# Patient Record
Sex: Male | Born: 1950
Health system: Southern US, Community
[De-identification: ages and names within clinical notes are randomized; demographics above are authoritative.]

## PROBLEM LIST (undated history)

## (undated) DIAGNOSIS — F1010 Alcohol abuse, uncomplicated: Secondary | ICD-10-CM

## (undated) DIAGNOSIS — S92302A Fracture of unspecified metatarsal bone(s), left foot, initial encounter for closed fracture: Secondary | ICD-10-CM

## (undated) DIAGNOSIS — I509 Heart failure, unspecified: Secondary | ICD-10-CM

## (undated) DIAGNOSIS — K219 Gastro-esophageal reflux disease without esophagitis: Secondary | ICD-10-CM

## (undated) DIAGNOSIS — L509 Urticaria, unspecified: Secondary | ICD-10-CM

## (undated) DIAGNOSIS — E662 Morbid (severe) obesity with alveolar hypoventilation: Secondary | ICD-10-CM

## (undated) DIAGNOSIS — K429 Umbilical hernia without obstruction or gangrene: Secondary | ICD-10-CM

## (undated) DIAGNOSIS — E538 Deficiency of other specified B group vitamins: Secondary | ICD-10-CM

## (undated) DIAGNOSIS — I4891 Unspecified atrial fibrillation: Secondary | ICD-10-CM

## (undated) DIAGNOSIS — I2781 Cor pulmonale (chronic): Secondary | ICD-10-CM

## (undated) DIAGNOSIS — I10 Essential (primary) hypertension: Secondary | ICD-10-CM

## (undated) DIAGNOSIS — J9611 Chronic respiratory failure with hypoxia: Secondary | ICD-10-CM

## (undated) DIAGNOSIS — M1990 Unspecified osteoarthritis, unspecified site: Secondary | ICD-10-CM

## (undated) DIAGNOSIS — S92402A Displaced unspecified fracture of left great toe, initial encounter for closed fracture: Secondary | ICD-10-CM

## (undated) HISTORY — PX: KNEE SURGERY: SHX244

## (undated) HISTORY — PX: CYSTOSCOPY: SUR368

## (undated) HISTORY — PX: CHOLECYSTECTOMY: SHX55

---

## 2001-05-24 ENCOUNTER — Emergency Department (HOSPITAL_COMMUNITY): Admission: EM | Admit: 2001-05-24 | Discharge: 2001-05-24 | Payer: Self-pay | Admitting: *Deleted

## 2001-12-16 ENCOUNTER — Observation Stay (HOSPITAL_COMMUNITY): Admission: RE | Admit: 2001-12-16 | Discharge: 2001-12-17 | Payer: Self-pay | Admitting: General Surgery

## 2003-11-29 ENCOUNTER — Ambulatory Visit (HOSPITAL_COMMUNITY): Admission: RE | Admit: 2003-11-29 | Discharge: 2003-11-29 | Payer: Self-pay | Admitting: Orthopedic Surgery

## 2007-11-03 ENCOUNTER — Ambulatory Visit: Payer: Self-pay | Admitting: Internal Medicine

## 2007-11-11 ENCOUNTER — Encounter: Payer: Self-pay | Admitting: Internal Medicine

## 2007-11-11 ENCOUNTER — Ambulatory Visit: Payer: Self-pay | Admitting: Internal Medicine

## 2007-11-11 ENCOUNTER — Ambulatory Visit (HOSPITAL_COMMUNITY): Admission: RE | Admit: 2007-11-11 | Discharge: 2007-11-11 | Payer: Self-pay | Admitting: Internal Medicine

## 2010-08-14 NOTE — Op Note (Signed)
NAME:  Frederick Brown, Frederick Brown               ACCOUNT NO.:  [id number]   MEDICAL RECORD NO.:  [id number]          PATIENT TYPE:  AMB   LOCATION:  DAY                           FACILITY:  [facility]   PHYSICIAN:  R. Roetta Sessions, M.D. DATE OF BIRTH:  1950/12/30   DATE OF PROCEDURE:  DATE OF DISCHARGE:                               OPERATIVE REPORT   INDICATIONS FOR PROCEDURE:  A 60 year old morbidly obese gentleman with  1-week history of increased belching, longstanding gastroesophageal  reflux disease symptoms taking omeprazole 20 mg orally daily, change in  bowel habits with some difficulty evacuating with some blood per rectum.  EGD and colonoscopy now being done.  This approach has been discussed  with the patient at length.  Risks, benefits, and alternatives have been  discussed.  Questions answered.  All parties agreeable.   PROCEDURE NOTE:  O2 saturation, blood pressure, pulse, and respirations  were monitored throughout the entire procedure.   CONSCIOUS SEDATION:  Versed 7 mg IV and Demerol 150 mg IV in divided  doses.   INSTRUMENT:  Pentax video chip system.   FINDINGS:  EGD examination tubular esophagus revealed distal esophageal  erosions in the setting of short segment of salmon-colored epithelium  coming up, fluid like, from the EG junction as much as 3 cm up.  Please  see photos.  There was no ring stricture or evidence of neoplasia.  Biopsies of the distal esophagus and the area of the salmon-colored  epithelium were taken for histologic study.  EG junction easily  traversed.  Stomach:  Gastric cavity was emptied, insufflated well with  air.  Thorough examination of the gastric mucosa including retroflexion  at the proximal stomach and esophagogastric junction demonstrated small  hiatal hernia and some milling polypoid antral folds of doubtful  clinical significance.  There was no ulcer or infiltrating process.  Pylorus patent and easily traversed.  Examination of the bulb and  second  portion revealed no abnormalities.   THERAPEUTIC/DIAGNOSTIC MANEUVERS PERFORMED:  As stated above, biopsies  of the salmon-colored epithelium were taken for histologic study.  The  patient was prepared for colonoscopy.  Digital rectal exam revealed no  abnormalities.  Endoscopic findings:  The prep was adequate.  Colon:  Colonic mucosa was surveyed from the rectosigmoid junction through the  left transverse, right colon, appendiceal orifice, ileocecal valve, and  cecum.  These structures were well seen and photographed for the record.  From this level, the scope was slowly and cautiously withdrawn.  All  previously mentioned mucosal surfaces were again seen.  In the base of  the cecum, there was 5-mm polyp which was cold snared, recovered through  the scope.  At the splenic flexure, there was a second similar-size  polyp which was also cold snared.  At 40 cm or mid sigmoid, there was a  ulcerated oozing 1.5 cm pedunculated polyp on a long stalk.  Please see  photos.  This was engaged with a snare and removed cleanly with 1 pass  of hot snare cautery.  This was recovered with a rotten head.  Scope was  reintroduced in  the rectum, advanced up to the polypectomy site, which  again looked really good.  From this level, scope was slowly withdrawn.  No other colonic mucosal site abnormalities were observed.  The scope  was pulled down into the rectum where thorough examination of rectal  mucosa including retroflexion and anteversion demonstrated no  abnormalities.  The patient tolerated the procedure well and was  reactive to endoscopy.   IMPRESSION:  1. EGD.  Salmon-colored epithelium, distal esophagus with leading edge      erosions consistent with erosive reflux esophagitis, probable short      segment Barrett's, status post biopsy.  2. Hiatal hernia.  Polypoid antral folds, doubtful clinical      significance, otherwise normal gastric mucosa.  Normal D1 and D2.  3. Colonoscopy  findings.  Normal rectum, multiple colonic polyps with      the largest being 40 cm (sigmoid) likely cause of the patient's      recent bleeding status post multiple snare polypectomies.   RECOMMENDATIONS:  1. Followup on path.  2. Stop omeprazole, begin Kapidex 60 mg orally daily.  The patient was      given by my office for some free samples.  3. Begin daily Metamucil or Citrucel fiber supplement.  4. Further recommendations to follow.      Jonathon Bellows, M.D.  Electronically Signed     RMR/MEDQ  D:  11/11/2007  T:  11/12/2007  Job:  [id number]   cc:   [name], M.D.  Fax: [phone]

## 2010-08-14 NOTE — H&P (Signed)
NAME:  Frederick Brown, Frederick Brown               ACCOUNT NO.:  [id number]   MEDICAL RECORD NO.:  [id number]          PATIENT TYPE:  AMB   LOCATION:  DAY                           FACILITY:  [facility]   PHYSICIAN:  Frederick Brown, M.D. DATE OF BIRTH:  [birth year]   DATE OF ADMISSION:  DATE OF DISCHARGE:  LH                              HISTORY & PHYSICAL   REFERRING PHYSICIAN:  [name], MD   REASON FOR CONSULTATION:  Reflux, belching, constipation, and  hematochezia.   HISTORY OF PRESENT ILLNESS:  [name] is a pleasant 60-year-  old morbidly obese Caucasian male sent over courtesy by Dr. Mila Brown to  evaluate a 47-month history of increased belching and sensation he is not  evacuating.  He states he has 1-2 bowel movements daily and starts to  have a good bowel movement and it just stops, he has trouble getting the  rest of the day and occasionally sees some blood per rectum and toilet  water with wiping.  He has not had any frank abdominal pain.  He has  reflux symptoms all the time, but they are all fairly well controlled on  omeprazole, which he requires daily.  He has had no symptoms for several  years.  No odynophagia, no dysphagia, early satiety, nausea, or  vomiting.  He has not had any melena.  He also takes Voltaren on a  regular basis for arthritis.  He states he feels more bloated when he  lies on the right side, better when he lays on the left sided at night.  He has never had either his upper or lower GI tract evaluated  previously.   He has a family history of GI neoplasia.   PAST MEDICAL HISTORY:  Significant for:  1. Gastroesophageal reflux disease.  2. Morbid obesity.  3. Osteoarthritis.   PAST SURGERIES:  1. Right knee surgery.  2. Cholecystectomy.   CURRENT MEDICATIONS:  1. Voltaren b.i.d.  2. Omeprazole 20 mg daily.  3. Tylenol Arthritis p.Frederickn.   ALLERGIES:  No known drug allergies.   FAMILY HISTORY:  Mother was succumbed to Frederick Brown spotted  fever at  age 26.  Father is alive in 75 and doing well.  No history of chronic GI  or liver illness.   SOCIAL HISTORY:  The patient is divorced.  He is unemployed at this  present time but has been an Insurance account manager.  No tobacco.  He  does drink beer or vodka 2-3 drinks daily.  No illicit drugs.   REVIEW OF SYSTEMS:  No chest pain and dyspnea on exertion.  No fever,  chills, or night sweats.   PHYSICAL EXAMINATION:  GENERAL:  Morbidly obese 60 year old gentleman.  VITAL SIGNS:  He weighs 475 per his report and was too large to weigh to  be weighed on our scales, height 5 feet 10, temperature 98.8, BP 122/82,  and pulse 96.  SKIN:  Warm and dry.  There is no jaundice.  No continuous stigmata of  chronic liver disease.  HEENT EXAM:  No scleral icterus.  Conjunctivae pink.  Oral cavity no  lesions.  CHEST:  Lungs are clear to auscultation.  CARDIAC:  Regular rate and rhythm without murmur, gallop, or rub.  ABDOMEN:  Nondistended.  Positive bowel sounds, soft.  No obvious masses  or organomegaly.  The abdomen is nontender to palpation.  EXTREMITIES:  He has trace bilateral lower extremity edema.  RECTAL EXAM:  Deferred without colonoscopy.   IMPRESSION:  [name] is a pleasant morbidly obese 60-year-  old gentleman with 67-month history of increased belching and background  setting of long-standing gastroesophageal reflux disease responsive to  once daily omeprazole 20 mg as well as he now has a change in bowel  habits characterized by difficulty evacuating and intermittent blood per  rectum.  I am striking by the fact, he has not had either his lower or  upper GI tract evaluated previously.  I told [name] symptoms are  somewhat nonspecific but no doubt he needs to go ahead and have his  colon checked right off to bed and in the same time, I offered him a  diagnostic EGD.  He is at increased risk for peptic ulcer disease with  ongoing therapy with Voltaren.   He needs to be screened for complicated  gastroesophageal reflux disease as well to this and we offered Mr.  Frederick Brown both an EGD and a colonoscopy in the hospital in the very near  future.  Risks, benefits, alternatives, and limitations have been  reviewed, questions answered.  He is agreeable.  We will set him up as  soon as it can be arranged.  Further recommendations to follow.   I would thank Dr.  for allowing me to see this nice gentleman in  consultations.      Frederick Brown, M.D.  Electronically Signed     RMR/MEDQ  D:  11/03/2007  T:  11/04/2007  Job:  [id number]

## 2010-08-17 NOTE — Op Note (Signed)
NAME:  Frederick Brown, Frederick Brown                         ACCOUNT NO.:  [id number]   MEDICAL RECORD NO.:  [id number]                   PATIENT TYPE:  OIB   LOCATION:  NA                                   FACILITY:  [facility]   PHYSICIAN:  Rodney A. Chaney Malling, M.D.           DATE OF BIRTH:  1951/02/05   DATE OF PROCEDURE:  11/29/2003  DATE OF DISCHARGE:                                 OPERATIVE REPORT   PREOPERATIVE DIAGNOSIS:  Osteoarthritis, right knee with degenerative tears,  medial and lateral meniscus, right knee and tear of anterior cruciate.   POSTOPERATIVE DIAGNOSIS:  Degenerative tears of posterior horn medial and  lateral meniscus; ruptured anterior cruciate ligament, right knee.  Significant tricompartment osteoarthritis, right knee.   OPERATION PERFORMED:  Arthroscopy; debridement medial and lateral meniscus,  right knee.   SURGEON:  Lenard Galloway. Chaney Malling, M.D.   ANESTHESIA:  General.   PATHOLOGY:  With the arthroscope in the knee, a very careful examination of  the knee was undertaken.  The patellofemoral joint was very difficult to  evaluate and the patient was morbidly obese, difficult to move the scope  around. However, good observation of medial and lateral compartments was  seen.  There was complete rupture of the ACL.  In the medial compartment,  there was fraying of the posterior third of the medial meniscus and there  was an area of total loss of articular cartilage over the posterior half of  the medial tibial plateau.  There is fraying and tearing of the posterior  horn as noted above.  On the opposite side of the contact area of the femur,  there was total loss of all articular cartilage with bone-on-bone  articulation.  In the lateral compartment there was fraying and tearing of  posterior horn of lateral meniscus and degenerative changes about the  femoral condyle and tibial plateau.   DESCRIPTION OF PROCEDURE:  The patient was placed on the operating table in  the  supine position.  The right leg was placed in a leg holder.  Entire  right lower extremity prepped with DuraPrep and draped out in the usual  manner.  A two portal technique was used.  The arthroscope was introduced  through the anteromedial and anterolateral portals.  Attention was first  turned to the intercondylar notch area.  There was complete disruption of  the ACL and this was debrided with the intra-articular shaver.  In the  medial compartment there was some fraying and tearing of the posterior horn  of the medial meniscus and through both portals, a series of baskets was  inserted and a torn portion of the meniscus was debrided.  This was followed  up with the intra-articular shaver and this was debrided.  In a similar  manner, attention was then turned to the lateral compartment.  There was  fraying and tearing of the posterior horn of the lateral meniscus.  This was  also  debrided with a series of baskets and this was followed with the intra-  articular shaver.  There was also marked fraying and tearing of articular  cartilage of the weightbearing area of the lateral femoral condyle and  lateral tibial plateau and this was also debrided.  A great deal of time was  spent looking through the knee.  Technically, this procedure went very well.   ADDENDUM:  Preoperatively, the patient understood he had severe  osteoarthritis of the knee and degenerative torn meniscus.  It was explained  that arthroscopic treatment of the osteoarthritis is not possible, but  hopefully debridement of the torn meniscus would give him some partial pain  relief.  The patient understands and accepted and wished to proceed with  surgery.  Questions answered and encouraged.   FOLLOW UP:  1.  Discharged home.  2.  Percocet for pain.  3.  Return to my office on Wednesday.  4.  I will call the patient tonight.  was prepped with DuraPrep and draped out in the usual manner.  A large bulky compressive  dressing was applied.  The patient was then  transferred to the recovery room in excellent condition.  Technically this  procedure went extremely well.   FOLLOW UP:  1.  See me in the office on .  2.  Vicodin for pain.                                               Rodney A. Chaney Malling, M.D.    RAM/MEDQ  D:  11/29/2003  T:  11/30/2003  Job:  [id number]

## 2010-08-17 NOTE — Op Note (Signed)
NAME:  Frederick Brown, Frederick Brown                         ACCOUNT NO.:  [id number]   MEDICAL RECORD NO.:  [id number]                   PATIENT TYPE:  AMB   LOCATION:  DAY                                  FACILITY:  [facility]   PHYSICIAN:  Dalia Heading, M.D.               DATE OF BIRTH:  02-Jan-1951   DATE OF PROCEDURE:  12/16/2001  DATE OF DISCHARGE:                                 OPERATIVE REPORT   PREOPERATIVE DIAGNOSES:  1. Cholecystitis.  2. Cholelithiasis.   POSTOPERATIVE DIAGNOSES:  1. Cholecystitis.  2. Cholelithiasis.   PROCEDURE:  Laparoscopic cholecystectomy.   SURGEON:  Dalia Heading, M.D.   ANESTHESIA:  General endotracheal.   INDICATIONS FOR PROCEDURE:  The patient is a 61 year old morbidly obese  white male who presents with cholecystitis secondary to cholelithiasis.  The  risks and benefits of the procedure including bleeding, infection,  cardiopulmonary difficulties, hepatobiliary injury, and the possibility of  an open procedure were fully explained to the patient, who gave informed  consent.   PROCEDURE:  The patient was placed in the supine position.  After induction  of general endotracheal anesthesia, the abdomen was prepped and draped using  the usual sterile technique with Betadine.   A supraumbilical incision was made down to the fascia.  A Veress needle was  introduced into the abdominal cavity and confirmation of placement was done  using the saline drop test.  The abdomen was then insufflated to 15 mmHg of  pressure.  An 11-mm trocar was introduced into the abdominal cavity under  direct visualization without difficulty.  The patient was placed in reverse  Trendelenburg position and an additional 1-mm trocar was placed in the  epigastric region and 5-mm trocars were placed in the right upper quadrant  and right flank regions.  The liver was inspected and noted to be within  normal limits.  The gallbladder was retracted superiorly and laterally.  The  dissection was begun around the infundibulum of the gallbladder.  The cystic  duct was first identified.  Its junction to the infundibulum fully  identified.  An Endo GIA was used to come across the cystic duct.  The  cystic artery was likewise ligated and divided.  The gallbladder was freed  away from the gallbladder fossa using Bovie electrocautery.  The gallbladder  was delivered through the epigastric trocar site using an EndoCatch bag.  The gallbladder fossa was inspected and no abnormal bleeding or bile leakage  was noted.  Several gallstones were spilled and cannot be retrieved.  Surgicel was placed in the gallbladder fossa.  The subhepatic space as well  as the right hepatic gutter irrigated with normal saline.  All fluid and air  were then evacuated from the abdominal cavity prior to removal of the  trocars.   All wounds were irrigated with normal saline.  All wounds were injected with  plain 5% Sensorcaine.  The supraumbilical  fascia was reapproximated using an  0 Vicryl interrupted suture.  All skin incisions were closed using staples.  Betadine ointment and dry sterile dressings were applied.   All tape and needle counts were correct at the end of the procedure.  The  patient was extubated in the operating room and went back to recovery room  awake and in stable condition.  Complications none. Specimen gallbladder  with stones.  Estimated blood loss minimal.                                                Dalia Heading, M.D.    MAJ/MEDQ  D:  12/16/2001  T:  12/16/2001  Job:  [id number]   cc:   [name], M.D.  [address]  [place]  [place] [address]  Fax: [phone]

## 2010-08-17 NOTE — H&P (Signed)
   NAME:  Frederick Brown, Frederick NO.:  Brown   MEDICAL RECORD NO.:  [id number]                  PATIENT TYPE:   LOCATION:                                       FACILITY:   PHYSICIAN:  Dalia Heading, M.D.               DATE OF BIRTH:  1950-06-08   DATE OF ADMISSION:  12/16/2001  DATE OF DISCHARGE:                                HISTORY & PHYSICAL   CHIEF COMPLAINT:  Cholecystitis secondary to cholelithiasis.   HISTORY OF PRESENT ILLNESS:  The patient is a 60 year old white male who is  referred for evaluation and treatment of cholecystitis secondary to  cholelithiasis.  He has been having indigestion and substernal chest pain.  He was admitted to [facility] last week for further workup.  Cardiac  workup was negative.  His liver enzyme tests were noted to be normal.  Ultrasound of the gallbladder was positive for cholelithiasis.  Hepatobiliary scan revealed no gallbladder filling.  As the patient was  asymptomatic, supposedly, he was discharged home.  No fever, chills, or  jaundice have been noted.  He is still having mild indigestion and reflux.   PAST MEDICAL HISTORY:  1. Morbid obesity.   PAST SURGICAL HISTORY:  Unremarkable.   CURRENT MEDICATIONS:  Voltaren and Protonix.   ALLERGIES:  No known drug allergies   REVIEW OF SYSTEMS:  The patient denies any cardiopulmonary difficulties or  sleep apnea.  He denies any bleeding disorders.   PHYSICAL EXAMINATION:  GENERAL:  Morbidly obese, 450-pound, white male in no  acute distress.  VITAL SIGNS:  Afebrile, and vital signs are stable.  HEENT:  No scleral icterus.  LUNGS:  Clear to auscultation with equal breath sounds bilaterally.  HEART:  Regular rate and rhythm.  Without S3, S4, or murmurs.  ABDOMEN:  Soft and nondistended.  Slightly tender in the right upper  quadrant to palpation.  No hepatosplenomegaly, masses, or hernias are  identified.   IMPRESSION:  1. Cholecystitis.  2.  Cholelithiasis.  3. Morbid obesity.    PLAN:  The patient is scheduled for laparoscopic cholecystectomy on  December 16, 2001.  The risks and benefits of the procedure including  bleeding, infection, hepatobiliary injury, poor wound healing,  cardiopulmonary difficulties, and the possibility of an open procedure were  fully explained to the patient, who gave informed consent.                                                  Dalia Heading, M.D.    MAJ/MEDQ  D:  12/10/2001  T:  12/11/2001  Job:  [id number]   cc:   Frederick Brown, M.D.  [address]  [place]  [place] [address]  Fax: [phone]

## 2011-05-03 DIAGNOSIS — I2781 Cor pulmonale (chronic): Secondary | ICD-10-CM

## 2011-05-03 DIAGNOSIS — E662 Morbid (severe) obesity with alveolar hypoventilation: Secondary | ICD-10-CM

## 2011-05-03 HISTORY — DX: Morbid (severe) obesity with alveolar hypoventilation: E66.2

## 2011-05-03 HISTORY — DX: Cor pulmonale (chronic): I27.81

## 2011-05-21 ENCOUNTER — Encounter (HOSPITAL_COMMUNITY): Payer: Self-pay | Admitting: *Deleted

## 2011-05-21 ENCOUNTER — Other Ambulatory Visit: Payer: Self-pay

## 2011-05-21 ENCOUNTER — Emergency Department (HOSPITAL_COMMUNITY): Payer: Self-pay

## 2011-05-21 ENCOUNTER — Inpatient Hospital Stay (HOSPITAL_COMMUNITY)
Admission: EM | Admit: 2011-05-21 | Discharge: 2011-05-31 | DRG: 292 | Disposition: A | Discharge status: Skilled Nursing Facility | Payer: Self-pay | Attending: Internal Medicine | Admitting: Internal Medicine

## 2011-05-21 DIAGNOSIS — K219 Gastro-esophageal reflux disease without esophagitis: Secondary | ICD-10-CM | POA: Diagnosis present

## 2011-05-21 DIAGNOSIS — I10 Essential (primary) hypertension: Secondary | ICD-10-CM | POA: Diagnosis present

## 2011-05-21 DIAGNOSIS — M109 Gout, unspecified: Secondary | ICD-10-CM | POA: Diagnosis present

## 2011-05-21 DIAGNOSIS — L500 Allergic urticaria: Secondary | ICD-10-CM | POA: Diagnosis not present

## 2011-05-21 DIAGNOSIS — I4891 Unspecified atrial fibrillation: Secondary | ICD-10-CM | POA: Diagnosis present

## 2011-05-21 DIAGNOSIS — B369 Superficial mycosis, unspecified: Secondary | ICD-10-CM | POA: Diagnosis present

## 2011-05-21 DIAGNOSIS — X58XXXA Exposure to other specified factors, initial encounter: Secondary | ICD-10-CM | POA: Diagnosis present

## 2011-05-21 DIAGNOSIS — J9610 Chronic respiratory failure, unspecified whether with hypoxia or hypercapnia: Secondary | ICD-10-CM | POA: Diagnosis present

## 2011-05-21 DIAGNOSIS — I5031 Acute diastolic (congestive) heart failure: Principal | ICD-10-CM | POA: Diagnosis present

## 2011-05-21 DIAGNOSIS — E871 Hypo-osmolality and hyponatremia: Secondary | ICD-10-CM | POA: Diagnosis present

## 2011-05-21 DIAGNOSIS — B372 Candidiasis of skin and nail: Secondary | ICD-10-CM | POA: Diagnosis present

## 2011-05-21 DIAGNOSIS — E662 Morbid (severe) obesity with alveolar hypoventilation: Secondary | ICD-10-CM | POA: Diagnosis present

## 2011-05-21 DIAGNOSIS — J9611 Chronic respiratory failure with hypoxia: Secondary | ICD-10-CM | POA: Diagnosis present

## 2011-05-21 DIAGNOSIS — M1990 Unspecified osteoarthritis, unspecified site: Secondary | ICD-10-CM | POA: Diagnosis present

## 2011-05-21 DIAGNOSIS — N390 Urinary tract infection, site not specified: Secondary | ICD-10-CM | POA: Diagnosis not present

## 2011-05-21 DIAGNOSIS — E538 Deficiency of other specified B group vitamins: Secondary | ICD-10-CM | POA: Diagnosis present

## 2011-05-21 DIAGNOSIS — I2781 Cor pulmonale (chronic): Secondary | ICD-10-CM | POA: Diagnosis present

## 2011-05-21 DIAGNOSIS — L509 Urticaria, unspecified: Secondary | ICD-10-CM | POA: Diagnosis not present

## 2011-05-21 DIAGNOSIS — T368X5A Adverse effect of other systemic antibiotics, initial encounter: Secondary | ICD-10-CM | POA: Diagnosis not present

## 2011-05-21 DIAGNOSIS — M79609 Pain in unspecified limb: Secondary | ICD-10-CM | POA: Diagnosis present

## 2011-05-21 DIAGNOSIS — Z79899 Other long term (current) drug therapy: Secondary | ICD-10-CM

## 2011-05-21 DIAGNOSIS — E669 Obesity, unspecified: Secondary | ICD-10-CM

## 2011-05-21 DIAGNOSIS — S92302A Fracture of unspecified metatarsal bone(s), left foot, initial encounter for closed fracture: Secondary | ICD-10-CM | POA: Diagnosis present

## 2011-05-21 DIAGNOSIS — S92402A Displaced unspecified fracture of left great toe, initial encounter for closed fracture: Secondary | ICD-10-CM | POA: Diagnosis present

## 2011-05-21 DIAGNOSIS — F1010 Alcohol abuse, uncomplicated: Secondary | ICD-10-CM | POA: Diagnosis present

## 2011-05-21 DIAGNOSIS — S92919A Unspecified fracture of unspecified toe(s), initial encounter for closed fracture: Secondary | ICD-10-CM | POA: Diagnosis present

## 2011-05-21 DIAGNOSIS — E559 Vitamin D deficiency, unspecified: Secondary | ICD-10-CM | POA: Diagnosis present

## 2011-05-21 DIAGNOSIS — R5381 Other malaise: Secondary | ICD-10-CM | POA: Diagnosis present

## 2011-05-21 DIAGNOSIS — R601 Generalized edema: Secondary | ICD-10-CM

## 2011-05-21 DIAGNOSIS — E873 Alkalosis: Secondary | ICD-10-CM | POA: Diagnosis not present

## 2011-05-21 DIAGNOSIS — G8929 Other chronic pain: Secondary | ICD-10-CM | POA: Diagnosis present

## 2011-05-21 DIAGNOSIS — S92309A Fracture of unspecified metatarsal bone(s), unspecified foot, initial encounter for closed fracture: Secondary | ICD-10-CM | POA: Diagnosis present

## 2011-05-21 DIAGNOSIS — J9801 Acute bronchospasm: Secondary | ICD-10-CM | POA: Diagnosis present

## 2011-05-21 DIAGNOSIS — D7589 Other specified diseases of blood and blood-forming organs: Secondary | ICD-10-CM | POA: Diagnosis present

## 2011-05-21 DIAGNOSIS — E870 Hyperosmolality and hypernatremia: Secondary | ICD-10-CM | POA: Diagnosis present

## 2011-05-21 DIAGNOSIS — I509 Heart failure, unspecified: Secondary | ICD-10-CM | POA: Diagnosis present

## 2011-05-21 DIAGNOSIS — Z6841 Body Mass Index (BMI) 40.0 and over, adult: Secondary | ICD-10-CM

## 2011-05-21 DIAGNOSIS — M549 Dorsalgia, unspecified: Secondary | ICD-10-CM | POA: Diagnosis present

## 2011-05-21 HISTORY — DX: Chronic respiratory failure with hypoxia: J96.11

## 2011-05-21 HISTORY — DX: Urticaria, unspecified: L50.9

## 2011-05-21 HISTORY — DX: Deficiency of other specified B group vitamins: E53.8

## 2011-05-21 HISTORY — DX: Displaced unspecified fracture of left great toe, initial encounter for closed fracture: S92.402A

## 2011-05-21 HISTORY — DX: Fracture of unspecified metatarsal bone(s), left foot, initial encounter for closed fracture: S92.302A

## 2011-05-21 HISTORY — DX: Morbid (severe) obesity with alveolar hypoventilation: E66.2

## 2011-05-21 HISTORY — DX: Morbid (severe) obesity due to excess calories: E66.01

## 2011-05-21 HISTORY — DX: Alcohol abuse, uncomplicated: F10.10

## 2011-05-21 HISTORY — DX: Unspecified osteoarthritis, unspecified site: M19.90

## 2011-05-21 HISTORY — DX: Essential (primary) hypertension: I10

## 2011-05-21 HISTORY — DX: Umbilical hernia without obstruction or gangrene: K42.9

## 2011-05-21 HISTORY — DX: Gastro-esophageal reflux disease without esophagitis: K21.9

## 2011-05-21 HISTORY — DX: Cor pulmonale (chronic): I27.81

## 2011-05-21 LAB — BASIC METABOLIC PANEL
Chloride: 107 mEq/L (ref 96–112)
Creatinine, Ser: 0.94 mg/dL (ref 0.50–1.35)
GFR calc Af Amer: >90 mL/min (ref >90)
GFR calc non Af Amer: 90 mL/min — ABNORMAL LOW (ref >90)
Potassium: 4.1 mEq/L (ref 3.5–5.1)

## 2011-05-21 LAB — PRO B NATRIURETIC PEPTIDE: Pro B Natriuretic peptide (BNP): 1490 pg/mL — ABNORMAL HIGH (ref 0–125)

## 2011-05-21 LAB — DIFFERENTIAL
Basophils Absolute: 0 10*3/uL (ref 0.0–0.1)
Basophils Relative: 1 % (ref 0–1)
Eosinophils Absolute: 0.1 10*3/uL (ref 0.0–0.7)
Neutro Abs: 4.1 10*3/uL (ref 1.7–7.7)
Neutrophils Relative %: 74 % (ref 43–77)

## 2011-05-21 LAB — CARDIAC PANEL(CRET KIN+CKTOT+MB+TROPI): Total CK: 112 U/L (ref 7–232)

## 2011-05-21 LAB — GLUCOSE, CAPILLARY: Glucose-Capillary: 94 mg/dL (ref 70–99)

## 2011-05-21 LAB — PROTIME-INR
INR: 0.99 (ref 0.00–1.49)
Prothrombin Time: 13.3 seconds (ref 11.6–15.2)

## 2011-05-21 LAB — CBC
MCHC: 31.2 g/dL (ref 30.0–36.0)
RDW: 16.4 % — ABNORMAL HIGH (ref 11.5–15.5)

## 2011-05-21 LAB — APTT: aPTT: 30 seconds (ref 24–37)

## 2011-05-21 MED ORDER — OXYCODONE HCL 5 MG PO TABS
5.0000 mg | ORAL_TABLET | ORAL | Status: DC | PRN
  Administered 2011-05-21 – 2011-05-23 (×3): 5 mg via ORAL
  Filled 2011-05-21 (×3): qty 1

## 2011-05-21 MED ORDER — FUROSEMIDE 40 MG PO TABS
40.0000 mg | ORAL_TABLET | Freq: Once | ORAL | Status: AC
  Administered 2011-05-21: 40 mg via ORAL
  Filled 2011-05-21: qty 1

## 2011-05-21 MED ORDER — VITAMIN B-1 100 MG PO TABS
100.0000 mg | ORAL_TABLET | Freq: Every day | ORAL | Status: DC
  Administered 2011-05-21 – 2011-05-31 (×11): 100 mg via ORAL
  Filled 2011-05-21 (×11): qty 1

## 2011-05-21 MED ORDER — ASPIRIN EC 81 MG PO TBEC
81.0000 mg | DELAYED_RELEASE_TABLET | Freq: Every day | ORAL | Status: DC
  Administered 2011-05-21 – 2011-05-31 (×11): 81 mg via ORAL
  Filled 2011-05-21 (×11): qty 1

## 2011-05-21 MED ORDER — NYSTATIN 100000 UNIT/GM EX POWD
Freq: Two times a day (BID) | CUTANEOUS | Status: DC
  Administered 2011-05-21 – 2011-05-31 (×19): via TOPICAL
  Filled 2011-05-21: qty 15

## 2011-05-21 MED ORDER — SODIUM CHLORIDE 0.9 % IJ SOLN
3.0000 mL | INTRAMUSCULAR | Status: DC | PRN
  Administered 2011-05-24 (×3): 3 mL via INTRAVENOUS
  Filled 2011-05-21 (×2): qty 3

## 2011-05-21 MED ORDER — PANTOPRAZOLE SODIUM 40 MG PO TBEC
40.0000 mg | DELAYED_RELEASE_TABLET | Freq: Every day | ORAL | Status: DC
  Administered 2011-05-21 – 2011-05-31 (×11): 40 mg via ORAL
  Filled 2011-05-21 (×12): qty 1

## 2011-05-21 MED ORDER — ENOXAPARIN SODIUM 40 MG/0.4ML ~~LOC~~ SOLN
40.0000 mg | SUBCUTANEOUS | Status: DC
  Administered 2011-05-21: 40 mg via SUBCUTANEOUS
  Filled 2011-05-21: qty 0.4

## 2011-05-21 MED ORDER — DILTIAZEM HCL 30 MG PO TABS
30.0000 mg | ORAL_TABLET | Freq: Four times a day (QID) | ORAL | Status: DC
  Administered 2011-05-21 – 2011-05-22 (×6): 30 mg via ORAL
  Filled 2011-05-21 (×6): qty 1

## 2011-05-21 MED ORDER — SODIUM CHLORIDE 0.9 % IV SOLN
250.0000 mL | INTRAVENOUS | Status: DC | PRN
  Administered 2011-05-30: 250 mL via INTRAVENOUS

## 2011-05-21 MED ORDER — ACETAMINOPHEN 325 MG PO TABS: 650.0000 mg | ORAL_TABLET | Freq: Four times a day (QID) | ORAL | Status: DC | PRN

## 2011-05-21 MED ORDER — LEVALBUTEROL HCL 1.25 MG/0.5ML IN NEBU
1.2500 mg | INHALATION_SOLUTION | Freq: Four times a day (QID) | RESPIRATORY_TRACT | Status: DC
  Administered 2011-05-21 – 2011-05-25 (×14): 1.25 mg via RESPIRATORY_TRACT
  Filled 2011-05-21 (×14): qty 0.5

## 2011-05-21 MED ORDER — LORAZEPAM 2 MG/ML IJ SOLN
0.5000 mg | INTRAMUSCULAR | Status: DC | PRN
  Administered 2011-05-22 – 2011-05-24 (×2): 0.5 mg via INTRAVENOUS
  Filled 2011-05-21 (×2): qty 1

## 2011-05-21 MED ORDER — NYSTATIN 100000 UNIT/GM EX POWD
CUTANEOUS | Status: AC
  Filled 2011-05-21: qty 15

## 2011-05-21 MED ORDER — ADULT MULTIVITAMIN W/MINERALS CH
1.0000 | ORAL_TABLET | Freq: Every day | ORAL | Status: DC
  Administered 2011-05-21: 1 via ORAL
  Administered 2011-05-22: 11:00:00 via ORAL
  Administered 2011-05-23 – 2011-05-26 (×4): 1 via ORAL
  Administered 2011-05-27: 18:00:00 via ORAL
  Administered 2011-05-28: 1 via ORAL
  Administered 2011-05-28: 11:00:00 via ORAL
  Administered 2011-05-29 – 2011-05-31 (×3): 1 via ORAL
  Filled 2011-05-21 (×12): qty 1

## 2011-05-21 MED ORDER — FUROSEMIDE 10 MG/ML IJ SOLN
40.0000 mg | Freq: Two times a day (BID) | INTRAMUSCULAR | Status: DC
  Administered 2011-05-21 – 2011-05-25 (×7): 40 mg via INTRAVENOUS
  Filled 2011-05-21 (×8): qty 4

## 2011-05-21 MED ORDER — GUAIFENESIN-DM 100-10 MG/5ML PO SYRP
5.0000 mL | ORAL_SOLUTION | ORAL | Status: DC | PRN
  Administered 2011-05-26 (×2): 5 mL via ORAL
  Filled 2011-05-21 (×2): qty 5

## 2011-05-21 MED ORDER — MORPHINE SULFATE 2 MG/ML IJ SOLN
2.0000 mg | INTRAMUSCULAR | Status: DC | PRN
  Filled 2011-05-21: qty 1

## 2011-05-21 MED ORDER — SODIUM CHLORIDE 0.9 % IJ SOLN
3.0000 mL | Freq: Two times a day (BID) | INTRAMUSCULAR | Status: DC
  Administered 2011-05-22 – 2011-05-31 (×17): 3 mL via INTRAVENOUS
  Filled 2011-05-21 (×20): qty 3

## 2011-05-21 MED ORDER — ACETAMINOPHEN 650 MG RE SUPP: 650.0000 mg | Freq: Four times a day (QID) | RECTAL | Status: DC | PRN

## 2011-05-21 NOTE — ED Notes (Signed)
Attempted to call report on pt, staff will return call

## 2011-05-21 NOTE — ED Notes (Signed)
Pt states SOB and swelling to lower extremity has gradually gotten worse over past 3 weeks.

## 2011-05-21 NOTE — ED Provider Notes (Signed)
History     CSN: [id number]  Arrival date & time 05/21/11  1203   First MD Initiated Contact with Patient 05/21/11 1339      Chief Complaint  Patient presents with  . Shortness of Breath    (Consider location/radiation/quality/duration/timing/severity/associated sxs/prior treatment) Patient is a 61 y.o. male presenting with shortness of breath. The history is provided by the patient (the patient complains of some shortness of breath with swelling in his abdomen and legs for a  couple weeks now). No language interpreter was used.  Shortness of Breath  The current episode started more than 2 weeks ago. The problem occurs continuously. The problem has been unchanged. The problem is moderate. The symptoms are relieved by nothing. The symptoms are aggravated by nothing. Associated symptoms include shortness of breath. Pertinent negatives include no chest pain, no cough and no wheezing. He has had no prior steroid use. His past medical history does not include bronchiolitis. He has been behaving normally. Urine output has been normal.    Past Medical History  Diagnosis Date  . Hypertension     Past Surgical History  Procedure Date  . Cholecystectomy   . Knee surgery     No family history on file.  History  Substance Use Topics  . Smoking status: Never Smoker   . Smokeless tobacco: Not on file  . Alcohol Use: Yes     2-3 liquor drinks daily      Review of Systems  Constitutional: Negative for fatigue.  HENT: Negative for congestion, sinus pressure and ear discharge.   Eyes: Negative for discharge.  Respiratory: Positive for shortness of breath. Negative for cough and wheezing.   Cardiovascular: Negative for chest pain.  Gastrointestinal: Negative for abdominal pain and diarrhea.  Genitourinary: Negative for frequency and hematuria.  Musculoskeletal: Negative for back pain.       Swelling in legs  Skin: Negative for rash.  Neurological: Negative for seizures and  headaches.  Hematological: Negative.   Psychiatric/Behavioral: Negative for hallucinations.    Allergies  Review of patient's allergies indicates no known allergies.  Home Medications   Current Outpatient Rx  Name Route Sig Dispense Refill  . NAPROXEN SODIUM 220 MG PO TABS Oral Take 220 mg by mouth every 8 (eight) hours as needed. For pain    . OMEPRAZOLE 20 MG PO CPDR Oral Take 20 mg by mouth daily.      BP 116/75  Pulse 93  Temp 99.1 F (37.3 C)  Resp 24  Ht 5\' 10"  (1.778 m)  Wt 591 lb (268.076 kg)  BMI 84.80 kg/m2  SpO2 94%  Physical Exam  Constitutional: He is oriented to person, place, and time. He appears well-developed.  HENT:  Head: Normocephalic and atraumatic.  Eyes: Conjunctivae and EOM are normal. No scleral icterus.  Neck: Neck supple. No thyromegaly present.  Cardiovascular: Normal rate and regular rhythm.  Exam reveals no gallop and no friction rub.   No murmur heard. Pulmonary/Chest: No stridor. He has no wheezes. He has no rales. He exhibits no tenderness.  Abdominal: He exhibits no distension. There is no tenderness. There is no rebound.       Fluid in abdomen.    Musculoskeletal: Normal range of motion. He exhibits edema.       Swelling in both legs.   Lymphadenopathy:    He has no cervical adenopathy.  Neurological: He is oriented to person, place, and time. Coordination normal.  Skin: No rash noted. No erythema.  Psychiatric:  He has a normal mood and affect. His behavior is normal.    ED Course  Procedures (including critical care time)  Labs Reviewed  CBC - Abnormal; Notable for the following:    MCV 107.7 (*)    RDW 16.4 (*)    All other components within normal limits  BASIC METABOLIC PANEL - Abnormal; Notable for the following:    Sodium 146 (*)    Glucose, Bld 109 (*)    GFR calc non Af Amer 90 (*)    All other components within normal limits  PRO B NATRIURETIC PEPTIDE - Abnormal; Notable for the following:    Pro B Natriuretic  peptide (BNP) 1490.0 (*)    All other components within normal limits  DIFFERENTIAL  GLUCOSE, CAPILLARY  URINALYSIS, ROUTINE W REFLEX MICROSCOPIC  URINE CULTURE   Dg Chest Portable 1 View  05/21/2011  *RADIOLOGY REPORT*  Clinical Data: Shortness of breath  PORTABLE CHEST - 1 VIEW  Comparison: None.  Findings: There is cardiomegaly present with probable pulmonary vascular congestion.  No focal infiltrate is seen and no definite effusion is noted.  No bony abnormality is seen.  IMPRESSION: Cardiomegaly and probable moderate pulmonary vascular congestion. Recommend two-view chest x-ray.  Original Report Authenticated By: Juline Patch, M.D.     1. Anasarca   2. Obesity     Date: 05/21/2011  [id number]  Rhythm: atrial fibrillation  QRS Axis: normal  Intervals: normal  ST/T Wave abnormalities: nonspecific ST changes  Conduction Disutrbances:none  Narrative Interpretation:   Old EKG Reviewed: changes noted     MDM          Benny Lennert, MD 05/21/11 1521

## 2011-05-21 NOTE — Discharge Summary (Signed)
Frederick Brown MRN: [id number] DOB/AGE: 1950-11-24 61 y.o. Primary Care Physician:MCGOUGH,WILLIAM M, MD, MD Admit date: 05/21/2011 Chief Complaint: Shortness of breath HPI: The patient is a 61 year old man with a past medical history significant for morbid obesity, hypertension, degenerative joint disease, and GERD, who presents to the emergency department today with a chief complaint of shortness of breath. His shortness of breath started a little over a month ago. It has been progressive. He had been having shortness of breath with activity, however, now he is having shortness of breath at rest as well. He is unable to lay flat in bed. He tries to sleep with several pillows or lay on his side for comfort and ease of breathing. Lately, this has not helped as he continues to be short of breath. Approximately 2 weeks ago, he had what he says was probably bronchitis with a cough, chest congestion, and wheezing. He still has some residual wheezing. He has also noticed more swelling in his abdomen and early satiety. He has had some swelling in his legs as well. He denies chest pain. He denies pleurisy. He denies associated fever or chills. He denies nausea and vomiting. He denies diarrhea. His urine output has decreased over the past few days. He is also had some urinary incontinence. He acknowledges drinking 5-6 alcoholic beverages daily, but over the past 5 days, he denies alcohol use. He denies any residual tremor or withdrawal symptoms.  In the emergency department, he is noted to be afebrile and hemodynamically stable. His blood pressure is ranging from 116/75-165/101. He is oxygenating 92% on nasal cannula oxygen. His lab data are significant for a serum sodium of 146, pro BNP of 1490, and MCV of 107.7. His EKG reveals atrial fibrillation with a heart rate of 97 beats per minute and low voltage QRS. His chest x-ray reveals cardiomegaly and moderate pulmonary vascular congestion. He is being admitted for  further evaluation and management.  Past Medical History  Diagnosis Date  . Hypertension   . Obesity, morbid (more than 100 lbs over ideal weight or BMI > 40)   . Degenerative joint disease   . Alcohol abuse   . Umbilical hernia   . Gastroesophageal reflux disease     Past Surgical History  Procedure Date  . Cholecystectomy   . Knee surgery     Prior to Admission medications   Medication Sig Start Date End Date Taking? Authorizing Provider  naproxen sodium (ANAPROX) 220 MG tablet Take 220 mg by mouth every 8 (eight) hours as needed. For pain   Yes Historical Provider, MD  omeprazole (PRILOSEC) 20 MG capsule Take 20 mg by mouth daily.   Yes Historical Provider, MD    Allergies: No Known Allergies  Family history: His father is 60 years of age and has a history of coronary artery disease, status post CABG. His mother died of complications of [place] spotted fever at 60 years of age.   Social History: He is divorced. He lives alone in [place]. His niece Raynelle Fanning is a Psychologist, sport and exercise at [employer]. She reports that his home/living environment and hygiene are terrible. Up until a few weeks ago, he was ambulating with a walker. Now he is too short of breath to ambulate. He has been unable to drive as well over the past few weeks. He denies illicit drug use and tobacco use. However, he acknowledges drinking 3-416 ounce alcoholic beverages with either vodka or bourbon daily. He denies drinking any alcohol over the past  5 days.       ROS: Positive for chronic pain in his back, chronic pain in his legs, increasing satiety, increasing body swelling, increase pant size by 2 sizes over the past year, wheezing, and difficulty ambulating because of shortness of breath. He denies tremor or withdrawal symptoms. Otherwise review of systems is negative.   PHYSICAL EXAM: Blood pressure 134/87, pulse 105, temperature 98.4 F (36.9 C), temperature source Oral, resp. rate 20, height 5\' 10"   (1.778 m), weight 265.762 kg (585 lb 14.4 oz), SpO2 92.00%.  General: Pleasant but severely morbidly obese Caucasian man lying in bed in no acute distress. HEENT: Head is normocephalic, nontraumatic. Pupils are equal, round, and reactive to light. Extraocular movements are intact. Conjunctivae are clear. Sclerae are white. Oropharynx reveals mildly dry mucous membranes. No posterior exudates or erythema. Dentition is fairly good. Tympanic membranes not examined. Neck: Obese, no adenopathy, no thyromegaly. Lungs: Bilateral fine wheezes in the upper lobes with decreased breath sounds elsewhere. Mildly cachectic when speaking. Heart: Distant irregular irregular. Abdomen: Morbidly obese, positive bowel sounds, question fluid wave, mild erythema and induration of the morbidly large pannus. Umbilical hernia noted with no appreciable tenderness. Reducible. Mild epigastric tenderness. GU: Foley catheter inserted with light yellow urine draining. Sacrum: Difficult to examine but grossly, no severe ulcers noted. There is mild erythema around his buttocks and perineum. Extremities: 1+ nonpitting edema of the lower extremities bilaterally. Pedal pulses barely palpable. Neurologic: He is alert and oriented x3. Cranial nerves II through XII are intact. No signs of tremulousness.   Basic Metabolic Panel:  Basename 05/21/11 1242  NA 146*  K 4.1  CL 107  CO2 32  GLUCOSE 109*  BUN 13  CREATININE 0.94  CALCIUM 10.3  MG --  PHOS --   Liver Function Tests: No results found for this basename: AST:2,ALT:2,ALKPHOS:2,BILITOT:2,PROT:2,ALBUMIN:2 in the last 72 hours No results found for this basename: LIPASE:2,AMYLASE:2 in the last 72 hours No results found for this basename: AMMONIA:2 in the last 72 hours CBC:  Basename 05/21/11 1242  WBC 5.6  NEUTROABS 4.1  HGB 14.8  HCT 47.4  MCV 107.7*  PLT 179   Cardiac Enzymes: No results found for this basename: CKTOTAL:3,CKMB:3,CKMBINDEX:3,TROPONINI:3 in the  last 72 hours BNP:  Basename 05/21/11 1242  PROBNP 1490.0*   D-Dimer: No results found for this basename: DDIMER:2 in the last 72 hours CBG:  Basename 05/21/11 1217  GLUCAP 94   Hemoglobin A1C: No results found for this basename: HGBA1C in the last 72 hours Fasting Lipid Panel: No results found for this basename: CHOL,HDL,LDLCALC,TRIG,CHOLHDL,LDLDIRECT in the last 72 hours Thyroid Function Tests: No results found for this basename: TSH,T4TOTAL,FREET4,T3FREE,THYROIDAB in the last 72 hours Anemia Panel: No results found for this basename: VITAMINB12,FOLATE,FERRITIN,TIBC,IRON,RETICCTPCT in the last 72 hours Coagulation: No results found for this basename: LABPROT:2,INR:2 in the last 72 hours Urine Drug Screen: Drugs of Abuse  No results found for this basename: labopia, cocainscrnur, labbenz, amphetmu, thcu, labbarb    Alcohol Level: No results found for this basename: ETH:2 in the last 72 hours Urinalysis: No results found for this basename: COLORURINE:2,APPERANCEUR:2,LABSPEC:2,PHURINE:2,GLUCOSEU:2,HGBUR:2,BILIRUBINUR:2,KETONESUR:2,PROTEINUR:2,UROBILINOGEN:2,NITRITE:2,LEUKOCYTESUR:2 in the last 72 hours Misc. Labs:   EKG: atrial fibrillation with a heart rate of 97 beats per minute and low voltage QRS.   No results found for this or any previous visit (from the past 240 hour(s)).   Results for orders placed during the hospital encounter of 05/21/11 (from the past 48 hour(s))  GLUCOSE, CAPILLARY     Status: Normal  Collection Time   05/21/11 12:17 PM      Component Value Range Comment   Glucose-Capillary 94  70 - 99 (mg/dL)   CBC     Status: Abnormal   Collection Time   05/21/11 12:42 PM      Component Value Range Comment   WBC 5.6  4.0 - 10.5 (K/uL)    RBC 4.40  4.22 - 5.81 (MIL/uL)    Hemoglobin 14.8  13.0 - 17.0 (g/dL)    HCT 16.1  09.6 - 04.5 (%)    MCV 107.7 (*) 78.0 - 100.0 (fL)    MCH 33.6  26.0 - 34.0 (pg)    MCHC 31.2  30.0 - 36.0 (g/dL)    RDW 40.9 (*)  81.1 - 15.5 (%)    Platelets 179  150 - 400 (K/uL)   DIFFERENTIAL     Status: Normal   Collection Time   05/21/11 12:42 PM      Component Value Range Comment   Neutrophils Relative 74  43 - 77 (%)    Neutro Abs 4.1  1.7 - 7.7 (K/uL)    Lymphocytes Relative 16  12 - 46 (%)    Lymphs Abs 0.9  0.7 - 4.0 (K/uL)    Monocytes Relative 9  3 - 12 (%)    Monocytes Absolute 0.5  0.1 - 1.0 (K/uL)    Eosinophils Relative 2  0 - 5 (%)    Eosinophils Absolute 0.1  0.0 - 0.7 (K/uL)    Basophils Relative 1  0 - 1 (%)    Basophils Absolute 0.0  0.0 - 0.1 (K/uL)   BASIC METABOLIC PANEL     Status: Abnormal   Collection Time   05/21/11 12:42 PM      Component Value Range Comment   Sodium 146 (*) 135 - 145 (mEq/L)    Potassium 4.1  3.5 - 5.1 (mEq/L)    Chloride 107  96 - 112 (mEq/L)    CO2 32  19 - 32 (mEq/L)    Glucose, Bld 109 (*) 70 - 99 (mg/dL)    BUN 13  6 - 23 (mg/dL)    Creatinine, Ser 9.14  0.50 - 1.35 (mg/dL)    Calcium 78.2  8.4 - 10.5 (mg/dL)    GFR calc non Af Amer 90 (*) >90 (mL/min)    GFR calc Af Amer >90  >90 (mL/min)   PRO B NATRIURETIC PEPTIDE     Status: Abnormal   Collection Time   05/21/11 12:42 PM      Component Value Range Comment   Pro B Natriuretic peptide (BNP) 1490.0 (*) 0 - 125 (pg/mL)     Dg Chest Portable 1 View  05/21/2011  *RADIOLOGY REPORT*  Clinical Data: Shortness of breath  PORTABLE CHEST - 1 VIEW  Comparison: None.  Findings: There is cardiomegaly present with probable pulmonary vascular congestion.  No focal infiltrate is seen and no definite effusion is noted.  No bony abnormality is seen.  IMPRESSION: Cardiomegaly and probable moderate pulmonary vascular congestion. Recommend two-view chest x-ray.  Original Report Authenticated By: Juline Patch, M.D.    Impression:  Active Problems:  Congestive heart failure  Bronchospasm  Atrial fibrillation  And  Superficial fungal infection of skin  Hypernatremia  Macrocytosis  Alcohol abuse  1.  Dyspnea/shortness of breath. This is presumed to be secondary to congestive heart failure either systolic, diastolic, or both. He has no prior history of congestive heart failure or coronary artery disease, but  he does have a family history of coronary artery disease. His symptomatology may also be secondary to residual bronchitis.  Newly diagnosed atrial fibrillation. His heart rate is below 100 now but it was ranging in the 100s in the emergency department.  Alcohol abuse. The patient drinks on a daily basis, however, he reports that he has not drunk any alcohol in 5 days. There appears to be no signs or symptoms consistent with  alcohol withdrawal syndrome.  Macrocytosis. This is likely secondary to alcohol abuse. Folate deficiency and vitamin B 12 deficiency will be ruled out.  Hypernatremia. Possible dehydration in the setting of whole body edema.  Perineal/buttock superficial fungal infection.  Super morbid obesity. His weight is approximately 600 pounds.      Plan:  1. The patient was given 40 mg of Lasix IV in the emergency department. We'll continue 40 mg IV every 12 hours. Strict ins and outs and daily weights.  2. Will start aspirin therapy and prophylactic Lovenox. Will start Cardizem 30 mg 4 times a day.  3. We'll consult cardiology in the morning for assistance with congestive heart failure management and atrial fibrillation management.  4. Will start vitamin therapy with thiamine and multivitamin with iron. We'll also order when necessary Ativan. Will hold off on the official CIWA protocol. Social work consult for alcohol abuse.   5.Nutrition consult for morbid obesity.  6. Will start nystatin powder twice a day to the perineum and buttocks.  7. Xopenex for bronchospasms.  8. For further evaluation, we'll order PT, PTT, cardiac enzymes, TSH, free T4, hemoglobin A1c, and 2-D echocardiogram.       Danyah Guastella 05/21/2011, 5:30 PM

## 2011-05-21 NOTE — ED Notes (Signed)
Pt has been resting in bed, assisted with movement in bed,   md has been in exam room and plan of care discussed.  Awaiting admitting md.  Family is also at bedside.

## 2011-05-21 NOTE — ED Notes (Signed)
Attempted to call report for 2nd time,

## 2011-05-21 NOTE — ED Notes (Signed)
Pt arrived by ems from home.  Stated he has been having some bil leg swelling for several days.  Cough at times, sob at times.  Family reports that pt is not able to take care of self very well, drinking etoh daily.  They are worried for his safety, pt doesn't follow up with md as needed. Pt is nearly 600 pounds.  Uses walker to ambulate

## 2011-05-22 ENCOUNTER — Encounter (HOSPITAL_COMMUNITY): Payer: Self-pay | Admitting: Adult Health

## 2011-05-22 DIAGNOSIS — I509 Heart failure, unspecified: Secondary | ICD-10-CM

## 2011-05-22 DIAGNOSIS — I4891 Unspecified atrial fibrillation: Secondary | ICD-10-CM

## 2011-05-22 DIAGNOSIS — F1010 Alcohol abuse, uncomplicated: Secondary | ICD-10-CM

## 2011-05-22 DIAGNOSIS — E538 Deficiency of other specified B group vitamins: Secondary | ICD-10-CM

## 2011-05-22 DIAGNOSIS — I369 Nonrheumatic tricuspid valve disorder, unspecified: Secondary | ICD-10-CM

## 2011-05-22 HISTORY — DX: Deficiency of other specified B group vitamins: E53.8

## 2011-05-22 LAB — CBC
MCH: 33.6 pg (ref 26.0–34.0)
MCHC: 30.9 g/dL (ref 30.0–36.0)
MCV: 108.5 fL — ABNORMAL HIGH (ref 78.0–100.0)
Platelets: 191 10*3/uL (ref 150–400)
RBC: 4.35 MIL/uL (ref 4.22–5.81)

## 2011-05-22 LAB — COMPREHENSIVE METABOLIC PANEL
ALT: 18 U/L (ref 0–53)
AST: 31 U/L (ref 0–37)
Albumin: 3.3 g/dL — ABNORMAL LOW (ref 3.5–5.2)
CO2: 33 mEq/L — ABNORMAL HIGH (ref 19–32)
Chloride: 105 mEq/L (ref 96–112)
Creatinine, Ser: 0.96 mg/dL (ref 0.50–1.35)
GFR calc non Af Amer: 89 mL/min — ABNORMAL LOW (ref >90)
Potassium: 4 mEq/L (ref 3.5–5.1)
Sodium: 146 mEq/L — ABNORMAL HIGH (ref 135–145)
Total Bilirubin: 1.1 mg/dL (ref 0.3–1.2)

## 2011-05-22 LAB — TSH: TSH: 1.3 u[IU]/mL (ref 0.350–4.500)

## 2011-05-22 LAB — CARDIAC PANEL(CRET KIN+CKTOT+MB+TROPI)
CK, MB: 1.9 ng/mL (ref 0.3–4.0)
Relative Index: 2.1 (ref 0.0–2.5)
Relative Index: INVALID (ref 0.0–2.5)
Troponin I: <0.3 ng/mL (ref <0.30)
Troponin I: <0.3 ng/mL (ref <0.30)

## 2011-05-22 LAB — HEMOGLOBIN A1C
Hgb A1c MFr Bld: 5.6 % (ref <5.7)
Mean Plasma Glucose: 114 mg/dL (ref <117)

## 2011-05-22 LAB — T4, FREE: Free T4: 1.18 ng/dL (ref 0.80–1.80)

## 2011-05-22 MED ORDER — SODIUM CHLORIDE 0.9 % IJ SOLN
INTRAMUSCULAR | Status: AC
  Filled 2011-05-22: qty 3

## 2011-05-22 MED ORDER — LISINOPRIL 5 MG PO TABS
5.0000 mg | ORAL_TABLET | Freq: Every day | ORAL | Status: DC
  Administered 2011-05-22 – 2011-05-31 (×10): 5 mg via ORAL
  Filled 2011-05-22 (×10): qty 1

## 2011-05-22 MED ORDER — ENOXAPARIN SODIUM 60 MG/0.6ML ~~LOC~~ SOLN
130.0000 mg | SUBCUTANEOUS | Status: DC
  Administered 2011-05-22 – 2011-05-30 (×9): 130 mg via SUBCUTANEOUS
  Filled 2011-05-22: qty 0.3
  Filled 2011-05-22: qty 0.6
  Filled 2011-05-22: qty 0.4
  Filled 2011-05-22: qty 1.5
  Filled 2011-05-22: qty 0.3
  Filled 2011-05-22: qty 0.6
  Filled 2011-05-22: qty 0.4
  Filled 2011-05-22 (×4): qty 0.3

## 2011-05-22 MED ORDER — CYANOCOBALAMIN 1000 MCG/ML IJ SOLN
1000.0000 ug | Freq: Every day | INTRAMUSCULAR | Status: AC
  Administered 2011-05-22 – 2011-05-26 (×5): 1000 ug via INTRAMUSCULAR
  Filled 2011-05-22 (×5): qty 1

## 2011-05-22 NOTE — Progress Notes (Signed)
*  PRELIMINARY RESULTS* Echocardiogram 2D Echocardiogram has been performed.  Conrad Jud 05/22/2011, 11:59 AM

## 2011-05-22 NOTE — Progress Notes (Signed)
Subjective: He didn't get much sleep last night, however, he believes that he is feeling better. He has less abdominal fullness and less early satiety. He denies any tremulousness or nervousness. He denies chest pain. He is still a little short of breath at rest.  Objective: Vital signs in last 24 hours: Filed Vitals:   05/21/11 2127 05/22/11 0122 05/22/11 0643 05/22/11 0855  BP: 124/70  153/90   Pulse: 99  106   Temp: 99.2 F (37.3 C)  98.1 F (36.7 C)   TempSrc: Oral  Oral   Resp: 20  21   Height:      Weight:   260.682 kg (574 lb 11.2 oz)   SpO2: 98% 92% 92% 92%    Intake/Output Summary (Last 24 hours) at 05/22/11 0956 Last data filed at 05/22/11 0924  Gross per 24 hour  Intake   1180 ml  Output   6375 ml  Net  -5195 ml    Weight change:   Physical exam: Lungs: A few upper airway expiratory wheezes. Heart: Distant irregular irregular Abdomen: Morbidly obese, positive bowel sounds, nontender. Question fluid wave. Extremities: 1+ bilateral lower extremity  edema. Psychological/neurological. The patient is alert and oriented x3. Cranial nerves II through XII are intact. His speech is clear. He is not tremulous.  Lab Results: Basic Metabolic Panel:  Basename 05/22/11 0552 05/21/11 1242  NA 146* 146*  K 4.0 4.1  CL 105 107  CO2 33* 32  GLUCOSE 113* 109*  BUN 11 13  CREATININE 0.96 0.94  CALCIUM 10.3 10.3  MG -- --  PHOS -- --   Liver Function Tests:  Basename 05/22/11 0552  AST 31  ALT 18  ALKPHOS 123*  BILITOT 1.1  PROT 6.5  ALBUMIN 3.3*   No results found for this basename: LIPASE:2,AMYLASE:2 in the last 72 hours No results found for this basename: AMMONIA:2 in the last 72 hours CBC:  Basename 05/22/11 0552 05/21/11 1242  WBC 6.6 5.6  NEUTROABS -- 4.1  HGB 14.6 14.8  HCT 47.2 47.4  MCV 108.5* 107.7*  PLT 191 179   Cardiac Enzymes:  Basename 05/22/11 0915 05/22/11 0118 05/21/11 1849  CKTOTAL 78 100 112  CKMB 1.9 2.1 2.5  CKMBINDEX -- -- --    TROPONINI <0.30 <0.30 <0.30   BNP:  Basename 05/21/11 1242  PROBNP 1490.0*   D-Dimer: No results found for this basename: DDIMER:2 in the last 72 hours CBG:  Basename 05/21/11 1217  GLUCAP 94   Hemoglobin A1C: No results found for this basename: HGBA1C in the last 72 hours Fasting Lipid Panel: No results found for this basename: CHOL,HDL,LDLCALC,TRIG,CHOLHDL,LDLDIRECT in the last 72 hours Thyroid Function Tests:  Basename 05/21/11 1242  TSH 1.300  T4TOTAL --  FREET4 1.18  T3FREE --  THYROIDAB --   Anemia Panel:  Basename 05/21/11 1242  VITAMINB12 161*  FOLATE 6.5  FERRITIN --  TIBC --  IRON --  RETICCTPCT --   Coagulation:  Basename 05/21/11 1242  LABPROT 13.3  INR 0.99   Urine Drug Screen: Drugs of Abuse  No results found for this basename: labopia,  cocainscrnur,  labbenz,  amphetmu,  thcu,  labbarb    Alcohol Level: No results found for this basename: ETH:2 in the last 72 hours Urinalysis: No results found for this basename: COLORURINE:2,APPERANCEUR:2,LABSPEC:2,PHURINE:2,GLUCOSEU:2,HGBUR:2,BILIRUBINUR:2,KETONESUR:2,PROTEINUR:2,UROBILINOGEN:2,NITRITE:2,LEUKOCYTESUR:2 in the last 72 hours Misc. Labs:   Micro: No results found for this or any previous visit (from the past 240 hour(s)).  Studies/Results: Dg Chest Portable 1 View  05/21/2011  *RADIOLOGY REPORT*  Clinical Data: Shortness of breath  PORTABLE CHEST - 1 VIEW  Comparison: None.  Findings: There is cardiomegaly present with probable pulmonary vascular congestion.  No focal infiltrate is seen and no definite effusion is noted.  No bony abnormality is seen.  IMPRESSION: Cardiomegaly and probable moderate pulmonary vascular congestion. Recommend two-view chest x-ray.  Original Report Authenticated By: Juline Patch, M.D.    Medications: I have reviewed the patient's current medications.  Assessment: Active Problems:  Congestive heart failure  Bronchospasm  Atrial fibrillation  Superficial  fungal infection of skin  Hypernatremia  Macrocytosis  Alcohol abuse  Vitamin B12 deficiency   1. Acute congestive heart failure. 2-D echocardiogram has been ordered to determine if this is left heart or right heart or combined heart failure. He is diuresing well on Lasix. His cardiac enzymes are negative. His thyroid function is within normal limits. Cardiology has seen the patient. Their assessment and recommendations are noted and appreciated. Lisinopril was added for better blood pressure control.  Newly diagnosed atrial fibrillation. Oral Cardizem and aspirin were started. Starting Coumadin hasn't been confirmed yet per cardiology. Certainly, in this patient, anticoagulation would be problematic. We'll await the results of the 2-D echocardiogram. Next  Hypertension. He is being treated with Cardizem and lisinopril.  Hypernatremia. This will be monitored for intervention. For now, it is only mild and we will continue current therapy.  Bronchospasms. His bronchospasms are mostly upper airway. He is on Xopenex for treatment.  Macrocytosis. Likely secondary to alcohol abuse and newly diagnosed vitamin B 12 deficiency.  Alcohol abuse. There are no signs of alcohol withdrawal syndrome. He is on vitamin therapy and when necessary Ativan. The social worker has been consulted for assisting the patient with sobriety.  Morbid obesity. Registered dietitian has been consulted for recommendations.  Plan: 1. Continue current management. 2. We'll start vitamin B12 supplementation. 3. Await the results of the 2-D echocardiogram.    LOS: 1 day   Frederick Brown 05/22/2011, 9:56 AM

## 2011-05-22 NOTE — Consult Note (Signed)
CARDIOLOGY CONSULT NOTE  Patient ID: Frederick Brown MRN: [id number] DOB/AGE: 05/14/1950 61 y.o.  Admit date: 05/21/2011 Referring Physician: PTH Primary Frederick Hero, MD, MD Reason for Consultation: CHF, New Onset Atrial fib. Active Problems:  Congestive heart failure  Bronchospasm  Atrial fibrillation  And  Superficial fungal infection of skin  Hypernatremia  Macrocytosis  Alcohol abuse  HPI: [name] is a 61 year old severely morbidly obese male patient who was admitted with shortness of breath, fluid retention and edema. He has no prior cardiac history nor has he been seen by a cardiologist in the past. However, he states approximately 10-12 years ago. He was having chest discomfort was hospitalized in [place], where he had a workup for cardiac and was found to have no issues. He has not been followed by cardiologist since that admission. He has a history of hypertension and GERD. He has been morbidly obese for the last 10 years since retiring and is always "been a big man". He admits to medical noncompliance. He has a history of hypertension and has not been taking his metoprolol over the last 2 months secondary to lack of insurance and disability.  On day of admission. The patient was complaining of shortness of breath, swelling in his abdomen, legs, and PND. While in the emergency room the patient had a chest x-ray, which revealed cardiomegaly and probable moderate pulmonary vascular congestion. He was also found to be in atrial fibrillation. He states that he has never been told he had an irregular heart rhythm before or that he had atrial fibrillation. Secondary to wheezing, BB was not restarted, but is now on cardizem. BNP on admission was 1490. He has been placed on IV Lasix 40 mg every 12 hours with excellent diuresis. He has lost from a 591 pounds to 574 pounds. Echocardiogram has been ordered, but not completed at this time.  He states he is feeling better. Has  not had any chest discomfort. He does suffer from GERD symptoms and has been seen by GI in the past. He has a history of ETOH daily. Alcohol use, drinking 3 drinks of whiskey a day, with a mixer, and has been doing so for several years.  Review of systems complete and found to be negative unless listed above   Past Medical History  Diagnosis Date  . Hypertension   . Obesity, morbid (more than 100 lbs over ideal weight or BMI > 40)   . Degenerative joint disease   . Alcohol abuse   . Umbilical hernia   . Gastroesophageal reflux disease     Family History  Problem Relation Age of Onset  . Heart attack Father   . Heart failure Father   . Stroke Father   . Hypertension Father     History   Social History  . Marital Status: Divorced    Spouse Name: N/A    Number of Children: N/A  . Years of Education: N/A   Occupational History  . Not on file.   Social History Main Topics  . Smoking status: Never Smoker   . Smokeless tobacco: Not on file  . Alcohol Use: Yes     2-3 liquor drinks daily  . Drug Use: No  . Sexually Active: Not Currently   Other Topics Concern  . Not on file   Social History Narrative  . No narrative on file    Past Surgical History  Procedure Date  . Cholecystectomy   . Knee surgery  Prescriptions prior to admission  Medication Sig Dispense Refill  . naproxen sodium (ANAPROX) 220 MG tablet Take 220 mg by mouth every 8 (eight) hours as needed. For pain      . omeprazole (PRILOSEC) 20 MG capsule Take 20 mg by mouth daily.        Physical Exam: Blood pressure 153/90, pulse 106, temperature 98.1 F (36.7 C), temperature source Oral, resp. rate 21, height 5\' 10"  (1.778 m), weight 574 lb 11.2 oz (260.682 kg), SpO2 92.00%.   General: Well developed, well nourished,morbidly obese, in no acute distress Head: Eyes PERRLA, No xanthomas.   Normal cephalic and atramatic  Lungs: Inspiratory and expiratory wheezes. Heart: HRIR, without MRG.  Pulses are 2+  & equal.    Neck obese unable to see JVD.Marland Kitchen No JVD.  No abdominal bruits. No femoral bruits. Abdomen: Bowel sounds are positive, abdomen soft, pendulous, and non-tender without masses or    Hernia's noted. Msk:  Back normal, Normal strength and tone for age. Extremities: No clubbing, cyanosis 2+ [pitting  edema.  DP +1 Neuro: Alert and oriented X 3. Psych:  Good affect, responds appropriately   Labs:   Lab Results  Component Value Date   WBC 6.6 05/22/2011   HGB 14.6 05/22/2011   HCT 47.2 05/22/2011   MCV 108.5* 05/22/2011   PLT 191 05/22/2011     Lab 05/22/11 0552  NA 146*  K 4.0  CL 105  CO2 33*  BUN 11  CREATININE 0.96  CALCIUM 10.3  PROT 6.5  BILITOT 1.1  ALKPHOS 123*  ALT 18  AST 31  GLUCOSE 113*   Lab Results  Component Value Date   CKTOTAL 100 05/22/2011   CKMB 2.1 05/22/2011   TROPONINI <0.30 05/22/2011        Radiology: Dg Chest Portable 1 View  05/21/2011  *RADIOLOGY REPORT*  Clinical Data: Shortness of breath  PORTABLE CHEST - 1 VIEW  Comparison: None.  Findings: There is cardiomegaly present with probable pulmonary vascular congestion.  No focal infiltrate is seen and no definite effusion is noted.  No bony abnormality is seen.  IMPRESSION: Cardiomegaly and probable moderate pulmonary vascular congestion. Recommend two-view chest x-ray.  Original Report Authenticated By: Frederick Brown, M.D.   ZOX:WRUEAV fib with T-wave flattening antero/lateral leads.  ASSESSMENT AND PLAN:   1. New onset atrial fibrillation: Currently on Cardizem 30 mg 4 times a day with good heart rate control along with anticoagulation of lobular weight heparin 40 mg q. 24 hours. CHADscore at this time is 2 for CHF and hypertension. With sedentary lifestyle. He is a high likelihood of having embolus. Would recommend starting Coumadin if he is able to afford it and take it regularly. It may be difficult to have INR checks unless he has home health services to do this for him. We will discuss  further with Dr. Diona Brown.  2. CHF: Uncertain if diastolic or systolic at this time. Echocardiogram is pending. Likely diastolic with history of hypertension. However, alcohol abuse. May also contribute to alcoholic cardiomyopathy. Atrial fibrillation, also contributed. Uncertain what his dry weight is, but he has been diuresing very well on IV Lasix as started by Dr. Sherrie Brown. Creatinine is 0.96 this a.m. Continue IV diuresis for at least 48 hours. We will add ACE inhibitor for cardiorenal protection.  3. Morbid obesity: Obviously contributing to overall health status. Consideration for an outpatient discussion with primary Brown for gastric bypass surgery once overall status has been determined from a cardiac standpoint.  He denies any snoring or sleep apnea. Would consider sleep study to evaluate further to assist with breathing status pulmonary pressures and hypertensive control.   Signed: Bettey Mare. Lyman Bishop NP Frederick Brown 05/22/2011, 8:45 AM Co-Sign MD   Attending note:  Patient seen and examined. Records reviewed as well as database as recorded by Frederick Brown. [name] is a morbidly obese (75 pounds) 61 year old male with history of hypertension, noncompliance with medications, regular alcohol use, degenerative joint disease, and GERD. He presents reporting a 2 month history of shortness of breath and lower extremity edema over baseline following an episode of "bronchitis" during which time he was coughing. His cough has resolved, although he has had decrease in his overall functional status, seems to be quite limited in terms of chronic knee and hip pain. He denies any recent chest pain, and has had no palpitations or syncope.  Initial evaluation has documented atrial fibrillation, duration uncertain, with mildly increased ventricular rate. Pro BNP was increased at 1490 and chest x-ray reports cardiomegaly with possible pulmonary vascular congestion but no frank edema or infiltrate.  Cardiac markers argue against an acute coronary syndrome, and TSH is normal at 1.3.  He has been managed with aspirin, Cardizem, Lovenox, lisinopril, and intravenous Lasix with good diuresis at this point. On examination today he is not short of breath at rest, denies any chest pain or palpitations. Heart rate is in the 100-110 range in atrial fibrillation by telemetry. Blood pressure 153/90. Neck shows increased girth, difficult to assess JVP. Lungs exhibit decreased breath sounds throughout with slight end expiratory wheeze in the upper airway. Cardiac exam reveals distant irregular heart sounds, indistinct PMI. Extremities show 3+ edema, relatively soft.  Clinical question posed to [place] is whether patient presents with congestive heart failure as etiology of his shortness of breath. This is certainly a possibility, either systolic or diastolic. He also has atrial fibrillation of uncertain duration which could be related. Regular alcohol use would place him at risk for cardiomyopathy. Whether he had some type of viral syndrome a few months ago with his cough is also to be considered. His weight does present a significant challenge and limitation to objective cardiac testing. He is scheduled for an echocardiogram today which will hopefully provide further evaluation of his LV function and help to guide medical therapy. For now would continue intravenous diuresis. If we are able to better assess LV function, this might guide whether calcium channel blocker therapy is continued, or whether we ultimately consider a beta blocker. ACE inhibitor should be continued for now as well. The question of anticoagulation is certainly reasonable in light of his thromboembolic risk, however it would be important for him to establish a more regular followup pattern prior to considering this. Sleep apnea is certainly also to be considered.  Frederick Brown, M.D., F.A.C.C.

## 2011-05-22 NOTE — Plan of Care (Signed)
Problem: Phase I Progression Outcomes Goal: Voiding-avoid urinary catheter unless indicated Outcome: Not Met (add Reason) Measuring strict I & o

## 2011-05-22 NOTE — Plan of Care (Signed)
Problem: Food- and Nutrition-Related Knowledge Deficit (NB-1.1) Goal: Nutrition education Formal process to instruct or train a patient/client in a skill or to impart knowledge to help patients/clients voluntarily manage or modify food choices and eating behavior to maintain or improve health.  Outcome: Completed/Met Date Met:  05/22/11 Reviewed chart. Patient is a 61 y.o. Male admitted due to shortness of breath. Has past medical history of hypertension, GERD, ETOH abuse, and degenerative joint disease. BMI is 82.46, classifying him as Class III Obesity (Morbid obesity). Labs show elevated sodium levels. Used the Nutrition Care Manual "General Healthful Nutrition" handout to review healthy eating basics. Briefly discussed incorporating more fruits and vegetables, increasing fiber intake, and using healthier fat alternatives to help with blood pressure/weight management. Patient did not appear interested in learning about healthy eating, but was accepting of handout and verbalized understanding of its concepts. Patient had no additional questions regarding diet. Overall compliance upon discharge is unsure at this time as patient had minimal interest in education.

## 2011-05-23 DIAGNOSIS — I5031 Acute diastolic (congestive) heart failure: Principal | ICD-10-CM | POA: Diagnosis present

## 2011-05-23 DIAGNOSIS — M109 Gout, unspecified: Secondary | ICD-10-CM | POA: Diagnosis present

## 2011-05-23 LAB — URIC ACID: Uric Acid, Serum: 9.5 mg/dL — ABNORMAL HIGH (ref 4.0–7.8)

## 2011-05-23 MED ORDER — INDOMETHACIN 50 MG RE SUPP: 50.0000 mg | Freq: Two times a day (BID) | RECTAL | Status: DC

## 2011-05-23 MED ORDER — MORPHINE SULFATE 4 MG/ML IJ SOLN: 4.0000 mg | INTRAMUSCULAR | Status: DC | PRN

## 2011-05-23 MED ORDER — DILTIAZEM HCL 60 MG PO TABS
60.0000 mg | ORAL_TABLET | Freq: Three times a day (TID) | ORAL | Status: DC
  Administered 2011-05-23 – 2011-05-24 (×3): 60 mg via ORAL
  Filled 2011-05-23 (×3): qty 1

## 2011-05-23 MED ORDER — COLCHICINE 0.6 MG PO TABS
0.6000 mg | ORAL_TABLET | ORAL | Status: AC
  Administered 2011-05-23 (×2): 0.6 mg via ORAL
  Filled 2011-05-23 (×2): qty 1

## 2011-05-23 MED ORDER — OXYCODONE HCL 5 MG PO TABS
10.0000 mg | ORAL_TABLET | ORAL | Status: DC | PRN
  Administered 2011-05-24 – 2011-05-26 (×3): 10 mg via ORAL
  Administered 2011-05-27: 5 mg via ORAL
  Administered 2011-05-27 – 2011-05-30 (×5): 10 mg via ORAL
  Filled 2011-05-23: qty 1
  Filled 2011-05-23 (×9): qty 2

## 2011-05-23 MED ORDER — INDOMETHACIN 25 MG PO CAPS
50.0000 mg | ORAL_CAPSULE | Freq: Two times a day (BID) | ORAL | Status: DC
  Administered 2011-05-23 – 2011-05-25 (×4): 50 mg via ORAL
  Filled 2011-05-23 (×4): qty 2

## 2011-05-23 NOTE — Consult Note (Signed)
WOC consult Note Reason for Consult: Bedside nurse reports obese patient has intertrigo to pannus skin folds.  Moist, red raw skin denuded with patchy areas of partial thickness skin loss and strong odor. Dressing procedure/placement/frequency: Interdry fabric to absorb moisture and wick drainage away from skin.  This should remain in place for 5 days and provides antimicrobial benefits. Notified store room of need for supplies. Fabric should be discontinued after 2/26.  Will not plan to follow further unless re-consulted.  [address] RN, MSN, [employer]  [phone]

## 2011-05-23 NOTE — Progress Notes (Addendum)
SUBJECTIVE: Complaints of right foot pain. "I have gout."  Active Problems:  Congestive heart failure  Bronchospasm  Atrial fibrillation  Superficial fungal infection of skin  Hypernatremia  Macrocytosis  Alcohol abuse  Vitamin B12 deficiency   LABS: Basic Metabolic Panel:  Basename 05/22/11 0552 05/21/11 1242  NA 146* 146*  K 4.0 4.1  CL 105 107  CO2 33* 32  GLUCOSE 113* 109*  BUN 11 13  CREATININE 0.96 0.94  CALCIUM 10.3 10.3  MG -- --  PHOS -- --   Liver Function Tests:  Basename 05/22/11 0552  AST 31  ALT 18  ALKPHOS 123*  BILITOT 1.1  PROT 6.5  ALBUMIN 3.3*   No results found for this basename: LIPASE:2,AMYLASE:2 in the last 72 hours CBC:  Basename 05/22/11 0552 05/21/11 1242  WBC 6.6 5.6  NEUTROABS -- 4.1  HGB 14.6 14.8  HCT 47.2 47.4  MCV 108.5* 107.7*  PLT 191 179   Cardiac Enzymes:  Basename 05/22/11 0915 05/22/11 0118 05/21/11 1849  CKTOTAL 78 100 112  CKMB 1.9 2.1 2.5  CKMBINDEX -- -- --  TROPONINI <0.30 <0.30 <0.30   Hemoglobin A1C:  Basename 05/21/11 1849  HGBA1C 5.6   Thyroid Function Tests:  Basename 05/21/11 1242  TSH 1.300  T4TOTAL --  T3FREE --  THYROIDAB --    RADIOLOGY: Dg Chest Portable 1 View  05/21/2011  *RADIOLOGY REPORT*  Clinical Data: Shortness of breath  PORTABLE CHEST - 1 VIEW  Comparison: None.  Findings: There is cardiomegaly present with probable pulmonary vascular congestion.  No focal infiltrate is seen and no definite effusion is noted.  No bony abnormality is seen.  IMPRESSION: Cardiomegaly and probable moderate pulmonary vascular congestion. Recommend two-view chest x-ray.  Original Report Authenticated By: Juline Patch, M.D.   ECHO 05/23/2011 Left ventricle: The cavity size was at the upper limits of normal. Wall thickness was increased in a pattern of mild LVH. Systolic function was normal. The estimated ejection fraction was in the range of 55% to 60%. The study is not technically sufficient to  allow evaluation of LV diastolic function. - Mitral valve: Calcified annulus. - Left atrium: The atrium was moderately dilated. - Right ventricle: The cavity size was moderately dilated. Systolic function was mildly reduced. - Right atrium: The atrium was moderately dilated. - Tricuspid valve: Mild regurgitation. - Pulmonary arteries: PA peak pressure: 38mm Hg (S). - Pericardium, extracardiac: A prominent pericardial fat pad was present.  PHYSICAL EXAM BP 131/73  Pulse 98  Temp(Src) 98.1 F (36.7 C) (Oral)  Resp 20  Ht 5\' 10"  (1.778 m)  Wt 577 lb 2.6 oz (261.8 kg)  BMI 82.81 kg/m2  SpO2 92% WT Loss 14 lbs since admission.  General: Well developed, well nourished, in no acute distress Head: Eyes PERRLA, No xanthomas.   Normal cephalic and atramatic  Lungs: Clear to auscultation in the upper lobes, diminished in the bases. Heart: HRIR S1 S2, No MRG .  Pulses are 2+ & equal.            No carotid bruit. No JVD.  No abdominal bruits. No femoral bruits. Abdomen: Bowel sounds are positive, abdomen soft and non-tender without masses or                  Hernia's noted. Msk:  Back normal, normal gait. Normal strength and tone for age. Extremities: No clubbing, cyanosis 1+ pitting edema.  DP +1. Left great toe Mildly erythematous. Neuro: Alert and oriented X 3.  Psych:  Good affect, responds appropriately  TELEMETRY: Reviewed telemetry pt in Atrial Fib 100-110 bpm  ASSESSMENT AND PLAN:  1. Diastolic CHF: Echocardiogram completed yesterday reveals normal LV function but unable to assess diastolic function in the setting of atrial fibrillation. Is most likely however diastolic dysfunction, with continued fluid retention in the setting elevated PA pressures.. Need to continue IV diuretics at least every 24 hours. Weight is down 14 pounds since admission. Continue ACE inhibitor. Creatinine remained stable. He will need to find a primary care physician and see him regularly.  2. Atrial   Fibrillation: Heart rate is not completely controlled running in the high 90s to low 100s. He is current kinase and 30 mg Q6 hours. Blood pressure is labile between 117-154 systolic. Will increase Cardizem dose for better heart rate control. Decision needs to be made concerning anticoagulation. CHADS score is 2. He is a candidate for this but uncertain about compliance issues.   3. Morbid obesity: , He most likely has sleep apnea and will need to have a sleep study for confirmation. Weight loss is critical. Referall to bariatric center is recommended.  4. Left great toe pain:  Uric acid level will be ordered.Marland Kitchen He may need to be placed on colchicine. Bettey Mare. Lyman Bishop NP [facility] 05/23/2011, 9:20 AM   Attending note:  Patient seen and examined. He is lying supine in bed, breathing comfortably. No chest pain or palpitations. He is complaining of left great toe pain consistent with gout flare.  Echocardiogram was reviewed yesterday showing LVEF of 55-60%, also moderate RV dilatation with mild dysfunction, PASP 38 mmHg. No formal assessment of diastolic function, however suspect some element of diastolic dysfunction in light of atrial fibrillation and hypertension.  Continues with good diuresis, 2400 out yesterday. Present medications include aspirin, Cardizem increased to 60 mg by mouth 3 times a day, Lasix 40 mg IV every 12 hours, lisinopril 5 mg daily. Still working on more optimal heart rate control of atrial fibrillation. Blood pressure is reasonable.  Today we discussed the results of his testing, implications and treatment options for atrial fibrillation, also likelihood that he has sleep apnea which may be contributing to a component of cor pulmonale and his peripheral edema. Clearly weight loss would be beneficial. He will also need to establish more regular followup with his primary care physician Dr. Renard Matter. The issue of Coumadin was also discussed. He was somewhat hesitant to  consider this, and frankly I would not start anticoagulants until he establishes more regular followup.  Jonelle Sidle, M.D., F.A.C.C.

## 2011-05-23 NOTE — Progress Notes (Signed)
Chart reviewed.  Subjective: C/o gout pain in left leg. Breathing ok.  Objective: Vital signs in last 24 hours: Filed Vitals:   05/22/11 2105 05/23/11 0545 05/23/11 0730 05/23/11 1409  BP: 117/70 131/73    Pulse: 103 98    Temp: 99.4 F (37.4 C) 98.1 F (36.7 C)    TempSrc: Oral Oral    Resp: 20 20    Height:      Weight:  262.5 kg (578 lb 11.3 oz) 261.8 kg (577 lb 2.6 oz)   SpO2: 94% 92%  94%   Weight change: -5.576 kg (-12 lb 4.7 oz)  Intake/Output Summary (Last 24 hours) at 05/23/11 1644 Last data filed at 05/23/11 1500  Gross per 24 hour  Intake    243 ml  Output   4425 ml  Net  -4182 ml   Physical Exam:  Gen: talkative Lungs CTA CV RRR Abd obese, peau d'orange Ext: Left metatarsophalangeal joint erythematous, tender, warm. Tenderness about the entire foot as well. 2+ pitting edema bilaterally in the legs  Lab Results: Basic Metabolic Panel:  Lab 05/22/11 4401 05/21/11 1242  NA 146* 146*  K 4.0 4.1  CL 105 107  CO2 33* 32  GLUCOSE 113* 109*  BUN 11 13  CREATININE 0.96 0.94  CALCIUM 10.3 10.3  MG -- --  PHOS -- --   Liver Function Tests:  Lab 05/22/11 0552  AST 31  ALT 18  ALKPHOS 123*  BILITOT 1.1  PROT 6.5  ALBUMIN 3.3*   No results found for this basename: LIPASE:2,AMYLASE:2 in the last 168 hours No results found for this basename: AMMONIA:2 in the last 168 hours CBC:  Lab 05/22/11 0552 05/21/11 1242  WBC 6.6 5.6  NEUTROABS -- 4.1  HGB 14.6 14.8  HCT 47.2 47.4  MCV 108.5* 107.7*  PLT 191 179   Cardiac Enzymes:  Lab 05/22/11 0915 05/22/11 0118 05/21/11 1849  CKTOTAL 78 100 112  CKMB 1.9 2.1 2.5  CKMBINDEX -- -- --  TROPONINI <0.30 <0.30 <0.30   BNP:  Lab 05/21/11 1242  PROBNP 1490.0*   D-Dimer: No results found for this basename: DDIMER:2 in the last 168 hours CBG:  Lab 05/21/11 1217  GLUCAP 94   Hemoglobin A1C:  Lab 05/21/11 1849  HGBA1C 5.6   Fasting Lipid Panel: No results found for this basename:  CHOL,HDL,LDLCALC,TRIG,CHOLHDL,LDLDIRECT in the last 027 hours Thyroid Function Tests:  Lab 05/21/11 1242  TSH 1.300  T4TOTAL --  FREET4 1.18  T3FREE --  THYROIDAB --   Coagulation:  Lab 05/21/11 1242  LABPROT 13.3  INR 0.99   Anemia Panel:  Lab 05/21/11 1242  VITAMINB12 161*  FOLATE 6.5  FERRITIN --  TIBC --  IRON --  RETICCTPCT --   Urine Drug Screen: Drugs of Abuse  No results found for this basename: labopia, cocainscrnur, labbenz, amphetmu, thcu, labbarb    Alcohol Level: No results found for this basename: ETH:2 in the last 168 hours Urinalysis: No results found for this basename: COLORURINE:2,APPERANCEUR:2,LABSPEC:2,PHURINE:2,GLUCOSEU:2,HGBUR:2,BILIRUBINUR:2,KETONESUR:2,PROTEINUR:2,UROBILINOGEN:2,NITRITE:2,LEUKOCYTESUR:2 in the last 168 hours  Scheduled Meds:   . aspirin EC  81 mg Oral Daily  . colchicine  0.6 mg Oral Q2H  . cyanocobalamin  1,000 mcg Intramuscular Daily  . diltiazem  60 mg Oral Q8H  . enoxaparin  130 mg Subcutaneous Q24H  . furosemide  40 mg Intravenous Q12H  . indomethacin  50 mg Rectal BID  . levalbuterol  1.25 mg Nebulization Q6H  . lisinopril  5 mg Oral Daily  .  mulitivitamin with minerals  1 tablet Oral Daily  . nystatin   Topical BID  . pantoprazole  40 mg Oral Q1200  . sodium chloride  3 mL Intravenous Q12H  . thiamine  100 mg Oral Daily  . DISCONTD: diltiazem  30 mg Oral QID   Continuous Infusions:  PRN Meds:.sodium chloride, acetaminophen, acetaminophen, guaiFENesin-dextromethorphan, LORazepam, morphine, oxyCODONE, sodium chloride, DISCONTD: morphine, DISCONTD: oxyCODONE Assessment/Plan: Principal Problem:  *Acute diastolic heart failure Active Problems:  Acute gout  Atrial fibrillation  Bronchospasm  Superficial fungal infection of skin  Hypernatremia  Macrocytosis  Alcohol abuse  Vitamin B12 deficiency  Will start colchicine, indomethacin. Increased pain medication. Continue diuresis. Cyanocobalamin injections. No  evidence of alcohol withdrawal.   LOS: 2 days   Juletta Berhe L 05/23/2011, 4:44 PM

## 2011-05-23 NOTE — Progress Notes (Signed)
CARE MANAGEMENT NOTE 05/23/2011  Patient:  Frederick Brown, Frederick Brown   Account Number:  [id number]  Date Initiated:  05/23/2011  Documentation initiated by:  Rosemary Holms  Subjective/Objective Assessment:   Pt admitted with SOB. PTA lived at home alone.     Action/Plan:   CM to follow.   Anticipated DC Date:  05/27/2011   Anticipated DC Plan:  HOME/SELF CARE      DC Planning Services  CM consult      Choice offered to / List presented to:             Status of service:  In process, will continue to follow Medicare Important Message given?   (If response is "NO", the following Medicare IM given date fields will be blank) Date Medicare IM given:   Date Additional Medicare IM given:    Discharge Disposition:    Per UR Regulation:    Comments:  05/23/11 1200 Li Fragoso Leanord Hawking RN BSN CM

## 2011-05-24 ENCOUNTER — Inpatient Hospital Stay (HOSPITAL_COMMUNITY): Payer: Self-pay

## 2011-05-24 DIAGNOSIS — I279 Pulmonary heart disease, unspecified: Secondary | ICD-10-CM

## 2011-05-24 DIAGNOSIS — I2781 Cor pulmonale (chronic): Secondary | ICD-10-CM | POA: Diagnosis present

## 2011-05-24 DIAGNOSIS — M109 Gout, unspecified: Secondary | ICD-10-CM

## 2011-05-24 LAB — BASIC METABOLIC PANEL
CO2: 36 mEq/L — ABNORMAL HIGH (ref 19–32)
Calcium: 10.3 mg/dL (ref 8.4–10.5)
Chloride: 101 mEq/L (ref 96–112)
Glucose, Bld: 119 mg/dL — ABNORMAL HIGH (ref 70–99)
Potassium: 4.3 mEq/L (ref 3.5–5.1)
Sodium: 143 mEq/L (ref 135–145)

## 2011-05-24 MED ORDER — PREDNISONE 20 MG PO TABS
60.0000 mg | ORAL_TABLET | Freq: Every day | ORAL | Status: DC
  Administered 2011-05-24 – 2011-05-25 (×2): 60 mg via ORAL
  Filled 2011-05-24 (×2): qty 3

## 2011-05-24 MED ORDER — COLCHICINE 0.6 MG PO TABS
0.6000 mg | ORAL_TABLET | ORAL | Status: AC
  Administered 2011-05-24 (×2): 0.6 mg via ORAL
  Filled 2011-05-24 (×2): qty 1

## 2011-05-24 MED ORDER — DILTIAZEM HCL 60 MG PO TABS
60.0000 mg | ORAL_TABLET | Freq: Four times a day (QID) | ORAL | Status: DC
  Administered 2011-05-24 – 2011-05-27 (×12): 60 mg via ORAL
  Filled 2011-05-24: qty 2
  Filled 2011-05-24 (×4): qty 1
  Filled 2011-05-24: qty 2
  Filled 2011-05-24 (×4): qty 1

## 2011-05-24 NOTE — Progress Notes (Signed)
Slept well most all night. Remains A Fib on monitor with controlled rate in the 90's. Uneventful shift. No changes from PM assessment.

## 2011-05-24 NOTE — Progress Notes (Addendum)
SUBJECTIVE:Compliants of leg pain and swelling.  Principal Problem:  *Acute diastolic heart failure Active Problems:  Bronchospasm  Atrial fibrillation  Superficial fungal infection of skin  Hypernatremia  Macrocytosis  Alcohol abuse  Vitamin B12 deficiency  Acute gout   LABS: Basic Metabolic Panel:  Basename 05/24/11 0528 05/22/11 0552  NA 143 146*  K 4.3 4.0  CL 101 105  CO2 36* 33*  GLUCOSE 119* 113*  BUN 14 11  CREATININE 0.98 0.96  CALCIUM 10.3 10.3  MG -- --  PHOS -- --   Liver Function Tests:  [facility] ([facility] 05/22/11 0552  AST 31  ALT 18  ALKPHOS 123*  BILITOT 1.1  PROT 6.5  ALBUMIN 3.3*   No results found for this basename: LIPASE:2,AMYLASE:2 in the last 72 hours CBC:  Basename 05/22/11 0552 05/21/11 1242  WBC 6.6 5.6  NEUTROABS -- 4.1  HGB 14.6 14.8  HCT 47.2 47.4  MCV 108.5* 107.7*  PLT 191 179   Cardiac Enzymes:  Basename 05/22/11 0915 05/22/11 0118 05/21/11 1849  CKTOTAL 78 100 112  CKMB 1.9 2.1 2.5  CKMBINDEX -- -- --  TROPONINI <0.30 <0.30 <0.30   Hemoglobin A1C:  Basename 05/21/11 1849  HGBA1C 5.6   Fasting Lipid Panel: No results found for this basename: CHOL,HDL,LDLCALC,TRIG,CHOLHDL,LDLDIRECT in the last 72 hours Thyroid Function Tests:  Basename 05/21/11 1242  TSH 1.300  T4TOTAL --  T3FREE --  THYROIDAB --    RADIOLOGY:  PHYSICAL EXAM BP 123/72  Pulse 97  Temp(Src) 98.4 F (36.9 C) (Oral)  Resp 20  Ht 5\' 10"  (1.778 m)  Wt 569 lb 14.2 oz (258.5 kg)  BMI 81.77 kg/m2  SpO2 94%WT loss: 22 lbs. General: Well developed, well nourished, in no acute distress. Morbidly obese. Head: Eyes PERRLA, No xanthomas.   Normal cephalic and atramatic  Lungs: Inspiratory wheezes with shallow respirations Heart: HRIR S1 S2, No MRG .  Pulses are 2+ & equal. No carotid bruit. No JVD.  No abdominal bruits. No femoral bruits. Abdomen: Bowel sounds are positive, abdomen soft and non-tender without masses or  Hernia's noted. Msk:  Back  normal,Normal strength and tone for age. Extremities: No clubbing, cyanosis. 2+ bilateral edema.  DP +1 Neuro: Alert and oriented X 3. Psych:  Good affect, responds appropriately  TELEMETRY: Reviewed telemetry pt in Atrial fib with rates 80's-90's.  ASSESSMENT AND PLAN: 1. Diastolic heart failure: Patient continues to diurese very well and has lost approximately 22 pounds since admission. He continues edematous and could probably lose another 20 pounds of fluid prior to discharge. He is become complacent about his health care and is not willing to take more as possibility for it. I am concerned about him going home without some sort of assistance to maintain optimal medical regimen. At this, time. We will continue IV diuresis. Creatinine was stable. He complains of increased lower extremity edema. Some pain in the foot and legs. Will do Doppler ultrasounds of the lower extremity to rule out DVT. He is on DVT prophylaxis dose of enoxaparin at this time. May need to consider increasing, dose. We will await results of ultrasound..  2. Atrial fibrillation: Heart rate in the 90s. per telemetry. He is on diltiazem 60 mg every 8 hours and eventually change to long-acting. Can consider increasing the dose for better heart rate control. This is a resting heart rate. We will increase Cardizem to 60 mg 4 times a day and monitor heart rate and blood pressure results. Blood pressure is well-controlled and  tolerating this dose. Continuing on ACE inhibitors well. No plans to place him on Coumadin secondary to medical noncompliance and concerns for followup.  Discussion with patient concerning becoming more involved in his health care and taking more responsibility for his weight and following up with physicians. He appears complacent and apathetic about doing so.  Bettey Mare. Lyman Bishop NP [facility] 05/24/2011, 8:32 AM   Attending note:  Reviewed. No major change in clinical status, although heart rate  is better controlled following increase dose in Cardizem. Renal function is also stable with continued intravenous diuresis. He is also being treated for a gout flare.  Breathing comfortably at rest, most recent vital signs with blood pressure 123/72, heart rate in the 90s in atrial fibrillation by telemetry. Still with peripheral edema.  Current medical regimen includes aspirin, diltiazem 60 mg every 6 hours, Lovenox, Lasix 40 mg IV every 12 hours, lisinopril 5 mg daily.  Lower extremity venous Dopplers have been ordered to exclude thrombosis.  General management plan would include a switch to Cardizem CD, likely 240 mg daily if he tolerates the current short acting formulation, eventually conversion to oral Lasix as diuresis slows or creatinine increases, continuing aspirin for now as well as ACE inhibitor. He will need to establish regular followup with Dr. Regino Schultze as an outpatient for further medication adjustments, and discussion about Coumadin and compliance. An outpatient sleep study would also be recommended. Weight management has also been discussed.  Jonelle Sidle, M.D., F.A.C.C.

## 2011-05-24 NOTE — Progress Notes (Signed)
Discussed with Dr. Diona Browner.  Subjective: Gout pain in left foot slightly improved, but now, right foot and leg hurt and are much more swollen.  Objective: Vital signs in last 24 hours: Filed Vitals:   05/23/11 2148 05/24/11 0600 05/24/11 1353 05/24/11 1419  BP: 129/73 123/72  132/74  Pulse: 99 97  90  Temp: 98.6 F (37 C) 98.4 F (36.9 C)  98.5 F (36.9 C)  TempSrc: Oral Oral  Oral  Resp: 20 20  20   Height:      Weight:  258.5 kg (569 lb 14.2 oz)    SpO2: 92% 94% 94% 93%   Weight change: -0.7 kg (-1 lb 8.7 oz)  Intake/Output Summary (Last 24 hours) at 05/24/11 1510 Last data filed at 05/24/11 1200  Gross per 24 hour  Intake    884 ml  Output   2350 ml  Net  -1466 ml   Physical Exam:  Gen: Reading the newspaper Lungs CTA CV RRR Abd obese, peau d'orange slightly less Ext: Left metatarsophalangeal joint erythematous, tender, warm. Tenderness about the entire foot as well. 2+ pitting edema bilaterally in the legs. Right foot now much more swollen.  Lab Results: Basic Metabolic Panel:  Lab 05/24/11 1610 05/22/11 0552  NA 143 146*  K 4.3 4.0  CL 101 105  CO2 36* 33*  GLUCOSE 119* 113*  BUN 14 11  CREATININE 0.98 0.96  CALCIUM 10.3 10.3  MG -- --  PHOS -- --   Liver Function Tests:  Lab 05/22/11 0552  AST 31  ALT 18  ALKPHOS 123*  BILITOT 1.1  PROT 6.5  ALBUMIN 3.3*   No results found for this basename: LIPASE:2,AMYLASE:2 in the last 168 hours No results found for this basename: AMMONIA:2 in the last 168 hours CBC:  Lab 05/22/11 0552 05/21/11 1242  WBC 6.6 5.6  NEUTROABS -- 4.1  HGB 14.6 14.8  HCT 47.2 47.4  MCV 108.5* 107.7*  PLT 191 179   Cardiac Enzymes:  Lab 05/22/11 0915 05/22/11 0118 05/21/11 1849  CKTOTAL 78 100 112  CKMB 1.9 2.1 2.5  CKMBINDEX -- -- --  TROPONINI <0.30 <0.30 <0.30   BNP:  Lab 05/21/11 1242  PROBNP 1490.0*   D-Dimer: No results found for this basename: DDIMER:2 in the last 168 hours CBG:  Lab 05/21/11 1217    GLUCAP 94   Hemoglobin A1C:  Lab 05/21/11 1849  HGBA1C 5.6   Fasting Lipid Panel: No results found for this basename: CHOL,HDL,LDLCALC,TRIG,CHOLHDL,LDLDIRECT in the last 960 hours Thyroid Function Tests:  Lab 05/21/11 1242  TSH 1.300  T4TOTAL --  FREET4 1.18  T3FREE --  THYROIDAB --   Coagulation:  Lab 05/21/11 1242  LABPROT 13.3  INR 0.99   Anemia Panel:  Lab 05/21/11 1242  VITAMINB12 161*  FOLATE 6.5  FERRITIN --  TIBC --  IRON --  RETICCTPCT --   Urine Drug Screen: Drugs of Abuse  No results found for this basename: labopia,  cocainscrnur,  labbenz,  amphetmu,  thcu,  labbarb    Alcohol Level: No results found for this basename: ETH:2 in the last 168 hours Urinalysis: No results found for this basename: COLORURINE:2,APPERANCEUR:2,LABSPEC:2,PHURINE:2,GLUCOSEU:2,HGBUR:2,BILIRUBINUR:2,KETONESUR:2,PROTEINUR:2,UROBILINOGEN:2,NITRITE:2,LEUKOCYTESUR:2 in the last 168 hours  Scheduled Meds:    . aspirin EC  81 mg Oral Daily  . colchicine  0.6 mg Oral Q2H  . colchicine  0.6 mg Oral Q2H  . cyanocobalamin  1,000 mcg Intramuscular Daily  . diltiazem  60 mg Oral Q6H  . enoxaparin  130  mg Subcutaneous Q24H  . furosemide  40 mg Intravenous Q12H  . indomethacin  50 mg Oral BID WC  . levalbuterol  1.25 mg Nebulization Q6H  . lisinopril  5 mg Oral Daily  . mulitivitamin with minerals  1 tablet Oral Daily  . nystatin   Topical BID  . pantoprazole  40 mg Oral Q1200  . predniSONE  60 mg Oral Q breakfast  . sodium chloride  3 mL Intravenous Q12H  . thiamine  100 mg Oral Daily  . DISCONTD: diltiazem  60 mg Oral Q8H  . DISCONTD: indomethacin  50 mg Rectal BID   Continuous Infusions:  PRN Meds:.sodium chloride, acetaminophen, acetaminophen, guaiFENesin-dextromethorphan, LORazepam, morphine, oxyCODONE, sodium chloride, DISCONTD: morphine, DISCONTD: oxyCODONE Assessment/Plan: Principal Problem:  *Acute diastolic heart failure Active Problems:  Acute gout  Atrial  fibrillation  Bronchospasm  Superficial fungal infection of skin  Hypernatremia  Macrocytosis  Alcohol abuse  Vitamin B12 deficiency  Cor pulmonale, chronic  Agree with Dopplers. Add prednisone. Given the other couple of doses of colchicine. Continue diuresis.   LOS: 3 days   Nelwyn Hebdon L 05/24/2011, 3:10 PM

## 2011-05-25 MED ORDER — LEVALBUTEROL HCL 0.63 MG/3ML IN NEBU
1.2500 mg | INHALATION_SOLUTION | Freq: Four times a day (QID) | RESPIRATORY_TRACT | Status: DC | PRN
Start: 2011-05-25 — End: 2011-05-31

## 2011-05-25 MED ORDER — PREDNISONE 20 MG PO TABS
40.0000 mg | ORAL_TABLET | Freq: Every day | ORAL | Status: DC
  Administered 2011-05-26 – 2011-05-27 (×2): 40 mg via ORAL
  Filled 2011-05-25 (×2): qty 2

## 2011-05-25 MED ORDER — COLCHICINE 0.6 MG PO TABS
0.6000 mg | ORAL_TABLET | Freq: Every day | ORAL | Status: DC
  Administered 2011-05-25 – 2011-05-27 (×3): 0.6 mg via ORAL
  Filled 2011-05-25 (×3): qty 1

## 2011-05-25 MED ORDER — LEVALBUTEROL HCL 1.25 MG/0.5ML IN NEBU
1.2500 mg | INHALATION_SOLUTION | Freq: Two times a day (BID) | RESPIRATORY_TRACT | Status: DC
  Administered 2011-05-26 – 2011-05-28 (×6): 1.25 mg via RESPIRATORY_TRACT
  Filled 2011-05-25 (×9): qty 0.5

## 2011-05-25 MED ORDER — SODIUM CHLORIDE 0.9 % IJ SOLN
INTRAMUSCULAR | Status: AC
  Administered 2011-05-25: 10 mL
  Filled 2011-05-25: qty 3

## 2011-05-25 MED ORDER — FUROSEMIDE 40 MG PO TABS
40.0000 mg | ORAL_TABLET | Freq: Two times a day (BID) | ORAL | Status: DC
  Administered 2011-05-25 – 2011-05-28 (×7): 40 mg via ORAL
  Filled 2011-05-25: qty 1
  Filled 2011-05-25: qty 2
  Filled 2011-05-25 (×5): qty 1

## 2011-05-25 NOTE — Progress Notes (Signed)
Subjective: Gout pain starting to improve. Has not yet been out of bed.  Objective: Vital signs in last 24 hours: Filed Vitals:   05/24/11 2126 05/24/11 2300 05/25/11 0427 05/25/11 0436  BP: 126/63   117/72  Pulse: 94 82  82  Temp: 98.3 F (36.8 C)   97.5 F (36.4 C)  TempSrc: Oral   Oral  Resp: 20   20  Height:      Weight:   264.3 kg (582 lb 10.8 oz)   SpO2: 93%   93%   Weight change: 2.5 kg (5 lb 8.2 oz)  Intake/Output Summary (Last 24 hours) at 05/25/11 1154 Last data filed at 05/25/11 0529  Gross per 24 hour  Intake    966 ml  Output   1950 ml  Net   -984 ml   Physical Exam:  Gen: Talkative Lungs CTA CV RRR Abd swelling much improved. Ext: Still with pretibial pitting edema, but pedal edema and tenderness improving.  Lab Results: Basic Metabolic Panel:  Lab 05/24/11 1191 05/22/11 0552  NA 143 146*  K 4.3 4.0  CL 101 105  CO2 36* 33*  GLUCOSE 119* 113*  BUN 14 11  CREATININE 0.98 0.96  CALCIUM 10.3 10.3  MG -- --  PHOS -- --   Liver Function Tests:  Lab 05/22/11 0552  AST 31  ALT 18  ALKPHOS 123*  BILITOT 1.1  PROT 6.5  ALBUMIN 3.3*   No results found for this basename: LIPASE:2,AMYLASE:2 in the last 168 hours No results found for this basename: AMMONIA:2 in the last 168 hours CBC:  Lab 05/22/11 0552 05/21/11 1242  WBC 6.6 5.6  NEUTROABS -- 4.1  HGB 14.6 14.8  HCT 47.2 47.4  MCV 108.5* 107.7*  PLT 191 179   Cardiac Enzymes:  Lab 05/22/11 0915 05/22/11 0118 05/21/11 1849  CKTOTAL 78 100 112  CKMB 1.9 2.1 2.5  CKMBINDEX -- -- --  TROPONINI <0.30 <0.30 <0.30   BNP:  Lab 05/21/11 1242  PROBNP 1490.0*   D-Dimer: No results found for this basename: DDIMER:2 in the last 168 hours CBG:  Lab 05/21/11 1217  GLUCAP 94   Hemoglobin A1C:  Lab 05/21/11 1849  HGBA1C 5.6   Fasting Lipid Panel: No results found for this basename: CHOL,HDL,LDLCALC,TRIG,CHOLHDL,LDLDIRECT in the last 478 hours Thyroid Function Tests:  Lab  05/21/11 1242  TSH 1.300  T4TOTAL --  FREET4 1.18  T3FREE --  THYROIDAB --   Coagulation:  Lab 05/21/11 1242  LABPROT 13.3  INR 0.99   Anemia Panel:  Lab 05/21/11 1242  VITAMINB12 161*  FOLATE 6.5  FERRITIN --  TIBC --  IRON --  RETICCTPCT --   Urine Drug Screen: Drugs of Abuse  No results found for this basename: labopia,  cocainscrnur,  labbenz,  amphetmu,  thcu,  labbarb    Alcohol Level: No results found for this basename: ETH:2 in the last 168 hours Urinalysis: No results found for this basename: COLORURINE:2,APPERANCEUR:2,LABSPEC:2,PHURINE:2,GLUCOSEU:2,HGBUR:2,BILIRUBINUR:2,KETONESUR:2,PROTEINUR:2,UROBILINOGEN:2,NITRITE:2,LEUKOCYTESUR:2 in the last 168 hours  Scheduled Meds:    . aspirin EC  81 mg Oral Daily  . colchicine  0.6 mg Oral Q2H  . colchicine  0.6 mg Oral Daily  . cyanocobalamin  1,000 mcg Intramuscular Daily  . diltiazem  60 mg Oral Q6H  . enoxaparin  130 mg Subcutaneous Q24H  . furosemide  40 mg Oral BID  . levalbuterol  1.25 mg Nebulization Q6H  . lisinopril  5 mg Oral Daily  . mulitivitamin with minerals  1 tablet Oral Daily  . nystatin   Topical BID  . pantoprazole  40 mg Oral Q1200  . predniSONE  40 mg Oral Q breakfast  . sodium chloride  3 mL Intravenous Q12H  . sodium chloride      . thiamine  100 mg Oral Daily  . DISCONTD: furosemide  40 mg Intravenous Q12H  . DISCONTD: indomethacin  50 mg Oral BID WC  . DISCONTD: predniSONE  60 mg Oral Q breakfast   Continuous Infusions:  PRN Meds:.sodium chloride, acetaminophen, guaiFENesin-dextromethorphan, morphine, oxyCODONE, sodium chloride, DISCONTD: acetaminophen, DISCONTD: LORazepam Assessment/Plan: Principal Problem:  *Acute diastolic heart failure Active Problems:  Acute gout  Atrial fibrillation  Bronchospasm  Superficial fungal infection of skin  Hypernatremia  Macrocytosis  Alcohol abuse  Vitamin B12 deficiency  Cor pulmonale, chronic  No DVTs. Out of bed. Stop indomethacin.  Taper steroids. Continue daily colchicine. Check labs tomorrow. Change Lasix to by mouth. Home when able to ambulate.   LOS: 4 days   Frederick Brown 05/25/2011, 11:54 AM

## 2011-05-25 NOTE — Progress Notes (Signed)
Spoke with pt re: role of CSW/dcp.  Pt lives at home alone and is usually independent with ADLs.  Pt's biggest concern is that he has not been OOB in hospital and concerned that this may have a negitive effect on his mobility when he leaves.  CSW sopke with RN re: getting pt OOB.  CSW did not address pt's ? ETOH abuse as he had company in the room. Per pt, his brother lives close to him and can assist him as needed.  Weekend CSW to f/u with this 2/24.

## 2011-05-26 LAB — BASIC METABOLIC PANEL
BUN: 29 mg/dL — ABNORMAL HIGH (ref 6–23)
CO2: 37 mEq/L — ABNORMAL HIGH (ref 19–32)
Chloride: 100 mEq/L (ref 96–112)
Glucose, Bld: 105 mg/dL — ABNORMAL HIGH (ref 70–99)
Potassium: 4.2 mEq/L (ref 3.5–5.1)
Sodium: 142 mEq/L (ref 135–145)

## 2011-05-26 MED ORDER — SODIUM CHLORIDE 0.9 % IJ SOLN
INTRAMUSCULAR | Status: AC
  Filled 2011-05-26: qty 3

## 2011-05-26 MED ORDER — BIOTENE DRY MOUTH MT LIQD
15.0000 mL | Freq: Two times a day (BID) | OROMUCOSAL | Status: DC
  Administered 2011-05-26 – 2011-05-31 (×10): 15 mL via OROMUCOSAL

## 2011-05-26 NOTE — Progress Notes (Signed)
Subjective: Foot pain better but still swollen. Requiring a lot of assistance when out of bed. Worried about going home alone.  Objective: Vital signs in last 24 hours: Filed Vitals:   05/25/11 2318 05/26/11 0421 05/26/11 0546 05/26/11 0713  BP:   109/71   Pulse: 86  94   Temp:   97.5 F (36.4 C)   TempSrc:   Oral   Resp:   20   Height:      Weight:  257.7 kg (568 lb 2 oz)    SpO2:   97% 92%   Weight change: -6.6 kg (-14 lb 8.8 oz)  Intake/Output Summary (Last 24 hours) at 05/26/11 1102 Last data filed at 05/26/11 0421  Gross per 24 hour  Intake    783 ml  Output   1300 ml  Net   -517 ml   Physical Exam:  Gen: Talkative Lungs CTA CV RRR Abd swelling much improved. Ext: Still with pretibial pitting edema, but pedal edema and tenderness improving.  Lab Results: Basic Metabolic Panel:  Lab 05/26/11 1610 05/24/11 0528  NA 142 143  K 4.2 4.3  CL 100 101  CO2 37* 36*  GLUCOSE 105* 119*  BUN 29* 14  CREATININE 0.91 0.98  CALCIUM 10.6* 10.3  MG -- --  PHOS -- --   Liver Function Tests:  Lab 05/22/11 0552  AST 31  ALT 18  ALKPHOS 123*  BILITOT 1.1  PROT 6.5  ALBUMIN 3.3*   No results found for this basename: LIPASE:2,AMYLASE:2 in the last 168 hours No results found for this basename: AMMONIA:2 in the last 168 hours CBC:  Lab 05/22/11 0552 05/21/11 1242  WBC 6.6 5.6  NEUTROABS -- 4.1  HGB 14.6 14.8  HCT 47.2 47.4  MCV 108.5* 107.7*  PLT 191 179   Cardiac Enzymes:  Lab 05/22/11 0915 05/22/11 0118 05/21/11 1849  CKTOTAL 78 100 112  CKMB 1.9 2.1 2.5  CKMBINDEX -- -- --  TROPONINI <0.30 <0.30 <0.30   BNP:  Lab 05/21/11 1242  PROBNP 1490.0*   D-Dimer: No results found for this basename: DDIMER:2 in the last 168 hours CBG:  Lab 05/21/11 1217  GLUCAP 94   Hemoglobin A1C:  Lab 05/21/11 1849  HGBA1C 5.6   Fasting Lipid Panel: No results found for this basename: CHOL,HDL,LDLCALC,TRIG,CHOLHDL,LDLDIRECT in the last 960 hours Thyroid  Function Tests:  Lab 05/21/11 1242  TSH 1.300  T4TOTAL --  FREET4 1.18  T3FREE --  THYROIDAB --   Coagulation:  Lab 05/21/11 1242  LABPROT 13.3  INR 0.99   Anemia Panel:  Lab 05/21/11 1242  VITAMINB12 161*  FOLATE 6.5  FERRITIN --  TIBC --  IRON --  RETICCTPCT --   Urine Drug Screen: Drugs of Abuse  No results found for this basename: labopia,  cocainscrnur,  labbenz,  amphetmu,  thcu,  labbarb    Alcohol Level: No results found for this basename: ETH:2 in the last 168 hours Urinalysis: No results found for this basename: COLORURINE:2,APPERANCEUR:2,LABSPEC:2,PHURINE:2,GLUCOSEU:2,HGBUR:2,BILIRUBINUR:2,KETONESUR:2,PROTEINUR:2,UROBILINOGEN:2,NITRITE:2,LEUKOCYTESUR:2 in the last 168 hours  Scheduled Meds:    . antiseptic oral rinse  15 mL Mouth Rinse BID  . aspirin EC  81 mg Oral Daily  . colchicine  0.6 mg Oral Daily  . cyanocobalamin  1,000 mcg Intramuscular Daily  . diltiazem  60 mg Oral Q6H  . enoxaparin  130 mg Subcutaneous Q24H  . furosemide  40 mg Oral BID  . levalbuterol  1.25 mg Nebulization BID  . lisinopril  5 mg  Oral Daily  . mulitivitamin with minerals  1 tablet Oral Daily  . nystatin   Topical BID  . pantoprazole  40 mg Oral Q1200  . predniSONE  40 mg Oral Q breakfast  . sodium chloride  3 mL Intravenous Q12H  . thiamine  100 mg Oral Daily  . DISCONTD: furosemide  40 mg Intravenous Q12H  . DISCONTD: indomethacin  50 mg Oral BID WC  . DISCONTD: levalbuterol  1.25 mg Nebulization Q6H  . DISCONTD: predniSONE  60 mg Oral Q breakfast   Continuous Infusions:  PRN Meds:.sodium chloride, acetaminophen, guaiFENesin-dextromethorphan, levalbuterol, morphine, oxyCODONE, sodium chloride, DISCONTD: acetaminophen, DISCONTD: LORazepam Assessment/Plan: Principal Problem:  *Acute diastolic heart failure Active Problems:  Acute gout  Atrial fibrillation  Bronchospasm  Superficial fungal infection of skin  Hypernatremia  Macrocytosis  Alcohol abuse  Vitamin  B12 deficiency  Cor pulmonale, chronic  PT evaluation and probable discharge tomorrow.   LOS: 5 days   Kade Rickels L 05/26/2011, 11:02 AM

## 2011-05-26 NOTE — Progress Notes (Signed)
CSW met with pt to offer support and ETOH abuse resources.  Pt denied any issues associated with ETOH abuse.  Pt reported daily drinks in the afternoon.  Pt stated he had abused ETOH in the past, but has not had any substance abuse issues in 5 years.  CSW reviewed available resources in the area with pt.  While pt did not feel he needed any substance abuse resources, he accepted information from CSW.  No other CSW needs id at this time.  Please contact weekday CSW with any other needs.  Tamala Julian, [place] [phone] weekend coverage

## 2011-05-27 ENCOUNTER — Inpatient Hospital Stay (HOSPITAL_COMMUNITY): Payer: Self-pay

## 2011-05-27 DIAGNOSIS — I5031 Acute diastolic (congestive) heart failure: Principal | ICD-10-CM

## 2011-05-27 MED ORDER — PREDNISONE 20 MG PO TABS
40.0000 mg | ORAL_TABLET | Freq: Two times a day (BID) | ORAL | Status: DC
  Administered 2011-05-27 – 2011-05-28 (×3): 40 mg via ORAL
  Filled 2011-05-27 (×3): qty 2

## 2011-05-27 MED ORDER — INDOMETHACIN 25 MG PO CAPS
50.0000 mg | ORAL_CAPSULE | Freq: Two times a day (BID) | ORAL | Status: AC
  Administered 2011-05-27 – 2011-05-28 (×3): 50 mg via ORAL
  Filled 2011-05-27 (×3): qty 2

## 2011-05-27 MED ORDER — DILTIAZEM HCL ER COATED BEADS 240 MG PO CP24
240.0000 mg | ORAL_CAPSULE | Freq: Every day | ORAL | Status: DC
  Administered 2011-05-27 – 2011-05-31 (×5): 240 mg via ORAL
  Filled 2011-05-27 (×5): qty 1

## 2011-05-27 MED ORDER — COLCHICINE 0.6 MG PO TABS
0.6000 mg | ORAL_TABLET | Freq: Two times a day (BID) | ORAL | Status: DC
  Administered 2011-05-27 – 2011-05-28 (×2): 0.6 mg via ORAL
  Filled 2011-05-27 (×2): qty 1

## 2011-05-27 MED ORDER — INDOMETHACIN 50 MG RE SUPP: 50.0000 mg | Freq: Two times a day (BID) | RECTAL | Status: DC

## 2011-05-27 NOTE — Progress Notes (Signed)
CARE MANAGEMENT NOTE 05/27/2011  Patient:  UNNAMED, ZEIEN   Account Number:  [id number]  Date Initiated:  05/23/2011  Documentation initiated by:  Rosemary Holms  Subjective/Objective Assessment:   Pt admitted with SOB. PTA lived at home alone.     Action/Plan:   Pt unable to stand on his own due to pain in left foot and weakness. CM advised pt that he was found ineligble for Medicaid therefor not a candidate for placement. CM told pt that when he is found Medically stable and DC'd he will need to   Anticipated DC Date:  05/27/2011   Anticipated DC Plan:  HOME/SELF CARE  In-house referral  Financial Counselor      DC Planning Services  CM consult      Choice offered to / List presented to:             Status of service:  In process, will continue to follow Medicare Important Message given?   (If response is "NO", the following Medicare IM given date fields will be blank) Date Medicare IM given:   Date Additional Medicare IM given:    Discharge Disposition:    Per UR Regulation:    Comments:  05/27/11/1130 cont from above: make arrangement to return Home.Rosemary Holms RN BSN CM  05/23/11 1200 Srikar Chiang Leanord Hawking RN BSN CM

## 2011-05-27 NOTE — Progress Notes (Addendum)
SUBJECTIVE:C/O gout pain in right foot. Cannot bear wt. Wants steroid injection. Breathing better.  Principal Problem:  *Acute diastolic heart failure Active Problems:  Bronchospasm  Atrial fibrillation  Superficial fungal infection of skin  Hypernatremia  Macrocytosis  Alcohol abuse  Vitamin B12 deficiency  Acute gout  Cor pulmonale, chronic   LABS: Basic Metabolic Panel:  Basename 05/26/11 0600  NA 142  K 4.2  CL 100  CO2 37*  GLUCOSE 105*  BUN 29*  CREATININE 0.91  CALCIUM 10.6*  MG --  PHOS --    RADIOLOGY: CHEST X-RAY 05/21/2011 IMPRESSION: Cardiomegaly and probable moderate pulmonary vascular congestion.  ECHO: 05/22/2011 Left ventricle: The cavity size was at the upper limits of normal. Wall thickness was increased in a pattern of mild LVH. Systolic function was normal. The estimated ejection fraction was in the range of 55% to 60%. The study is not technically sufficient to allow evaluation of LV diastolic function. - Mitral valve: Calcified annulus. - Left atrium: The atrium was moderately dilated. - Right ventricle: The cavity size was moderately dilated. Systolic function was mildly reduced. - Right atrium: The atrium was moderately dilated. - Tricuspid valve: Mild regurgitation. - Pulmonary arteries: PA peak pressure: 38mm Hg (S). - Pericardium, extracardiac: A prominent pericardial fat pad was present.  PHYSICAL EXAM BP 103/64  Pulse 94  Temp(Src) 97.5 F (36.4 C) (Oral)  Resp 22  Ht 5\' 10"  (1.778 m)  Wt 563 lb 11.5 oz (255.7 kg)  BMI 80.88 kg/m2  SpO2 93%WT LOSS: 29 lbs.  General: Well developed, well nourished, in no acute distress Head: Eyes PERRLA, No xanthomas.   Normal cephalic and atramatic  Lungs: Mild inspiratory and expiratory wheeze; resonant to percussion. Heart: HRRR S1 S2, distant No MRG .  Pulses are 2+ & equal.            No carotid bruit. No JVD.  No abdominal bruits. No femoral bruits. Abdomen: Bowel sounds are  positive, abdomen soft and non-tender without masses or                  Hernia's noted. Msk:  Back normal, normal gait. Normal strength and tone for age. Extremities: No clubbing, cyanosis, 1+ pretibial, 2+ in the feet edema.  DP +1 Neuro: Alert and oriented X 3. Psych:  Good affect, responds appropriately  TELEMETRY: Reviewed telemetry pt in: Atrial fib. HR 90's. ASSESSMENT AND PLAN: 1. Acute on Chronic Diastolic CHF: Diuresis of approximately 29 pounds since admission with substantial improvement in dyspnea.  Obesity is an additional factor in his impaired breathing status and ambulation. He still has mild lower extremity edema, most in his feet. I believe this to be more lymphedema vs. fluid retention. Currently on oral Lasix 40 mg twice a day. Creatinine stable at 0.91.   2. Atrial fib: Rate has been in the 90s. He on Cardizem 60 mg 4 times a day. Will change to long-acting Cardizem at 240 mg daily. Concerned about compliance issues and has not been recommended to be placed on Coumadin at this time as he may not follow up and take his medications as directed. He has failed to appear for appts for several years. If he proves reliable, we will consider full anticoagulation.  3. Right foot pain. He is being treated for gout with steroids, but has been unable to stand without extreme pain.  X-ray pending.  Bettey Mare. Lyman Bishop NP [facility] 05/27/2011, 10:28 AM   Cardiology Attending Patient interviewed and  examined. Discussed with Joni Reining, NP.  Above note annotated and modified based upon my findings.  Pt is improved after substantial diuresis.  Renal function remains normal.   Minimal diuresis today, but negative >2 L yesterday.  Appears to need additional fluid removal-will recheck BNP level and CXR.  Since he is requiring continuing hospitalization, a sleep study could conveniently be performed.  Will check ABG and nocturnal oximetry.  [place] Bing, MD 05/27/2011, 8:09  PM

## 2011-05-27 NOTE — Progress Notes (Signed)
Discussed with physical therapy. Patient refused to bear weight on his left foot because of pain. Apparently, made no attempt whatsoever according to physical therapy.  Subjective: No shortness of breath. No pain in the right leg.  Objective: Vital signs in last 24 hours: Filed Vitals:   05/26/11 2309 05/27/11 0550 05/27/11 0718 05/27/11 1300  BP: 133/63 103/64  115/73  Pulse: 89 94  86  Temp: 98.6 F (37 C) 97.5 F (36.4 C)  98.6 F (37 C)  TempSrc: Oral Oral  Oral  Resp: 24 22  24   Height:      Weight:  255.7 kg (563 lb 11.5 oz)    SpO2: 94% 90% 93% 86%   Weight change: -2 kg (-4 lb 6.6 oz)  Intake/Output Summary (Last 24 hours) at 05/27/11 1522 Last data filed at 05/27/11 1200  Gross per 24 hour  Intake    543 ml  Output   2975 ml  Net  -2432 ml   Physical Exam:  Gen: Talkative. Somewhat tearful. Lungs CTA CV RRR Abd swelling much improved. Ext: Still with pretibial pitting edema, but pedal edema and tenderness improving. Right foot completely nontender. Left foot nontender except near the fifth metatarsal phalangeal joint.  Lab Results: Basic Metabolic Panel:  Lab 05/26/11 4540 05/24/11 0528  NA 142 143  K 4.2 4.3  CL 100 101  CO2 37* 36*  GLUCOSE 105* 119*  BUN 29* 14  CREATININE 0.91 0.98  CALCIUM 10.6* 10.3  MG -- --  PHOS -- --   Liver Function Tests:  Lab 05/22/11 0552  AST 31  ALT 18  ALKPHOS 123*  BILITOT 1.1  PROT 6.5  ALBUMIN 3.3*   No results found for this basename: LIPASE:2,AMYLASE:2 in the last 168 hours No results found for this basename: AMMONIA:2 in the last 168 hours CBC:  Lab 05/22/11 0552 05/21/11 1242  WBC 6.6 5.6  NEUTROABS -- 4.1  HGB 14.6 14.8  HCT 47.2 47.4  MCV 108.5* 107.7*  PLT 191 179   Cardiac Enzymes:  Lab 05/22/11 0915 05/22/11 0118 05/21/11 1849  CKTOTAL 78 100 112  CKMB 1.9 2.1 2.5  CKMBINDEX -- -- --  TROPONINI <0.30 <0.30 <0.30   BNP:  Lab 05/21/11 1242  PROBNP 1490.0*   D-Dimer: No results  found for this basename: DDIMER:2 in the last 168 hours CBG:  Lab 05/21/11 1217  GLUCAP 94   Hemoglobin A1C:  Lab 05/21/11 1849  HGBA1C 5.6   Fasting Lipid Panel: No results found for this basename: CHOL,HDL,LDLCALC,TRIG,CHOLHDL,LDLDIRECT in the last 981 hours Thyroid Function Tests:  Lab 05/21/11 1242  TSH 1.300  T4TOTAL --  FREET4 1.18  T3FREE --  THYROIDAB --   Coagulation:  Lab 05/21/11 1242  LABPROT 13.3  INR 0.99   Anemia Panel:  Lab 05/21/11 1242  VITAMINB12 161*  FOLATE 6.5  FERRITIN --  TIBC --  IRON --  RETICCTPCT --   Urine Drug Screen: Drugs of Abuse  No results found for this basename: labopia,  cocainscrnur,  labbenz,  amphetmu,  thcu,  labbarb    Alcohol Level: No results found for this basename: ETH:2 in the last 168 hours Urinalysis: No results found for this basename: COLORURINE:2,APPERANCEUR:2,LABSPEC:2,PHURINE:2,GLUCOSEU:2,HGBUR:2,BILIRUBINUR:2,KETONESUR:2,PROTEINUR:2,UROBILINOGEN:2,NITRITE:2,LEUKOCYTESUR:2 in the last 168 hours  Scheduled Meds:    . antiseptic oral rinse  15 mL Mouth Rinse BID  . aspirin EC  81 mg Oral Daily  . colchicine  0.6 mg Oral BID  . cyanocobalamin  1,000 mcg Intramuscular Daily  .  diltiazem  240 mg Oral Daily  . enoxaparin  130 mg Subcutaneous Q24H  . furosemide  40 mg Oral BID  . indomethacin  50 mg Oral BID WC  . levalbuterol  1.25 mg Nebulization BID  . lisinopril  5 mg Oral Daily  . mulitivitamin with minerals  1 tablet Oral Daily  . nystatin   Topical BID  . pantoprazole  40 mg Oral Q1200  . predniSONE  40 mg Oral BID WC  . sodium chloride  3 mL Intravenous Q12H  . sodium chloride      . thiamine  100 mg Oral Daily  . DISCONTD: colchicine  0.6 mg Oral Daily  . DISCONTD: diltiazem  60 mg Oral Q6H  . DISCONTD: indomethacin  50 mg Rectal BID  . DISCONTD: predniSONE  40 mg Oral Q breakfast   Continuous Infusions:  PRN Meds:.sodium chloride, acetaminophen, guaiFENesin-dextromethorphan,  levalbuterol, morphine, oxyCODONE, sodium chloride Assessment/Plan: Principal Problem:  *Acute diastolic heart failure Active Problems:  Acute gout  Atrial fibrillation  Bronchospasm  Superficial fungal infection of skin  Hypernatremia  Macrocytosis  Alcohol abuse  Vitamin B12 deficiency  Cor pulmonale, chronic  Gout appears much improved on physical examination. However I will get an x-ray of the left foot to rule out occult fracture. Increase colchicine to twice a day. Increase prednisone to twice a day. Give a few more doses of indomethacin. Patient requires maximal encouragement to get out of bed. Discontinue Foley catheter. Home when able to ambulate a bit better. Patient is self-pay and has no coverage for skilled nursing facility. Congestive heart failure is improved. He has lost 25 pounds since admission. He is 13 L out greater than in. He is approaching maximal hospital benefit. I discussed with the patient for about 30 minutes how he has in fact improved and that he needs to at this point start mobilizing.   LOS: 6 days   Saloma Cadena L 05/27/2011, 3:22 PM

## 2011-05-27 NOTE — Evaluation (Signed)
Physical Therapy Evaluation Patient Details Name: Frederick Brown MRN: [id number] DOB: Jan 05, 1951 Today's Date: 05/27/2011  Problem List:  Patient Active Problem List  Diagnoses  . Bronchospasm  . Atrial fibrillation  . And  . Superficial fungal infection of skin  . Hypernatremia  . Macrocytosis  . Alcohol abuse  . Vitamin B12 deficiency  . Acute diastolic heart failure  . Acute gout  . Cor pulmonale, chronic    Past Medical History:  Past Medical History  Diagnosis Date  . Hypertension   . Obesity, morbid (more than 100 lbs over ideal weight or BMI > 40)   . Degenerative joint disease   . Alcohol abuse   . Umbilical hernia   . Gastroesophageal reflux disease   . Vitamin B12 deficiency 05/22/2011   Past Surgical History:  Past Surgical History  Procedure Date  . Cholecystectomy   . Knee surgery     PT Assessment/Plan/Recommendation PT Assessment Clinical Impression Statement: pt is morbidly obese and is unable to place any weight on L foot due pain on dorsal-lateral aspect...there is mild edema and erythema of the foot but no heat could be felt and he has no pain at rest...he lives alone and states that he has been struggling to  care for himself for the past 3 months (due to generalized weakness)...he states that he has some wounds on posterior thighs caused by poor hygiene and has difficulty getting in and out of bed...in my opinion, his weakness is severe enough to make it questionable as to how he was able to carry his body weight at all  .Marland KitchenMarland KitchenI would highly recommend  a bariatric SNF for this pt at d/c...unless his foot pain resolves, I don't see any way in which he could care for himself at home...will follow daily to try to progress to gait     PT Recommendation/Assessment: Patient will need skilled PT in the acute care venue PT Problem List: Decreased strength;Decreased activity tolerance;Decreased mobility;Obesity;Pain Barriers to Discharge: Decreased caregiver  support PT Therapy Diagnosis : Difficulty walking;Generalized weakness;Acute pain PT Plan PT Frequency: Min 3X/week PT Treatment/Interventions: Gait training;Functional mobility training;Therapeutic activities;Therapeutic exercise;Patient/family education PT Recommendation Follow Up Recommendations: Skilled nursing facility Equipment Recommended: Defer to next venue PT Goals  Acute Rehab PT Goals PT Goal Formulation: With patient Time For Goal Achievement: 7 days Pt will go Supine/Side to Sit: with min assist PT Goal: Supine/Side to Sit - Progress: Goal set today Pt will go Sit to Supine/Side: Independently PT Goal: Sit to Supine/Side - Progress: Goal set today Pt will go Sit to Stand: with modified independence;with upper extremity assist PT Goal: Sit to Stand - Progress: Goal set today Pt will go Stand to Sit: with modified independence;with upper extremity assist PT Goal: Stand to Sit - Progress: Goal set today Pt will Ambulate: 1 - 15 feet;with modified independence;with rolling walker PT Goal: Ambulate - Progress: Goal set today  PT Evaluation Precautions/Restrictions  Precautions Precautions: Fall Required Braces or Orthoses: No Restrictions Weight Bearing Restrictions: No Prior Functioning  Home Living Lives With: Alone Type of Home: House Home Layout: One level Home Access: Stairs to enter Entrance Stairs-Rails: None Entrance Stairs-Number of Steps: 1 Home Adaptive Equipment: Environmental consultant - four wheeled;Walker - rolling;Wheelchair - manual Prior Function Level of Independence: Independent with basic ADLs;Independent with homemaking with ambulation;Independent with gait;Independent with transfers;Requires assistive device for independence Driving: No Vocation: On disability Cognition Cognition Arousal/Alertness: Awake/alert Overall Cognitive Status: Appears within functional limits for tasks assessed Orientation Level: Oriented  X4 Sensation/Coordination Sensation Light Touch: Appears Intact Stereognosis: Not tested Hot/Cold: Not tested Proprioception: Appears Intact Coordination Gross Motor Movements are Fluid and Coordinated: Yes Extremity Assessment RUE Assessment RUE Assessment: Exceptions to [facility] RUE Strength RUE Overall Strength Comments: 3+/5 LUE Assessment LUE Assessment: Exceptions to [facility] LUE Strength LUE Overall Strength Comments: 3+/5 RLE Assessment RLE Assessment: Exceptions to [facility] RLE Strength RLE Overall Strength Comments: 3-/5 LLE Assessment LLE Assessment: Exceptions to [facility] LLE Strength LLE Overall Strength Comments: 3-/5 Mobility (including Balance) Bed Mobility Bed Mobility: Yes Rolling Right: 6: Modified independent (Device/Increase time) Rolling Left: 6: Modified independent (Device/Increase time) Left Sidelying to Sit: 3: Mod assist Transfers Transfers: No (unable to put any wieight on L foot) Ambulation/Gait Ambulation/Gait: No Stairs: No Wheelchair Mobility Wheelchair Mobility: No  Posture/Postural Control Posture/Postural Control: No significant limitations Balance Balance Assessed: No Exercise    End of Session PT - End of Session Activity Tolerance: Patient limited by pain Patient left: in bed;with call bell in reach;with family/visitor present Nurse Communication: Mobility status for transfers;Mobility status for ambulation General Behavior During Session: [facility] for tasks performed Cognition: [facility] for tasks performed  Konrad Penta 05/27/2011, 11:07 AM

## 2011-05-28 ENCOUNTER — Inpatient Hospital Stay (HOSPITAL_COMMUNITY): Payer: Self-pay

## 2011-05-28 ENCOUNTER — Encounter (HOSPITAL_COMMUNITY): Payer: Self-pay | Admitting: Internal Medicine

## 2011-05-28 DIAGNOSIS — S92402A Displaced unspecified fracture of left great toe, initial encounter for closed fracture: Secondary | ICD-10-CM | POA: Diagnosis present

## 2011-05-28 DIAGNOSIS — R609 Edema, unspecified: Secondary | ICD-10-CM

## 2011-05-28 DIAGNOSIS — S92302A Fracture of unspecified metatarsal bone(s), left foot, initial encounter for closed fracture: Secondary | ICD-10-CM

## 2011-05-28 DIAGNOSIS — J9611 Chronic respiratory failure with hypoxia: Secondary | ICD-10-CM

## 2011-05-28 HISTORY — DX: Chronic respiratory failure with hypoxia: J96.11

## 2011-05-28 HISTORY — DX: Displaced unspecified fracture of left great toe, initial encounter for closed fracture: S92.402A

## 2011-05-28 HISTORY — DX: Fracture of unspecified metatarsal bone(s), left foot, initial encounter for closed fracture: S92.302A

## 2011-05-28 LAB — BLOOD GAS, ARTERIAL
Acid-Base Excess: 10.8 mmol/L — ABNORMAL HIGH (ref 0.0–2.0)
Bicarbonate: 36 meq/L — ABNORMAL HIGH (ref 20.0–24.0)
O2 Saturation: 87.9 %
Patient temperature: 37
TCO2: 31.7 mmol/L (ref 0–100)
pCO2 arterial: 60.1 mmHg (ref 35.0–45.0)
pH, Arterial: 7.395 (ref 7.350–7.450)
pO2, Arterial: 53.7 mmHg — ABNORMAL LOW (ref 80.0–100.0)

## 2011-05-28 LAB — CREATININE, SERUM
GFR calc Af Amer: >90 mL/min (ref >90)
GFR calc non Af Amer: >90 mL/min (ref >90)

## 2011-05-28 LAB — PRO B NATRIURETIC PEPTIDE: Pro B Natriuretic peptide (BNP): 1510 pg/mL — ABNORMAL HIGH (ref 0–125)

## 2011-05-28 MED ORDER — METOPROLOL TARTRATE 25 MG PO TABS
12.5000 mg | ORAL_TABLET | Freq: Two times a day (BID) | ORAL | Status: DC
  Administered 2011-05-28 – 2011-05-31 (×7): 12.5 mg via ORAL
  Filled 2011-05-28 (×7): qty 1

## 2011-05-28 MED ORDER — FLUCONAZOLE 100 MG PO TABS
150.0000 mg | ORAL_TABLET | Freq: Every day | ORAL | Status: AC
  Administered 2011-05-28 – 2011-05-29 (×2): 150 mg via ORAL
  Filled 2011-05-28 (×2): qty 2

## 2011-05-28 MED ORDER — METOLAZONE 5 MG PO TABS
5.0000 mg | ORAL_TABLET | Freq: Once | ORAL | Status: AC
  Administered 2011-05-28: 5 mg via ORAL
  Filled 2011-05-28: qty 1

## 2011-05-28 MED ORDER — FUROSEMIDE 80 MG PO TABS
80.0000 mg | ORAL_TABLET | Freq: Two times a day (BID) | ORAL | Status: DC
  Administered 2011-05-29: 80 mg via ORAL
  Filled 2011-05-28: qty 1

## 2011-05-28 NOTE — Progress Notes (Signed)
Subjective: He continues to have pain in his left foot particularly on the plantar surface and great toe. No complaints of worsening shortness of breath or chest congestion.  Objective: Vital signs in last 24 hours: Filed Vitals:   05/28/11 0419 05/28/11 0653 05/28/11 0700 05/28/11 0842  BP:  125/76    Pulse:  102  100  Temp:  97.8 F (36.6 C)    TempSrc:  Oral    Resp:  18  20  Height:      Weight:      SpO2: 94% 92% 95% 93%    Intake/Output Summary (Last 24 hours) at 05/28/11 1328 Last data filed at 05/28/11 0800  Gross per 24 hour  Intake    483 ml  Output   1150 ml  Net   -667 ml    Weight change:   Physical exam: General: Morbidly obese man sitting up on the side of the bed. No acute distress. Lungs: Globally decreased breath sounds. Heart: Distant S1, S2, with borderline tachycardia. Abdomen: Morbidly obese, positive bowel sounds, nontender. Extremities: 1+ bilateral pedal edema. Left foot tenderness over the second and third metatarsal area. Tenderness upon palpation of the left great toe. Patient is able to flex and extend foot without discomfort. Neurologic: He is alert and oriented x2.  Lab Results: Basic Metabolic Panel:  Basename 05/28/11 0617 05/26/11 0600  NA -- 142  K -- 4.2  CL -- 100  CO2 -- 37*  GLUCOSE -- 105*  BUN -- 29*  CREATININE 0.77 0.91  CALCIUM -- 10.6*  MG -- --  PHOS -- --   Liver Function Tests: No results found for this basename: AST:2,ALT:2,ALKPHOS:2,BILITOT:2,PROT:2,ALBUMIN:2 in the last 72 hours No results found for this basename: LIPASE:2,AMYLASE:2 in the last 72 hours No results found for this basename: AMMONIA:2 in the last 72 hours CBC: No results found for this basename: WBC:2,NEUTROABS:2,HGB:2,HCT:2,MCV:2,PLT:2 in the last 72 hours Cardiac Enzymes: No results found for this basename: CKTOTAL:3,CKMB:3,CKMBINDEX:3,TROPONINI:3 in the last 72 hours BNP:  Basename 05/28/11 0619  PROBNP 1510.0*   D-Dimer: No results  found for this basename: DDIMER:2 in the last 72 hours CBG: No results found for this basename: GLUCAP:6 in the last 72 hours Hemoglobin A1C: No results found for this basename: HGBA1C in the last 72 hours Fasting Lipid Panel: No results found for this basename: CHOL,HDL,LDLCALC,TRIG,CHOLHDL,LDLDIRECT in the last 72 hours Thyroid Function Tests: No results found for this basename: TSH,T4TOTAL,FREET4,T3FREE,THYROIDAB in the last 72 hours Anemia Panel: No results found for this basename: VITAMINB12,FOLATE,FERRITIN,TIBC,IRON,RETICCTPCT in the last 72 hours Coagulation: No results found for this basename: LABPROT:2,INR:2 in the last 72 hours Urine Drug Screen: Drugs of Abuse  No results found for this basename: labopia, cocainscrnur, labbenz, amphetmu, thcu, labbarb    Alcohol Level: No results found for this basename: ETH:2 in the last 72 hours Urinalysis: No results found for this basename: COLORURINE:2,APPERANCEUR:2,LABSPEC:2,PHURINE:2,GLUCOSEU:2,HGBUR:2,BILIRUBINUR:2,KETONESUR:2,PROTEINUR:2,UROBILINOGEN:2,NITRITE:2,LEUKOCYTESUR:2 in the last 72 hours Misc. Labs:  Micro: No results found for this or any previous visit (from the past 240 hour(s)).  Studies/Results: Dg Foot Complete Left  05/27/2011  *RADIOLOGY REPORT*  Clinical Data: Foot pain and swelling for 3 days.  No known injury. Limited lower extremity range of motion.  Obesity.  LEFT FOOT - COMPLETE 3+ VIEW  Comparison: None.  Findings: There is sclerosis and irregularity of the second metatarsal head.  Findings are consistent with a subacute fracture in this region.  There is a comminuted acute fracture at the base of the great toe, proximal phalanx.  Large plantar calcaneal spur is identified.  IMPRESSION:  1.  Subacute fracture of the second metatarsal head. 2.  Acute, comminuted fracture of the base of the proximal phalanx of the great toe.  Original Report Authenticated By: Patterson Hammersmith, M.D.    Medications: I have  reviewed the patient's current medications.  Assessment: Principal Problem:  *Acute diastolic heart failure Active Problems:  Bronchospasm  Atrial fibrillation  Superficial fungal infection of skin  Hypernatremia  Macrocytosis  Alcohol abuse  Vitamin B12 deficiency  Cor pulmonale, chronic  Fracture of metatarsal of left foot, closed  Fracture of great toe, left, closed  Chronic respiratory failure with hypoxia  The patient is noted to have left great toe fracture and second metatarsal head fracture which is likely where his pain is coming from. Doubt gout. His weight and the fracture are contributing to difficult physical therapy and ambulation.  He continues to diuresed on Lasix. His heart rate is a little above 100 beats per minute on Cardizem. He is not a Coumadin candidate. Cardiology is following the patient and making changes in his medication regimen.  His ABG on room air is noted for pH of 7.39, PCO2 of 60, and PO2 of 54. This is indicative of chronic hypoxic and hypercapnic respiratory failure. It is in part secondary to obesity hypoventilation syndrome. He is noted to have chronic cor pulmonale. He is certainly eligible for home oxygen.  Disposition may be an issue. The plan is to try to get him home with assistance as needed.   Plan:  1. Will order home health oxygen. 2. Consult orthopedic M.D. on call. 3. Further recommendations per cardiology.   LOS: 7 days   Frederick Brown 05/28/2011, 1:28 PM

## 2011-05-28 NOTE — Plan of Care (Signed)
Problem: Phase II Progression Outcomes Goal: Case manager referral Outcome: Not Progressing According to report, pt is refusing social work consult.

## 2011-05-28 NOTE — Progress Notes (Addendum)
SUBJECTIVE:Complaints of not sleeping well last night. Continued left great toe pain.  Principal Problem:  *Acute diastolic heart failure Active Problems:  Bronchospasm  Atrial fibrillation  Superficial fungal infection of skin  Hypernatremia  Macrocytosis  Alcohol abuse  Vitamin B12 deficiency  Acute gout  Cor pulmonale, chronic   LABS: Basic Metabolic Panel:  Basename 05/28/11 0617 05/26/11 0600  NA -- 142  K -- 4.2  CL -- 100  CO2 -- 37*  GLUCOSE -- 105*  BUN -- 29*  CREATININE 0.77 0.91  CALCIUM -- 10.6*  MG -- --  PHOS -- --    RADIOLOGY: Dg Foot Complete Left  05/27/2011  *RADIOLOGY REPORT*  Clinical Data: Foot pain and swelling for 3 days.  No known injury. Limited lower extremity range of motion.  Obesity.  LEFT FOOT - COMPLETE 3+ VIEW  Comparison: None.  Findings: There is sclerosis and irregularity of the second metatarsal head.  Findings are consistent with a subacute fracture in this region.  There is a comminuted acute fracture at the base of the great toe, proximal phalanx.  Large plantar calcaneal spur is identified.  IMPRESSION:  1.  Subacute fracture of the second metatarsal head. 2.  Acute, comminuted fracture of the base of the proximal phalanx of the great toe.  Original Report Authenticated By: Patterson Hammersmith, M.D.     PHYSICAL EXAM BP 125/76  Pulse 100  Temp(Src) 97.8 F (36.6 C) (Oral)  Resp 20  Ht 5\' 10"  (1.778 m)  Wt 563 lb 11.5 oz (255.7 kg)  BMI 80.88 kg/m2  SpO2 93% General: Well developed, well nourished, in no acute distress.Significant body oder noted, Head: Eyes PERRLA, No xanthomas.   Normal cephalic and atramatic  Lungs: Clear bilaterally to auscultation and percussion. Diminished in the bases. Heart: HRIR S1 S2, No MRG .  Pulses are 2+ & equal.            No carotid bruit. No JVD.  No abdominal bruits. No femoral bruits. Abdomen: Bowel sounds are positive, abdomen soft and non-tender without masses or    Hernia's noted. Msk:  Back normal,generalized deconditioning.Left great toe painful with mild swelling. Extremities: No clubbing, cyanosis or edema.  DP +1 Neuro: Alert and oriented X 3. Psych:  Good affect, responds appropriately  TELEMETRY: Reviewed telemetry pt in: Atrial fib rates in the low 100 to 90's.  CXR:  Cardiomegaly; decreased pulmonary edema  BNP:  1510, unchanged  ASSESSMENT AND PLAN:  1. Diastolic CHF: Diuresis has slowed down with by mouth Lasix, however, he continues to have substantial lower extremity edema and wheezes. We will give 1 dose of metolazone 5 mg today only. Low-salt diet. education. We will have followup. She met in the a.m. to evaluate kidney function.  2. Atrial fibrillation. Heart rate is labile in the low 100s to 80s. He is currently on Cardizem 240 mg by mouth daily. BP is stable in the 120s to 1:15 systolic. He may need addition of rate reducing medications, low-dose metoprolol. This will assist in better diuresis and breathing status with more controlled heart rate. Will evaluate for wheezing in the setting. He is not a Coumadin candidate.  3. Left great toe fracture: Noted on x-ray yesterday secondary to continued left foot pain. This has been discussed with Dr. Sherrie Mustache, who plans an orthopedic consultation.  4. Rule out UTI. Patient has significant body odor and urine odor. The catheter has just been discontinued. We will send for UA and for evaluation of  this. He will be given antibacterial bath.  Bettey Mare. Lyman Bishop NP [facility] 05/28/2011, 9:41 AM   Cardiology Attending Patient interviewed and examined. Discussed with Joni Reining, NP.  Above note annotated and modified based upon my findings.  Chest x-ray shows improved pulmonary edema, but BNP level is unchanged. The BNP elevation may partially reflect patient's pulmonary problems and cor pulmonale with pickwickian syndrome.  I&O +400 cc over the past 2 days.   No weight today.   Will increase furosemide dose.  Electrolytes and renal function are normal-continue to monitor.  [place] Bing, MD 05/28/2011, 6:47 PM

## 2011-05-28 NOTE — Progress Notes (Signed)
Physical Therapy Treatment Patient Details Name: Frederick Brown MRN: [id number] DOB: 1950-08-13 Today's Date: 05/28/2011  PT Assessment/Plan  PT - Assessment/Plan Comments on Treatment Session: pt more cooperative today, although it took several visits to his room before he felt ready to work with me...he had no major c/o pain in LLE until he had walked about 4' with walker, upon which he got into chair...mobility is going to be extremely limited due to obesity and toe fx...he will definately need HHPT if he goes home at d/c PT Plan: Frequency remains appropriate;Discharge plan needs to be updated Follow Up Recommendations: Home health PT;Skilled nursing facility Equipment Recommended: Defer to next venue PT Goals  Acute Rehab PT Goals PT Goal: Supine/Side to Sit - Progress: Progressing toward goal PT Goal: Sit to Stand - Progress: Met PT Goal: Stand to Sit - Progress: Met PT Goal: Ambulate - Progress: Progressing toward goal  PT Treatment Precautions/Restrictions  Precautions Precautions: Fall Required Braces or Orthoses: No Restrictions Weight Bearing Restrictions: No Mobility (including Balance) Bed Mobility Rolling Right: 6: Modified independent (Device/Increase time) Rolling Left: 6: Modified independent (Device/Increase time) Left Sidelying to Sit: 3: Mod assist Left Sidelying to Sit Details (indicate cue type and reason): able to move LEs over EOB, but unable to roll trunk far enough to the L as pannus gets in his way...he sleeps in a regular bed at home,, and will abnticipate that HHPT can work with him on bed transfers using his own equipment...he may ultimatly need a hospital bed Transfers Transfers: Yes Sit to Stand: 6: Modified independent (Device/Increase time) (roks his trunk back and forth several times to get to stance) Stand to Sit: 6: Modified independent (Device/Increase time) Ambulation/Gait Ambulation/Gait: Yes Ambulation/Gait Assistance: 5:  Supervision Ambulation/Gait Assistance Details (indicate cue type and reason): pt able to ambulate with bariatric walker very tentatively on LLE lost balance slightly to the L and needed to be stabilized...endurance is very poor Ambulation Distance (Feet): 6 Feet Assistive device: Rolling walker Gait Pattern: Decreased stance time - left;Shuffle Stairs: No Wheelchair Mobility Wheelchair Mobility: No    Exercise  General Exercises - Lower Extremity Ankle Circles/Pumps: AROM;Strengthening;Both;10 reps;Supine Quad Sets: AROM;Both;10 reps;Supine Gluteal Sets: AROM;Both;10 reps;Supine Short Arc Quad: Strengthening;Right;10 reps;Supine (unable to position LLE correctly to do ex) Heel Slides: AROM;Strengthening;Right;10 reps;Supine Hip ABduction/ADduction: AROM;Strengthening;Right;10 reps;Supine Straight Leg Raises: AROM;Right;5 reps;Supine End of Session PT - End of Session Equipment Utilized During Treatment: Gait belt Activity Tolerance: Patient tolerated treatment well Patient left: in chair;with call bell in reach Nurse Communication: Mobility status for transfers;Mobility status for ambulation General Behavior During Session: [facility] for tasks performed  Konrad Penta 05/28/2011, 3:19 PM

## 2011-05-28 NOTE — Progress Notes (Addendum)
0030-Pt placed on continuous pulse ox monitor per order. Pt reading in 70's when initially placed on monitor, likely machine warming up. 2L of O2 brought pt up only into the 80's. 3-3.5L of O2 placed on pt and he gets up to 94%initially and then drops again periodically.  Notified RT to be advised if pt may need face mask since he seems to be a mouth breather during the night. RT said to keep watching and notify again if O2 continued to drop, and to elevate HOB because pt seemed to obstruct airway due to his size when lying flat. Pts head raised and O2 continued at 3.5L. O2 up to 95%, dropped down to 90% at times. Nursing will monitor. Deberah Castle Warner  O2 reached up to 99% during the night while on 3.5 liters, pulse oximetry alarmed at times for O2 of 91% or less, I only observed 89% one time during the night. This got better throughout the night. At 0540, RT consulted, decided to wean to 2L and monitor. ABG to be drawn this am, RT will remove O2 in time to get accurate RA reading. Sheryn Bison

## 2011-05-29 LAB — URINE MICROSCOPIC-ADD ON

## 2011-05-29 LAB — BASIC METABOLIC PANEL
BUN: 27 mg/dL — ABNORMAL HIGH (ref 6–23)
CO2: 38 mEq/L — ABNORMAL HIGH (ref 19–32)
Calcium: 10.7 mg/dL — ABNORMAL HIGH (ref 8.4–10.5)
Chloride: 96 mEq/L (ref 96–112)
Creatinine, Ser: 0.74 mg/dL (ref 0.50–1.35)
Glucose, Bld: 117 mg/dL — ABNORMAL HIGH (ref 70–99)

## 2011-05-29 LAB — URINALYSIS, ROUTINE W REFLEX MICROSCOPIC
Bilirubin Urine: NEGATIVE
Glucose, UA: NEGATIVE mg/dL
Ketones, ur: NEGATIVE mg/dL
Protein, ur: NEGATIVE mg/dL
pH: 6.5 (ref 5.0–8.0)

## 2011-05-29 MED ORDER — FUROSEMIDE 80 MG PO TABS
120.0000 mg | ORAL_TABLET | Freq: Two times a day (BID) | ORAL | Status: DC
  Administered 2011-05-29 – 2011-05-30 (×2): 120 mg via ORAL
  Filled 2011-05-29 (×2): qty 1

## 2011-05-29 NOTE — Progress Notes (Signed)
PATIENT REFUSED HIS TREATMENT HE STATES THEY ARE NOT DOING ANY GOOD

## 2011-05-29 NOTE — Progress Notes (Signed)
Brief Nutrition Note   Reason: patient with length of stay/ Follow up  Provided diet education to patient on 05/22/2011. Patient verbalized understanding. Reviewed patient's chart. Patient eating well, PO documented 100%. Patient on Cardiac Diet.  No nutrition concerns at this time.  RD to monitor for nutrition needs.  Iven Finn, MS, RD, LDN Pager [phone]

## 2011-05-29 NOTE — Progress Notes (Signed)
Subjective:  Breathing easier. No other cardiac complaints  Objective:  Vital Signs in the last 24 hours: Temp:  [97.1 F (36.2 C)-98.4 F (36.9 C)] 97.9 F (36.6 C) (02/27 0551) Pulse Rate:  [83-100] 83  (02/27 0551) Resp:  [20] 20  (02/27 0551) BP: (111-129)/(68-77) 129/77 mmHg (02/27 0551) SpO2:  [93 %-96 %] 96 % (02/27 0551) Weight:  [554 lb 7.3 oz (251.5 kg)] 554 lb 7.3 oz (251.5 kg) (02/27 0551)  Intake/Output from previous day: 02/26 0701 - 02/27 0700 In: 720 [P.O.:720] Out: -   Physical Exam: NECK: Without JVD, HJR, or bruit LUNGS:Decreased breath sounds but difficult to hear because of obesity Clear anterior, posterior, lateral HEART: Regular rate and rhythm, no murmur, gallop, rub, bruit, thrill, or heave EXTREMITIES: +1-2 pitting edema of the lower extremities  Lab Results: No results found for this basename: WBC:2,HGB:2,PLT:2 in the last 72 hours  Basename 05/29/11 0651 05/28/11 0617  NA 138 --  K 4.2 --  CL 96 --  CO2 38* --  GLUCOSE 117* --  BUN 27* --  CREATININE 0.74 0.77   Assessment/Plan:  Diastolic heart failure: I&O's are +700 cc despite increase in p.o. Lasix 80 mg p.o. B.i.d.Has lost 37 pounds since admission. Patient still has pitting edema. We will increase his Lasix.  Fractured great toe: hard sole shoe 3 weeks  Morbid obesity: major problem  LOS: 8 days   Jacolyn Reedy 05/29/2011, 8:13 AM    Cardiology Attending Patient interviewed and examined. Discussed with Jacolyn Reedy, PA-C.  Above note annotated and modified based upon my findings.  Marcell continues to do well with an additional 4 kg weight loss and 2 L diuresis. He was not really given an adequate trial on a dose of 80 mg of furosemide twice a day, but appears to be doing well on 120 mg, which will be continued for now. Although he will benefit from additional diuresis, his exact dry weight is unknown, and additional adjustment of medications can be managed without continued  hospitalization.  Aubrey Bing, MD 05/29/2011, 9:22 PM

## 2011-05-29 NOTE — Progress Notes (Signed)
Physical Therapy Treatment Patient Details Name: Frederick Brown MRN: [id number] DOB: [birth year] Today's Date: 05/29/2011 Time in: 15:30 Time out:16:10 Charges: there act 25', gait 10   PT Assessment/Plan  PT - Assessment/Plan Comments on Treatment Session: Pt. compliant with mobility/gait today.  Pt. able to transfer self to standing from supine with increased time using body momentum to achieve.  Pt. amb. 10 feet to recliner using bariatric walker with supervision.  Pt. concerned with hygiene assistance when discharged (i.e.using restroom)  Explained he would need a bariatric BSC/assistance. PT Plan: Other (comment) (Continue per PT POC.)  PT Treatment Precautions/Restrictions  Precautions Precautions: Fall Required Braces or Orthoses: No Restrictions Weight Bearing Restrictions: No Mobility (including Balance) Bed Mobility Left Sidelying to Sit: 3: Mod assist Left Sidelying to Sit Details (indicate cue type and reason): Pt. transferred supine to sit using HOB elevation to achieve; could not convince pt. to try with bed flat (as at home). Transfers Sit to Stand: 6: Modified independent (Device/Increase time) (Rocks trunk back and forth to build momentum to stand) Stand to Sit: 6: Modified independent (Device/Increase time) (VC's for hand placement/safety) Ambulation/Gait Ambulation/Gait: Yes Ambulation/Gait Assistance: 5: Supervision Ambulation/Gait Assistance Details (indicate cue type and reason): Pt. ambulated X 10 feet to recliner using bariatric RW; no LOB but very small, labored steps taken. Ambulation Distance (Feet): 10 Feet Gait Pattern: Decreased step length - left;Decreased step length - right;Decreased hip/knee flexion - right;Decreased hip/knee flexion - left    End of Session PT - End of Session Activity Tolerance: Patient limited by fatigue;Patient tolerated treatment well;Treatment limited secondary to medical complications (Comment) (obesity) Patient left: in  chair;with call bell in reach General Behavior During Session: [facility] for tasks performed Cognition: [facility] for tasks performed  Anothony Bursch B. Bascom Levels, PTA 05/29/2011, 4:36 PM

## 2011-05-29 NOTE — Consult Note (Signed)
Reason for Consult: Left great toe, left second toe. Referring Physician: Dr. Hope Budds is an 61 y.o. male.  HPI: -year-old male with multiple medical problems presents with subacute fractures of the left great toe and left second toe. He indicates he was originally treated for gout but subsequent x-rays showed a fracture. He is having difficulty walking with pain with weightbearing. The pain is nonradiating primarily the pain is over the great toe and second digit however he is also having lateral foot pain greater than the pain over the fractures.  Past Medical History  Diagnosis Date  . Hypertension   . Obesity, morbid (more than 100 lbs over ideal weight or BMI > 40)   . Degenerative joint disease   . Alcohol abuse   . Umbilical hernia   . Gastroesophageal reflux disease   . Vitamin B12 deficiency 05/22/2011  . Fracture of metatarsal of left foot, closed 05/28/2011  . Fracture of great toe, left, closed 05/28/2011  . Chronic respiratory failure with hypoxia 05/28/2011    Past Surgical History  Procedure Date  . Cholecystectomy   . Knee surgery     Family History  Problem Relation Age of Onset  . Heart attack Father   . Heart failure Father   . Stroke Father   . Hypertension Father     Social History:  reports that he has never smoked. He does not have any smokeless tobacco history on file. He reports that he drinks alcohol. He reports that he does not use illicit drugs.  Allergies: No Known Allergies  Medications: I have reviewed the patient's current medications.  Results for orders placed during the hospital encounter of 05/21/11 (from the past 48 hour(s))  CREATININE, SERUM     Status: Normal   Collection Time   05/28/11  6:17 AM      Component Value Range Comment   Creatinine, Ser 0.77  0.50 - 1.35 (mg/dL)    GFR calc non Af Amer >90  >90 (mL/min)    GFR calc Af Amer >90  >90 (mL/min)   PRO B NATRIURETIC PEPTIDE     Status: Abnormal   Collection Time     05/28/11  6:19 AM      Component Value Range Comment   Pro B Natriuretic peptide (BNP) 1510.0 (*) 0 - 125 (pg/mL)   BLOOD GAS, ARTERIAL     Status: Abnormal   Collection Time   05/28/11  7:28 AM      Component Value Range Comment   O2 Content ROOM AIR      pH, Arterial 7.395  7.350 - 7.450     pCO2 arterial 60.1 (*) 35.0 - 45.0 (mmHg)    pO2, Arterial 53.7 (*) 80.0 - 100.0 (mmHg)    Bicarbonate 36.0 (*) 20.0 - 24.0 (mEq/L)    TCO2 31.7  0 - 100 (mmol/L)    Acid-Base Excess 10.8 (*) 0.0 - 2.0 (mmol/L)    O2 Saturation 87.9      Patient temperature 37.0      Collection site RIGHT RADIAL      Drawn by COLLECTED BY RT      Sample type ARTERIAL      Allens test (pass/fail) PASS  PASS      Dg Chest Port 1 View  05/28/2011  *RADIOLOGY REPORT*  Clinical Data: Congestive heart failure.  PORTABLE CHEST - 1 VIEW  Comparison: Portable chest 05/21/2011.  Findings: Again seen is cardiomegaly.  Pulmonary vascular congestion is  present.  Edema is decreased.  No pneumothorax or effusion.  IMPRESSION: Decreased pulmonary edema.  Cardiomegaly and vascular congestion.  Original Report Authenticated By: Bernadene Bell. D'ALESSIO, M.D.   Dg Foot Complete Left  05/27/2011  *RADIOLOGY REPORT*  Clinical Data: Foot pain and swelling for 3 days.  No known injury. Limited lower extremity range of motion.  Obesity.  LEFT FOOT - COMPLETE 3+ VIEW  Comparison: None.  Findings: There is sclerosis and irregularity of the second metatarsal head.  Findings are consistent with a subacute fracture in this region.  There is a comminuted acute fracture at the base of the great toe, proximal phalanx.  Large plantar calcaneal spur is identified.  IMPRESSION:  1.  Subacute fracture of the second metatarsal head. 2.  Acute, comminuted fracture of the base of the proximal phalanx of the great toe.  Original Report Authenticated By: Patterson Hammersmith, M.D.    ROS No findings on review of systems. See dictated H/P   Blood pressure  129/77, pulse 83, temperature 97.9 F (36.6 C), temperature source Oral, resp. rate 20, height 5\' 10"  (1.778 m), weight 251.5 kg (554 lb 7.3 oz), SpO2 96.00%. Physical Exam  Constitutional: He is oriented to person, place, and time. No distress.       Morbidly obese  Musculoskeletal: Normal range of motion.       Feet:       Upper extremity exam  Inspection and palpation revealed no abnormalities in the upper extremities.  Range of motion is full without contracture.  Motor exam is normal with grade 5 strength.  The joints are fully reduced without subluxation.  There is no atrophy or tremor and muscle tone is normal.  All joints are stable.    RIGHT Lower extremity exam  Inspection and palpation revealed no tenderness or abnormality in alignment in the lower extremities. Range of motion is full.  Strength is grade 5.  AND all joints are stable.  Neurological: He is alert and oriented to person, place, and time.  Skin: Skin is warm and dry.  Psychiatric: He has a normal mood and affect. His behavior is normal. Judgment and thought content normal.    Assessment/Plan: The radiographs show a marginal fracture of the base of the proximal phalanx of the great toe and a second metatarsal head fracture which appeared to be subacute.  Recommend hard sole shoe for 3 weeks. Weight bear as tolerated.  No follow up needed   Fuller Canada 05/29/2011, 6:38 AM

## 2011-05-29 NOTE — Progress Notes (Signed)
Subjective: His only complaint this morning is that of urinary incontinence with the increase in Lasix dosing. No other new complaints.  Objective: Vital signs in last 24 hours: Filed Vitals:   05/28/11 1400 05/28/11 2012 05/28/11 2106 05/29/11 0551  BP: 124/72  111/68 129/77  Pulse: 98  92 83  Temp: 97.1 F (36.2 C)  98.4 F (36.9 C) 97.9 F (36.6 C)  TempSrc: Oral  Oral Oral  Resp: 20  20 20   Height:      Weight:    251.5 kg (554 lb 7.3 oz)  SpO2: 94% 93% 93% 96%    Intake/Output Summary (Last 24 hours) at 05/29/11 1140 Last data filed at 05/29/11 0830  Gross per 24 hour  Intake    720 ml  Output      0 ml  Net    720 ml    Weight change:   Physical exam: General: No acute distress. Lungs: Globally decreased breath sounds. Heart: Distant irregular irregular. Abdomen: Morbidly obese, positive bowel sounds, nontender. Extremities: 1+ bilateral pedal edema. Left foot tenderness over the second and third metatarsal area. Tenderness upon palpation of the left great toe. Patient is able to flex and extend foot without discomfort. Neurologic: He is alert and oriented x2.  Lab Results: Basic Metabolic Panel:  Basename 05/29/11 0651 05/28/11 0617  NA 138 --  K 4.2 --  CL 96 --  CO2 38* --  GLUCOSE 117* --  BUN 27* --  CREATININE 0.74 0.77  CALCIUM 10.7* --  MG -- --  PHOS -- --   Liver Function Tests: No results found for this basename: AST:2,ALT:2,ALKPHOS:2,BILITOT:2,PROT:2,ALBUMIN:2 in the last 72 hours No results found for this basename: LIPASE:2,AMYLASE:2 in the last 72 hours No results found for this basename: AMMONIA:2 in the last 72 hours CBC: No results found for this basename: WBC:2,NEUTROABS:2,HGB:2,HCT:2,MCV:2,PLT:2 in the last 72 hours Cardiac Enzymes: No results found for this basename: CKTOTAL:3,CKMB:3,CKMBINDEX:3,TROPONINI:3 in the last 72 hours BNP:  Basename 05/28/11 0619  PROBNP 1510.0*   D-Dimer: No results found for this basename:  DDIMER:2 in the last 72 hours CBG: No results found for this basename: GLUCAP:6 in the last 72 hours Hemoglobin A1C: No results found for this basename: HGBA1C in the last 72 hours Fasting Lipid Panel: No results found for this basename: CHOL,HDL,LDLCALC,TRIG,CHOLHDL,LDLDIRECT in the last 72 hours Thyroid Function Tests: No results found for this basename: TSH,T4TOTAL,FREET4,T3FREE,THYROIDAB in the last 72 hours Anemia Panel: No results found for this basename: VITAMINB12,FOLATE,FERRITIN,TIBC,IRON,RETICCTPCT in the last 72 hours Coagulation: No results found for this basename: LABPROT:2,INR:2 in the last 72 hours Urine Drug Screen: Drugs of Abuse  No results found for this basename: labopia,  cocainscrnur,  labbenz,  amphetmu,  thcu,  labbarb    Alcohol Level: No results found for this basename: ETH:2 in the last 72 hours Urinalysis: No results found for this basename: COLORURINE:2,APPERANCEUR:2,LABSPEC:2,PHURINE:2,GLUCOSEU:2,HGBUR:2,BILIRUBINUR:2,KETONESUR:2,PROTEINUR:2,UROBILINOGEN:2,NITRITE:2,LEUKOCYTESUR:2 in the last 72 hours Misc. Labs:  Micro: No results found for this or any previous visit (from the past 240 hour(s)).  Studies/Results: Dg Chest Port 1 View  05/28/2011  *RADIOLOGY REPORT*  Clinical Data: Congestive heart failure.  PORTABLE CHEST - 1 VIEW  Comparison: Portable chest 05/21/2011.  Findings: Again seen is cardiomegaly.  Pulmonary vascular congestion is present.  Edema is decreased.  No pneumothorax or effusion.  IMPRESSION: Decreased pulmonary edema.  Cardiomegaly and vascular congestion.  Original Report Authenticated By: Bernadene Bell. D'ALESSIO, M.D.   Dg Foot Complete Left  05/27/2011  *RADIOLOGY REPORT*  Clinical Data:  Foot pain and swelling for 3 days.  No known injury. Limited lower extremity range of motion.  Obesity.  LEFT FOOT - COMPLETE 3+ VIEW  Comparison: None.  Findings: There is sclerosis and irregularity of the second metatarsal head.  Findings are  consistent with a subacute fracture in this region.  There is a comminuted acute fracture at the base of the great toe, proximal phalanx.  Large plantar calcaneal spur is identified.  IMPRESSION:  1.  Subacute fracture of the second metatarsal head. 2.  Acute, comminuted fracture of the base of the proximal phalanx of the great toe.  Original Report Authenticated By: Patterson Hammersmith, M.D.    Medications: I have reviewed the patient's current medications.  Assessment: Principal Problem:  *Acute diastolic heart failure Active Problems:  Bronchospasm  Atrial fibrillation  Superficial fungal infection of skin  Hypernatremia  Macrocytosis  Alcohol abuse  Vitamin B12 deficiency  Cor pulmonale, chronic  Fracture of metatarsal of left foot, closed  Fracture of great toe, left, closed  Chronic respiratory failure with hypoxia  Hypercalcemia  The patient is noted to have left great toe fracture and second metatarsal head fracture which is likely where his pain is coming from. Doubt gout. His weight and the fractures are contributing to difficult physical therapy and ambulation. Dr. Romeo Apple evaluated the patient and recommended a hard sole shoe for 3 weeks and weight-bearing as tolerated. The physical therapist also evaluated the patient and recommended home health physical therapy which will be ordered.  He continues to diurese on Lasix, although he is incontinent. Cardiology increased the dosing of furosemide. His heart rate is has improved. He is not a Coumadin candidate.  His ABG on room air was noted for pH of 7.39, PCO2 of 60, and PO2 of 54. This is indicative of chronic hypoxic and hypercapnic respiratory failure. It is in part secondary to obesity hypoventilation syndrome/ chronic cor pulmonale/pickwickian syndrome.Marland Kitchen He is  eligible for home oxygen, but will recheck an ABG tomorrow before planned discharge.  The patient's serum calcium is slightly elevated. It may secondary to mild  intravascular volume depletion in the setting of peripheral body edema. It will be monitored. It is not in the toxic range.  Disposition is an issue. The patient probably needs skilled nursing facility placement for rehabilitation, however, he has no insurance and there is no obvious payer source other than self pain. The plan is to try to get him home with assistance as needed. Discharge probably tomorrow.   Plan:  1. Hard shoe for his foot fracture has been ordered. 2. We'll check another ABG tomorrow on room air to see if he qualifies for home oxygen. 3. We'll ask the nursing staff to try a condom catheter.   LOS: 8 days   Ladasha Schnackenberg 05/29/2011, 11:40 AM

## 2011-05-29 NOTE — Progress Notes (Signed)
UR Chart Review Completed  

## 2011-05-30 DIAGNOSIS — N390 Urinary tract infection, site not specified: Secondary | ICD-10-CM | POA: Diagnosis not present

## 2011-05-30 LAB — BASIC METABOLIC PANEL
BUN: 28 mg/dL — ABNORMAL HIGH (ref 6–23)
CO2: 41 mEq/L (ref 19–32)
Calcium: 10.6 mg/dL — ABNORMAL HIGH (ref 8.4–10.5)
Creatinine, Ser: 0.82 mg/dL (ref 0.50–1.35)
GFR calc non Af Amer: >90 mL/min (ref >90)
Glucose, Bld: 85 mg/dL (ref 70–99)
Sodium: 141 mEq/L (ref 135–145)

## 2011-05-30 LAB — BLOOD GAS, ARTERIAL
FIO2: 21 %
O2 Saturation: 85.7 %
pH, Arterial: 7.457 — ABNORMAL HIGH (ref 7.350–7.450)
pO2, Arterial: 47.5 mmHg — ABNORMAL LOW (ref 80.0–100.0)

## 2011-05-30 MED ORDER — SODIUM CHLORIDE 0.9 % IJ SOLN
INTRAMUSCULAR | Status: AC
  Administered 2011-05-30: 10 mL
  Filled 2011-05-30: qty 3

## 2011-05-30 MED ORDER — CIPROFLOXACIN IN D5W 400 MG/200ML IV SOLN
400.0000 mg | Freq: Two times a day (BID) | INTRAVENOUS | Status: DC
  Administered 2011-05-30 (×2): 400 mg via INTRAVENOUS
  Filled 2011-05-30 (×3): qty 200

## 2011-05-30 MED ORDER — LORAZEPAM 0.5 MG PO TABS: 0.5000 mg | ORAL_TABLET | Freq: Two times a day (BID) | ORAL | Status: DC

## 2011-05-30 MED ORDER — FUROSEMIDE 80 MG PO TABS
80.0000 mg | ORAL_TABLET | Freq: Two times a day (BID) | ORAL | Status: DC
  Administered 2011-05-30 – 2011-05-31 (×2): 80 mg via ORAL
  Filled 2011-05-30 (×3): qty 1

## 2011-05-30 NOTE — Progress Notes (Signed)
SNF bed search begun- he is stating he will pay out of pocket for SNF stay (approx 2 weeks). FL2 faxed out and will advise as offfers rec'd. Reece Levy, MSW, Theresia Majors [phone]

## 2011-05-30 NOTE — Progress Notes (Signed)
Physical Therapy Treatment Patient Details Name: Frederick Brown MRN: [id number] DOB: Nov 26, 1950 Today's Date: 05/30/2011  TIME: 4098-1191/47 mins TE-73mins TA  PT Assessment/Plan  PT - Assessment/Plan Comments on Treatment Session: Pt motivated to work with therapy today. Pt able to sit EOB with slight assistance to trunk with HOB elevated 20 degrees. Pt only able to amb 3' due to bilateral foot pain and wanted to sit. Once EOB pt needed to rest before standing PT Goals  Acute Rehab PT Goals PT Goal: Supine/Side to Sit - Progress: Met PT Goal: Sit to Stand - Progress: Met PT Goal: Stand to Sit - Progress: Met PT Goal: Ambulate - Progress: Met  PT Treatment Precautions/Restrictions  Precautions Precautions: Fall Required Braces or Orthoses: No Restrictions Weight Bearing Restrictions: No Mobility (including Balance) Bed Mobility Rolling Left: 6: Modified independent (Device/Increase time) Left Sidelying to Sit: 4: Min assist Left Sidelying to Sit Details (indicate cue type and reason): assistance with trunk: HOB elevated 20 degrees Transfers Transfers: Yes Sit to Stand: 6: Modified independent (Device/Increase time) Stand to Sit: 6: Modified independent (Device/Increase time) Ambulation/Gait Ambulation/Gait: Yes Ambulation/Gait Assistance: 5: Supervision Ambulation Distance (Feet): 3 Feet (both feet in too much pain to go any further) Assistive device:  (bariatric RW) Stairs: No Wheelchair Mobility Wheelchair Mobility: No    Exercise  General Exercises - Upper Extremity Shoulder Flexion: Both;10 reps Shoulder Horizontal ADduction: Both;10 reps General Exercises - Lower Extremity Ankle Circles/Pumps: Both;15 reps Quad Sets: Both;15 reps Gluteal Sets: Both (15 reps) Heel Slides: Both;10 reps (resisted) Hip ABduction/ADduction: Both;10 reps (resisted) End of Session PT - End of Session Equipment Utilized During Treatment: Gait belt Activity Tolerance: Patient  tolerated treatment well;Patient limited by pain (could only amb 3' due to bilateral foot pain) Nurse Communication: Mobility status for transfers General Behavior During Session: [facility] for tasks performed Cognition: [facility] for tasks performed  Samel Bruna ATKINSO 05/30/2011, 12:24 PM

## 2011-05-30 NOTE — Progress Notes (Addendum)
Subjective: Patient had asked for the Foley catheter to be reinserted overnight because of urinary incontinence. He has no other new complaints, other than concern about going home: "In the shape I'm in". It knowledge is that he is breathing much better than he had been before.  Objective: Vital signs in last 24 hours: Filed Vitals:   05/29/11 0551 05/29/11 2255 05/30/11 0511 05/30/11 1027  BP: 129/77 114/65 105/66 109/69  Pulse: 83 90 89   Temp: 97.9 F (36.6 C) 97.9 F (36.6 C) 97.3 F (36.3 C)   TempSrc: Oral Oral Oral   Resp: 20 19 19    Height:      Weight: 251.5 kg (554 lb 7.3 oz)  239 kg (526 lb 14.4 oz)   SpO2: 96% 93% 92%     Intake/Output Summary (Last 24 hours) at 05/30/11 1352 Last data filed at 05/30/11 0511  Gross per 24 hour  Intake    240 ml  Output   6200 ml  Net  -5960 ml    Weight change: -12.5 kg (-27 lb 8.9 oz)  Physical exam: General: No acute distress. Lungs: Globally decreased breath sounds, but clear in the upper lobes. Heart: Distant irregular irregular. Abdomen: Morbidly obese, positive bowel sounds, nontender. Extremities: 1+ bilateral pedal edema. Left foot tenderness over the second and third metatarsal area. Tenderness upon palpation of the left great toe. Patient is able to flex and extend foot without discomfort. Neurologic: He is alert and oriented x2.  Lab Results: Basic Metabolic Panel:  Basename 05/30/11 0637 05/29/11 0651  NA 141 138  K 4.1 4.2  CL 93* 96  CO2 41* 38*  GLUCOSE 85 117*  BUN 28* 27*  CREATININE 0.82 0.74  CALCIUM 10.6* 10.7*  MG -- --  PHOS -- --   Liver Function Tests: No results found for this basename: AST:2,ALT:2,ALKPHOS:2,BILITOT:2,PROT:2,ALBUMIN:2 in the last 72 hours No results found for this basename: LIPASE:2,AMYLASE:2 in the last 72 hours No results found for this basename: AMMONIA:2 in the last 72 hours CBC: No results found for this basename: WBC:2,NEUTROABS:2,HGB:2,HCT:2,MCV:2,PLT:2 in the last  72 hours Cardiac Enzymes: No results found for this basename: CKTOTAL:3,CKMB:3,CKMBINDEX:3,TROPONINI:3 in the last 72 hours BNP:  Basename 05/28/11 0619  PROBNP 1510.0*   D-Dimer: No results found for this basename: DDIMER:2 in the last 72 hours CBG: No results found for this basename: GLUCAP:6 in the last 72 hours Hemoglobin A1C: No results found for this basename: HGBA1C in the last 72 hours Fasting Lipid Panel: No results found for this basename: CHOL,HDL,LDLCALC,TRIG,CHOLHDL,LDLDIRECT in the last 72 hours Thyroid Function Tests: No results found for this basename: TSH,T4TOTAL,FREET4,T3FREE,THYROIDAB in the last 72 hours Anemia Panel: No results found for this basename: VITAMINB12,FOLATE,FERRITIN,TIBC,IRON,RETICCTPCT in the last 72 hours Coagulation: No results found for this basename: LABPROT:2,INR:2 in the last 72 hours Urine Drug Screen: Drugs of Abuse  No results found for this basename: labopia,  cocainscrnur,  labbenz,  amphetmu,  thcu,  labbarb    Alcohol Level: No results found for this basename: ETH:2 in the last 72 hours Urinalysis:  Basename 05/29/11 1938  COLORURINE STRAW*  LABSPEC 1.010  PHURINE 6.5  GLUCOSEU NEGATIVE  HGBUR LARGE*  BILIRUBINUR NEGATIVE  KETONESUR NEGATIVE  PROTEINUR NEGATIVE  UROBILINOGEN 0.2  NITRITE NEGATIVE  LEUKOCYTESUR SMALL*   Misc. Labs:  2-D echocardiogram on 05/23/2011: - Left ventricle: The cavity size was at the upper limits of normal. Wall thickness was increased in a pattern of mild LVH. Systolic function was normal. The estimated ejection  fraction was in the range of 55% to 60%. The study is not technically sufficient to allow evaluation of LV diastolic function. - Mitral valve: Calcified annulus. - Left atrium: The atrium was moderately dilated. - Right ventricle: The cavity size was moderately dilated. Systolic function was mildly reduced. - Right atrium: The atrium was moderately dilated. - Tricuspid valve:  Mild regurgitation. - Pulmonary arteries: PA peak pressure: 38mm Hg (S). - Pericardium, extracardiac: A prominent pericardial fat pad was present. Impressions:  - Upper normal left ventricular chamber size with mild left ventricular hypertrophy, LVEF of approximately 55-60%. Could not fully assess for wall motion abnormalities based on limited acoustic windows. No formal assessment of diastolic function in light of atrial fibrillation. Moderate biatrial enlargement. There is also moderate right ventricular dilatation with mildly decreased function, mild tricuspid regurgitation with PASP 38 mm mercury. Transthoracic echocardiography. M-mode, complete 2D, spectral Doppler, and color Doppler. Height: Height: 177.8cm. Height: 70in. Weight: Weight: 260.4kg. Weight: 572.8lb. Body mass index: BMI: 82.4kg/m^2. Body surface area: BSA: 3.36m^2. Patient status: Inpatient. Location: Bedside.     Micro: No results found for this or any previous visit (from the past 240 hour(s)).  Studies/Results: No results found.  Medications: I have reviewed the patient's current medications.  Assessment: Principal Problem:  *Acute diastolic heart failure Active Problems:  Bronchospasm  Atrial fibrillation  Superficial fungal infection of skin  Hypernatremia  Macrocytosis  Alcohol abuse  Vitamin B12 deficiency  Cor pulmonale, chronic  Fracture of metatarsal of left foot, closed  Fracture of great toe, left, closed  Chronic respiratory failure with hypoxia  Hypercalcemia  UTI (lower urinary tract infection)   1. Acute congestive heart failure secondary to cor pulmonale and diastolic dysfunction. He is diuresing quite rapidly since the increase in Lasix to 120 mg twice a day. Over the past 24 hours, he has diuresed almost 6 L. His BUN and CO2 are increasing which may be indicative of contraction alkalosis/azotemia. His pH is also increasing. Symptomatically he is improved. He has lost more than  27 kg to date.   2. Chronic hypoxia/hypercapnia secondary to obesity hypoventilation syndrome. ABG today noted. He requires home oxygen. This has been ordered.  3. Newly diagnosed atrial fibrillation. His rate is controlled. He is not a Coumadin candidate. He is on aspirin therapy.  4. Mild hypercalcemia. This may be secondary to intravascular volume depletion.  5. Urinary tract infection. Cipro will be started.  6. Fracture or of left great toe and second metatarsal head fracture. A surgical/ hard shoe was ordered. Dr. Romeo Apple evaluated the patient and recommended.  7. Disposition. The patient was informed that the case management staff and the social social work staff have exhausted all of the potential options of him being placed because he is self-pay and has no insurance. The patient was informed that he would be discharged to home today with home health PT and evaluation by a registered nurse. Now, he wants to be discharged to a skilled nursing facility as a self-pay patient. He says that he will try to liquidate funds for paying for placement for at least one week.    Plan:  1. The social worker has been informed of the patient's change in decision making. 2. We'll try to pursue placement if he can afford it financially. 3. Start Cipro. 4. Will decrease the dose of Lasix to 80 mg twice a day. His electrolytes, serum calcium, and renal function will continue to be followed. 5. Prescribed outpatient oxygen.   LOS:  9 days   Tirza Senteno 05/30/2011, 1:52 PM

## 2011-05-30 NOTE — Progress Notes (Signed)
CRITICAL VALUE ALERT  Critical value received:  CO2  Date of notification:  05/30/11  Time of notification:  0730  Critical value read back:yes  Nurse who received alert:  Koleen Nimrod RN  MD notified (1st page):  Dr Sherrie Mustache  Time of first page: 0732  MD notified (2nd page):  Time of second page:  Responding MD: Dr Sherrie Mustache  Time MD responded:  [phone]

## 2011-05-31 ENCOUNTER — Encounter (HOSPITAL_COMMUNITY): Payer: Self-pay | Admitting: Internal Medicine

## 2011-05-31 DIAGNOSIS — L509 Urticaria, unspecified: Secondary | ICD-10-CM

## 2011-05-31 HISTORY — DX: Urticaria, unspecified: L50.9

## 2011-05-31 LAB — BASIC METABOLIC PANEL
BUN: 24 mg/dL — ABNORMAL HIGH (ref 6–23)
Creatinine, Ser: 0.86 mg/dL (ref 0.50–1.35)
GFR calc Af Amer: >90 mL/min (ref >90)
GFR calc non Af Amer: >90 mL/min (ref >90)
Potassium: 3.7 mEq/L (ref 3.5–5.1)

## 2011-05-31 MED ORDER — DIPHENHYDRAMINE HCL 25 MG PO CAPS: 25.0000 mg | ORAL_CAPSULE | Freq: Three times a day (TID) | ORAL | Status: DC

## 2011-05-31 MED ORDER — DEXAMETHASONE SODIUM PHOSPHATE 4 MG/ML IJ SOLN
4.0000 mg | Freq: Once | INTRAMUSCULAR | Status: AC
  Administered 2011-05-31: 4 mg via INTRAVENOUS
  Filled 2011-05-31: qty 1

## 2011-05-31 MED ORDER — FUROSEMIDE 80 MG PO TABS: 80.0000 mg | ORAL_TABLET | Freq: Two times a day (BID) | ORAL | Status: DC

## 2011-05-31 MED ORDER — METOPROLOL TARTRATE 12.5 MG HALF TABLET: 12.5000 mg | ORAL_TABLET | Freq: Two times a day (BID) | ORAL | Status: DC

## 2011-05-31 MED ORDER — OXYCODONE HCL 10 MG PO TABS: 10.0000 mg | ORAL_TABLET | ORAL | Status: AC | PRN

## 2011-05-31 MED ORDER — METHYLPREDNISOLONE SODIUM SUCC 125 MG IJ SOLR
80.0000 mg | Freq: Once | INTRAMUSCULAR | Status: AC
  Administered 2011-05-31: 80 mg via INTRAVENOUS
  Filled 2011-05-31: qty 2

## 2011-05-31 MED ORDER — ADULT MULTIVITAMIN W/MINERALS CH: 1.0000 | ORAL_TABLET | Freq: Every day | ORAL | Status: DC

## 2011-05-31 MED ORDER — DILTIAZEM HCL ER COATED BEADS 240 MG PO CP24: 240.0000 mg | ORAL_CAPSULE | Freq: Every day | ORAL | Status: DC

## 2011-05-31 MED ORDER — NYSTATIN 100000 UNIT/GM EX POWD: 100000.0000 [IU] | Freq: Two times a day (BID) | CUTANEOUS | Status: DC

## 2011-05-31 MED ORDER — CEFUROXIME AXETIL 500 MG PO TABS: 500.0000 mg | ORAL_TABLET | Freq: Two times a day (BID) | ORAL | Status: AC

## 2011-05-31 MED ORDER — LEVALBUTEROL HCL 0.63 MG/3ML IN NEBU: 1.2500 mg | INHALATION_SOLUTION | Freq: Four times a day (QID) | RESPIRATORY_TRACT | Status: DC | PRN

## 2011-05-31 MED ORDER — CYANOCOBALAMIN 1000 MCG/ML IJ SOLN: 1000.0000 ug | INTRAMUSCULAR | Status: AC

## 2011-05-31 MED ORDER — ASPIRIN 81 MG PO TBEC: 81.0000 mg | DELAYED_RELEASE_TABLET | Freq: Every day | ORAL | Status: AC

## 2011-05-31 MED ORDER — LISINOPRIL 5 MG PO TABS: 5.0000 mg | ORAL_TABLET | Freq: Every day | ORAL | Status: DC

## 2011-05-31 MED ORDER — CEFTRIAXONE SODIUM 1 G IJ SOLR
1.0000 g | INTRAMUSCULAR | Status: DC
  Filled 2011-05-31 (×4): qty 10

## 2011-05-31 MED ORDER — DIPHENHYDRAMINE HCL 25 MG PO CAPS
25.0000 mg | ORAL_CAPSULE | Freq: Three times a day (TID) | ORAL | Status: DC | PRN
  Administered 2011-05-31: 25 mg via ORAL
  Filled 2011-05-31: qty 1

## 2011-05-31 MED ORDER — PREDNISONE 10 MG PO TABS: ORAL_TABLET | ORAL | Status: DC

## 2011-05-31 NOTE — Progress Notes (Signed)
CARE MANAGEMENT NOTE 05/31/2011  Patient:  Frederick Brown, Frederick Brown   Account Number:  [id number]  Date Initiated:  05/23/2011  Documentation initiated by:  Rosemary Holms  Subjective/Objective Assessment:   Pt admitted with SOB. PTA lived at home alone.     Action/Plan:   Pt unable to stand on his own due to pain in left foot and weakness. CM advised pt that he was found ineligble for Medicaid therefor not a candidate for placement. CM told pt that when he is found Medically stable and DC'd he will need to   Anticipated DC Date:  05/31/2011   Anticipated DC Plan:  SKILLED NURSING FACILITY  In-house referral  Financial Counselor      DC Planning Services  CM consult      Choice offered to / List presented to:             Status of service:  Completed, signed off Medicare Important Message given?   (If response is "NO", the following Medicare IM given date fields will be blank) Date Medicare IM given:   Date Additional Medicare IM given:    Discharge Disposition:    Per UR Regulation:    Comments:  05/31/11 1500 Cleon Thoma RN BSN CM After much and many conversations about Home with [facility] or SNF, Pt has decided to go to [facility]. CSW to facilitate transfer.  05/27/11/1130 cont from above: make arrangement to return Home.Rosemary Holms RN BSN CM  05/23/11 1200 Danee Soller Leanord Hawking RN BSN CM

## 2011-05-31 NOTE — Progress Notes (Signed)
Patient is continuing to go back and forth about d/c disposition. I have advised  Frederick Brown that [facility] will take Frederick Brown as a private pay and have discussed this with Frederick Brown at length. He states he will decide on this disposition by 3pm today. I expressed to Frederick Brown that he is for d/c today. Reece Levy, MSW, Theresia Majors [phone]

## 2011-05-31 NOTE — Discharge Summary (Signed)
Physician Discharge Summary  Frederick Brown MRN: [id number] DOB/AGE: 1950/09/03 61 y.o.  PCP: [name], MD, MD   Admit date: 05/21/2011 Discharge date: 05/31/2011  Discharge Diagnoses:  1. Dyspnea secondary to cor pulmonale, decompensated diastolic heart failure, right heart failure. Per 2-D echocardiogram on 05/23/2011, the patient's ejection fraction was 55-60%. Peak pulmonary artery pressure was 38 mm of mercury. Right ventricular size was moderately dilated and right ventricular systolic function was mildly reduced. 2. The patient's weight was 268 kg on admission and 232 kg at the time of discharge. 3. Atrial fibrillation, newly diagnosed. The patient is not a Coumadin candidate. 4. Bronchospasms, secondary to mild acute bronchitis. 5. Hypertension. 6. Urinary tract infection. 7. Urticarial rash secondary to Cipro. 8. Fracture of the metatarsal bone of the left foot and fracture of the left great toe. Conservative treatment with hard shoes/surgical shoe and weightbearing as tolerated recommended by orthopedic surgeon Dr. Romeo Apple. 9. Transient hypernatremia. 10. Chronic hypoxia and hypercapnia, secondary to cor pulmonale and obesity hypoventilation syndrome. Now oxygen dependent on 2-1/2 L of oxygen. 11. Macrocytosis secondary to alcohol abuse and vitamin B12 deficiency. 12. Alcohol abuse, now abstinent. 13. Vitamin B 12 deficiency. Treated with 7 days of IM vitamin B 12 injections. 14. Superficial fungal rash of the massive abdominal pannus and perineum. 15. Mild metabolic contraction alkalosis. 16. Hypercalcemia secondary to volume contraction from IV Lasix/diuresis. 17. Massive morbid obesity. 18. Physical deconditioning.   Medication List  As of 05/31/2011  3:33 PM   STOP taking these medications         naproxen sodium 220 MG tablet         TAKE these medications         aspirin 81 MG EC tablet   Take 1 tablet (81 mg total) by mouth daily.     cefUROXime 500 MG tablet   Commonly known as: CEFTIN   Take 1 tablet (500 mg total) by mouth 2 (two) times daily.      cyanocobalamin 1000 MCG/ML injection   Commonly known as: (VITAMIN B-12)   Inject 1 mL (1,000 mcg total) into the muscle every 30 (thirty) days.      diltiazem 240 MG 24 hr capsule   Commonly known as: CARDIZEM CD   Take 1 capsule (240 mg total) by mouth daily.      diphenhydrAMINE 25 mg capsule   Commonly known as: BENADRYL   Take 1 capsule (25 mg total) by mouth 3 (three) times daily. FOR 3 MORE DAYS FOR RASH; THEN AS NEEDED.      furosemide 80 MG tablet   Commonly known as: LASIX   Take 1 tablet (80 mg total) by mouth 2 (two) times daily.      levalbuterol 0.63 MG/3ML nebulizer solution   Commonly known as: XOPENEX   Take 5.9524 mLs (1.25 mg total) by nebulization every 6 (six) hours as needed for wheezing or shortness of breath.      lisinopril 5 MG tablet   Commonly known as: PRINIVIL,ZESTRIL   Take 1 tablet (5 mg total) by mouth daily.      metoprolol tartrate 12.5 mg Tabs   Commonly known as: LOPRESSOR   Take 0.5 tablets (12.5 mg total) by mouth 2 (two) times daily.      mulitivitamin with minerals Tabs   Take 1 tablet by mouth daily.      nystatin 100000 UNIT/GM Powd   Apply 1 g (100,000 Units total) topically 2 (two) times daily.  omeprazole 20 MG capsule   Commonly known as: PRILOSEC   Take 20 mg by mouth daily.      Oxycodone HCl 10 MG Tabs   Take 1 tablet (10 mg total) by mouth every 4 (four) hours as needed.      predniSONE 10 MG tablet   Commonly known as: DELTASONE   Starting tomorrow, take 6 tablets daily for one day; then 5 tablets daily for one day; then 4 tablets the next day; then 3 tablets the next day; then 2 tablets the next day; then 1 tablet next day; then stop.            Discharge Condition: Improved.  Disposition: Skilled nursing facility, [place].   Consults: Nona Dell M.D., [place] Bing M.D.  and Fuller Canada M.D.   Significant Diagnostic Studies: US Venous Img Lower Bilateral  05/24/2011  *RADIOLOGY REPORT*  Clinical Data: Bilateral edema  VENOUS DUPLEX ULTRASOUND OF BILATERAL LOWER EXTREMITIES  Technique:  Gray-scale sonography with graded compression, as well as color Doppler and duplex ultrasound, were performed to evaluate the deep venous system of both lower extremities from the level of the common femoral vein through the popliteal and proximal calf veins.  Spectral Doppler was utilized to evaluate flow at rest and with distal augmentation maneuvers.  Comparison:  None.  Findings: Ultrasound examination bilateral lower extremity demonstrate the visualized deep veins to be patent with good augmentation and compressibility noted.  There is no evidence of deep vein thrombosis.  The exam is suboptimal due to the patient's large body habitus.  IMPRESSION: No evidence of deep vein thrombosis bilateral lower extremity. Suboptimal study as described above.  Original Report Authenticated By: Natasha Mead, M.D.   Dg Chest Port 1 View  05/28/2011  *RADIOLOGY REPORT*  Clinical Data: Congestive heart failure.  PORTABLE CHEST - 1 VIEW  Comparison: Portable chest 05/21/2011.  Findings: Again seen is cardiomegaly.  Pulmonary vascular congestion is present.  Edema is decreased.  No pneumothorax or effusion.  IMPRESSION: Decreased pulmonary edema.  Cardiomegaly and vascular congestion.  Original Report Authenticated By: Bernadene Bell. D'ALESSIO, M.D.   Dg Chest Portable 1 View  05/21/2011  *RADIOLOGY REPORT*  Clinical Data: Shortness of breath  PORTABLE CHEST - 1 VIEW  Comparison: None.  Findings: There is cardiomegaly present with probable pulmonary vascular congestion.  No focal infiltrate is seen and no definite effusion is noted.  No bony abnormality is seen.  IMPRESSION: Cardiomegaly and probable moderate pulmonary vascular congestion. Recommend two-view chest x-ray.  Original Report Authenticated By:  Juline Patch, M.D.   Dg Foot Complete Left  05/27/2011  *RADIOLOGY REPORT*  Clinical Data: Foot pain and swelling for 3 days.  No known injury. Limited lower extremity range of motion.  Obesity.  LEFT FOOT - COMPLETE 3+ VIEW  Comparison: None.  Findings: There is sclerosis and irregularity of the second metatarsal head.  Findings are consistent with a subacute fracture in this region.  There is a comminuted acute fracture at the base of the great toe, proximal phalanx.  Large plantar calcaneal spur is identified.  IMPRESSION:  1.  Subacute fracture of the second metatarsal head. 2.  Acute, comminuted fracture of the base of the proximal phalanx of the great toe.  Original Report Authenticated By: Patterson Hammersmith, M.D.   ECHO: Study Conclusions  - Left ventricle: The cavity size was at the upper limits of normal. Wall thickness was increased in a pattern of mild LVH. Systolic function was normal.  The estimated ejection fraction was in the range of 55% to 60%. The study is not technically sufficient to allow evaluation of LV diastolic function. - Mitral valve: Calcified annulus. - Left atrium: The atrium was moderately dilated. - Right ventricle: The cavity size was moderately dilated. Systolic function was mildly reduced. - Right atrium: The atrium was moderately dilated. - Tricuspid valve: Mild regurgitation. - Pulmonary arteries: PA peak pressure: 38mm Hg (S). - Pericardium, extracardiac: A prominent pericardial fat pad was present. Impressions:  - Upper normal left ventricular chamber size with mild left ventricular hypertrophy, LVEF of approximately 55-60%. Could not fully assess for wall motion abnormalities based on limited acoustic windows. No formal assessment of diastolic function in light of atrial fibrillation. Moderate biatrial enlargement. There is also moderate right ventricular dilatation with mildly decreased function, mild tricuspid regurgitation with PASP 38  mm mercury. Transthoracic echocardiography. M-mode, complete 2D, spectral Doppler, and color Doppler. Height: Height: 177.8cm. Height: 70in. Weight: Weight: 260.4kg. Weight: 572.8lb. Body mass index: BMI: 82.4kg/m^2. Body surface area: BSA: 3.69m^2. Patient status: Inpatient. Location: Bedside.    Microbiology: No results found for this or any previous visit (from the past 240 hour(s)).   Labs: Results for orders placed during the hospital encounter of 05/21/11 (from the past 48 hour(s))  URINALYSIS, ROUTINE W REFLEX MICROSCOPIC     Status: Abnormal   Collection Time   05/29/11  7:38 PM      Component Value Range Comment   Color, Urine STRAW (*) YELLOW     APPearance HAZY (*) CLEAR     Specific Gravity, Urine 1.010  1.005 - 1.030     pH 6.5  5.0 - 8.0     Glucose, UA NEGATIVE  NEGATIVE (mg/dL)    Hgb urine dipstick LARGE (*) NEGATIVE     Bilirubin Urine NEGATIVE  NEGATIVE     Ketones, ur NEGATIVE  NEGATIVE (mg/dL)    Protein, ur NEGATIVE  NEGATIVE (mg/dL)    Urobilinogen, UA 0.2  0.0 - 1.0 (mg/dL)    Nitrite NEGATIVE  NEGATIVE     Leukocytes, UA SMALL (*) NEGATIVE    URINE MICROSCOPIC-ADD ON     Status: Abnormal   Collection Time   05/29/11  7:38 PM      Component Value Range Comment   Squamous Epithelial / LPF RARE  RARE     WBC, UA TOO NUMEROUS TO COUNT  <3 (WBC/hpf)    RBC / HPF TOO NUMEROUS TO COUNT  <3 (RBC/hpf)    Bacteria, UA FEW (*) RARE     Casts HYALINE CASTS (*) NEGATIVE    BASIC METABOLIC PANEL     Status: Abnormal   Collection Time   05/30/11  6:37 AM      Component Value Range Comment   Sodium 141  135 - 145 (mEq/L)    Potassium 4.1  3.5 - 5.1 (mEq/L)    Chloride 93 (*) 96 - 112 (mEq/L)    CO2 41 (*) 19 - 32 (mEq/L)    Glucose, Bld 85  70 - 99 (mg/dL)    BUN 28 (*) 6 - 23 (mg/dL)    Creatinine, Ser 1.61  0.50 - 1.35 (mg/dL)    Calcium 09.6 (*) 8.4 - 10.5 (mg/dL)    GFR calc non Af Amer >90  >90 (mL/min)    GFR calc Af Amer >90  >90 (mL/min)   BLOOD  GAS, ARTERIAL     Status: Abnormal   Collection Time   05/30/11  8:30 AM      Component Value Range Comment   FIO2 21.00      pH, Arterial 7.457 (*) 7.350 - 7.450     pCO2 arterial 59.0 (*) 35.0 - 45.0 (mmHg)    pO2, Arterial 47.5 (*) 80.0 - 100.0 (mmHg)    Bicarbonate 41.1 (*) 20.0 - 24.0 (mEq/L)    TCO2 35.2  0 - 100 (mmol/L)    Acid-Base Excess 16.1 (*) 0.0 - 2.0 (mmol/L)    O2 Saturation 85.7      Patient temperature 37.0      Collection site LEFT RADIAL      Drawn by COLLECTED BY RT      Sample type ARTERIAL      Allens test (pass/fail) PASS  PASS    BASIC METABOLIC PANEL     Status: Abnormal   Collection Time   05/31/11  6:04 AM      Component Value Range Comment   Sodium 139  135 - 145 (mEq/L)    Potassium 3.7  3.5 - 5.1 (mEq/L)    Chloride 95 (*) 96 - 112 (mEq/L)    CO2 39 (*) 19 - 32 (mEq/L)    Glucose, Bld 107 (*) 70 - 99 (mg/dL)    BUN 24 (*) 6 - 23 (mg/dL)    Creatinine, Ser 2.13  0.50 - 1.35 (mg/dL)    Calcium 08.6  8.4 - 10.5 (mg/dL)    GFR calc non Af Amer >90  >90 (mL/min)    GFR calc Af Amer >90  >90 (mL/min)      HPI : The patient is a 61 year old man with a past medical history significant for morbid obesity, hypertension, degenerative joint disease, and gastroesophageal reflux disease, who presented to the emergency department on 05/21/2011 with a chief complaint of shortness of breath. In the emergency department, he was noted to be afebrile and hemodynamically stable. His blood pressure was ranging from 116/75-165/101. He was oxygenating 92% on nasal cannula oxygen. His lab data were significant for a serum sodium of 146, pro BNP of 1490, and MCV of 107.7. His EKG revealed atrial fibrillation with a heart rate of 97 beats per minute and low voltage QRS. His chest x-ray revealed cardiomegaly and moderate pulmonary vascular congestion. He was admitted for further evaluation and management.for additional details please see the dictated history and  physical.  HOSPITAL COURSE: The patient was started on treatment with IV Lasix for newly diagnosed congestive heart failure. Aspirin therapy was started with newly diagnosed atrial fibrillation. Cardizem was also started as the patient's heart rate began to increase episodically. Prophylactic Lovenox was initiated as given the patient's history of alcohol abuse and morbid obesity, it was believed that he would not be a candidate for chronic anticoagulation. Vitamin therapy and as needed Ativan were started given the history of alcohol abuse. However, the patient stated that he had not drunk any alcohol in 5 days leading up to the hospitalization. The patient was nearly 600 pounds on admission and admittedly, he was unable to take care of himself at home according to the patient himself and his family. Nystatin powder was applied twice daily to his perineum and buttocks for a superficial fungal infection of his pannus and perineum. Xopenex was given for bronchospasms consistent with mild acute bronchitis or cardiogenic asthma. For further evaluation and management, a number of studies were ordered. All of his cardiac enzymes were within normal limits and therefore he ruled out for myocardial infarction. His hemoglobin  A1c was 5.6. His TSH was within normal limits at 1.3. His 2-D echocardiogram revealed preserved LV function but with elevated pulmonary pressure and reduced right ventricular systolic pressure and a dilated right heart. All of these findings were consistent with right heart failure/cor pulmonale and possibly diastolic failure.  Cardiology was consulted. Evaluation and management recommendations were provided by both Dr. Diona Browner and Dr. Dietrich Pates. Over the course of the hospitalization, medications were adjusted to include increasing the dose of Lasix, changing Cardizem to once daily dosing, adding ACE inhibitor therapy, and adding beta blockade therapy. It was agreed that the patient was not a  Coumadin candidate.  Bilateral lower extremity venous ultrasound was ordered to rule out DVT. It was negative for DVT. The patient complained of left foot pain. Her uric acid level is ordered and it was found to be elevated at greater than 9. Therefore, he was started empirically on indomethacin and prednisone for what was believed to be a gout exacerbation. However, an x-ray of his left foot was ordered and it revealed subacute fracture of the second metatarsal head and phalanx of the great toe. Orthopedic surgeon Dr. Romeo Apple was consulted. He recommended applying a heart surgical shoe and weightbearing as tolerated. His pain was treated with as needed oxycodone.  The patient complained of pain with urination and urinary incontinence. Her urinalysis was ordered and it revealed too numerous to count WBCs, too numerous to count RBCs, and few bacteria. He was therefore started on Cipro for urinary tract infection. After the second dose of Cipro, he developed an urticarial rash on his arms. There was no airway compromise. There was no oropharyngeal edema. The rash appeared on no other skin surface. He was started on treatment with Benadryl and steroids. Antibiotic therapy was changed to Rocephin.  During the hospitalization, the patient had a few electrolyte abnormalities including metabolic alkalosis, hypernatremia, and hypercalcemia, all which were thought to be related to diuresing. His renal function however remain the same. It was suspected that he had obstructive sleep apnea and chronic obesity hypoventilation syndrome. An ABG was ordered on 2 occasions. As of yesterday, his pH was 7.45, PCO2 59, and PO2 of 47.5 on room air. The patient qualifies for home oxygen and this was ordered. He would benefit from an outpatient sleep study.   Over the course of the hospitalization, the patient diuresed briskly and effectively. He lost approximately 80 pounds of fluid weight. He remained hemodynamically stable.  There were no signs consistent with alcohol withdrawal syndrome. He was counseled on stopping drinking. The physical therapist recommended skilled nursing facility placement for further strengthening and rehabilitation. The patient did not and does not have health insurance and therefore he was informed that he would have to self-pay to be transferred. After a couple of days of going back-and-forth, the patient apparently found resources to pay for skilled nursing facility placement short-term for rehabilitation. Therefore, he is being discharged today to [facility]. He is in improved and stable condition. He will need to followup with Dr. Dietrich Pates nurse practitioner, Ms. Lawrence as scheduled.   Discharge Exam: Blood pressure 104/68, pulse 96, temperature 98.4 F (36.9 C), temperature source Oral, resp. rate 20, height 5\' 10"  (1.778 m), weight 232.2 kg (511 lb 14.5 oz), SpO2 91.00%.  HEENT: Oropharyngeal reveals no posterior air edema or edema or swelling. Lungs: Clear to auscultation bilaterally. Heart: Irregular, irregular. Abdomen: Morbidly morbidly obese abdomen with excessively large pannus, positive bowel sounds, nontender, nondistended. Extremities: 1+ bilateral lower extremity edema.  Tenderness over the left foot particularly over the second metatarsal area. Neurologic: He is alert and oriented x3.    Discharge Orders    Future Appointments: Provider: Department: Dept Phone: Center:   06/13/2011 1:00 PM Joni Reining, NP [facility] [phone] [id number]     Future Orders Please Complete By Expires   Diet - low sodium heart healthy      Increase activity slowly         Follow-up Information    Follow up with Joni Reining, NP on 06/13/2011. (1 pm.  office)    Contact information:   [address] [place] [address] [phone]          Total discharge time: 50  minutes.   Signed: Eden Toohey 05/31/2011, 3:33 PM

## 2011-05-31 NOTE — Progress Notes (Signed)
Patient developed a rash on the inner aspects of bilateral arms after receiving his second dose of Cipro.  He cannot remember if has ever taken it before.  His cheeks were slightly red, but he did not have any SOB of feelings of throat swelling.  I spoke with Dr. Venetia Constable and he was given the Benadryl and dexamethasone, which has helped his itching, but the rash is still evident.  He will be watched closely for any signs or symptoms of throat swelling or other manifestations of reaction and this will be passed on to the day nurse as well.    He is complaining of severe burning with urination and the urine is extremely malodorous.  He does have a UTI and was encouraged to increase his intake of water, although he has been insisting on Coke instead.  I did take him a cup of water with his Benadryl and encouraged him to drink it.  The patient is quite excoriated around his genital region as well as his abdominal folds.  At times, he will ask for the urinal, but a lot of the time he urinates on himself, then does not want to have the bed dry as he does not want to go through having to move, etc.  I explained to him in detail the fact that urine left on the skin will only add to the current excoriation and could lead to further breakdown.

## 2011-05-31 NOTE — Progress Notes (Signed)
Physical Therapy Treatment Patient Details Name: Frederick Brown MRN: [id number] DOB: Aug 18, 1950 Today's Date: 05/31/2011  TIME: 1313-1329/ TA  PT Assessment/Plan  PT - Assessment/Plan Comments on Treatment Session: Bed mobility was Min A today with HOB elevated 70 degrees. Pt uses momentum to sit up but needs assistance with last bit and to reach foot board for support. Pt was able to stand MI and remain standing for while nursing cleaned him. Patient's feet became painful at this point and was able to amb 3' to chair to sit. PT Goals     PT Treatment Precautions/Restrictions  Precautions Precautions: Fall Required Braces or Orthoses: No Restrictions Weight Bearing Restrictions: No Mobility (including Balance) Bed Mobility Bed Mobility: Yes Supine to Sit: 4: Min assist Supine to Sit Details (indicate cue type and reason): slight assistance needed with trunk and getting UE to rail for support Transfers Transfers: Yes Sit to Stand: 6: Modified independent (Device/Increase time) Stand to Sit: 5: Supervision Stand to Sit Details: verbal cues to back up to surface before sitting Ambulation/Gait Ambulation/Gait: Yes Ambulation/Gait Assistance: 6: Modified independent (Device/Increase time) Ambulation Distance (Feet): 3 Feet (patient had been standing while nursing [place] pt)    Exercise  Other Exercises Other Exercises: static standing with bariatric RW for  while nursing cleaned pt End of Session PT - End of Session Equipment Utilized During Treatment: Gait belt Activity Tolerance: Treatment limited secondary to medical complications (Comment) (painful feet;unable to stand or amb any longer) Patient left: in chair;with call bell in reach Nurse Communication: Mobility status for ambulation;Mobility status for transfers General Behavior During Session: [facility] for tasks performed Cognition: [facility] for tasks performed  Renesmay Nesbitt ATKINSO 05/31/2011, 2:40  PM

## 2011-05-31 NOTE — Progress Notes (Signed)
Frederick Brown developed an allergic reaction to cipro, with a generalised rash, noticed after bag hung up, but he did not develop respiratory distress. The cipro will be discontinued, and he will be placed on ceftriaxone instead for the uti. He will also receive dexamethasone and benadryl. Karon Cotterill,MD pager#[phone].

## 2011-05-31 NOTE — Progress Notes (Signed)
PIV removed without complaint, patient discharged to Skilled Nursing Facility. Report called to Haskell Riling, RN. Patient transported by EMS.

## 2011-05-31 NOTE — Progress Notes (Signed)
  Frederick Brown has accepted placement at [facility] (snf) and will transfer via ambulance. We forwarded the discharge summary and avs med list to the facility.   Delphia Grates, Marine scientist, Clinical Social Work 05/31/2011

## 2011-06-11 ENCOUNTER — Encounter: Payer: Self-pay | Admitting: Adult Health

## 2011-06-13 ENCOUNTER — Encounter: Payer: Self-pay | Admitting: Adult Health

## 2013-06-22 ENCOUNTER — Encounter (HOSPITAL_COMMUNITY): Payer: Self-pay | Admitting: Emergency Medicine

## 2013-06-22 ENCOUNTER — Emergency Department (HOSPITAL_COMMUNITY): Payer: Medicare Other

## 2013-06-22 ENCOUNTER — Inpatient Hospital Stay (HOSPITAL_COMMUNITY)
Admission: EM | Admit: 2013-06-22 | Discharge: 2013-06-28 | DRG: 683 | Disposition: A | Discharge status: Home Health | Payer: Medicare Other | Attending: Internal Medicine | Admitting: Internal Medicine

## 2013-06-22 DIAGNOSIS — J9611 Chronic respiratory failure with hypoxia: Secondary | ICD-10-CM

## 2013-06-22 DIAGNOSIS — B369 Superficial mycosis, unspecified: Secondary | ICD-10-CM

## 2013-06-22 DIAGNOSIS — E875 Hyperkalemia: Secondary | ICD-10-CM

## 2013-06-22 DIAGNOSIS — S92402A Displaced unspecified fracture of left great toe, initial encounter for closed fracture: Secondary | ICD-10-CM

## 2013-06-22 DIAGNOSIS — T3995XA Adverse effect of unspecified nonopioid analgesic, antipyretic and antirheumatic, initial encounter: Secondary | ICD-10-CM | POA: Diagnosis present

## 2013-06-22 DIAGNOSIS — I4891 Unspecified atrial fibrillation: Secondary | ICD-10-CM | POA: Diagnosis present

## 2013-06-22 DIAGNOSIS — I959 Hypotension, unspecified: Secondary | ICD-10-CM | POA: Diagnosis present

## 2013-06-22 DIAGNOSIS — I509 Heart failure, unspecified: Secondary | ICD-10-CM | POA: Diagnosis present

## 2013-06-22 DIAGNOSIS — M1990 Unspecified osteoarthritis, unspecified site: Secondary | ICD-10-CM | POA: Diagnosis present

## 2013-06-22 DIAGNOSIS — K219 Gastro-esophageal reflux disease without esophagitis: Secondary | ICD-10-CM | POA: Diagnosis present

## 2013-06-22 DIAGNOSIS — E538 Deficiency of other specified B group vitamins: Secondary | ICD-10-CM

## 2013-06-22 DIAGNOSIS — I2781 Cor pulmonale (chronic): Secondary | ICD-10-CM

## 2013-06-22 DIAGNOSIS — M109 Gout, unspecified: Secondary | ICD-10-CM

## 2013-06-22 DIAGNOSIS — N189 Chronic kidney disease, unspecified: Secondary | ICD-10-CM | POA: Diagnosis present

## 2013-06-22 DIAGNOSIS — S92302A Fracture of unspecified metatarsal bone(s), left foot, initial encounter for closed fracture: Secondary | ICD-10-CM

## 2013-06-22 DIAGNOSIS — L509 Urticaria, unspecified: Secondary | ICD-10-CM

## 2013-06-22 DIAGNOSIS — D7589 Other specified diseases of blood and blood-forming organs: Secondary | ICD-10-CM

## 2013-06-22 DIAGNOSIS — E870 Hyperosmolality and hypernatremia: Secondary | ICD-10-CM

## 2013-06-22 DIAGNOSIS — Z823 Family history of stroke: Secondary | ICD-10-CM

## 2013-06-22 DIAGNOSIS — I5032 Chronic diastolic (congestive) heart failure: Secondary | ICD-10-CM | POA: Diagnosis present

## 2013-06-22 DIAGNOSIS — I5189 Other ill-defined heart diseases: Secondary | ICD-10-CM

## 2013-06-22 DIAGNOSIS — I5031 Acute diastolic (congestive) heart failure: Secondary | ICD-10-CM

## 2013-06-22 DIAGNOSIS — F1010 Alcohol abuse, uncomplicated: Secondary | ICD-10-CM

## 2013-06-22 DIAGNOSIS — N179 Acute kidney failure, unspecified: Principal | ICD-10-CM | POA: Diagnosis present

## 2013-06-22 DIAGNOSIS — I519 Heart disease, unspecified: Secondary | ICD-10-CM

## 2013-06-22 DIAGNOSIS — Z6841 Body Mass Index (BMI) 40.0 and over, adult: Secondary | ICD-10-CM

## 2013-06-22 DIAGNOSIS — J9801 Acute bronchospasm: Secondary | ICD-10-CM

## 2013-06-22 DIAGNOSIS — N390 Urinary tract infection, site not specified: Secondary | ICD-10-CM | POA: Diagnosis present

## 2013-06-22 DIAGNOSIS — J9610 Chronic respiratory failure, unspecified whether with hypoxia or hypercapnia: Secondary | ICD-10-CM | POA: Diagnosis present

## 2013-06-22 DIAGNOSIS — T502X5A Adverse effect of carbonic-anhydrase inhibitors, benzothiadiazides and other diuretics, initial encounter: Secondary | ICD-10-CM | POA: Diagnosis present

## 2013-06-22 DIAGNOSIS — Z8249 Family history of ischemic heart disease and other diseases of the circulatory system: Secondary | ICD-10-CM

## 2013-06-22 DIAGNOSIS — I129 Hypertensive chronic kidney disease with stage 1 through stage 4 chronic kidney disease, or unspecified chronic kidney disease: Secondary | ICD-10-CM | POA: Diagnosis present

## 2013-06-22 DIAGNOSIS — E860 Dehydration: Secondary | ICD-10-CM | POA: Diagnosis present

## 2013-06-22 HISTORY — DX: Heart failure, unspecified: I50.9

## 2013-06-22 LAB — CBC WITH DIFFERENTIAL/PLATELET
Basophils Absolute: 0.1 10*3/uL (ref 0.0–0.1)
Basophils Relative: 1 % (ref 0–1)
Eosinophils Absolute: 0.1 10*3/uL (ref 0.0–0.7)
Eosinophils Relative: 1 % (ref 0–5)
HCT: 39.5 % (ref 39.0–52.0)
Hemoglobin: 12.7 g/dL — ABNORMAL LOW (ref 13.0–17.0)
Lymphocytes Relative: 17 % (ref 12–46)
Lymphs Abs: 1.3 10*3/uL (ref 0.7–4.0)
MCH: 30.5 pg (ref 26.0–34.0)
MCHC: 32.2 g/dL (ref 30.0–36.0)
MCV: 94.7 fL (ref 78.0–100.0)
Monocytes Absolute: 0.4 10*3/uL (ref 0.1–1.0)
Monocytes Relative: 6 % (ref 3–12)
Neutro Abs: 5.6 10*3/uL (ref 1.7–7.7)
Neutrophils Relative %: 75 % (ref 43–77)
Platelets: 314 10*3/uL (ref 150–400)
RBC: 4.17 MIL/uL — ABNORMAL LOW (ref 4.22–5.81)
RDW: 13.9 % (ref 11.5–15.5)
WBC: 7.5 10*3/uL (ref 4.0–10.5)

## 2013-06-22 LAB — BASIC METABOLIC PANEL
BUN: 73 mg/dL — ABNORMAL HIGH (ref 6–23)
CO2: 23 mEq/L (ref 19–32)
Calcium: 11 mg/dL — ABNORMAL HIGH (ref 8.4–10.5)
Chloride: 104 mEq/L (ref 96–112)
Creatinine, Ser: 1.96 mg/dL — ABNORMAL HIGH (ref 0.50–1.35)
GFR calc Af Amer: 41 mL/min — ABNORMAL LOW (ref >90)
GFR calc non Af Amer: 35 mL/min — ABNORMAL LOW (ref >90)
Glucose, Bld: 117 mg/dL — ABNORMAL HIGH (ref 70–99)
Potassium: 5.7 mEq/L — ABNORMAL HIGH (ref 3.7–5.3)
Sodium: 142 mEq/L (ref 137–147)

## 2013-06-22 LAB — TROPONIN I: Troponin I: <0.3 ng/mL (ref <0.30)

## 2013-06-22 LAB — MAGNESIUM: Magnesium: 2.4 mg/dL (ref 1.5–2.5)

## 2013-06-22 MED ORDER — SODIUM CHLORIDE 0.9 % IV BOLUS (SEPSIS)
1000.0000 mL | Freq: Once | INTRAVENOUS | Status: AC
  Administered 2013-06-22: 1000 mL via INTRAVENOUS

## 2013-06-22 MED ORDER — SODIUM CHLORIDE 0.9 % IV SOLN: INTRAVENOUS | Status: DC

## 2013-06-22 MED ORDER — SODIUM POLYSTYRENE SULFONATE 15 GM/60ML PO SUSP
30.0000 g | Freq: Once | ORAL | Status: AC
  Administered 2013-06-23: 30 g via ORAL
  Filled 2013-06-22: qty 120

## 2013-06-22 NOTE — ED Provider Notes (Cosign Needed)
CSN: [id number]     Arrival date & time 06/22/13  1729 History   First MD Initiated Contact with Patient 06/22/13 1824     Chief Complaint  Patient presents with  . Shortness of Breath     (Consider location/radiation/quality/duration/timing/severity/associated sxs/prior Treatment) HPI  63 y.o. male with a history of morbid obesity and HTN on Lisinopril and Lasix , questionable DHF with recent admit to [facility] from 1/22 - 2/4.  Initially presented then from plastic surgery clinic for evaluation for panniculectomy. He was noted to be hypotensive with systolic blood pressure in the 70s and was transferred to the ED. Found to have AKI and admitted. Improved to Cr of 1.28 on discharge from 3.29 on admission. Restarted on lasix, ACEI/HCTZ on discharge. Pt reports hasn't taken lasix in past 2-3 days though. Has been compliant with other meds though aside from ASA.  Chronic afib on metoprolol. Risk factors of HTN and DHF. Pt apparently declined further anticoagulation beyond aspirin. He then subsequently decided he didn't want to continue taking it after discharge though.  Went to rehab center for several weeks after DC and then back home. Home health nurse today apparently noted O2 sats in 60%s, although pt with no acute respiratory complaints.  Currently complaining of cold hands and tingling in his feet. Denies CP. No fever or chills. No n/v. Reports urine outpt has been good. No appetite for the past couple days, but denies nausea or abdominal pain.    Brother confiding to me that suspecting pt continues to drink. Has hx of etoh abuse, but pt denies that has had a drink since left hospital.    Past Medical History  Diagnosis Date  . Hypertension   . Obesity, morbid (more than 100 lbs over ideal weight or BMI > 40)   . Degenerative joint disease   . Alcohol abuse   . Umbilical hernia   . Gastroesophageal reflux disease   . Vitamin B12 deficiency 05/22/2011  . Fracture of metatarsal of  left foot, closed 05/28/2011  . Fracture of great toe, left, closed 05/28/2011  . Chronic respiratory failure with hypoxia 05/28/2011  . Urticarial rash 05/31/2011    From Cipro  . Cor pulmonale, chronic February 2013  . Obesity hypoventilation syndrome February 2013  . Pickwickian syndrome   . CHF (congestive heart failure)    Past Surgical History  Procedure Laterality Date  . Cholecystectomy    . Knee surgery     Family History  Problem Relation Age of Onset  . Heart attack Father   . Heart failure Father   . Stroke Father   . Hypertension Father    History  Substance Use Topics  . Smoking status: Never Smoker   . Smokeless tobacco: Not on file  . Alcohol Use: No     Comment: quit 2 months ago    Review of Systems  All systems reviewed and negative, other than as noted in HPI.   Allergies  Ciprofloxacin  Home Medications   Current Outpatient Rx  Name  Route  Sig  Dispense  Refill  . lisinopril-hydrochlorothiazide (PRINZIDE,ZESTORETIC) 20-12.5 MG per tablet   Oral   Take 1 tablet by mouth daily.         . metoprolol tartrate (LOPRESSOR) 25 MG tablet   Oral   Take 25 mg by mouth 2 (two) times daily.         . Multiple Vitamin (MULITIVITAMIN WITH MINERALS) TABS   Oral   Take 1  tablet by mouth daily.         . naproxen (NAPROSYN) 500 MG tablet   Oral   Take 500 mg by mouth 2 (two) times daily with a meal.         . omeprazole (PRILOSEC) 20 MG capsule   Oral   Take 20 mg by mouth daily.         Marland Kitchen oxyCODONE-acetaminophen (PERCOCET) 10-325 MG per tablet   Oral   Take 1 tablet by mouth daily as needed. For pain. Normally taken at bedtime         . simvastatin (ZOCOR) 10 MG tablet   Oral   Take 10 mg by mouth daily.          BP 106/53  Pulse 85  Temp(Src) 98.2 F (36.8 C) (Oral)  Resp 23  Ht 5\' 10"  (1.778 m)  Wt 394 lb (178.717 kg)  BMI 56.53 kg/m2  SpO2 100% Physical Exam  Nursing note and vitals reviewed. Constitutional: He  appears well-developed and well-nourished. No distress.  Sitting in chair. NAD. Morbidly obese.   HENT:  Head: Normocephalic and atraumatic.  Eyes: Conjunctivae are normal. Right eye exhibits no discharge. Left eye exhibits no discharge.  Neck: Neck supple.  Cardiovascular: Normal rate and normal heart sounds.  Exam reveals no gallop and no friction rub.   No murmur heard. irreg irreg  Pulmonary/Chest: Effort normal and breath sounds normal. No respiratory distress.  Abdominal: Soft. He exhibits no distension. There is no tenderness.  Musculoskeletal: He exhibits no edema and no tenderness.  Neurological: He is alert.  Skin: Skin is warm and dry. He is not diaphoretic.  Psychiatric: He has a normal mood and affect. His behavior is normal. Thought content normal.    ED Course  Procedures (including critical care time)  CRITICAL CARE Performed by: Raeford Razor  Total critical care time: 35 minutes  Critical care time was exclusive of separately billable procedures and treating other patients. Critical care was necessary to treat or prevent imminent or life-threatening deterioration. Critical care was time spent personally by me on the following activities: development of treatment plan with patient and/or surrogate as well as nursing, discussions with consultants, evaluation of patient's response to treatment, examination of patient, obtaining history from patient or surrogate, ordering and performing treatments and interventions, ordering and review of laboratory studies, ordering and review of radiographic studies, pulse oximetry and re-evaluation of patient's condition.  Labs Review Labs Reviewed  CBC WITH DIFFERENTIAL - Abnormal; Notable for the following:    RBC 4.17 (*)    Hemoglobin 12.7 (*)    All other components within normal limits  BASIC METABOLIC PANEL - Abnormal; Notable for the following:    Potassium 5.7 (*)    Glucose, Bld 117 (*)    BUN 73 (*)    Creatinine,  Ser 1.96 (*)    Calcium 11.0 (*)    GFR calc non Af Amer 35 (*)    GFR calc Af Amer 41 (*)    All other components within normal limits  TROPONIN I  MAGNESIUM  URINALYSIS, ROUTINE W REFLEX MICROSCOPIC   Imaging Review Dg Chest 2 View  06/22/2013   CLINICAL DATA:  Shortness of breath  EXAM: CHEST  2 VIEW  COMPARISON:  Prior radiograph from 05/28/2011  FINDINGS: Cardiomegaly is stable as compared to prior study. Mediastinal silhouette within normal limits.  The lungs are normally inflated. No airspace consolidation, pleural effusion, or pulmonary edema is identified. There is  no pneumothorax.  No acute osseous abnormality identified. Multilevel degenerative changes noted within the visualized spine.  IMPRESSION: Stable cardiomegaly. No acute cardiopulmonary abnormality identified.   Electronically Signed   By: Rise Mu M.D.   On: 06/22/2013 19:48   EKG:  Rhythm: atrial fibrillation Rate: 71 QRS: 94 ms QTc: 393 ms ST segments: NS ST changes    EKG Interpretation None      MDM   Final diagnoses:  AKI (acute kidney injury)  Severe dehydration  Hypotension  Atrial fibrillation    63 year old male with acute kidney injury. Likely at least some component of prerenal given degree of elevation of his BUN and symptoms of anorexia as well as diuretic usage. Fluid resuscitation.  He is in atrial fibrillation. He has a history of this. He is currently rate controlled on metoprolol. He is currently not anticoagulated at his own choice, but with talking to him about this, he doesn't seem to have a clear understanding of the rationale for anticoagulation.   In terms of his reported hypoxemia prior to arrival, I suspect that this was erroneous.Perhaps poor perfusion of digits with hypotension or difficulty with reading from afib.  Patient with little in terms of respiratory symptoms at this time. He has been with normal oxygen saturations emergency room on room air. CXR clear. Trop  normal.   Pt is hypotensive, but nontoxic. Suspect from volume depletion. Responding to IVF. Clinically not sepsis, cardiogenic shock, etc. Will discuss with medicine for admission.     Raeford Razor, MD 06/22/13 [phone]

## 2013-06-22 NOTE — ED Notes (Signed)
Patient states he does not want to get into our bed because "they are not a bariatric bed." States he has to lie down in bed to urinate but refuses to get into our beds. Advised AC that patient is requesting a bariatric bed.

## 2013-06-22 NOTE — ED Notes (Signed)
Sent here by home health nurse - reports O2 was 62% at home, pt reports sob with exertion.  C/o bil chronic hip pain, denies cp.

## 2013-06-22 NOTE — ED Notes (Signed)
edp in with pt 

## 2013-06-22 NOTE — ED Notes (Signed)
Patient states home health nurse sent him to ED due to low O2 sats. States O2 saturation was in the 60's at home. Patient is not on oxygen and O2 saturation is 100% on room air. Patient is alert and talking at this time. States he wants to sit in the wheelchair and does not want to get in the bed. No obvious distress noted at this time.

## 2013-06-23 DIAGNOSIS — I5189 Other ill-defined heart diseases: Secondary | ICD-10-CM | POA: Diagnosis present

## 2013-06-23 DIAGNOSIS — J9610 Chronic respiratory failure, unspecified whether with hypoxia or hypercapnia: Secondary | ICD-10-CM

## 2013-06-23 DIAGNOSIS — I279 Pulmonary heart disease, unspecified: Secondary | ICD-10-CM

## 2013-06-23 DIAGNOSIS — R0902 Hypoxemia: Secondary | ICD-10-CM

## 2013-06-23 DIAGNOSIS — E875 Hyperkalemia: Secondary | ICD-10-CM | POA: Diagnosis present

## 2013-06-23 DIAGNOSIS — M109 Gout, unspecified: Secondary | ICD-10-CM | POA: Diagnosis present

## 2013-06-23 DIAGNOSIS — E860 Dehydration: Secondary | ICD-10-CM | POA: Diagnosis present

## 2013-06-23 DIAGNOSIS — I959 Hypotension, unspecified: Secondary | ICD-10-CM | POA: Diagnosis present

## 2013-06-23 DIAGNOSIS — N390 Urinary tract infection, site not specified: Secondary | ICD-10-CM | POA: Diagnosis present

## 2013-06-23 LAB — CBC
HEMATOCRIT: 34.5 % — AB (ref 39.0–52.0)
HEMOGLOBIN: 11.2 g/dL — AB (ref 13.0–17.0)
MCH: 31.1 pg (ref 26.0–34.0)
MCHC: 32.5 g/dL (ref 30.0–36.0)
MCV: 95.8 fL (ref 78.0–100.0)
Platelets: 215 10*3/uL (ref 150–400)
RBC: 3.6 MIL/uL — ABNORMAL LOW (ref 4.22–5.81)
RDW: 14 % (ref 11.5–15.5)
WBC: 6.8 10*3/uL (ref 4.0–10.5)

## 2013-06-23 LAB — URINE MICROSCOPIC-ADD ON

## 2013-06-23 LAB — URINALYSIS, ROUTINE W REFLEX MICROSCOPIC
Bilirubin Urine: NEGATIVE
Glucose, UA: NEGATIVE mg/dL
Hgb urine dipstick: NEGATIVE
Ketones, ur: NEGATIVE mg/dL
Nitrite: POSITIVE — AB
Protein, ur: NEGATIVE mg/dL
Specific Gravity, Urine: 1.02 (ref 1.005–1.030)
Urobilinogen, UA: 0.2 mg/dL (ref 0.0–1.0)
pH: 6 (ref 5.0–8.0)

## 2013-06-23 LAB — MRSA PCR SCREENING: MRSA by PCR: NEGATIVE

## 2013-06-23 LAB — BASIC METABOLIC PANEL
BUN: 63 mg/dL — AB (ref 6–23)
CHLORIDE: 111 meq/L (ref 96–112)
CO2: 22 mEq/L (ref 19–32)
Calcium: 9.9 mg/dL (ref 8.4–10.5)
Creatinine, Ser: 1.74 mg/dL — ABNORMAL HIGH (ref 0.50–1.35)
GFR calc Af Amer: 47 mL/min — ABNORMAL LOW (ref >90)
GFR calc non Af Amer: 41 mL/min — ABNORMAL LOW (ref >90)
GLUCOSE: 90 mg/dL (ref 70–99)
Potassium: 4.7 mEq/L (ref 3.7–5.3)
Sodium: 145 mEq/L (ref 137–147)

## 2013-06-23 LAB — URIC ACID: Uric Acid, Serum: 11.3 mg/dL — ABNORMAL HIGH (ref 4.0–7.8)

## 2013-06-23 MED ORDER — ONDANSETRON HCL 4 MG PO TABS: 4.0000 mg | ORAL_TABLET | Freq: Four times a day (QID) | ORAL | Status: DC | PRN

## 2013-06-23 MED ORDER — SIMVASTATIN 10 MG PO TABS
10.0000 mg | ORAL_TABLET | Freq: Every day | ORAL | Status: DC
  Administered 2013-06-23 – 2013-06-28 (×6): 10 mg via ORAL
  Filled 2013-06-23 (×6): qty 1

## 2013-06-23 MED ORDER — DEXTROSE 5 % IV SOLN
1.0000 g | INTRAVENOUS | Status: DC
  Administered 2013-06-23 – 2013-06-26 (×4): 1 g via INTRAVENOUS
  Filled 2013-06-23 (×6): qty 10

## 2013-06-23 MED ORDER — SODIUM CHLORIDE 0.9 % IV SOLN
INTRAVENOUS | Status: DC
  Administered 2013-06-23 – 2013-06-24 (×3): via INTRAVENOUS

## 2013-06-23 MED ORDER — OXYCODONE HCL 5 MG PO TABS
5.0000 mg | ORAL_TABLET | ORAL | Status: DC | PRN
  Administered 2013-06-23 – 2013-06-28 (×14): 5 mg via ORAL
  Filled 2013-06-23 (×14): qty 1

## 2013-06-23 MED ORDER — ONDANSETRON HCL 4 MG/2ML IJ SOLN: 4.0000 mg | Freq: Four times a day (QID) | INTRAMUSCULAR | Status: DC | PRN

## 2013-06-23 MED ORDER — DEXTROSE 5 % IV SOLN
INTRAVENOUS | Status: AC
  Filled 2013-06-23: qty 10

## 2013-06-23 MED ORDER — ENOXAPARIN SODIUM 40 MG/0.4ML ~~LOC~~ SOLN
40.0000 mg | SUBCUTANEOUS | Status: DC
  Administered 2013-06-24 – 2013-06-28 (×5): 40 mg via SUBCUTANEOUS
  Filled 2013-06-23 (×5): qty 0.4

## 2013-06-23 MED ORDER — HYDROMORPHONE HCL PF 1 MG/ML IJ SOLN: 0.5000 mg | INTRAMUSCULAR | Status: DC | PRN

## 2013-06-23 MED ORDER — ACETAMINOPHEN 650 MG RE SUPP: 650.0000 mg | Freq: Four times a day (QID) | RECTAL | Status: DC | PRN

## 2013-06-23 MED ORDER — SODIUM CHLORIDE 0.9 % IJ SOLN
3.0000 mL | Freq: Two times a day (BID) | INTRAMUSCULAR | Status: DC
  Administered 2013-06-24 – 2013-06-27 (×8): 3 mL via INTRAVENOUS

## 2013-06-23 MED ORDER — SODIUM CHLORIDE 0.9 % IV SOLN: INTRAVENOUS | Status: DC

## 2013-06-23 MED ORDER — ADULT MULTIVITAMIN W/MINERALS CH
1.0000 | ORAL_TABLET | Freq: Every day | ORAL | Status: DC
  Administered 2013-06-23 – 2013-06-28 (×6): 1 via ORAL
  Filled 2013-06-23 (×6): qty 1

## 2013-06-23 MED ORDER — ACETAMINOPHEN 325 MG PO TABS: 650.0000 mg | ORAL_TABLET | Freq: Four times a day (QID) | ORAL | Status: DC | PRN

## 2013-06-23 MED ORDER — PANTOPRAZOLE SODIUM 40 MG PO TBEC
40.0000 mg | DELAYED_RELEASE_TABLET | Freq: Every day | ORAL | Status: DC
  Administered 2013-06-23 – 2013-06-28 (×6): 40 mg via ORAL
  Filled 2013-06-23 (×6): qty 1

## 2013-06-23 MED ORDER — ENOXAPARIN SODIUM 30 MG/0.3ML ~~LOC~~ SOLN
30.0000 mg | SUBCUTANEOUS | Status: DC
  Administered 2013-06-23: 30 mg via SUBCUTANEOUS
  Filled 2013-06-23: qty 0.3

## 2013-06-23 NOTE — Consult Note (Signed)
WOC wound consult note Reason for Consult: Consult requested for breakdown in pannus skin fold.  Called nurse to discuss appearance of patient's skin.  She describes large amt moisture, reddened areas of partial thickness patchy areas of breakdown; appearance and description consistent with intertrigo. Pt is described having a large pannus skin fold. Dressing procedure/placement/frequency: Interdry silver-impregnated fabric ordered for use in skin folds.  This provides antimicrobial benefits and wicks moisture away from skin.  Discussed use and ordering with bedside nurse and orders entered in the EMR.  This topical treatment should be left in place for 5 days to provide optimal treatment. Please re-consult if further assistance is needed.  Thank-you,  Cammie Mcgeeawn Mlissa Tamayo MSN, RN, CWOCN, [place], CNS [phone]

## 2013-06-23 NOTE — Progress Notes (Addendum)
[facility] PROGRESS NOTE  Frederick Brown [id number] DOB: Apr 07, 1950 DOA: 06/22/2013 PCP: Kirk RuthsMCGOUGH,WILLIAM M, MD  Assessment/Plan: 1. AKI on CKD -due to Diuretics, ACE, NSAIDs -hold above meds -hydrate gently  2. Hypotension -IVF -due to poor Po intake, diuretics -add random Cortisol -resume Lopressor as BP tolerates  3. UTI -continue Ceftriaxone -IVF, FU Urine Cx  4. Chronic diastolic CHF -compensated -diuretics on hold  5. Chronic Afib -rate controlled, continue Metoprolol, ASA  6. Gout -check uric acid, flares need Prednisone vs colchicine  -may need Uric acid   7. Pannus with skin breakdown at the inferior edges -wound consult  8. Hyperkalemia -due to AKI, NSAIDs -resolved, given kayexlate 3/24, Bmet in am  Code Status: Full Code Family Communication: none at bedside Disposition Plan: home when stable   Consultants:  Wound RN  HPI/Subjective: Feels weak, but ok otherwise  Objective: Filed Vitals:   06/23/13 1400  BP: 91/45  Pulse: 94  Temp:   Resp: 13    Intake/Output Summary (Last 24 hours) at 06/23/13 1636 Last data filed at 06/23/13 1500  Gross per 24 hour  Intake 1106.25 ml  Output   1425 ml  Net -318.75 ml   Filed Weights   06/22/13 1734 06/23/13 0129  Weight: 178.717 kg (394 lb) 178.6 kg (393 lb 11.9 oz)    Exam:   General:  AAOx3, no distress, morbidly obese  Cardiovascular: S1S2/RRR  Respiratory: CTAB  Abdomen: soft, obese, with large pannus with erythema/swelling at the bases  Musculoskeletal: no edema c/c   Data Reviewed: Basic Metabolic Panel:  Recent Labs Lab 06/22/13 1903 06/23/13 0456  NA 142 145  K 5.7* 4.7  CL 104 111  CO2 23 22  GLUCOSE 117* 90  BUN 73* 63*  CREATININE 1.96* 1.74*  CALCIUM 11.0* 9.9  MG 2.4  --    Liver Function Tests: No results found for this basename: AST, ALT, ALKPHOS, BILITOT, PROT, ALBUMIN,  in the last 168 hours No results found for this basename: LIPASE,  AMYLASE,  in the last 168 hours No results found for this basename: AMMONIA,  in the last 168 hours CBC:  Recent Labs Lab 06/22/13 1903 06/23/13 0456  WBC 7.5 6.8  NEUTROABS 5.6  --   HGB 12.7* 11.2*  HCT 39.5 34.5*  MCV 94.7 95.8  PLT 314 215   Cardiac Enzymes:  Recent Labs Lab 06/22/13 1903  TROPONINI <0.30   BNP (last 3 results) No results found for this basename: PROBNP,  in the last 8760 hours CBG: No results found for this basename: GLUCAP,  in the last 168 hours  Recent Results (from the past 240 hour(s))  MRSA PCR SCREENING     Status: None   Collection Time    06/23/13  1:00 AM      Result Value Ref Range Status   MRSA by PCR NEGATIVE  NEGATIVE Final   Comment:            The GeneXpert MRSA Assay (FDA     approved for NASAL specimens     only), is one component of a     comprehensive MRSA colonization     surveillance program. It is not     intended to diagnose MRSA     infection nor to guide or     monitor treatment for     MRSA infections.     Studies: Dg Chest 2 View  06/22/2013   CLINICAL DATA:  Shortness of breath  EXAM:  CHEST  2 VIEW  COMPARISON:  Prior radiograph from 05/28/2011  FINDINGS: Cardiomegaly is stable as compared to prior study. Mediastinal silhouette within normal limits.  The lungs are normally inflated. No airspace consolidation, pleural effusion, or pulmonary edema is identified. There is no pneumothorax.  No acute osseous abnormality identified. Multilevel degenerative changes noted within the visualized spine.  IMPRESSION: Stable cardiomegaly. No acute cardiopulmonary abnormality identified.   Electronically Signed   By: Rise Mu M.D.   On: 06/22/2013 19:48    Scheduled Meds: . cefTRIAXone (ROCEPHIN)  IV  1 g Intravenous Q24H  . [START ON 06/24/2013] enoxaparin (LOVENOX) injection  40 mg Subcutaneous Q24H  . multivitamin with minerals  1 tablet Oral Daily  . pantoprazole  40 mg Oral Daily  . simvastatin  10 mg Oral  Daily  . sodium chloride  3 mL Intravenous Q12H   Continuous Infusions: . sodium chloride 75 mL/hr at 06/23/13 1500   Antibiotics Given (last 72 hours)   Date/Time Action Medication Dose Rate   06/23/13 0519 Given   cefTRIAXone (ROCEPHIN) 1 g in dextrose 5 % 50 mL IVPB 1 g 100 mL/hr      Principal Problem:   AKI (acute kidney injury) Active Problems:   Atrial fibrillation   Hypercalcemia   Hyperkalemia   Hypotension   Dehydration   Diastolic dysfunction   Gout   Urinary tract infection, site not specified    Time spent:    [facility]  [facility] Pager [phone]. If 7PM-7AM, please contact night-coverage at [email or web address], password [facility] 06/23/2013, 4:36 PM  LOS: 1 day

## 2013-06-23 NOTE — H&P (Signed)
[facility] History and Physical  Frederick Brown ZOX:[id number] DOB: November 28, 1950 DOA: 06/22/2013  Referring physician:  EDP PCP: [name], MD  Specialists:   Chief Complaint: Decreased O2 level  HPI: Frederick Brown is a 63 y.o. male with Multiple Medical problems who was recently discharged to home after a lengthy rehab stay and had been found by the Home health Nurse to be hypoxic with report of O2 sats in the 60's though he denied having any SOB, so he was sent urgently to the ED.  In the ED, he was found to have O2 sats at 100%, however he was found on his laboratory studies to have an elevated BUN/Cr, and multiple Electrolyte abnormalities so he was referred for admission.   He denies having any nausea vomiting or diarrhea or fevers or chills or chest pain.  He does report having sharp  pain in both of his feet and pain in his right great toe.  He reports eating small  amounts of food and not drinking very much fluid.    Review of Systems:  Constitutional:  +Weight Loss( Intentional), No Weight Gain, Night Sweats, Fevers, Chills, Fatigue, or +Generalized Weakness HEENT: No Headaches, Difficulty Swallowing,Tooth/Dental Problems,Sore Throat,  No Sneezing, Rhinitis, Ear Ache, Nasal Congestion, or Post Nasal Drip,  Cardio-vascular:  No Chest pain, Orthopnea, PND, Edema in lower extremities, Anasarca, Dizziness, Palpitations  Resp: No Dyspnea, No DOE, No Productive Cough, No Non-Productive Cough, No Hemoptysis, No Change in Color of Mucus,  No Wheezing.    GI: No Heartburn, Indigestion, Abdominal Pain, Nausea, Vomiting, Diarrhea, Change in Bowel Habits,  +Loss of Appetite  GU: No Dysuria, Change in Color of Urine, No Urgency or Frequency.  No flank pain.  Musculoskeletal: No Joint Pain or Swelling.  No Decreased Range of Motion. No Back Pain.  Neurologic: No Syncope, No Seizures, Muscle Weakness, Paresthesia, Vision Disturbance or Loss, No Diplopia, No Vertigo, No Difficulty  Walking,  Skin: No Rash or Lesions. Psych: No Change in Mood or Affect. No Depression or Anxiety. No Memory loss. No Confusion or Hallucinations   Past Medical History  Diagnosis Date  . Hypertension   . Obesity, morbid (more than 100 lbs over ideal weight or BMI > 40)   . Degenerative joint disease   . Alcohol abuse   . Umbilical hernia   . Gastroesophageal reflux disease   . Vitamin B12 deficiency 05/22/2011  . Fracture of metatarsal of left foot, closed 05/28/2011  . Fracture of great toe, left, closed 05/28/2011  . Chronic respiratory failure with hypoxia 05/28/2011  . Urticarial rash 05/31/2011    From Cipro  . Cor pulmonale, chronic February 2013  . Obesity hypoventilation syndrome February 2013  . Pickwickian syndrome   . CHF (congestive heart failure)       Past Surgical History  Procedure Laterality Date  . Cholecystectomy    . Knee surgery         Prior to Admission medications   Medication Sig Start Date End Date Taking? Authorizing Provider  lisinopril-hydrochlorothiazide (PRINZIDE,ZESTORETIC) 20-12.5 MG per tablet Take 1 tablet by mouth daily.   Yes Historical Provider, MD  metoprolol tartrate (LOPRESSOR) 25 MG tablet Take 25 mg by mouth 2 (two) times daily.   Yes Historical Provider, MD  Multiple Vitamin (MULITIVITAMIN WITH MINERALS) TABS Take 1 tablet by mouth daily. 05/31/11  Yes Elliot Cousin, MD  naproxen (NAPROSYN) 500 MG tablet Take 500 mg by mouth 2 (two) times daily with a meal.  Yes Historical Provider, MD  omeprazole (PRILOSEC) 20 MG capsule Take 20 mg by mouth daily.   Yes Historical Provider, MD  oxyCODONE-acetaminophen (PERCOCET) 10-325 MG per tablet Take 1 tablet by mouth daily as needed. For pain. Normally taken at bedtime 05/05/13  Yes Historical Provider, MD  simvastatin (ZOCOR) 10 MG tablet Take 10 mg by mouth daily.   Yes Historical Provider, MD      Allergies  Allergen Reactions  . Ciprofloxacin Rash    Patient is not aware of the following  allergy     Social History:  reports that he has never smoked. He does not have any smokeless tobacco history on file. He reports that he does not drink alcohol or use illicit drugs.     Family History  Problem Relation Age of Onset  . Heart attack Father   . Heart failure Father   . Stroke Father   . Hypertension Father        Physical Exam:  GEN:  Pleasant Morbidly Obese 63 y.o. Caucasian male examined and in no acute distress; cooperative with exam Filed Vitals:   06/22/13 1734 06/22/13 2058  BP: 106/53 76/63  Pulse: 85 97  Temp: 98.2 F (36.8 C) 97.7 F (36.5 C)  TempSrc: Oral Oral  Resp: 23 20  Height: 5\' 10"  (1.778 m)   Weight: 178.717 kg (394 lb)   SpO2: 100% 90%   Blood pressure 76/63, pulse 97, temperature 97.7 F (36.5 C), temperature source Oral, resp. rate 20, height 5\' 10"  (1.778 m), weight 178.717 kg (394 lb), SpO2 90.00%. PSYCH: He is alert and oriented x4; does not appear anxious does not appear depressed; affect is normal HEENT: Normocephalic and Atraumatic, Mucous membranes pink; PERRLA; EOM intact; Fundi:  Benign;  No scleral icterus, Nares: Patent, Oropharynx: Clear, Fair Dentition, Neck:  FROM, no cervical lymphadenopathy nor thyromegaly or carotid bruit; no JVD; Breasts:: Not examined CHEST WALL: No tenderness CHEST: Normal respiration, clear to auscultation bilaterally HEART: Regular rate and rhythm; no murmurs rubs or gallops BACK: No kyphosis or scoliosis; no CVA tenderness ABDOMEN: Positive Bowel Sounds, Obese, soft non-tender; no masses, no organomegaly, +Pannus; no intertriginous candida. Rectal Exam: Not done EXTREMITIES: +Numerous Firm nodules on Digits,  No cyanosis, clubbing or edema; no ulcerations. Genitalia: not examined PULSES: 2+ and symmetric SKIN: Normal hydration no rash or ulceration CNS:  Vascular: pulses palpable throughout    Labs on Admission:  Basic Metabolic Panel:  Recent Labs Lab 06/22/13 1903  NA 142  K 5.7*   CL 104  CO2 23  GLUCOSE 117*  BUN 73*  CREATININE 1.96*  CALCIUM 11.0*  MG 2.4   Liver Function Tests: No results found for this basename: AST, ALT, ALKPHOS, BILITOT, PROT, ALBUMIN,  in the last 168 hours No results found for this basename: LIPASE, AMYLASE,  in the last 168 hours No results found for this basename: AMMONIA,  in the last 168 hours CBC:  Recent Labs Lab 06/22/13 1903  WBC 7.5  NEUTROABS 5.6  HGB 12.7*  HCT 39.5  MCV 94.7  PLT 314   Cardiac Enzymes:  Recent Labs Lab 06/22/13 1903  TROPONINI <0.30    BNP (last 3 results) No results found for this basename: PROBNP,  in the last 8760 hours CBG: No results found for this basename: GLUCAP,  in the last 168 hours  Radiological Exams on Admission: Dg Chest 2 View  06/22/2013   CLINICAL DATA:  Shortness of breath  EXAM: CHEST  2 VIEW  COMPARISON:  Prior radiograph from 05/28/2011  FINDINGS: Cardiomegaly is stable as compared to prior study. Mediastinal silhouette within normal limits.  The lungs are normally inflated. No airspace consolidation, pleural effusion, or pulmonary edema is identified. There is no pneumothorax.  No acute osseous abnormality identified. Multilevel degenerative changes noted within the visualized spine.  IMPRESSION: Stable cardiomegaly. No acute cardiopulmonary abnormality identified.   Electronically Signed   By: Rise MuBenjamin  McClintock M.D.   On: 06/22/2013 19:48      Assessment/Plan:   63 y.o. male with  AKI / ARF /Dehydration Active Problems:   Atrial fibrillation   Hypercalcemia   AKI (acute kidney injury)   Hyperkalemia   Hypotension   Dehydration   Diastolic dysfunction   Gout   UTI    1.   AKI/ARF/ Dehydration-  Due to Diuretic RX and His Ace inhibitor Rx.  Hold diuretics, and Ace inhibitor, Gentle Rehydration and monitor  BUN/Cr.     2.   Hypercalcemia- monitor calcium levels may be elevated due to Dehydration.     3.   Hypotension- also due to dehydration, improving  with IVFs.    4.  Atrial Fibrillation-   Chronic , on Metoprolol and ASA Rx.     5.   Diastolic Dysfunction-  Monitor his rehydration to avoid fluid overload.    6.   Gout - hx- Check Uric Acid level.     7.  DVT prophylaxis with Lovenox.     8.  UTI- Send Urine C+S, and place on IV Rocephin.  Adjust Pending Cx Results.      Code Status:  FULL CODE  Family Communication:    No Family present Disposition Plan:       Inpatient  Time spent:  2560 Minutes  Ron Brown,Frederick Robson C [facility] Pager [phone]  If 7PM-7AM, please contact night-coverage [email or web address] Password [email or web address] 06/23/2013, 12:08 AM

## 2013-06-24 LAB — BASIC METABOLIC PANEL
BUN: 34 mg/dL — AB (ref 6–23)
CO2: 22 mEq/L (ref 19–32)
CREATININE: 1.2 mg/dL (ref 0.50–1.35)
Calcium: 10 mg/dL (ref 8.4–10.5)
Chloride: 110 mEq/L (ref 96–112)
GFR calc Af Amer: 74 mL/min — ABNORMAL LOW (ref >90)
GFR, EST NON AFRICAN AMERICAN: 64 mL/min — AB (ref >90)
GLUCOSE: 107 mg/dL — AB (ref 70–99)
Potassium: 4.2 mEq/L (ref 3.7–5.3)
SODIUM: 143 meq/L (ref 137–147)

## 2013-06-24 LAB — CORTISOL: Cortisol, Plasma: 19.5 ug/dL

## 2013-06-24 LAB — TSH: TSH: 0.583 u[IU]/mL (ref 0.350–4.500)

## 2013-06-24 LAB — T4, FREE: FREE T4: 1.58 ng/dL (ref 0.80–1.80)

## 2013-06-24 MED ORDER — METOPROLOL TARTRATE 25 MG PO TABS
12.5000 mg | ORAL_TABLET | Freq: Two times a day (BID) | ORAL | Status: DC
  Administered 2013-06-24 – 2013-06-25 (×3): 12.5 mg via ORAL
  Filled 2013-06-24 (×3): qty 1

## 2013-06-24 MED ORDER — ALLOPURINOL 100 MG PO TABS
100.0000 mg | ORAL_TABLET | Freq: Every day | ORAL | Status: DC
  Administered 2013-06-24 – 2013-06-28 (×5): 100 mg via ORAL
  Filled 2013-06-24 (×5): qty 1

## 2013-06-24 MED ORDER — PREDNISONE 20 MG PO TABS
50.0000 mg | ORAL_TABLET | Freq: Every day | ORAL | Status: DC
  Administered 2013-06-24 – 2013-06-28 (×5): 50 mg via ORAL
  Filled 2013-06-24 (×2): qty 2
  Filled 2013-06-24 (×2): qty 1
  Filled 2013-06-24: qty 2
  Filled 2013-06-24: qty 1
  Filled 2013-06-24: qty 2
  Filled 2013-06-24: qty 1
  Filled 2013-06-24: qty 2
  Filled 2013-06-24: qty 1

## 2013-06-24 NOTE — Care Management Utilization Note (Signed)
UR completed 

## 2013-06-24 NOTE — Progress Notes (Signed)
[facility] PROGRESS NOTE  Frederick Brown ZOX:[id number] DOB: 1950-10-07 DOA: 06/22/2013 PCP: Frederick Ruths, MD  Assessment/Plan: 1. AKI on CKD -due to Diuretics, ACE, NSAIDs -hold above meds -improving -cut down IVF  2. Hypotension -not symptomatic, a little better only -due to poor Po intake, diuretics -random Cortisol normal -resume Lopressor as BP tolerates  3. UTI -continue Ceftriaxone -Urine Cx with Ecoli >100K colonies  4. Chronic diastolic CHF -compensated -diuretics on hold  5. Chronic Afib -rate controlled, resume low dose Metoprolol, ASA  6. Gout FLare in feet -uric acid 11.3, start Prednisone and Allopurinol  7. Pannus with skin breakdown at the inferior edges -wound consult appreciated  8. Hyperkalemia -due to AKI, NSAIDs -resolved, given kayexlate 3/24  9. Morbid Obesity with deconditioning -Pt/Ot -ambulate, may need Rehab  DVT proph: lovenox  Code Status: Full Code Family Communication: none at bedside Disposition Plan: home when stable   Consultants:  Wound RN  HPI/Subjective: Feels weak, but ok otherwise  Objective: Filed Vitals:   06/24/13 0800  BP: 92/56  Pulse: 90  Temp: 98 F (36.7 C)  Resp: 18    Intake/Output Summary (Last 24 hours) at 06/24/13 1445 Last data filed at 06/24/13 0700  Gross per 24 hour  Intake   1515 ml  Output   1550 ml  Net    -35 ml   Filed Weights   06/22/13 1734 06/23/13 0129 06/24/13 0500  Weight: 178.717 kg (394 lb) 178.6 kg (393 lb 11.9 oz) 180.1 kg (397 lb 0.8 oz)    Exam:   General:  AAOx3, no distress, morbidly obese  Cardiovascular: S1S2/RRR  Respiratory: CTAB  Abdomen: soft, obese, with large pannus with erythema/swelling at the bases  Musculoskeletal: no edema c/c , erythema and tenderness in L foot at MTP joint  Data Reviewed: Basic Metabolic Panel:  Recent Labs Lab 06/22/13 1903 06/23/13 0456 06/24/13 0927  NA 142 145 143  K 5.7* 4.7 4.2  CL 104 111 110   CO2 23 22 22   GLUCOSE 117* 90 107*  BUN 73* 63* 34*  CREATININE 1.96* 1.74* 1.20  CALCIUM 11.0* 9.9 10.0  MG 2.4  --   --    Liver Function Tests: No results found for this basename: AST, ALT, ALKPHOS, BILITOT, PROT, ALBUMIN,  in the last 168 hours No results found for this basename: LIPASE, AMYLASE,  in the last 168 hours No results found for this basename: AMMONIA,  in the last 168 hours CBC:  Recent Labs Lab 06/22/13 1903 06/23/13 0456  WBC 7.5 6.8  NEUTROABS 5.6  --   HGB 12.7* 11.2*  HCT 39.5 34.5*  MCV 94.7 95.8  PLT 314 215   Cardiac Enzymes:  Recent Labs Lab 06/22/13 1903  TROPONINI <0.30   BNP (last 3 results) No results found for this basename: PROBNP,  in the last 8760 hours CBG: No results found for this basename: GLUCAP,  in the last 168 hours  Recent Results (from the past 240 hour(s))  MRSA PCR SCREENING     Status: None   Collection Time    06/23/13  1:00 AM      Result Value Ref Range Status   MRSA by PCR NEGATIVE  NEGATIVE Final   Comment:            The GeneXpert MRSA Assay (FDA     approved for NASAL specimens     only), is one component of a     comprehensive MRSA colonization  surveillance program. It is not     intended to diagnose MRSA     infection nor to guide or     monitor treatment for     MRSA infections.  URINE CULTURE     Status: None   Collection Time    06/23/13 10:00 AM      Result Value Ref Range Status   Specimen Description URINE, CLEAN CATCH   Final   Special Requests NONE   Final   Culture  Setup Time     Final   Value: 06/23/2013 14:29     Performed at [facility]   Colony Count     Final   Value: >=100,000 COLONIES/ML     Performed at [facility]   Culture     Final   Value: ESCHERICHIA COLI     Performed at [facility]   Report Status PENDING   Incomplete     Studies: Dg Chest 2 View  06/22/2013   CLINICAL DATA:  Shortness of breath  EXAM: CHEST  2 VIEW  COMPARISON:   Prior radiograph from 05/28/2011  FINDINGS: Cardiomegaly is stable as compared to prior study. Mediastinal silhouette within normal limits.  The lungs are normally inflated. No airspace consolidation, pleural effusion, or pulmonary edema is identified. There is no pneumothorax.  No acute osseous abnormality identified. Multilevel degenerative changes noted within the visualized spine.  IMPRESSION: Stable cardiomegaly. No acute cardiopulmonary abnormality identified.   Electronically Signed   By: Rise MuBenjamin  McClintock M.D.   On: 06/22/2013 19:48    Scheduled Meds: . allopurinol  100 mg Oral Daily  . cefTRIAXone (ROCEPHIN)  IV  1 g Intravenous Q24H  . enoxaparin (LOVENOX) injection  40 mg Subcutaneous Q24H  . metoprolol tartrate  12.5 mg Oral BID  . multivitamin with minerals  1 tablet Oral Daily  . pantoprazole  40 mg Oral Daily  . predniSONE  50 mg Oral Q breakfast  . simvastatin  10 mg Oral Daily  . sodium chloride  3 mL Intravenous Q12H   Continuous Infusions:   Antibiotics Given (last 72 hours)   Date/Time Action Medication Dose Rate   06/23/13 0519 Given   cefTRIAXone (ROCEPHIN) 1 g in dextrose 5 % 50 mL IVPB 1 g 100 mL/hr   06/24/13 0414 Given   cefTRIAXone (ROCEPHIN) 1 g in dextrose 5 % 50 mL IVPB 1 g 100 mL/hr      Principal Problem:   AKI (acute kidney injury) Active Problems:   Atrial fibrillation   Hypercalcemia   Hyperkalemia   Hypotension   Dehydration   Diastolic dysfunction   Gout   Urinary tract infection, site not specified    Time spent: 35min    [facility] CenterJOSEPH,[name]  [facility] Pager [phone]. If 7PM-7AM, please contact night-coverage at [email or web address], password [email or web address] 06/24/2013, 2:45 PM  LOS: 2 days

## 2013-06-24 NOTE — Evaluation (Signed)
Physical Therapy Evaluation Patient Details Name: Frederick Brown Lins MRN: [id number] DOB: 10/15/1950 Today's Date: 06/24/2013   History of Present Illness  Frederick Brown Conaway is a 63 y.o. male with Multiple Medical problems who was recently discharged to home after a lengthy rehab stay and had been found by the Home health Nurse to be hypoxic with report of O2 sats in the 60's though he denied having any SOB, so he was sent urgently to the ED.  In the ED, he was found to have O2 sats at 100%, however he was found on his laboratory studies to have an elevated BUN/Cr, and multiple Electrolyte abnormalities so he was referred for admission  Clinical Impression  PT is a 63 yo male who has recently been discharged from 30 days at SNF level and is receiving HH therapy.  Pt Currently will only agree to complete bed exercises as he has a flare up of gout and has just started on his medication.  Pt feels [facility] be [facility].    [facility]ations  [facility]    Equipment Recommendations    none   Recommendations for Other Services   OT    Precautions / Restrictions Precautions Precautions: Fall Restrictions Weight Bearing Restrictions: No      Mobility  Refuses        Balance                                     Pertinent Vitals/Pain 9/10 toe due to gout    Home Living Family/patient expects to be discharged to:: Private residence Living Arrangements: Alone Available Help at Discharge: Available PRN/intermittently Type of Home: House Home Access: Stairs to enter Entrance Stairs-Rails: None Entrance Stairs-Number of Steps: 1 (small 4") Home Layout: One level Home Equipment: Walker - 4 wheels;Walker - 2 wheels;Wheelchair - manual      Prior Function Level of Independence: Independent with assistive device(s)          Hand Dominance   Dominant Hand: Right    Extremity/Trunk Assessment      Lower Extremity Assessment:  Generalized weakness         Communication   Communication: No difficulties  Cognition Arousal/Alertness: Awake/alert   Overall Cognitive Status: Within Functional Limits for tasks assessed                            Exercises General Exercises - Lower Extremity Ankle Circles/Pumps: 10 reps;Both Quad Sets: Both;10 reps Gluteal Sets: Both;10 reps Heel Slides: Both;10 reps Hip ABduction/ADduction: Both;10 reps      Assessment/Plan    PT Assessment Patient needs continued PT services  PT Diagnosis Difficulty walking;Generalized weakness;Acute pain   PT Problem List Decreased strength;Decreased activity tolerance;Decreased mobility;Pain  PT Treatment Interventions Gait training;Therapeutic exercise;Functional mobility training   PT Goals (Current goals can be found in the Care Plan section) Acute Rehab PT Goals Patient Stated Goal: home with hh PT Goal Formulation: With patient Time For Goal Achievement: 06/28/13 Potential to Achieve Goals: Good    Frequency Min 3X/week   Barriers to discharge Decreased caregiver support      End of Session   Activity Tolerance: Patient tolerated treatment well Patient left: in bed         Time: 0900-0922 PT Time Calculation (min): 22 min   Charges:  PT Evaluation $Initial PT Evaluation Tier I: 1 Procedure     PT G Codes:          RUSSELL,CINDY 07/17/13, 9:31 AM

## 2013-06-24 NOTE — Progress Notes (Signed)
Report called to M.Wright,RN. Patient transferred to 319 via bed in stable condition.

## 2013-06-25 LAB — CBC
HEMATOCRIT: 34.2 % — AB (ref 39.0–52.0)
Hemoglobin: 11.2 g/dL — ABNORMAL LOW (ref 13.0–17.0)
MCH: 30.6 pg (ref 26.0–34.0)
MCHC: 32.7 g/dL (ref 30.0–36.0)
MCV: 93.4 fL (ref 78.0–100.0)
Platelets: 210 10*3/uL (ref 150–400)
RBC: 3.66 MIL/uL — ABNORMAL LOW (ref 4.22–5.81)
RDW: 13.4 % (ref 11.5–15.5)
WBC: 6.1 10*3/uL (ref 4.0–10.5)

## 2013-06-25 LAB — URINE CULTURE

## 2013-06-25 LAB — BASIC METABOLIC PANEL
BUN: 25 mg/dL — ABNORMAL HIGH (ref 6–23)
CHLORIDE: 107 meq/L (ref 96–112)
CO2: 23 mEq/L (ref 19–32)
CREATININE: 1.1 mg/dL (ref 0.50–1.35)
Calcium: 10.1 mg/dL (ref 8.4–10.5)
GFR calc Af Amer: 82 mL/min — ABNORMAL LOW (ref >90)
GFR calc non Af Amer: 71 mL/min — ABNORMAL LOW (ref >90)
GLUCOSE: 109 mg/dL — AB (ref 70–99)
Potassium: 4.3 mEq/L (ref 3.7–5.3)
Sodium: 141 mEq/L (ref 137–147)

## 2013-06-25 MED ORDER — METOPROLOL TARTRATE 25 MG PO TABS
25.0000 mg | ORAL_TABLET | Freq: Two times a day (BID) | ORAL | Status: DC
  Administered 2013-06-25 – 2013-06-28 (×6): 25 mg via ORAL
  Filled 2013-06-25 (×6): qty 1

## 2013-06-25 MED ORDER — METOPROLOL TARTRATE 25 MG PO TABS
12.5000 mg | ORAL_TABLET | Freq: Once | ORAL | Status: AC
  Administered 2013-06-25: 12.5 mg via ORAL
  Filled 2013-06-25: qty 1

## 2013-06-25 MED ORDER — COLCHICINE 0.6 MG PO TABS
0.6000 mg | ORAL_TABLET | Freq: Two times a day (BID) | ORAL | Status: AC
  Administered 2013-06-25 (×2): 0.6 mg via ORAL
  Filled 2013-06-25 (×2): qty 1

## 2013-06-25 NOTE — Care Management Note (Addendum)
    Page 1 of 2   06/28/2013     1:20:32 PM   CARE MANAGEMENT NOTE 06/28/2013  Patient:  Frederick CanterburySTRADER,Frederick H   Account Number:  [id number]  Date Initiated:  06/25/2013  Documentation initiated by:  Anibal HendersonBOLDEN,GENEVA  Subjective/Objective Assessment:   Pt admitted with UTI and AKI, dehydration. He lives at home alone, and is able to mostly care for himself. He has been sick recentlu and spent a month in rehab, but has been home for 2 weeks. He has [facility] active in the home and would     Action/Plan:   [facility]  states he has no DME needs    Will need resumption of care order at D/C  Notified [facility] of patient's admission   Anticipated DC Date:  06/28/2013   Anticipated DC Plan:  HOME W HOME HEALTH SERVICES      DC Planning Services  CM consult      [facility]   Choice offered to / List presented to:  C-1 Patient        HH arranged  HH-1 RN  HH-10 DISEASE MANAGEMENT  HH-2 PT  HH-4 NURSE'S AIDE      HH agency  [facility]   Status of service:  Completed, signed off Medicare Important Message given?  YES (If response is "NO", the following Medicare IM given date fields will be blank) Date Medicare IM given:  06/25/2013 Date Additional Medicare IM given:  06/28/2013  Discharge Disposition:    Per UR Regulation:  Reviewed for med. necessity/level of care/duration of stay  If discussed at Long Length of Stay Meetings, dates discussed:    Comments:  06/25/13 1600 Anibal HendersonGeneva Bolden RN/CM

## 2013-06-25 NOTE — Progress Notes (Signed)
[facility] PROGRESS NOTE  TREYTON SLIMP ZOX:[id number] DOB: Feb 19, 1951 DOA: 06/22/2013 PCP: [name], MD  Assessment/Plan: 1. AKI on CKD -due to Diuretics, ACE, NSAIDs -hold above meds -improved with IVF  2. Hypotension -not symptomatic, improved -due to poor Po intake, diuretics -random Cortisol normal -increase lopressor back to home dose  3. UTI -continue Ceftriaxone, change to PO pending sensitivities -Urine Cx with Ecoli >100K colonies  4. Chronic diastolic CHF -compensated -diuretics on hold -start lasix tomorrow  5. Chronic Afib -rate controlled, resume low dose Metoprolol, ASA -not felt to be an anticoagulation candidate based on prior notes -defer to PCP  6. Gout FLare in feet -uric acid 11.3, continue Prednisone and Allopurinol -add colchicine -DC HCTZ at discharge  7. Pannus with skin breakdown at the inferior edges -wound consult appreciated  8. Hyperkalemia -due to AKI, NSAIDs -resolved, given kayexlate 3/24  9. Morbid Obesity with deconditioning -Pt/Ot -ambulate, may need Rehab vs HH PT   DVT proph: lovenox  Code Status: Full Code Family Communication: none at bedside Disposition Plan: home in next 1-2days   Consultants:  Wound RN  HPI/Subjective: Feels better, Both Toes MTP joints still painful  Objective: Filed Vitals:   06/25/13 0534  BP: 115/73  Pulse: 71  Temp: 97.6 F (36.4 C)  Resp: 18    Intake/Output Summary (Last 24 hours) at 06/25/13 0957 Last data filed at 06/25/13 0941  Gross per 24 hour  Intake    296 ml  Output   1400 ml  Net  -1104 ml   Filed Weights   06/22/13 1734 06/23/13 0129 06/24/13 0500  Weight: 178.717 kg (394 lb) 178.6 kg (393 lb 11.9 oz) 180.1 kg (397 lb 0.8 oz)    Exam:   General:  AAOx3, no distress, morbidly obese  Cardiovascular: S1S2/RRR  Respiratory: CTAB  Abdomen: soft, obese, with large pannus with erythema/swelling at the bases  Musculoskeletal: no edema c/c  , erythema and tenderness in L foot at MTP joint  Data Reviewed: Basic Metabolic Panel:  Recent Labs Lab 06/22/13 1903 06/23/13 0456 06/24/13 0927 06/25/13 0446  NA 142 145 143 141  K 5.7* 4.7 4.2 4.3  CL 104 111 110 107  CO2 23 22 22 23   GLUCOSE 117* 90 107* 109*  BUN 73* 63* 34* 25*  CREATININE 1.96* 1.74* 1.20 1.10  CALCIUM 11.0* 9.9 10.0 10.1  MG 2.4  --   --   --    Liver Function Tests: No results found for this basename: AST, ALT, ALKPHOS, BILITOT, PROT, ALBUMIN,  in the last 168 hours No results found for this basename: LIPASE, AMYLASE,  in the last 168 hours No results found for this basename: AMMONIA,  in the last 168 hours CBC:  Recent Labs Lab 06/22/13 1903 06/23/13 0456 06/25/13 0446  WBC 7.5 6.8 6.1  NEUTROABS 5.6  --   --   HGB 12.7* 11.2* 11.2*  HCT 39.5 34.5* 34.2*  MCV 94.7 95.8 93.4  PLT 314 215 210   Cardiac Enzymes:  Recent Labs Lab 06/22/13 1903  TROPONINI <0.30   BNP (last 3 results) No results found for this basename: PROBNP,  in the last 8760 hours CBG: No results found for this basename: GLUCAP,  in the last 168 hours  Recent Results (from the past 240 hour(s))  MRSA PCR SCREENING     Status: None   Collection Time    06/23/13  1:00 AM      Result Value Ref Range Status  MRSA by PCR NEGATIVE  NEGATIVE Final   Comment:            The GeneXpert MRSA Assay (FDA     approved for NASAL specimens     only), is one component of a     comprehensive MRSA colonization     surveillance program. It is not     intended to diagnose MRSA     infection nor to guide or     monitor treatment for     MRSA infections.  URINE CULTURE     Status: None   Collection Time    06/23/13 10:00 AM      Result Value Ref Range Status   Specimen Description URINE, CLEAN CATCH   Final   Special Requests NONE   Final   Culture  Setup Time     Final   Value: 06/23/2013 14:29     Performed at [facility]   Colony Count     Final   Value:  >=100,000 COLONIES/ML     Performed at [facility]   Culture     Final   Value: ESCHERICHIA COLI     Performed at [facility]   Report Status PENDING   Incomplete     Studies: No results found.  Scheduled Meds: . allopurinol  100 mg Oral Daily  . cefTRIAXone (ROCEPHIN)  IV  1 g Intravenous Q24H  . colchicine  0.6 mg Oral BID  . enoxaparin (LOVENOX) injection  40 mg Subcutaneous Q24H  . metoprolol tartrate  12.5 mg Oral BID  . multivitamin with minerals  1 tablet Oral Daily  . pantoprazole  40 mg Oral Daily  . predniSONE  50 mg Oral Q breakfast  . simvastatin  10 mg Oral Daily  . sodium chloride  3 mL Intravenous Q12H   Continuous Infusions:   Antibiotics Given (last 72 hours)   Date/Time Action Medication Dose Rate   06/23/13 0519 Given   cefTRIAXone (ROCEPHIN) 1 g in dextrose 5 % 50 mL IVPB 1 g 100 mL/hr   06/24/13 0414 Given   cefTRIAXone (ROCEPHIN) 1 g in dextrose 5 % 50 mL IVPB 1 g 100 mL/hr   06/25/13 0409 Given   cefTRIAXone (ROCEPHIN) 1 g in dextrose 5 % 50 mL IVPB 1 g 100 mL/hr      Principal Problem:   AKI (acute kidney injury) Active Problems:   Atrial fibrillation   Hypercalcemia   Hyperkalemia   Hypotension   Dehydration   Diastolic dysfunction   Gout   Urinary tract infection, site not specified    Time spent: 35min    [facility]SEPH,Jenell Dobransky  [facility] Pager [phone]. If 7PM-7AM, please contact night-coverage at [email or web address], password [email or web address] 06/25/2013, 9:57 AM  LOS: 3 days

## 2013-06-25 NOTE — Progress Notes (Signed)
06/25/13 1655 Patient expressed concerns regarding prescriptions at discharge if he is discharged on Sunday. [facility] and they close early at 11 am on Sunday. States family member could pick up his prescriptions tomorrow if possible for  MD to call them in prior to discharge. Notified Dr. Jomarie LongsJoseph. Stated will address tomorrow. Pt aware and stated okay. Earnstine RegalAshley Micharl Helmes, RN

## 2013-06-25 NOTE — Progress Notes (Signed)
Physical Therapy Treatment Patient Details Name: Frederick Brown MRN: [id number] DOB: [birth year] Today's Date: 06/25/2013    History of Present Illness      PT Comments    Pt limited by pain in Rt foot.  Pt very cooperative and eager to participate.  Bed exercises complete for LE ROM and strengthening.  Majority of exercises completed sitting on EOB.  Pt able to complete supine to sitting on EOB independently with multiple attempts, verbal cueing for handplacements and raised bed rail for assistance.  Pt refused to complete standing due to Rt foot pain increase with pressure on floor.  Pt left sitting on EOB for lunch, pt encouraged to sit up as long as possible to reduce risk of further problems (ex: pnemonia.)  Follow Up Recommendations        Equipment Recommendations       Recommendations for Other Services       Precautions / Restrictions Precautions Precautions: Fall Restrictions Weight Bearing Restrictions: No    Mobility  Bed Mobility Overal bed mobility: Independent             General bed mobility comments: Pt completed supine to sit independently with several attempts made, verbal cueing and bed rails put up for handplacement assistance.  Transfers                 General transfer comment: Increased pain with foot on floor, pt refused to stand due to increased pain     Wheelchair Mobility    Modified Rankin (Stroke Patients Only)       Balance                                    Cognition Arousal/Alertness: Awake/alert Behavior During Therapy: WFL for tasks assessed/performed Overall Cognitive Status: Within Functional Limits for tasks assessed                      Exercises General Exercises - Lower Extremity Ankle Circles/Pumps: 10 reps;Both;Supine Quad Sets: Both;10 reps;Supine Long Arc Quad: AROM;Both;10 reps;Seated Heel Slides: Both;10 reps Hip ABduction/ADduction: Both;10 reps;Seated Toe Raises:  AROM;Both;10 reps;Seated Heel Raises: AROM;Both;Seated;10 reps    General Comments        Home Living                      Prior Function            PT Goals (current goals can now be found in the care plan section) Progress towards PT goals: Progressing toward goals    Frequency       PT Plan      End of Session   Activity Tolerance: Patient limited by pain;Patient tolerated treatment well Patient left: in bed;with call bell/phone within reach     Time: 1155-1220 PT Time Calculation (min): 25 min  Charges:  $Therapeutic Exercise: 8-22 mins $Therapeutic Activity: 8-22 mins                    G Codes:      Juel BurrowCockerham, Arlette Schaad Jo 06/25/2013, 12:27 PM

## 2013-06-26 DIAGNOSIS — I5032 Chronic diastolic (congestive) heart failure: Secondary | ICD-10-CM

## 2013-06-26 DIAGNOSIS — I4891 Unspecified atrial fibrillation: Secondary | ICD-10-CM

## 2013-06-26 DIAGNOSIS — I959 Hypotension, unspecified: Secondary | ICD-10-CM

## 2013-06-26 DIAGNOSIS — N390 Urinary tract infection, site not specified: Secondary | ICD-10-CM

## 2013-06-26 LAB — BASIC METABOLIC PANEL
BUN: 25 mg/dL — AB (ref 6–23)
CALCIUM: 10.5 mg/dL (ref 8.4–10.5)
CHLORIDE: 105 meq/L (ref 96–112)
CO2: 27 meq/L (ref 19–32)
CREATININE: 1.06 mg/dL (ref 0.50–1.35)
GFR calc Af Amer: 86 mL/min — ABNORMAL LOW (ref >90)
GFR calc non Af Amer: 74 mL/min — ABNORMAL LOW (ref >90)
Glucose, Bld: 97 mg/dL (ref 70–99)
Potassium: 4 mEq/L (ref 3.7–5.3)
Sodium: 141 mEq/L (ref 137–147)

## 2013-06-26 MED ORDER — ALLOPURINOL 100 MG PO TABS: 100.0000 mg | ORAL_TABLET | Freq: Every day | ORAL | Status: DC

## 2013-06-26 MED ORDER — KETOROLAC TROMETHAMINE 15 MG/ML IJ SOLN
15.0000 mg | Freq: Four times a day (QID) | INTRAMUSCULAR | Status: AC
  Administered 2013-06-26 (×2): 15 mg via INTRAVENOUS
  Filled 2013-06-26 (×3): qty 1

## 2013-06-26 MED ORDER — PREDNISONE 20 MG PO TABS: 20.0000 mg | ORAL_TABLET | Freq: Every day | ORAL | Status: DC

## 2013-06-26 MED ORDER — FUROSEMIDE 20 MG PO TABS: 20.0000 mg | ORAL_TABLET | Freq: Every day | ORAL | Status: DC

## 2013-06-26 MED ORDER — ASPIRIN 325 MG PO TABS
325.0000 mg | ORAL_TABLET | Freq: Every day | ORAL | Status: DC
  Administered 2013-06-26 – 2013-06-28 (×3): 325 mg via ORAL
  Filled 2013-06-26 (×3): qty 1

## 2013-06-26 MED ORDER — COLCHICINE 0.6 MG PO TABS
0.6000 mg | ORAL_TABLET | Freq: Every day | ORAL | Status: AC
  Administered 2013-06-26: 0.6 mg via ORAL
  Filled 2013-06-26: qty 1

## 2013-06-26 MED ORDER — CIPROFLOXACIN HCL 250 MG PO TABS
500.0000 mg | ORAL_TABLET | Freq: Two times a day (BID) | ORAL | Status: DC
  Administered 2013-06-26 – 2013-06-28 (×5): 500 mg via ORAL
  Filled 2013-06-26 (×5): qty 2

## 2013-06-26 MED ORDER — KETOROLAC TROMETHAMINE 15 MG/ML IJ SOLN: 15.0000 mg | Freq: Four times a day (QID) | INTRAMUSCULAR | Status: DC | PRN

## 2013-06-26 NOTE — Progress Notes (Signed)
[facility] PROGRESS NOTE  Frederick Brown WUJ:[id number] DOB: Aug 21, 1950 DOA: 06/22/2013 PCP: [name], MD  Assessment/Plan: 1. AKI on CKD -due to Diuretics, ACE, NSAIDs -hold above meds -improved with IVF, stop IVF  2. Hypotension -not symptomatic, improved -due to poor Po intake, diuretics -random Cortisol normal -increased lopressor back to home dose  3. UTI -s/p 3days of Ceftriaxone, change to PO Ciproflox -Urine Cx with Ecoli >100K colonies, pansensitive  4. Chronic diastolic CHF -compensated -diuretics on hold -start lasix tomorrow  5. Chronic Afib -rate controlled, resume low dose Metoprolol, ASA -not felt to be an anticoagulation candidate based on prior notes -defer to PCP  6. Gout FLare in feet -uric acid 11.3, continue Prednisone and Allopurinol -and colchicine today, due to severe pain will give Toradol x2 doses -DC HCTZ at discharge  7. Pannus with skin breakdown at the inferior edges -wound consult appreciated  8. Hyperkalemia -due to AKI, NSAIDs -resolved, given kayexlate 3/24 -stopped ACE  9. Morbid Obesity with deconditioning -Pt/Ot -ambulate, may need Rehab vs HH PT   DVT proph: lovenox  Code Status: Full Code Family Communication: none at bedside Disposition Plan: home tomorrow if stable   Consultants:  Wound RN  HPI/Subjective: Feels better, Both Toes MTP joints still painful  Objective: Filed Vitals:   06/26/13 0547  BP: 124/75  Pulse: 74  Temp: 97.7 F (36.5 C)  Resp: 20    Intake/Output Summary (Last 24 hours) at 06/26/13 1039 Last data filed at 06/26/13 9562  Gross per 24 hour  Intake    483 ml  Output   1350 ml  Net   -867 ml   Filed Weights   06/22/13 1734 06/23/13 0129 06/24/13 0500  Weight: 178.717 kg (394 lb) 178.6 kg (393 lb 11.9 oz) 180.1 kg (397 lb 0.8 oz)    Exam:   General:  AAOx3, no distress, morbidly obese  Cardiovascular: S1S2/RRR  Respiratory: CTAB  Abdomen: soft, obese,  with large pannus with erythema/swelling at the bases  Musculoskeletal: no edema c/c , erythema and tenderness in L foot at MTP joint, only slightly better  Data Reviewed: Basic Metabolic Panel:  Recent Labs Lab 06/22/13 1903 06/23/13 0456 06/24/13 0927 06/25/13 0446 06/26/13 0616  NA 142 145 143 141 141  K 5.7* 4.7 4.2 4.3 4.0  CL 104 111 110 107 105  CO2 23 22 22 23 27   GLUCOSE 117* 90 107* 109* 97  BUN 73* 63* 34* 25* 25*  CREATININE 1.96* 1.74* 1.20 1.10 1.06  CALCIUM 11.0* 9.9 10.0 10.1 10.5  MG 2.4  --   --   --   --    Liver Function Tests: No results found for this basename: AST, ALT, ALKPHOS, BILITOT, PROT, ALBUMIN,  in the last 168 hours No results found for this basename: LIPASE, AMYLASE,  in the last 168 hours No results found for this basename: AMMONIA,  in the last 168 hours CBC:  Recent Labs Lab 06/22/13 1903 06/23/13 0456 06/25/13 0446  WBC 7.5 6.8 6.1  NEUTROABS 5.6  --   --   HGB 12.7* 11.2* 11.2*  HCT 39.5 34.5* 34.2*  MCV 94.7 95.8 93.4  PLT 314 215 210   Cardiac Enzymes:  Recent Labs Lab 06/22/13 1903  TROPONINI <0.30   BNP (last 3 results) No results found for this basename: PROBNP,  in the last 8760 hours CBG: No results found for this basename: GLUCAP,  in the last 168 hours  Recent Results (from the past  240 hour(s))  MRSA PCR SCREENING     Status: None   Collection Time    06/23/13  1:00 AM      Result Value Ref Range Status   MRSA by PCR NEGATIVE  NEGATIVE Final   Comment:            The GeneXpert MRSA Assay (FDA     approved for NASAL specimens     only), is one component of a     comprehensive MRSA colonization     surveillance program. It is not     intended to diagnose MRSA     infection nor to guide or     monitor treatment for     MRSA infections.  URINE CULTURE     Status: None   Collection Time    06/23/13 10:00 AM      Result Value Ref Range Status   Specimen Description URINE, CLEAN CATCH   Final   Special  Requests NONE   Final   Culture  Setup Time     Final   Value: 06/23/2013 14:29     Performed at [facility]   Colony Count     Final   Value: >=100,000 COLONIES/ML     Performed at [facility]   Culture     Final   Value: ESCHERICHIA COLI     Performed at [facility]   Report Status 06/25/2013 FINAL   Final   Organism ID, Bacteria ESCHERICHIA COLI   Final     Studies: No results found.  Scheduled Meds: . allopurinol  100 mg Oral Daily  . ciprofloxacin  500 mg Oral BID  . colchicine  0.6 mg Oral Daily  . enoxaparin (LOVENOX) injection  40 mg Subcutaneous Q24H  . metoprolol tartrate  25 mg Oral BID  . multivitamin with minerals  1 tablet Oral Daily  . pantoprazole  40 mg Oral Daily  . predniSONE  50 mg Oral Q breakfast  . simvastatin  10 mg Oral Daily  . sodium chloride  3 mL Intravenous Q12H   Continuous Infusions:   Antibiotics Given (last 72 hours)   Date/Time Action Medication Dose Rate   06/24/13 0414 Given   cefTRIAXone (ROCEPHIN) 1 g in dextrose 5 % 50 mL IVPB 1 g 100 mL/hr   06/25/13 0409 Given   cefTRIAXone (ROCEPHIN) 1 g in dextrose 5 % 50 mL IVPB 1 g 100 mL/hr   06/26/13 0524 Given   cefTRIAXone (ROCEPHIN) 1 g in dextrose 5 % 50 mL IVPB 1 g 100 mL/hr      Principal Problem:   AKI (acute kidney injury) Active Problems:   Atrial fibrillation   Hypercalcemia   Hyperkalemia   Hypotension   Dehydration   Diastolic dysfunction   Gout   Urinary tract infection, site not specified    Time spent: 35min    [facility]SEPH,Faye Sanfilippo  [facility] Pager [phone]. If 7PM-7AM, please contact night-coverage at [email or web address], password [email or web address] 06/26/2013, 10:39 AM  LOS: 4 days

## 2013-06-27 LAB — BASIC METABOLIC PANEL
BUN: 32 mg/dL — AB (ref 6–23)
CALCIUM: 10.2 mg/dL (ref 8.4–10.5)
CO2: 24 mEq/L (ref 19–32)
Chloride: 104 mEq/L (ref 96–112)
Creatinine, Ser: 1.19 mg/dL (ref 0.50–1.35)
GFR calc non Af Amer: 64 mL/min — ABNORMAL LOW (ref >90)
GFR, EST AFRICAN AMERICAN: 74 mL/min — AB (ref >90)
Glucose, Bld: 89 mg/dL (ref 70–99)
Potassium: 4.4 mEq/L (ref 3.7–5.3)
Sodium: 140 mEq/L (ref 137–147)

## 2013-06-27 MED ORDER — FUROSEMIDE 20 MG PO TABS
20.0000 mg | ORAL_TABLET | Freq: Every day | ORAL | Status: DC
  Administered 2013-06-27 – 2013-06-28 (×2): 20 mg via ORAL
  Filled 2013-06-27 (×2): qty 1

## 2013-06-27 MED ORDER — COLCHICINE 0.6 MG PO TABS
0.6000 mg | ORAL_TABLET | Freq: Two times a day (BID) | ORAL | Status: AC
  Administered 2013-06-27: 0.6 mg via ORAL
  Filled 2013-06-27: qty 1

## 2013-06-27 NOTE — Progress Notes (Signed)
Pt asks to not be awaken early in the morning for vital signs and labs. The nurse tech and lab tech has been notified.

## 2013-06-27 NOTE — Progress Notes (Signed)
[facility] PROGRESS NOTE  Frederick CanterburyJames H Mallinger JYN:[id number] DOB: 01-20-1951 DOA: 06/22/2013 PCP: Kirk RuthsMCGOUGH,WILLIAM M, MD  Assessment/Plan: 1. AKI on CKD -due to Diuretics, ACE, NSAIDs -hold above meds -improved with IVF,  -off IVF  2. Hypotension -not symptomatic, improved -due to poor Po intake, diuretics -random Cortisol normal -increased lopressor back to home dose  3. UTI -s/p 3days of Ceftriaxone, changed to PO Ciproflox for 2 more days -Urine Cx with Ecoli >100K colonies, pansensitive  4. Chronic diastolic CHF -compensated -diuretics on hold -start lasix today  5. Chronic Afib -rate controlled, resume low dose Metoprolol, ASA -not felt to be an anticoagulation candidate based on prior notes -defer to PCP  6. Gout FLare in feet -uric acid 11.3, continue Prednisone and Allopurinol -increase colchicine today, due to severe pain will gived Toradol x2 doses 3/28 -DC HCTZ at discharge  7. Pannus with skin breakdown at the inferior edges -wound consult appreciated  8. Hyperkalemia -due to AKI, NSAIDs -resolved, given kayexlate 3/24 -stopped ACE  9. Morbid Obesity with deconditioning -Pt/Ot -ambulate,   SUSPECT He needs Rehab, morbidly obese, very deconditioned and weak and with gout flare improving very slowly, unable to ambulate at this time  DVT proph: lovenox  Code Status: Full Code Family Communication: none at bedside Disposition Plan: SNF   Consultants:  Wound RN  HPI/Subjective: Feels better, Both Toes MTP joints still quite painful  Objective: Filed Vitals:   06/26/13 2133  BP: 126/87  Pulse: 82  Temp: 98.5 F (36.9 C)  Resp: 20    Intake/Output Summary (Last 24 hours) at 06/27/13 1049 Last data filed at 06/27/13 0853  Gross per 24 hour  Intake    483 ml  Output    950 ml  Net   -467 ml   Filed Weights   06/22/13 1734 06/23/13 0129 06/24/13 0500  Weight: 178.717 kg (394 lb) 178.6 kg (393 lb 11.9 oz) 180.1 kg (397 lb 0.8 oz)     Exam:   General:  AAOx3, no distress, morbidly obese  Cardiovascular: S1S2/RRR  Respiratory: CTAB  Abdomen: soft, obese, with large pannus with erythema/swelling at the bases  Musculoskeletal: no edema c/c , erythema and tenderness in L foot at MTP joint, only slightly better  Data Reviewed: Basic Metabolic Panel:  Recent Labs Lab 06/22/13 1903 06/23/13 0456 06/24/13 0927 06/25/13 0446 06/26/13 0616 06/27/13 0755  NA 142 145 143 141 141 140  K 5.7* 4.7 4.2 4.3 4.0 4.4  CL 104 111 110 107 105 104  CO2 23 22 22 23 27 24   GLUCOSE 117* 90 107* 109* 97 89  BUN 73* 63* 34* 25* 25* 32*  CREATININE 1.96* 1.74* 1.20 1.10 1.06 1.19  CALCIUM 11.0* 9.9 10.0 10.1 10.5 10.2  MG 2.4  --   --   --   --   --    Liver Function Tests: No results found for this basename: AST, ALT, ALKPHOS, BILITOT, PROT, ALBUMIN,  in the last 168 hours No results found for this basename: LIPASE, AMYLASE,  in the last 168 hours No results found for this basename: AMMONIA,  in the last 168 hours CBC:  Recent Labs Lab 06/22/13 1903 06/23/13 0456 06/25/13 0446  WBC 7.5 6.8 6.1  NEUTROABS 5.6  --   --   HGB 12.7* 11.2* 11.2*  HCT 39.5 34.5* 34.2*  MCV 94.7 95.8 93.4  PLT 314 215 210   Cardiac Enzymes:  Recent Labs Lab 06/22/13 1903  TROPONINI <0.30   BNP (  last 3 results) No results found for this basename: PROBNP,  in the last 8760 hours CBG: No results found for this basename: GLUCAP,  in the last 168 hours  Recent Results (from the past 240 hour(s))  MRSA PCR SCREENING     Status: None   Collection Time    06/23/13  1:00 AM      Result Value Ref Range Status   MRSA by PCR NEGATIVE  NEGATIVE Final   Comment:            The GeneXpert MRSA Assay (FDA     approved for NASAL specimens     only), is one component of a     comprehensive MRSA colonization     surveillance program. It is not     intended to diagnose MRSA     infection nor to guide or     monitor treatment for      MRSA infections.  URINE CULTURE     Status: None   Collection Time    06/23/13 10:00 AM      Result Value Ref Range Status   Specimen Description URINE, CLEAN CATCH   Final   Special Requests NONE   Final   Culture  Setup Time     Final   Value: 06/23/2013 14:29     Performed at [facility] Count     Final   Value: >=100,000 COLONIES/ML     Performed at [facility]   Culture     Final   Value: ESCHERICHIA COLI     Performed at [facility]   Report Status 06/25/2013 FINAL   Final   Organism ID, Bacteria ESCHERICHIA COLI   Final     Studies: No results found.  Scheduled Meds: . allopurinol  100 mg Oral Daily  . aspirin  325 mg Oral Daily  . ciprofloxacin  500 mg Oral BID  . colchicine  0.6 mg Oral BID  . enoxaparin (LOVENOX) injection  40 mg Subcutaneous Q24H  . metoprolol tartrate  25 mg Oral BID  . multivitamin with minerals  1 tablet Oral Daily  . pantoprazole  40 mg Oral Daily  . predniSONE  50 mg Oral Q breakfast  . simvastatin  10 mg Oral Daily  . sodium chloride  3 mL Intravenous Q12H   Continuous Infusions:   Antibiotics Given (last 72 hours)   Date/Time Action Medication Dose Rate   06/25/13 0409 Given   cefTRIAXone (ROCEPHIN) 1 g in dextrose 5 % 50 mL IVPB 1 g 100 mL/hr   06/26/13 0524 Given   cefTRIAXone (ROCEPHIN) 1 g in dextrose 5 % 50 mL IVPB 1 g 100 mL/hr   06/26/13 1151 Given  [pt denies any knowledge of allergy to medication, okay per MD]   ciprofloxacin (CIPRO) tablet 500 mg 500 mg    06/26/13 2028 Given   ciprofloxacin (CIPRO) tablet 500 mg 500 mg    06/27/13 1610 Given   ciprofloxacin (CIPRO) tablet 500 mg 500 mg       Principal Problem:   AKI (acute kidney injury) Active Problems:   Atrial fibrillation   Hypercalcemia   Hyperkalemia   Hypotension   Dehydration   Diastolic dysfunction   Gout   Urinary tract infection, site not specified    Time spent:    [facility]  [facility] Pager [phone]. If 7PM-7AM, please contact night-coverage at [email or web address], password [facility] 06/27/2013, 10:49 AM  LOS: 5 days

## 2013-06-28 LAB — BASIC METABOLIC PANEL
BUN: 31 mg/dL — ABNORMAL HIGH (ref 6–23)
CO2: 26 mEq/L (ref 19–32)
CREATININE: 1.03 mg/dL (ref 0.50–1.35)
Calcium: 10.6 mg/dL — ABNORMAL HIGH (ref 8.4–10.5)
Chloride: 103 mEq/L (ref 96–112)
GFR calc Af Amer: 89 mL/min — ABNORMAL LOW (ref >90)
GFR, EST NON AFRICAN AMERICAN: 76 mL/min — AB (ref >90)
GLUCOSE: 104 mg/dL — AB (ref 70–99)
Potassium: 4.3 mEq/L (ref 3.7–5.3)
SODIUM: 140 meq/L (ref 137–147)

## 2013-06-28 MED ORDER — ASPIRIN 325 MG PO TABS: 325.0000 mg | ORAL_TABLET | Freq: Every day | ORAL | Status: DC

## 2013-06-28 NOTE — Plan of Care (Signed)
Problem: Phase I Progression Outcomes Goal: Initial discharge plan identified Outcome: Completed/Met Date Met:  06/28/13 Home with Home Health at discharge

## 2013-06-28 NOTE — Progress Notes (Signed)
Pt refused 0600 vital signs as well as lab draws. Will make day shift nurse aware.

## 2013-06-28 NOTE — Progress Notes (Signed)
Nutrition Brief Note  Patient identified on the Malnutrition Screening Tool (MST) Report  Wt Readings from Last 15 Encounters:  06/24/13 397 lb 0.8 oz (180.1 kg)  05/31/11 511 lb 14.5 oz (232.2 kg)    Body mass index is 56.97 kg/(m^2). Patient meets criteria for extreme obesity, class III based on current BMI.   Current diet order is Heart Healthy, patient is consuming approximately 100% of meals at this time. Labs and medications reviewed.   No nutrition interventions warranted at this time. If nutrition issues arise, please consult RD.   Jossie Smoot A. Mayford KnifeWilliams, RD, LDN Pager: [phone]

## 2013-06-28 NOTE — Progress Notes (Signed)
Physical Therapy Treatment Patient Details Name: Frederick Brown MRN: [id number] DOB: Aug 06, 1950 Today's Date: 06/28/2013    History of Present Illness      PT Comments    Pt pleasant and eager to participate.  Pt initially hesitant to stand due to decreased mobility due to being in bed for length of time and pain Rt foot.  Pt able to stand independently with contact guard following words of reassurance and encouragement and cueing for hand placement for safety and assistance.  Pt able to stand for 1'-1'30" before requiring rest breaks due to weakness and decreased activity tolerance.  Pt able to demonstrate safe gait mechanics in small distances multiple attempts with no LOB episodes noted.  Pt left sitting on EOB with call bell within reach.  Follow Up Recommendations        Equipment Recommendations       Recommendations for Other Services       Precautions / Restrictions Precautions Precautions: Fall Restrictions Weight Bearing Restrictions: No    Mobility  Bed Mobility Overal bed mobility: Independent             General bed mobility comments: Pt completed supine to sit independently with several attempts made, verbal cueing and bed rails put up for handplacement assistance.  Transfers Overall transfer level: Modified independent Equipment used: Rolling walker (2 wheeled) (Rollator)             General transfer comment: 6 reps sit to stand independent with cueing for hand placement, pt able to stand 1-1'30" each rep, standing marching 15 reps alternating prior gait training  Ambulation/Gait   Ambulation Distance (Feet): 27 Feet (6 ft then rest break, 11 ft, rest break, 12 ft return to bed) Assistive device: Rolling walker (2 wheeled) (Rollator) Gait Pattern/deviations: Trunk flexed;Decreased stride length   Gait velocity interpretation: Below normal speed for age/gender General Gait Details: Pt able to demonstrate safe gait mechanics, very limited by  fatigue with sit to stands and gait and c/o pain Rt LE with gait   Stairs            Wheelchair Mobility    Modified Rankin (Stroke Patients Only)       Balance                                    Cognition Arousal/Alertness: Awake/alert Behavior During Therapy: WFL for tasks assessed/performed Overall Cognitive Status: Within Functional Limits for tasks assessed                      Exercises General Exercises - Lower Extremity Ankle Circles/Pumps: 10 reps;Both;Supine    General Comments        Home Living                      Prior Function            PT Goals (current goals can now be found in the care plan section) Progress towards PT goals: Progressing toward goals    Frequency       PT Plan Current plan remains appropriate    End of Session   Activity Tolerance: Patient limited by fatigue;Patient tolerated treatment well;Patient limited by pain Patient left: in bed;with call bell/phone within reach     Time: 0920-1015 PT Time Calculation (min): 55 min  Charges:  G Codes:      Juel Burrow 06/28/2013, 12:07 PM

## 2013-06-28 NOTE — Discharge Summary (Signed)
Physician Discharge Summary  Frederick Brown ZHY:[id number] DOB: 1950-06-02 DOA: 06/22/2013  PCP: [name], MD  Admit date: 06/22/2013 Discharge date: 06/28/2013  Time spent: 45 minutes  Recommendations for Outpatient Follow-up:  1. Bmet in 1 week 2. PCP in 1 week  Discharge Diagnoses:  Principal Problem:   AKI (acute kidney injury) Active Problems:   Atrial fibrillation   Hypercalcemia   Hyperkalemia   Hypotension   Dehydration   Diastolic dysfunction   Gout   Urinary tract infection, site not specified   Discharge Condition: stable  Diet recommendation: low sodium  Filed Weights   06/22/13 1734 06/23/13 0129 06/24/13 0500  Weight: 178.717 kg (394 lb) 178.6 kg (393 lb 11.9 oz) 180.1 kg (397 lb 0.8 oz)    History of present illness:  Frederick Brown is a 63 y.o. male with Multiple Medical problems who was recently discharged to home after a lengthy rehab stay and had been found by the Home health Nurse to be hypoxic with report of O2 sats in the 60's though he denied having any SOB, so he was sent urgently to the ED. In the ED, he was found to have O2 sats at 100%, however he was found on his laboratory studies to have an elevated BUN/Cr, and multiple Electrolyte abnormalities so he was referred for admission. He denies having any nausea vomiting or diarrhea or fevers or chills or chest pain. He does report having sharp pain in both of his feet and pain in his right great toe. He reports eating small amounts of food and not drinking very much fluid.   Hospital Course:  1. AKI on CKD -due to Diuretics, ACE, NSAIDs  -held above meds  -improved with IVF,  -now stable, off IVF   2. Hypotension  -not symptomatic, improved  -due to poor Po intake, diuretics  -random Cortisol normal  -increased lopressor back to home dose   3. UTI  -s/p 3days of Ceftriaxone, changed to PO Ciproflox for 2 more days  -Urine Cx with Ecoli >100K colonies, pansensitive   4. Chronic  diastolic CHF  -compensated  -diuretics were on hold  -started lasix 20mg  daily, HCTZ stopped due to gout   5. Chronic Afib  -rate controlled, resume low dose Metoprolol, ASA  -not felt to be an anticoagulation candidate based on prior notes  -defer to PCP   6. Gout FLare in feet  -uric acid 11.3,  Treated with Prednisone and COlchicine -also started on Allopurinol  -DC HCTZ at discharge  -improving  7. Pannus with skin breakdown at the inferior edges  -wound consult received  8. Hyperkalemia  -due to AKI, NSAIDs  -resolved, given kayexlate 3/24  -stopped ACE   9. Morbid Obesity with deconditioning  -Pt/Ot  -ambulate,  Home health PT at discharge   Discharge Exam: Filed Vitals:   06/28/13 1039  BP: 107/69  Pulse: 118  Temp:   Resp:     General: AAOx3, no distress Cardiovascular: S1S2/RRR Respiratory: CTAB  Discharge Instructions  Discharge Orders   Future Orders Complete By Expires   Diet - low sodium heart healthy  As directed    Increase activity slowly  As directed        Medication List    STOP taking these medications       lisinopril-hydrochlorothiazide 20-12.5 MG per tablet  Commonly known as:  PRINZIDE,ZESTORETIC     naproxen 500 MG tablet  Commonly known as:  NAPROSYN      TAKE  these medications       allopurinol 100 MG tablet  Commonly known as:  ZYLOPRIM  Take 1 tablet (100 mg total) by mouth daily.     aspirin 325 MG tablet  Take 1 tablet (325 mg total) by mouth daily.     furosemide 20 MG tablet  Commonly known as:  LASIX  Take 1 tablet (20 mg total) by mouth daily.     metoprolol tartrate 25 MG tablet  Commonly known as:  LOPRESSOR  Take 25 mg by mouth 2 (two) times daily.     multivitamin with minerals Tabs tablet  Take 1 tablet by mouth daily.     omeprazole 20 MG capsule  Commonly known as:  PRILOSEC  Take 20 mg by mouth daily.     oxyCODONE-acetaminophen 10-325 MG per tablet  Commonly known as:  PERCOCET  Take  1 tablet by mouth daily as needed. For pain. Normally taken at bedtime     predniSONE 20 MG tablet  Commonly known as:  DELTASONE  Take 1-2 tablets (20-40 mg total) by mouth daily with breakfast. Take 40mg  for 2days then 20mg  for 2days then STOP     simvastatin 10 MG tablet  Commonly known as:  ZOCOR  Take 10 mg by mouth daily.       Allergies  Allergen Reactions  . Ciprofloxacin Rash    Patient is not aware of the following allergy       Follow-up Information   [facility]. (they will call you)    Contact information:   [address] [place] [address] [phone]       Follow up with Kirk RuthsMCGOUGH,WILLIAM M, MD. Schedule an appointment as soon as possible for a visit in 1 week.   Specialty:  Family Medicine   Contact information:   [id number] [place] [address] [phone]        The results of significant diagnostics from this hospitalization (including imaging, microbiology, ancillary and laboratory) are listed below for reference.    Significant Diagnostic Studies: Dg Chest 2 View  06/22/2013   CLINICAL DATA:  Shortness of breath  EXAM: CHEST  2 VIEW  COMPARISON:  Prior radiograph from 05/28/2011  FINDINGS: Cardiomegaly is stable as compared to prior study. Mediastinal silhouette within normal limits.  The lungs are normally inflated. No airspace consolidation, pleural effusion, or pulmonary edema is identified. There is no pneumothorax.  No acute osseous abnormality identified. Multilevel degenerative changes noted within the visualized spine.  IMPRESSION: Stable cardiomegaly. No acute cardiopulmonary abnormality identified.   Electronically Signed   By: Rise MuBenjamin  McClintock M.D.   On: 06/22/2013 19:48    Microbiology: Recent Results (from the past 240 hour(s))  MRSA PCR SCREENING     Status: None   Collection Time    06/23/13  1:00 AM      Result Value Ref Range Status   MRSA by PCR NEGATIVE   NEGATIVE Final   Comment:            The GeneXpert MRSA Assay (FDA     approved for NASAL specimens     only), is one component of a     comprehensive MRSA colonization     surveillance program. It is not     intended to diagnose MRSA     infection nor to guide or     monitor treatment for     MRSA infections.  URINE  CULTURE     Status: None   Collection Time    06/23/13 10:00 AM      Result Value Ref Range Status   Specimen Description URINE, CLEAN CATCH   Final   Special Requests NONE   Final   Culture  Setup Time     Final   Value: 06/23/2013 14:29     Performed at [facility] Count     Final   Value: >=100,000 COLONIES/ML     Performed at [facility]   Culture     Final   Value: ESCHERICHIA COLI     Performed at [facility]   Report Status 06/25/2013 FINAL   Final   Organism ID, Bacteria ESCHERICHIA COLI   Final     Labs: Basic Metabolic Panel:  Recent Labs Lab 06/22/13 1903  06/24/13 0927 06/25/13 0446 06/26/13 0616 06/27/13 0755 06/28/13 0840  NA 142  < > 143 141 141 140 140  K 5.7*  < > 4.2 4.3 4.0 4.4 4.3  CL 104  < > 110 107 105 104 103  CO2 23  < > 22 23 27 24 26   GLUCOSE 117*  < > 107* 109* 97 89 104*  BUN 73*  < > 34* 25* 25* 32* 31*  CREATININE 1.96*  < > 1.20 1.10 1.06 1.19 1.03  CALCIUM 11.0*  < > 10.0 10.1 10.5 10.2 10.6*  MG 2.4  --   --   --   --   --   --   < > = values in this interval not displayed. Liver Function Tests: No results found for this basename: AST, ALT, ALKPHOS, BILITOT, PROT, ALBUMIN,  in the last 168 hours No results found for this basename: LIPASE, AMYLASE,  in the last 168 hours No results found for this basename: AMMONIA,  in the last 168 hours CBC:  Recent Labs Lab 06/22/13 1903 06/23/13 0456 06/25/13 0446  WBC 7.5 6.8 6.1  NEUTROABS 5.6  --   --   HGB 12.7* 11.2* 11.2*  HCT 39.5 34.5* 34.2*  MCV 94.7 95.8 93.4  PLT 314 215 210   Cardiac Enzymes:  Recent Labs Lab  06/22/13 1903  TROPONINI <0.30   BNP: BNP (last 3 results) No results found for this basename: PROBNP,  in the last 8760 hours CBG: No results found for this basename: GLUCAP,  in the last 168 hours     Signed:  Delta Pichon  [facility] 06/28/2013, 1:29 PM

## 2013-10-07 ENCOUNTER — Emergency Department (HOSPITAL_COMMUNITY): Payer: Medicare Other

## 2013-10-07 ENCOUNTER — Encounter (HOSPITAL_COMMUNITY): Payer: Self-pay | Admitting: Emergency Medicine

## 2013-10-07 ENCOUNTER — Inpatient Hospital Stay (HOSPITAL_COMMUNITY)
Admission: EM | Admit: 2013-10-07 | Discharge: 2013-10-10 | DRG: 445 | Disposition: A | Discharge status: Other Acute Inpatient Hospital | Payer: Medicare Other | Attending: Family Medicine | Admitting: Family Medicine

## 2013-10-07 DIAGNOSIS — E876 Hypokalemia: Secondary | ICD-10-CM | POA: Diagnosis present

## 2013-10-07 DIAGNOSIS — I5189 Other ill-defined heart diseases: Secondary | ICD-10-CM | POA: Diagnosis present

## 2013-10-07 DIAGNOSIS — I279 Pulmonary heart disease, unspecified: Secondary | ICD-10-CM | POA: Diagnosis present

## 2013-10-07 DIAGNOSIS — F1010 Alcohol abuse, uncomplicated: Secondary | ICD-10-CM

## 2013-10-07 DIAGNOSIS — M549 Dorsalgia, unspecified: Secondary | ICD-10-CM | POA: Diagnosis present

## 2013-10-07 DIAGNOSIS — K8050 Calculus of bile duct without cholangitis or cholecystitis without obstruction: Secondary | ICD-10-CM

## 2013-10-07 DIAGNOSIS — I2781 Cor pulmonale (chronic): Secondary | ICD-10-CM

## 2013-10-07 DIAGNOSIS — J9610 Chronic respiratory failure, unspecified whether with hypoxia or hypercapnia: Secondary | ICD-10-CM | POA: Diagnosis present

## 2013-10-07 DIAGNOSIS — N181 Chronic kidney disease, stage 1: Secondary | ICD-10-CM | POA: Diagnosis present

## 2013-10-07 DIAGNOSIS — K5900 Constipation, unspecified: Secondary | ICD-10-CM

## 2013-10-07 DIAGNOSIS — K439 Ventral hernia without obstruction or gangrene: Secondary | ICD-10-CM | POA: Diagnosis present

## 2013-10-07 DIAGNOSIS — I509 Heart failure, unspecified: Secondary | ICD-10-CM | POA: Diagnosis present

## 2013-10-07 DIAGNOSIS — K219 Gastro-esophageal reflux disease without esophagitis: Secondary | ICD-10-CM | POA: Diagnosis present

## 2013-10-07 DIAGNOSIS — Z79899 Other long term (current) drug therapy: Secondary | ICD-10-CM | POA: Diagnosis not present

## 2013-10-07 DIAGNOSIS — I959 Hypotension, unspecified: Secondary | ICD-10-CM | POA: Diagnosis present

## 2013-10-07 DIAGNOSIS — R1032 Left lower quadrant pain: Secondary | ICD-10-CM | POA: Diagnosis present

## 2013-10-07 DIAGNOSIS — E860 Dehydration: Secondary | ICD-10-CM | POA: Diagnosis present

## 2013-10-07 DIAGNOSIS — Z8249 Family history of ischemic heart disease and other diseases of the circulatory system: Secondary | ICD-10-CM | POA: Diagnosis not present

## 2013-10-07 DIAGNOSIS — G8929 Other chronic pain: Secondary | ICD-10-CM | POA: Diagnosis present

## 2013-10-07 DIAGNOSIS — E279 Disorder of adrenal gland, unspecified: Secondary | ICD-10-CM | POA: Diagnosis present

## 2013-10-07 DIAGNOSIS — I129 Hypertensive chronic kidney disease with stage 1 through stage 4 chronic kidney disease, or unspecified chronic kidney disease: Secondary | ICD-10-CM | POA: Diagnosis present

## 2013-10-07 DIAGNOSIS — M6281 Muscle weakness (generalized): Secondary | ICD-10-CM

## 2013-10-07 DIAGNOSIS — R17 Unspecified jaundice: Secondary | ICD-10-CM

## 2013-10-07 DIAGNOSIS — I4891 Unspecified atrial fibrillation: Secondary | ICD-10-CM | POA: Diagnosis present

## 2013-10-07 DIAGNOSIS — K8051 Calculus of bile duct without cholangitis or cholecystitis with obstruction: Secondary | ICD-10-CM | POA: Diagnosis present

## 2013-10-07 DIAGNOSIS — Z6841 Body Mass Index (BMI) 40.0 and over, adult: Secondary | ICD-10-CM | POA: Diagnosis not present

## 2013-10-07 DIAGNOSIS — Z7982 Long term (current) use of aspirin: Secondary | ICD-10-CM

## 2013-10-07 DIAGNOSIS — M1990 Unspecified osteoarthritis, unspecified site: Secondary | ICD-10-CM | POA: Diagnosis present

## 2013-10-07 DIAGNOSIS — Z823 Family history of stroke: Secondary | ICD-10-CM

## 2013-10-07 HISTORY — DX: Unspecified atrial fibrillation: I48.91

## 2013-10-07 LAB — CBC
HCT: 41.1 % (ref 39.0–52.0)
Hemoglobin: 14.8 g/dL (ref 13.0–17.0)
MCH: 30.7 pg (ref 26.0–34.0)
MCHC: 36 g/dL (ref 30.0–36.0)
MCV: 85.3 fL (ref 78.0–100.0)
PLATELETS: 233 10*3/uL (ref 150–400)
RBC: 4.82 MIL/uL (ref 4.22–5.81)
RDW: 14.8 % (ref 11.5–15.5)
WBC: 6.4 10*3/uL (ref 4.0–10.5)

## 2013-10-07 LAB — HEPATIC FUNCTION PANEL
ALT: 389 U/L — ABNORMAL HIGH (ref 0–53)
AST: 265 U/L — AB (ref 0–37)
Albumin: 3.4 g/dL — ABNORMAL LOW (ref 3.5–5.2)
Alkaline Phosphatase: 621 U/L — ABNORMAL HIGH (ref 39–117)
BILIRUBIN DIRECT: 9 mg/dL — AB (ref 0.0–0.3)
BILIRUBIN INDIRECT: 2 mg/dL — AB (ref 0.3–0.9)
BILIRUBIN TOTAL: 11 mg/dL — AB (ref 0.3–1.2)
Total Protein: 7.1 g/dL (ref 6.0–8.3)

## 2013-10-07 LAB — URINALYSIS, ROUTINE W REFLEX MICROSCOPIC
Glucose, UA: 100 mg/dL — AB
Hgb urine dipstick: NEGATIVE
KETONES UR: 15 mg/dL — AB
LEUKOCYTES UA: NEGATIVE
Nitrite: NEGATIVE
PH: 5.5 (ref 5.0–8.0)
Protein, ur: NEGATIVE mg/dL
Specific Gravity, Urine: 1.015 (ref 1.005–1.030)
UROBILINOGEN UA: 4 mg/dL — AB (ref 0.0–1.0)

## 2013-10-07 LAB — BASIC METABOLIC PANEL
ANION GAP: 19 — AB (ref 5–15)
BUN: 35 mg/dL — ABNORMAL HIGH (ref 6–23)
CO2: 26 meq/L (ref 19–32)
CREATININE: 1.42 mg/dL — AB (ref 0.50–1.35)
Calcium: 11 mg/dL — ABNORMAL HIGH (ref 8.4–10.5)
Chloride: 87 mEq/L — ABNORMAL LOW (ref 96–112)
GFR, EST AFRICAN AMERICAN: 60 mL/min — AB (ref >90)
GFR, EST NON AFRICAN AMERICAN: 51 mL/min — AB (ref >90)
Glucose, Bld: 122 mg/dL — ABNORMAL HIGH (ref 70–99)
Potassium: 2.5 mEq/L — CL (ref 3.7–5.3)
SODIUM: 132 meq/L — AB (ref 137–147)

## 2013-10-07 LAB — PROTIME-INR
INR: 0.99 (ref 0.00–1.49)
Prothrombin Time: 13.1 seconds (ref 11.6–15.2)

## 2013-10-07 LAB — LIPASE, BLOOD: LIPASE: 35 U/L (ref 11–59)

## 2013-10-07 LAB — MAGNESIUM: Magnesium: 1.6 mg/dL (ref 1.5–2.5)

## 2013-10-07 MED ORDER — SODIUM CHLORIDE 0.9 % IV BOLUS (SEPSIS)
1000.0000 mL | Freq: Once | INTRAVENOUS | Status: AC
  Administered 2013-10-07: 1000 mL via INTRAVENOUS

## 2013-10-07 MED ORDER — DEXTROSE 5 % IV SOLN: 1.0000 g | INTRAVENOUS | Status: DC

## 2013-10-07 MED ORDER — PANTOPRAZOLE SODIUM 40 MG PO TBEC
40.0000 mg | DELAYED_RELEASE_TABLET | Freq: Every day | ORAL | Status: DC
  Administered 2013-10-07 – 2013-10-10 (×4): 40 mg via ORAL
  Filled 2013-10-07 (×4): qty 1

## 2013-10-07 MED ORDER — OXYCODONE-ACETAMINOPHEN 5-325 MG PO TABS
1.0000 | ORAL_TABLET | Freq: Every day | ORAL | Status: DC | PRN
  Administered 2013-10-07 – 2013-10-08 (×2): 1 via ORAL
  Filled 2013-10-07 (×2): qty 1

## 2013-10-07 MED ORDER — ONDANSETRON HCL 4 MG PO TABS: 4.0000 mg | ORAL_TABLET | Freq: Four times a day (QID) | ORAL | Status: DC | PRN

## 2013-10-07 MED ORDER — POTASSIUM CHLORIDE 10 MEQ/100ML IV SOLN: 10.0000 meq | INTRAVENOUS | Status: DC

## 2013-10-07 MED ORDER — OXYCODONE-ACETAMINOPHEN 10-325 MG PO TABS: 1.0000 | ORAL_TABLET | Freq: Every day | ORAL | Status: DC | PRN

## 2013-10-07 MED ORDER — IOHEXOL 300 MG/ML  SOLN
100.0000 mL | Freq: Once | INTRAMUSCULAR | Status: AC | PRN
  Administered 2013-10-07: 100 mL via INTRAVENOUS

## 2013-10-07 MED ORDER — ALLOPURINOL 100 MG PO TABS
100.0000 mg | ORAL_TABLET | Freq: Every day | ORAL | Status: DC
  Administered 2013-10-07 – 2013-10-10 (×4): 100 mg via ORAL
  Filled 2013-10-07 (×4): qty 1

## 2013-10-07 MED ORDER — METOLAZONE 2.5 MG PO TABS
2.5000 mg | ORAL_TABLET | Freq: Every day | ORAL | Status: DC
  Administered 2013-10-07 – 2013-10-09 (×3): 2.5 mg via ORAL
  Filled 2013-10-07 (×3): qty 1

## 2013-10-07 MED ORDER — ENOXAPARIN SODIUM 40 MG/0.4ML ~~LOC~~ SOLN: 40.0000 mg | SUBCUTANEOUS | Status: DC

## 2013-10-07 MED ORDER — POTASSIUM CHLORIDE CRYS ER 20 MEQ PO TBCR: 40.0000 meq | EXTENDED_RELEASE_TABLET | Freq: Once | ORAL | Status: DC

## 2013-10-07 MED ORDER — OXYCODONE HCL 5 MG PO TABS
5.0000 mg | ORAL_TABLET | Freq: Every day | ORAL | Status: DC | PRN
  Administered 2013-10-07 – 2013-10-08 (×2): 5 mg via ORAL
  Filled 2013-10-07 (×2): qty 1

## 2013-10-07 MED ORDER — POTASSIUM CHLORIDE CRYS ER 20 MEQ PO TBCR
40.0000 meq | EXTENDED_RELEASE_TABLET | Freq: Once | ORAL | Status: AC
  Administered 2013-10-07: 40 meq via ORAL
  Filled 2013-10-07: qty 2

## 2013-10-07 MED ORDER — DEXTROSE 5 % IV SOLN
1.0000 g | INTRAVENOUS | Status: DC
  Administered 2013-10-07 – 2013-10-09 (×3): 1 g via INTRAVENOUS
  Filled 2013-10-07 (×4): qty 10

## 2013-10-07 MED ORDER — POTASSIUM CHLORIDE 10 MEQ/100ML IV SOLN
10.0000 meq | INTRAVENOUS | Status: AC
  Administered 2013-10-07 – 2013-10-08 (×6): 10 meq via INTRAVENOUS
  Filled 2013-10-07 (×4): qty 100

## 2013-10-07 MED ORDER — POTASSIUM CHLORIDE IN NACL 40-0.9 MEQ/L-% IV SOLN
INTRAVENOUS | Status: DC
  Administered 2013-10-07 – 2013-10-09 (×3): 100 mL/h via INTRAVENOUS

## 2013-10-07 MED ORDER — FUROSEMIDE 20 MG PO TABS
20.0000 mg | ORAL_TABLET | Freq: Every day | ORAL | Status: DC
  Administered 2013-10-07 – 2013-10-10 (×4): 20 mg via ORAL
  Filled 2013-10-07 (×4): qty 1

## 2013-10-07 MED ORDER — DEXTROSE 5 % IV SOLN
INTRAVENOUS | Status: AC
  Filled 2013-10-07: qty 10

## 2013-10-07 MED ORDER — METOPROLOL TARTRATE 25 MG PO TABS
25.0000 mg | ORAL_TABLET | Freq: Two times a day (BID) | ORAL | Status: DC
  Administered 2013-10-07 – 2013-10-10 (×6): 25 mg via ORAL
  Filled 2013-10-07 (×6): qty 1

## 2013-10-07 MED ORDER — ONDANSETRON HCL 4 MG/2ML IJ SOLN: 4.0000 mg | Freq: Four times a day (QID) | INTRAMUSCULAR | Status: DC | PRN

## 2013-10-07 NOTE — ED Notes (Signed)
Dr. Rehman at bedside. 

## 2013-10-07 NOTE — Consult Note (Signed)
Referring Provider: No ref. provider found Primary Care Physician:  Kirk RuthsMCGOUGH,WILLIAM M, MD Primary Gastroenterologist:  Dr. Karilyn Cotaehman  Reason for Consultation:    Choledocholithiasis  HPI:   Patient is 63 year old Caucasian male with multiple medical problems who presented to the emergency room with complaints of profound weakness anorexia abdominal pain and constipation. Patient states he has not felt well for at least one month. He believes he had food poisoning to begin with. He has been experiencing upper and lower abdominal pain. He states most of his abdominal pain has been in left lower quadrant. 2 or 3 days ago he is noted to his skin color to be yellow. He also has had quadrant colored urine for several days. He states he has lost close to 50 pounds in the last 2-1/2 months. He has been nauseated but has not had any vomiting. He also has been constipated last bowel movement was 4 days ago. He denies melena or rectal bleeding. Patient was evaluated by Dr. Estell HarpinZammit. He was found to have hypokalemia elevated serum calcium he was also noted to be jaundiced with improvement of 11 mg an abdominopelvic CT revealed dilated biliary system with single large stone and a few small stones. He was also noted to have bilateral adrenal masses. Right adrenal lesion felt to be compatible with adenoma and lesion in left adrenal gland did not meet criteria for adenoma. Patient denies fever chills shortness of breath or chest pain. He states he does not drink alcohol daily He lives alone. He's been married twice and divorced. He does not have any children. He smokes cigarettes for 15 years or so and quit in 1983. 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  Past Medical History  Diagnosis Date  . Hypertension   . Obesity, morbid (more than 100 lbs over ideal weight or BMI > 40)   . Degenerative joint disease   . Alcohol abuse   . Umbilical hernia   . Gastroesophageal reflux disease   . Vitamin B12  deficiency 05/22/2011  . Fracture of metatarsal of left foot, closed 05/28/2011  . Fracture of great toe, left, closed 05/28/2011  . Chronic respiratory failure with hypoxia 05/28/2011  . Urticarial rash 05/31/2011    From Cipro  . Cor pulmonale, chronic February 2013  . Obesity hypoventilation syndrome February 2013  . Pickwickian syndrome   . CHF (congestive heart failure)   . A-fib   History of colonic polyps. Last colonoscopy was in August 2009 minutes 3 tubular adenomas removed by Dr. Jena Gaussourk.  Past Surgical History  Procedure Laterality Date  . Cholecystectomy    . Knee surgery      Prior to Admission medications   Medication Sig Start Date End Date Taking? Authorizing Provider  acetaminophen (TYLENOL) 500 MG tablet Take 1,000 mg by mouth every 6 (six) hours as needed for mild pain.   Yes Historical Provider, MD  allopurinol (ZYLOPRIM) 100 MG tablet Take 1 tablet (100 mg total) by mouth daily. 06/26/13  Yes Zannie CovePreetha [name], MD  aspirin 325 MG tablet Take 1 tablet (325 mg total) by mouth daily. 06/28/13  Yes Zannie CovePreetha [name], MD  furosemide (LASIX) 20 MG tablet Take 1 tablet (20 mg total) by mouth daily. 06/26/13  Yes Zannie CovePreetha [name], MD  metolazone (ZAROXOLYN) 2.5 MG tablet Take 2.5 mg by mouth daily.   Yes Historical Provider, MD  metoprolol tartrate (LOPRESSOR) 25 MG tablet Take 25 mg by mouth 2 (two) times daily.   Yes Historical Provider,  MD  Multiple Vitamin (MULITIVITAMIN WITH MINERALS) TABS Take 1 tablet by mouth daily. 05/31/11  Yes Elliot Cousin, MD  omeprazole (PRILOSEC) 20 MG capsule Take 20 mg by mouth daily.   Yes Historical Provider, MD  oxyCODONE-acetaminophen (PERCOCET) 10-325 MG per tablet Take 1 tablet by mouth daily as needed. For pain. Normally taken at bedtime 05/05/13  Yes Historical Provider, MD  simvastatin (ZOCOR) 10 MG tablet Take 10 mg by mouth daily.   Yes Historical Provider, MD    Current Facility-Administered Medications  Medication Dose Route Frequency Provider  Last Rate Last Dose  . 0.9 % NaCl with KCl 40 mEq / L  infusion   Intravenous Continuous Nimish Normajean Glasgow, MD      . allopurinol (ZYLOPRIM) tablet 100 mg  100 mg Oral Daily Nimish C Gosrani, MD      . cefTRIAXone (ROCEPHIN) 1 g in dextrose 5 % 50 mL IVPB  1 g Intravenous Q24H Malissa Hippo, MD      . furosemide (LASIX) tablet 20 mg  20 mg Oral Daily Nimish C Gosrani, MD      . metolazone (ZAROXOLYN) tablet 2.5 mg  2.5 mg Oral Daily Nimish C Gosrani, MD      . metoprolol tartrate (LOPRESSOR) tablet 25 mg  25 mg Oral BID Nimish C Gosrani, MD      . ondansetron (ZOFRAN) tablet 4 mg  4 mg Oral Q6H PRN Nimish Normajean Glasgow, MD       Or  . ondansetron (ZOFRAN) injection 4 mg  4 mg Intravenous Q6H PRN Nimish C Gosrani, MD      . oxyCODONE-acetaminophen (PERCOCET) 10-325 MG per tablet 1 tablet  1 tablet Oral Daily PRN Nimish C Gosrani, MD      . pantoprazole (PROTONIX) EC tablet 40 mg  40 mg Oral Daily Nimish C Gosrani, MD      . potassium chloride 10 mEq in 100 mL IVPB  10 mEq Intravenous Q1 Hr x 6 Malissa Hippo, MD       Current Outpatient Prescriptions  Medication Sig Dispense Refill  . acetaminophen (TYLENOL) 500 MG tablet Take 1,000 mg by mouth every 6 (six) hours as needed for mild pain.      Marland Kitchen allopurinol (ZYLOPRIM) 100 MG tablet Take 1 tablet (100 mg total) by mouth daily.  30 tablet  0  . aspirin 325 MG tablet Take 1 tablet (325 mg total) by mouth daily.      . furosemide (LASIX) 20 MG tablet Take 1 tablet (20 mg total) by mouth daily.  30 tablet  0  . metolazone (ZAROXOLYN) 2.5 MG tablet Take 2.5 mg by mouth daily.      . metoprolol tartrate (LOPRESSOR) 25 MG tablet Take 25 mg by mouth 2 (two) times daily.      . Multiple Vitamin (MULITIVITAMIN WITH MINERALS) TABS Take 1 tablet by mouth daily.      Marland Kitchen omeprazole (PRILOSEC) 20 MG capsule Take 20 mg by mouth daily.      Marland Kitchen oxyCODONE-acetaminophen (PERCOCET) 10-325 MG per tablet Take 1 tablet by mouth daily as needed. For pain. Normally taken at  bedtime      . simvastatin (ZOCOR) 10 MG tablet Take 10 mg by mouth daily.        Allergies as of 10/07/2013 - Review Complete 10/07/2013  Allergen Reaction Noted  . Ciprofloxacin Rash 05/31/2011    Family History  Problem Relation Age of Onset  . Heart attack Father   .  Heart failure Father   . Stroke Father   . Hypertension Father     History   Social History  . Marital Status: Divorced    Spouse Name: N/A    Number of Children: N/A  . Years of Education: N/A   Occupational History  . Not on file.   Social History Main Topics  . Smoking status: Never Smoker   . Smokeless tobacco: Not on file  . Alcohol Use: 0.6 oz/week    1 Shots of liquor per week     Comment: occ  . Drug Use: No  . Sexual Activity: Not Currently   Other Topics Concern  . Not on file   Social History Narrative  . No narrative on file    Review of Systems: See HPI, otherwise normal ROS  Physical Exam: Temp:  [98 F (36.7 C)] 98 F (36.7 C) (07/09 1121) Pulse Rate:  [42-129] 129 (07/09 1700) Resp:  [20-23] 21 (07/09 1330) BP: (106-129)/(73-118) 128/86 mmHg (07/09 1700) SpO2:  [91 %-99 %] 98 % (07/09 1700) Weight:  [347 lb 7.5 oz (157.611 kg)-395 lb (179.171 kg)] 347 lb 7.5 oz (157.611 kg) (07/09 1345)  Well-developed obese Caucasian male with pronounced central obesity. He is in no acute distress. Patient is alert and does not have asterixis. Conjunctiva is pink. Sclera is markedly icteric. Oropharyngeal mucosa is dry otherwise unremarkable. No neck masses or thyromegaly noted. Cardiac exam with irregular rhythm normal S1 and S2. No murmur or gallop noted. Auscultation of lungs reveals vesicular breath sounds bilaterally. Abdomen is very large in flabby. Unlikely hernias noted the skin discoloration but is completely reducible. Abdomen is soft with mild generalized tenderness. Liver edge easily palpable 4-5 cm below RCM. Is firm and tender. Spleen is not palpable. No peripheral edema  or clubbing noted. He has conical bony protuberance at right shin.      Lab Results:  Recent Labs  10/07/13 1133  WBC 6.4  HGB 14.8  HCT 41.1  PLT 233   BMET  Recent Labs  10/07/13 1133  NA 132*  K 2.5*  CL 87*  CO2 26  GLUCOSE 122*  BUN 35*  CREATININE 1.42*  CALCIUM 11.0*   LFT  Recent Labs  10/07/13 1133  PROT 7.1  ALBUMIN 3.4*  AST 265*  ALT 389*  ALKPHOS 621*  BILITOT 11.0*  BILIDIR 9.0*  IBILI 2.0*   PT/INR  Recent Labs  10/07/13 1130  LABPROT 13.1  INR 0.99   Hepatitis Panel No results found for this basename: HEPBSAG, HCVAB, HEPAIGM, HEPBIGM,  in the last 72 hours  Studies/Results: Dg Chest 2 View  10/07/2013   CLINICAL DATA:  Weakness and dizziness.  EXAM: CHEST  2 VIEW  COMPARISON:  06/22/2013.  FINDINGS: Mediastinum and hilar structures normal. Cardiomegaly with normal pulmonary vascularity. Lungs are clear. Degenerative changes thoracic spine.  IMPRESSION: 1. Cardiomegaly. 2. No acute cardiopulmonary disease.   Electronically Signed   By: Maisie Fus  Register   On: 10/07/2013 12:54   Ct Abdomen Pelvis W Contrast  10/07/2013   CLINICAL DATA:  Jaundice.  Weakness with dizziness.  Constipation.  EXAM: CT ABDOMEN AND PELVIS WITH CONTRAST  TECHNIQUE: Multidetector CT imaging of the abdomen and pelvis was performed using the standard protocol following bolus administration of intravenous contrast.  CONTRAST:  OMNIPAQUE IOHEXOL 300 MG/ML  SOLN  COMPARISON:  None.  FINDINGS: Lung Bases: Unremarkable.  Liver:  No focal intraparenchymal mass lesion.  Spleen: Normal uninfused appearance.  Stomach: Normal.  Pancreas: No dilatation of the main pancreatic duct.  Gallbladder/Biliary: Gallbladder is surgically absent. There is intrahepatic biliary duct dilatation. Extrahepatic biliary ducts are dilated. Coronal reformations suggests that there may be a late joining of the left and right ducts in this patient although this may be related to secondary dilatation of  the cystic duct remnant. In the head of the pancreas, common bile duct measures up to 18 mm in diameter. In the distal common bile duct, a 13 mm calcified stone is identified (see image 40 of series 3 and image 44 of series 4. There also appears to be other dependent tiny stones in the distal common duct (image 36 of series 3 and image 47 of series 4).  Kidneys/Adrenals: 3.4 cm right adrenal mass has a average attenuation as low as 3 Hounsfield units, consistent with benign adrenal adenoma. There is a lobular left adrenal mass which measures up to 4.7 cm in diameter. This has some regions that are low enough in attenuation to suggest adenoma, but other regions which measures slightly higher (22 Hounsfield units). Kidneys are normal in appearance bilaterally.  Bowel Loops: The duodenum is normally positioned as is the ligament of Treitz. There is no evidence for small bowel obstruction. Terminal ileum and appendix are normal. Colon is decompressed. No colonic diverticulitis.  Nodes: No lymphadenopathy in the abdomen. No pelvic sidewall lymphadenopathy.  Vasculature: Atherosclerotic calcification is noted in the wall of the abdominal aorta without aneurysm.  Pelvic Genitourinary: Bladder is normal for the degree of distention. Prostate gland is unenlarged.  Bones/Musculoskeletal: Bone windows reveal no worrisome lytic or sclerotic osseous lesions.  Body Wall: A ventral hernia is identified, containing only fat. Although this projects to the left of the midline on axial imaging, it appears to arise at the midline of the rectus sheath and I think it is simply the laxity of the fascia which creates this leftward position. The hernia sac measures 6.3 x 11.2 x 6.5 cm. There is a tiny amount of fluid within the hernia.  Other: No fluid in the anatomic pelvis.  IMPRESSION: 1. Intra and extrahepatic biliary duct dilatation secondary to a 13 mm stone in the distal common bile duct. Other tiny (at least 2) calcified stones are  seen dependently in common bile duct, proximal to the obstructing large stone. 2. Bilateral adrenal masses. The right adrenal lesion is compatible with an adenoma. Left adrenal mass is also very low-attenuation and although it cannot be definitively characterize as an adenoma on this exam, that is the likely etiology. 3. Ventral hernia contains only fat.   Electronically Signed   By: Kennith Center M.D.   On: 10/07/2013 15:22    Assessment; Patient is 63 year old Caucasian male with multiple medical problems who presents with abdominal pain anorexia and jaundice and on workup found to have choledocholithiasis. He drinks alcohol daily usually 2-3 drinks; INR and platelet count is normal which is reassuring; difficult to determine if he has an element of alcoholic hepatitis given bile duct obstruction. He could also have low grade cholangitis; TBC is normal and he is afebrile to Patient will need therapeutic ERCP which is high risk. He will need to be cleared by anesthesia for procedure scheduled. In the meantime need to treat profound hypokalemia. Patient also has elevated serum calcium in it is most likely secondary to dehydration.  Recommendations; Will check serum magnesium level on admission blood. KCL 10 mEq IV every hour x6 doses. Ceftriaxone 1 g IV every 24 hours. Hold Lovenox  for now and use Flowtron's for DVT prophylaxis. ERCP with stone extraction possibly in a.m. if electrolyte abnormalities have been corrected and he is cleared by anesthesia.   LOS: 0 days   Varie Machamer U  10/07/2013, 5:38 PM

## 2013-10-07 NOTE — ED Provider Notes (Signed)
CSN: [id number]     Arrival date & time 10/07/13  1113 History  This chart was scribed for Benny LennertJoseph L Ilyaas Musto, MD by Ardelia Memsylan Malpass, ED Scribe. This patient was seen in room [facility]14[facility] and the patient's care was started at 1:15 PM.   Chief Complaint  Patient presents with  . Weakness    Patient is a 63 y.o. male presenting with weakness. The history is provided by the patient. No language interpreter was used.  Weakness This is a new problem. The current episode started 2 days ago. The problem occurs rarely. The problem has not changed since onset.Pertinent negatives include no chest pain, no abdominal pain and no headaches. Nothing aggravates the symptoms. Nothing relieves the symptoms. He has tried nothing for the symptoms.    HPI Comments: Frederick Brown is a 63 y.o. male with a history of HTN, CHF and Afib who presents to the Emergency Department complaining of generalized weakness over the past few days. He states that he feels "run down". He reports associated constipation with no bowel movement over the past 4 days. He also states that his urine has been darker than usual over the past few days. He states that he has been belching frequently and his stomach feels "full", but he has only had 4 peanut butter crackers all day. He states that he had an episode of similar symptoms in April 2015. He states that he is not on any anticoagulation medications.   Past Medical History  Diagnosis Date  . Hypertension   . Obesity, morbid (more than 100 lbs over ideal weight or BMI > 40)   . Degenerative joint disease   . Alcohol abuse   . Umbilical hernia   . Gastroesophageal reflux disease   . Vitamin B12 deficiency 05/22/2011  . Fracture of metatarsal of left foot, closed 05/28/2011  . Fracture of great toe, left, closed 05/28/2011  . Chronic respiratory failure with hypoxia 05/28/2011  . Urticarial rash 05/31/2011    From Cipro  . Cor pulmonale, chronic February 2013  . Obesity hypoventilation  syndrome February 2013  . Pickwickian syndrome   . CHF (congestive heart failure)   . A-fib    Past Surgical History  Procedure Laterality Date  . Cholecystectomy    . Knee surgery     Family History  Problem Relation Age of Onset  . Heart attack Father   . Heart failure Father   . Stroke Father   . Hypertension Father    History  Substance Use Topics  . Smoking status: Never Smoker   . Smokeless tobacco: Not on file  . Alcohol Use: 0.6 oz/week    1 Shots of liquor per week     Comment: occ    Review of Systems  Constitutional: Positive for fatigue. Negative for appetite change.  HENT: Negative for congestion, ear discharge and sinus pressure.   Eyes: Negative for discharge.  Respiratory: Negative for cough.   Cardiovascular: Negative for chest pain.  Gastrointestinal: Positive for constipation. Negative for abdominal pain and diarrhea.  Genitourinary: Negative for frequency and hematuria.  Musculoskeletal: Negative for back pain.  Skin: Negative for rash.  Neurological: Positive for weakness. Negative for seizures and headaches.  Psychiatric/Behavioral: Negative for hallucinations.    Allergies  Ciprofloxacin  Home Medications   Prior to Admission medications   Medication Sig Start Date End Date Taking? Authorizing Provider  acetaminophen (TYLENOL) 500 MG tablet Take 1,000 mg by mouth every 6 (six) hours as needed for mild  pain.   Yes Historical Provider, MD  allopurinol (ZYLOPRIM) 100 MG tablet Take 1 tablet (100 mg total) by mouth daily. 06/26/13  Yes Zannie Cove, MD  aspirin 325 MG tablet Take 1 tablet (325 mg total) by mouth daily. 06/28/13  Yes Zannie Cove, MD  furosemide (LASIX) 20 MG tablet Take 1 tablet (20 mg total) by mouth daily. 06/26/13  Yes Zannie Cove, MD  metolazone (ZAROXOLYN) 2.5 MG tablet Take 2.5 mg by mouth daily.   Yes Historical Provider, MD  metoprolol tartrate (LOPRESSOR) 25 MG tablet Take 25 mg by mouth 2 (two) times daily.   Yes  Historical Provider, MD  Multiple Vitamin (MULITIVITAMIN WITH MINERALS) TABS Take 1 tablet by mouth daily. 05/31/11  Yes Elliot Cousin, MD  omeprazole (PRILOSEC) 20 MG capsule Take 20 mg by mouth daily.   Yes Historical Provider, MD  oxyCODONE-acetaminophen (PERCOCET) 10-325 MG per tablet Take 1 tablet by mouth daily as needed. For pain. Normally taken at bedtime 05/05/13  Yes Historical Provider, MD  simvastatin (ZOCOR) 10 MG tablet Take 10 mg by mouth daily.   Yes Historical Provider, MD   Triage Vitals: BP 123/73  Pulse 79  Temp(Src) 98 F (36.7 C)  Resp 22  Ht 5\' 10"  (1.778 m)  Wt 395 lb (179.171 kg)  BMI 56.68 kg/m2  SpO2 99%  Physical Exam  Nursing note and vitals reviewed. Constitutional: He is oriented to person, place, and time. He appears well-developed.  Obese  HENT:  Head: Normocephalic.  Dry mucous membranes  Eyes: Conjunctivae and EOM are normal.  Jaundice  Neck: Neck supple. No thyromegaly present.  Cardiovascular: Exam reveals no gallop and no friction rub.   No murmur heard. Tachycardic, irregular rhythm  Pulmonary/Chest: No stridor. He has no wheezes. He has no rales. He exhibits no tenderness.  Abdominal: He exhibits no distension. There is tenderness. There is no rebound.  Mildly tender throughout abdomen  Musculoskeletal: Normal range of motion. He exhibits no edema.  Lymphadenopathy:    He has no cervical adenopathy.  Neurological: He is oriented to person, place, and time. He exhibits normal muscle tone. Coordination normal.  Skin: No rash noted. No erythema.  Psychiatric: He has a normal mood and affect. His behavior is normal.    ED Course  Procedures (including critical care time)  DIAGNOSTIC STUDIES: Oxygen Saturation is 99% on RA, normal by my interpretation.    COORDINATION OF CARE: 1:24 PM- Pt advised of plan for treatment and pt agrees.  Results for orders placed during the hospital encounter of 10/07/13  CBC      Result Value Ref Range    WBC 6.4  4.0 - 10.5 K/uL   RBC 4.82  4.22 - 5.81 MIL/uL   Hemoglobin 14.8  13.0 - 17.0 g/dL   HCT 16.1  09.6 - 04.5 %   MCV 85.3  78.0 - 100.0 fL   MCH 30.7  26.0 - 34.0 pg   MCHC 36.0  30.0 - 36.0 g/dL   RDW 40.9  81.1 - 91.4 %   Platelets 233  150 - 400 K/uL  BASIC METABOLIC PANEL      Result Value Ref Range   Sodium 132 (*) 137 - 147 mEq/L   Potassium 2.5 (*) 3.7 - 5.3 mEq/L   Chloride 87 (*) 96 - 112 mEq/L   CO2 26  19 - 32 mEq/L   Glucose, Bld 122 (*) 70 - 99 mg/dL   BUN 35 (*) 6 - 23 mg/dL  Creatinine, Ser 1.42 (*) 0.50 - 1.35 mg/dL   Calcium 16.1 (*) 8.4 - 10.5 mg/dL   GFR calc non Af Amer 51 (*) >90 mL/min   GFR calc Af Amer 60 (*) >90 mL/min   Anion gap 19 (*) 5 - 15   Dg Chest 2 View  10/07/2013   CLINICAL DATA:  Weakness and dizziness.  EXAM: CHEST  2 VIEW  COMPARISON:  06/22/2013.  FINDINGS: Mediastinum and hilar structures normal. Cardiomegaly with normal pulmonary vascularity. Lungs are clear. Degenerative changes thoracic spine.  IMPRESSION: 1. Cardiomegaly. 2. No acute cardiopulmonary disease.   Electronically Signed   By: Maisie Fus  Register   On: 10/07/2013 12:54     EKG Interpretation   Date/Time:  Thursday October 07 2013 11:25:12 EDT Ventricular Rate:  127 PR Interval:    QRS Duration: 110 QT Interval:  355 QTC Calculation: 516 R Axis:   -44 Text Interpretation:  Atrial fibrillation Ventricular premature complex  Abnormal R-wave progression, early transition Probable inferior infarct,  age indeterminate Lateral leads are also involved Prolonged QT interval  Confirmed by Anwyn Kriegel  MD, Matthias Bogus [phone]) on 10/07/2013 4:34:13 PM      MDM   Final diagnoses:  None   Jaundice,  Admit The chart was scribed for me under my direct supervision.  I personally performed the history, physical, and medical decision making and all procedures in the evaluation of this patient.Benny Lennert, MD 10/07/13 [phone]

## 2013-10-07 NOTE — ED Notes (Signed)
Attempted report to floor, RN to call back

## 2013-10-07 NOTE — ED Notes (Addendum)
C/o  General overall weakness and dizziness, also reports constipation and bloated feeling, yellowing of skin and eyes noted

## 2013-10-07 NOTE — ED Notes (Addendum)
CRITICAL VALUE ALERT  Critical value received: K+ Potassium  Date of notification:  10/07/13  Time of notification:  1205  Critical value read back:Yes.    Nurse who received alert:  Flint MelterSharley Hadassa Cermak RN  MD notified (1st page):   Time of second page:  Responding MD:  Dr. Adriana Simasook  Time MD responded:  1207

## 2013-10-07 NOTE — H&P (Addendum)
[facility] History and Physical  Frederick Brown:[id number] DOB: 12/06/1950 DOA: 10/07/2013  Referring physician: ER. PCP: [name], MD   Chief Complaint: Weakness and generalized abdominal pain.  HPI: Frederick Brown is a 63 y.o. male  This is a 63 year old morbidly obese man who presents with 2 to three-day history of generalized weakness. He says he has not opened his bowels for the last 4 days. He is also nonspecific abdominal discomfort associated with dark urine. Further investigation in the emergency room showed that he had hyperbilirubinemia and CT scan shows the presence of a 13 mm stone in the common bile duct causing an obstructive jaundice picture. He is now being admitted for further management.   Review of Systems:  Constitutional:  No weight loss, night sweats, Fevers, chills  HEENT:  No headaches, Difficulty swallowing,Tooth/dental problems,Sore throat,  No sneezing, itching, ear ache, nasal congestion, post nasal drip,  Cardio-vascular:  No chest pain, Orthopnea, PND, swelling in lower extremities, anasarca, dizziness, palpitations   Resp:  No shortness of breath with exertion or at rest. No excess mucus, no productive cough, No non-productive cough, No coughing up of blood.No change in color of mucus.No wheezing.No chest wall deformity  Skin:  no rash or lesions.  GU:  no dysuria, change in color of urine, no urgency or frequency. No flank pain.  Musculoskeletal:  No joint pain or swelling. No decreased range of motion. No back pain.  Psych:  No change in mood or affect. No depression or anxiety. No memory loss.   Past Medical History  Diagnosis Date  . Hypertension   . Obesity, morbid (more than 100 lbs over ideal weight or BMI > 40)   . Degenerative joint disease   . Alcohol abuse   . Umbilical hernia   . Gastroesophageal reflux disease   . Vitamin B12 deficiency 05/22/2011  . Fracture of metatarsal of left foot, closed 05/28/2011  .  Fracture of great toe, left, closed 05/28/2011  . Chronic respiratory failure with hypoxia 05/28/2011  . Urticarial rash 05/31/2011    From Cipro  . Cor pulmonale, chronic February 2013  . Obesity hypoventilation syndrome February 2013  . Pickwickian syndrome   . CHF (congestive heart failure)   . A-fib    Past Surgical History  Procedure Laterality Date  . Cholecystectomy    . Knee surgery     Social History:  reports that he has never smoked. He does not have any smokeless tobacco history on file. He reports that he drinks about .6 ounces of alcohol per week. He reports that he does not use illicit drugs.  Allergies  Allergen Reactions  . Ciprofloxacin Rash    Patient is not aware of the following allergy    Family History  Problem Relation Age of Onset  . Heart attack Father   . Heart failure Father   . Stroke Father   . Hypertension Father      Prior to Admission medications   Medication Sig Start Date End Date Taking? Authorizing Provider  acetaminophen (TYLENOL) 500 MG tablet Take 1,000 mg by mouth every 6 (six) hours as needed for mild pain.   Yes Historical Provider, MD  allopurinol (ZYLOPRIM) 100 MG tablet Take 1 tablet (100 mg total) by mouth daily. 06/26/13  Yes Zannie Cove, MD  aspirin 325 MG tablet Take 1 tablet (325 mg total) by mouth daily. 06/28/13  Yes Zannie Cove, MD  furosemide (LASIX) 20 MG tablet Take 1 tablet (20 mg  total) by mouth daily. 06/26/13  Yes Zannie Cove, MD  metolazone (ZAROXOLYN) 2.5 MG tablet Take 2.5 mg by mouth daily.   Yes Historical Provider, MD  metoprolol tartrate (LOPRESSOR) 25 MG tablet Take 25 mg by mouth 2 (two) times daily.   Yes Historical Provider, MD  Multiple Vitamin (MULITIVITAMIN WITH MINERALS) TABS Take 1 tablet by mouth daily. 05/31/11  Yes Elliot Cousin, MD  omeprazole (PRILOSEC) 20 MG capsule Take 20 mg by mouth daily.   Yes Historical Provider, MD  oxyCODONE-acetaminophen (PERCOCET) 10-325 MG per tablet Take 1 tablet by  mouth daily as needed. For pain. Normally taken at bedtime 05/05/13  Yes Historical Provider, MD  simvastatin (ZOCOR) 10 MG tablet Take 10 mg by mouth daily.   Yes Historical Provider, MD   Physical Exam: Filed Vitals:   10/07/13 1330  BP: 116/100  Pulse: 108  Temp:   Resp: 21    BP 116/100  Pulse 108  Temp(Src) 98 F (36.7 C)  Resp 21  Ht 5\' 10"  (1.778 m)  Wt 157.611 kg (347 lb 7.5 oz)  BMI 49.86 kg/m2  SpO2 97%  General:  Appears calm and comfortable. He is jaundice. Eyes: PERRL, normal lids, irises & conjunctiva ENT: grossly normal hearing, lips & tongue Neck: no LAD, masses or thyromegaly Cardiovascular: Appears to be in atrial fibrillation, ventricular rate is controlled. Telemetry: Atrial fibrillation. Respiratory: CTA bilaterally, no w/r/r. Normal respiratory effort. Abdomen: soft, very mildly tender in the right upper quadrant and epigastric area. Bowel sounds are heard. Skin: no rash or induration seen on limited exam Musculoskeletal: grossly normal tone BUE/BLE Psychiatric: grossly normal mood and affect, speech fluent and appropriate Neurologic: grossly non-focal.          Labs on Admission:  Basic Metabolic Panel:  Recent Labs Lab 10/07/13 1133  NA 132*  K 2.5*  CL 87*  CO2 26  GLUCOSE 122*  BUN 35*  CREATININE 1.42*  CALCIUM 11.0*   Liver Function Tests:  Recent Labs Lab 10/07/13 1133  AST 265*  ALT 389*  ALKPHOS 621*  BILITOT 11.0*  PROT 7.1  ALBUMIN 3.4*    Recent Labs Lab 10/07/13 1133  LIPASE 35   No results found for this basename: AMMONIA,  in the last 168 hours CBC:  Recent Labs Lab 10/07/13 1133  WBC 6.4  HGB 14.8  HCT 41.1  MCV 85.3  PLT 233   Cardiac Enzymes: No results found for this basename: CKTOTAL, CKMB, CKMBINDEX, TROPONINI,  in the last 168 hours  BNP (last 3 results) No results found for this basename: PROBNP,  in the last 8760 hours CBG: No results found for this basename: GLUCAP,  in the last 168  hours  Radiological Exams on Admission: Dg Chest 2 View  10/07/2013   CLINICAL DATA:  Weakness and dizziness.  EXAM: CHEST  2 VIEW  COMPARISON:  06/22/2013.  FINDINGS: Mediastinum and hilar structures normal. Cardiomegaly with normal pulmonary vascularity. Lungs are clear. Degenerative changes thoracic spine.  IMPRESSION: 1. Cardiomegaly. 2. No acute cardiopulmonary disease.   Electronically Signed   By: Maisie Fus  Register   On: 10/07/2013 12:54   Ct Abdomen Pelvis W Contrast  10/07/2013   CLINICAL DATA:  Jaundice.  Weakness with dizziness.  Constipation.  EXAM: CT ABDOMEN AND PELVIS WITH CONTRAST  TECHNIQUE: Multidetector CT imaging of the abdomen and pelvis was performed using the standard protocol following bolus administration of intravenous contrast.  CONTRAST:  OMNIPAQUE IOHEXOL 300 MG/ML  SOLN  COMPARISON:  None.  FINDINGS: Lung Bases: Unremarkable.  Liver:  No focal intraparenchymal mass lesion.  Spleen: Normal uninfused appearance.  Stomach: Normal.  Pancreas: No dilatation of the main pancreatic duct.  Gallbladder/Biliary: Gallbladder is surgically absent. There is intrahepatic biliary duct dilatation. Extrahepatic biliary ducts are dilated. Coronal reformations suggests that there may be a late joining of the left and right ducts in this patient although this may be related to secondary dilatation of the cystic duct remnant. In the head of the pancreas, common bile duct measures up to 18 mm in diameter. In the distal common bile duct, a 13 mm calcified stone is identified (see image 40 of series 3 and image 44 of series 4. There also appears to be other dependent tiny stones in the distal common duct (image 36 of series 3 and image 47 of series 4).  Kidneys/Adrenals: 3.4 cm right adrenal mass has a average attenuation as low as 3 Hounsfield units, consistent with benign adrenal adenoma. There is a lobular left adrenal mass which measures up to 4.7 cm in diameter. This has some regions that are  low enough in attenuation to suggest adenoma, but other regions which measures slightly higher (22 Hounsfield units). Kidneys are normal in appearance bilaterally.  Bowel Loops: The duodenum is normally positioned as is the ligament of Treitz. There is no evidence for small bowel obstruction. Terminal ileum and appendix are normal. Colon is decompressed. No colonic diverticulitis.  Nodes: No lymphadenopathy in the abdomen. No pelvic sidewall lymphadenopathy.  Vasculature: Atherosclerotic calcification is noted in the wall of the abdominal aorta without aneurysm.  Pelvic Genitourinary: Bladder is normal for the degree of distention. Prostate gland is unenlarged.  Bones/Musculoskeletal: Bone windows reveal no worrisome lytic or sclerotic osseous lesions.  Body Wall: A ventral hernia is identified, containing only fat. Although this projects to the left of the midline on axial imaging, it appears to arise at the midline of the rectus sheath and I think it is simply the laxity of the fascia which creates this leftward position. The hernia sac measures 6.3 x 11.2 x 6.5 cm. There is a tiny amount of fluid within the hernia.  Other: No fluid in the anatomic pelvis.  IMPRESSION: 1. Intra and extrahepatic biliary duct dilatation secondary to a 13 mm stone in the distal common bile duct. Other tiny (at least 2) calcified stones are seen dependently in common bile duct, proximal to the obstructing large stone. 2. Bilateral adrenal masses. The right adrenal lesion is compatible with an adenoma. Left adrenal mass is also very low-attenuation and although it cannot be definitively characterize as an adenoma on this exam, that is the likely etiology. 3. Ventral hernia contains only fat.   Electronically Signed   By: Kennith CenterEric  Mansell M.D.   On: 10/07/2013 15:22    EKG: Independently reviewed. Atrial fibrillation without any acute ST-T wave changes.  Assessment/Plan   1. Obstructive jaundice secondary to 13 mm stone in the  common bile duct. 2. Chronic atrial fibrillation. Patient is not on anticoagulation. He does have a history of alcohol abuse and may not be a good candidate for this. 3. Morbid obesity. 4. Hypercalcemia possibly related to dehydration. 5. Dehydration. 6. History of diastolic dysfunction. History of cor pulmonale. These problems appear to be stable at the present time. 7. Hypokalemia.  Plan: 1. Admit to medical floor. 2. Gastroenterology consultation. He will likely need ERCP. 3. Monitor closely hemodynamic stability in this morbidly obese patient who is at high risk of  decompensation. 4. Replace potassium and gentle IV fluids for rehydration.  Further recommendations will depend on patient's hospital progress..   Code Status: Full code.   Family Communication: I discussed the plan with patient at the bedside.  Disposition Plan: Home when medically stable.   Time spent: 60 minutes.  Wilson Singer [facility] Pager [phone].  **Disclaimer: This note may have been dictated with voice recognition software. Similar sounding words can inadvertently be transcribed and this note may contain transcription errors which may not have been corrected upon publication of note.**

## 2013-10-08 LAB — CBC
HEMATOCRIT: 38.1 % — AB (ref 39.0–52.0)
Hemoglobin: 13.5 g/dL (ref 13.0–17.0)
MCH: 30.7 pg (ref 26.0–34.0)
MCHC: 35.4 g/dL (ref 30.0–36.0)
MCV: 86.6 fL (ref 78.0–100.0)
Platelets: 179 10*3/uL (ref 150–400)
RBC: 4.4 MIL/uL (ref 4.22–5.81)
RDW: 15 % (ref 11.5–15.5)
WBC: 5.5 10*3/uL (ref 4.0–10.5)

## 2013-10-08 LAB — COMPREHENSIVE METABOLIC PANEL
ALT: 295 U/L — AB (ref 0–53)
AST: 214 U/L — AB (ref 0–37)
Albumin: 2.9 g/dL — ABNORMAL LOW (ref 3.5–5.2)
Alkaline Phosphatase: 596 U/L — ABNORMAL HIGH (ref 39–117)
Anion gap: 15 (ref 5–15)
BUN: 31 mg/dL — ABNORMAL HIGH (ref 6–23)
CALCIUM: 10.4 mg/dL (ref 8.4–10.5)
CO2: 28 mEq/L (ref 19–32)
CREATININE: 1.21 mg/dL (ref 0.50–1.35)
Chloride: 92 mEq/L — ABNORMAL LOW (ref 96–112)
GFR calc Af Amer: 72 mL/min — ABNORMAL LOW (ref >90)
GFR calc non Af Amer: 62 mL/min — ABNORMAL LOW (ref >90)
Glucose, Bld: 98 mg/dL (ref 70–99)
Potassium: 3.1 mEq/L — ABNORMAL LOW (ref 3.7–5.3)
Sodium: 135 mEq/L — ABNORMAL LOW (ref 137–147)
Total Bilirubin: 10.4 mg/dL — ABNORMAL HIGH (ref 0.3–1.2)
Total Protein: 6.3 g/dL (ref 6.0–8.3)

## 2013-10-08 MED ORDER — OXYCODONE-ACETAMINOPHEN 5-325 MG PO TABS
1.0000 | ORAL_TABLET | Freq: Four times a day (QID) | ORAL | Status: DC | PRN
  Administered 2013-10-08 – 2013-10-09 (×3): 1 via ORAL
  Filled 2013-10-08 (×3): qty 1

## 2013-10-08 MED ORDER — MORPHINE SULFATE 2 MG/ML IJ SOLN
1.0000 mg | INTRAMUSCULAR | Status: DC | PRN
  Administered 2013-10-08 (×3): 2 mg via INTRAVENOUS
  Filled 2013-10-08 (×3): qty 1

## 2013-10-08 MED ORDER — PRO-STAT SUGAR FREE PO LIQD
30.0000 mL | Freq: Three times a day (TID) | ORAL | Status: DC
  Filled 2013-10-08 (×3): qty 30

## 2013-10-08 MED ORDER — OXYCODONE HCL 5 MG PO TABS
5.0000 mg | ORAL_TABLET | Freq: Four times a day (QID) | ORAL | Status: DC | PRN
  Administered 2013-10-08 – 2013-10-09 (×3): 5 mg via ORAL
  Filled 2013-10-08 (×3): qty 1

## 2013-10-08 MED ORDER — OXYCODONE-ACETAMINOPHEN 10-325 MG PO TABS: 1.0000 | ORAL_TABLET | Freq: Four times a day (QID) | ORAL | Status: DC | PRN

## 2013-10-08 MED ORDER — SORBITOL 70 % SOLN: 20.0000 mL | Freq: Every day | Status: DC | PRN

## 2013-10-08 MED ORDER — POTASSIUM CHLORIDE CRYS ER 20 MEQ PO TBCR
40.0000 meq | EXTENDED_RELEASE_TABLET | Freq: Two times a day (BID) | ORAL | Status: DC
  Administered 2013-10-08 – 2013-10-10 (×5): 40 meq via ORAL
  Filled 2013-10-08 (×5): qty 2

## 2013-10-08 NOTE — Progress Notes (Signed)
Note: This document was prepared with digital dictation and possible smart phrase technology. Any transcriptional errors that result from this process are unintentional.   Frederick Brown ZOX:[id number] DOB: [birth year] DOA: 10/07/2013 PCP: [name], MD  Brief narrative:  63 y/o ? known h/o chronic diastolic dys + cor pulmonale [ef 55-60% 05/22/11], h/o Afib [not coumadin candidate], prior ETOH abuse, h/o OA R knee s/p arthroscopy/debridement, H/o cholecystitis 08/14/2010 [some gall stones unretrievable at the time per OP note]--recent admissions 06/01/13 [facility] for acute kidney injury, pyelonephritis, hypoxic respiratory failure and a prior admission in January 2015 at [facility] for similar issues admitted 10/07/13 wth Upper and lower quad pain since mid-june, then 2-3 noted days of yellowing progressively of the skin.   Per report he also has lost 50 pounds in the last 2 to without vomiting melena and has not had bowel movements for 5 days He is not really been interested in eating or drinking as he has had no appetite He was found to have elevated LFTs alkaline phosphatase 621, AST 265, ALT 389 , total bilirubin 11 , direct bili 9.0 potassium was 2.5  BUN/creatinine 35/1.42 Hemoglobin 13.5 hematocrit 38 lipids 179 Urinalysis showed large bilirubin , ketones 15 urobilinogen increased 4.0 negative leukocytes negative nitrates  CT abdomen and pelvis showed intra and extrahepatic biliary duct dilatation secondary to a 13  mm stone in the distal common bile duct. Bilateral adrenal masses. The right adrenal lesion is compatible  with an adenoma and a ventral hernia contains only fat.  Past medical history-As per Problem list Chart reviewed as below- reviewed  Consultants:   [facility]  Procedures:  CT scan  Antibiotics:  None currently   Subjective  Hungry abd pain ~ the same No n/v currently No cp No stool 5 days   Objective    Interim  History: none  Telemetry: afib 110-120   Objective: Filed Vitals:   10/07/13 1849 10/07/13 2245 10/08/13 0511 10/08/13 1033  BP: 118/69 102/79 100/69 107/57  Pulse: 116 88 112   Temp: 97.8 F (36.6 C) 98.2 F (36.8 C) 97.7 F (36.5 C) 97.6 F (36.4 C)  TempSrc:  Oral Oral Oral  Resp: 20 20 20 20   Height: 5\' 10"  (1.778 m)     Weight: 157.398 kg (347 lb)     SpO2: 100% 100% 97% 100%    Intake/Output Summary (Last 24 hours) at 10/08/13 1034 Last data filed at 10/08/13 1033  Gross per 24 hour  Intake   1660 ml  Output   1700 ml  Net    -40 ml    Exam:  General: eomi, ncat Cardiovascular:  s1 s2 tachy Respiratory: clear, no added sound Abdomen:  Slightly tender Lower quadrant and epigastrium-cannot appreciate organomegally Skin jaundiced. Neurointact  Data Reviewed: Basic Metabolic Panel:  Recent Labs Lab 10/07/13 1130 10/07/13 1133 10/08/13 0618  NA  --  132* 135*  K  --  2.5* 3.1*  CL  --  87* 92*  CO2  --  26 28  GLUCOSE  --  122* 98  BUN  --  35* 31*  CREATININE  --  1.42* 1.21  CALCIUM  --  11.0* 10.4  MG 1.6  --   --    Liver Function Tests:  Recent Labs Lab 10/07/13 1133 10/08/13 0618  AST 265* 214*  ALT 389* 295*  ALKPHOS 621* 596*  BILITOT 11.0* 10.4*  PROT 7.1 6.3  ALBUMIN 3.4* 2.9*    Recent  Labs Lab 10/07/13 1133  LIPASE 35   No results found for this basename: AMMONIA,  in the last 168 hours CBC:  Recent Labs Lab 10/07/13 1133 10/08/13 0618  WBC 6.4 5.5  HGB 14.8 13.5  HCT 41.1 38.1*  MCV 85.3 86.6  PLT 233 179   Cardiac Enzymes: No results found for this basename: CKTOTAL, CKMB, CKMBINDEX, TROPONINI,  in the last 168 hours BNP: No components found with this basename: POCBNP,  CBG: No results found for this basename: GLUCAP,  in the last 168 hours  No results found for this or any previous visit (from the past 240 hour(s)).   Studies:              All Imaging reviewed and is as per above notation    Scheduled Meds: . allopurinol  100 mg Oral Daily  . cefTRIAXone (ROCEPHIN)  IV  1 g Intravenous Q24H  . furosemide  20 mg Oral Daily  . metolazone  2.5 mg Oral Daily  . metoprolol tartrate  25 mg Oral BID  . pantoprazole  40 mg Oral Daily  . potassium chloride SA  40 mEq Oral BID   Continuous Infusions: . 0.9 % NaCl with KCl 40 mEq / L 100 mL/hr (10/07/13 1930)     Assessment/Plan: 1. Choledocholithiasis with risk of ascending cholangitis-continue Rocephin 1 g every 24. Appreciate gastroenterology input. [facility] for potential ERCP. Patient has been cleared for diet by gastroenterology. Repeat LFTs a.m. with INR 2. Profound hypokalemia-replaced with IV and oral. Repeat labs in 3. Cor pulmonale + diastolic dysfunction-continue metolazone 2.5 daily, Lasix 20 daily-continue metoprolol 25 twice a day 4. Adrenal masses-per CT scan of  abdomen these are probably benign 5. Chronic kidney disease stage I-stable. If worsens with this continue metolazone and continue Lasix. 6. Constipation-given sorbitol 20 mL Morbid obesity, Body mass index is 49.79 kg/(m^2).-has lost 50 pounds recently. Congratulated. [facility]. 7. Chronic respiratory failure secondary to #3.  Code Status: Full Family Communication: None at bedside Disposition Plan: Inpatient   Pleas KochJai Cortlynn Hollinsworth, MD  [facility] Pager [phone] 10/08/2013, 10:34 AM    LOS: 1 day

## 2013-10-08 NOTE — Discharge Summary (Signed)
Physician Discharge Summary  Frederick Brown:[id number] DOB: 1950/09/25 DOA: 10/07/2013  PCP: [name], MD  Admit date: 10/07/2013 Discharge date: 10/08/2013  Time spent: 40 minutes  Recommendations for Outpatient Follow-up:  1. Patient being transferred to [facility] under care Dr. [name] for potential ERCP and further management of biliary ductal stone 2. Lower abd pain was thought to be secondary to panniculitis-he was transitioned from ceftriaxone -Zosyn to cover group A strep, staph aureus, as well as IV fluconazole for fungal component--if persists would consider repeat CT scan abdomen 3. Had corrected calcium of 12 her-unclear etiology. Workup being done. Followup vitamin D, PTH, PTH Rh, TSH-see below but consider restarting 24 urine for calcium.  He may need IV Lasix and was given only one dose on 7/12. Repeat labs in a.m. 7/13 4. Continue clear diet until review at [facility].  Discharge Diagnoses:  Active Problems:   Atrial fibrillation   Alcohol abuse   Hypercalcemia   Hypotension   Diastolic dysfunction   Jaundice   Morbid obesity   Discharge Condition: Stable  Diet recommendation: Clear liquid  Filed Weights   10/07/13 1121 10/07/13 1345 10/07/13 1849  Weight: 179.171 kg (395 lb) 157.611 kg (347 lb 7.5 oz) 157.398 kg (347 lb)    History of present illness:   63 y/o ? known h/o chronic diastolic dys + cor pulmonale [ef 55-60% 05/22/11], h/o Afib [not coumadin candidate], prior ETOH abuse, h/o OA R knee s/p arthroscopy/debridement, H/o cholecystitis 08/14/2010 [some gall stones unretrievable at the time per OP note]--recent admissions 06/01/13 [facility] for acute kidney injury, pyelonephritis, hypoxic respiratory failure and a prior admission in January 2015 at [facility] for similar issues admitted 10/07/13 wth Upper and lower quad pain since mid-june, then 2-3 noted days of yellowing progressively of the skin.   Per report  he also has lost 50 pounds in the last 2 to without vomiting melena and has not had bowel movements for 5 days He is not really been interested in eating or drinking as he has had no appetite He was found to have elevated LFTs alkaline phosphatase 621, AST 265, ALT 389 , total bilirubin 11 , direct bili 9.0 potassium was 2.5  BUN/creatinine 35/1.42 Hemoglobin 13.5 hematocrit 38 lipids 179 Urinalysis showed large bilirubin , ketones 15 urobilinogen increased 4.0 negative leukocytes negative nitrates  CT abdomen and pelvis showed intra and extrahepatic biliary duct dilatation secondary to a 13  mm stone in the distal common bile duct. Bilateral adrenal masses. The right adrenal lesion is compatible  with an adenoma and a ventral hernia contains only fat.    Gastroenterology Dr. [name] evaluated the patient and after discussion with anesthesiology was thought prudent given his habitus as well as potential difficulty with anesthesia to transfer him to a tertiary care center for further management including ERCP   Please see below for Hospital course  Interval Uncomfortable and in pain. Requests something for itching earlier today. Pain is in the lower abdomen around her pannus-his not really able to eat much or drink much either as he continues to have this abdominal discomfort He has no vomiting but is somewhat nauseous and taking by mouth to see suspect this He had a stool yesterday He has no chest pain or shortness of breath   Hospital Course:   1. Choledocholithiasis with risk of ascending cholangitis-initially on Rocephin 1 g every 24--see below. [facility] Dr. [name] did accept patient transfer on 10/10/13  for ERCP. LFT's steadily increasing, total bili 14.1, AST 233, ALT 295, alkaline phosphatase 800 up from admission 600--he developed itching and as such Actigall was prescribed for him pending definitive treatment of 13 mm distal CBD gallstone 2. Abd pain-possible  panniculitis.  CT on admit no signs diverticular disease. Oxycodone for now. Thought to be subsequently secondary to pannus infection. Initially was on Rocephin 1 g every 24--transitioned  to Zosyn 3.375 3 times a day. Seems to be on 7/12 /15 added fluconazole 200 mg which may be cut down subsequently to 100 mg daily for a period of 14-21 days  3. Profound hypokalemia-replaced with IV and oral. Currently k=4.5 on day of discharge  4. Unexplained hypercalcemia-discontinued metolazone. Nephrology was consulted here as corrected calcium on 7/12 was 12. I did add Lasix IV 40 mg x1. Nephrology started patient on saline 75 cc per hour as well. Patient may benefit from calcitonin/pamidronate next 1-2 days if calcium still elevated. Screening labs such as vitamin D 1/25 dihydroxy , parathormone, PTH related to GERD, TSH were ordered. Because 24-hour urine would've been inaccurate given his impending [name] this was discontinued and can be restarted [facility] thought appropriate 5. Cor pulmonale + diastolic dysfunction-held metolazone 2.5 daily, Lasix 20 daily-continue metoprolol 25 twice a day 6. Adrenal masses-per CT scan of abdomen these are probably benign 7. Chronic kidney disease stage I-stable. Bun/creat have trended downwards 8. Constipation-given sorbitol 20 mL-had loose stool 7/11 so use prn-has passed stool 10/09/48 9. Morbid obesity, Body mass index is 51.07 kg/(m^2).-has lost 50 pounds recently. Congratulated. Need [facility]. 9. Chronic respiratory failure secondary to #3.   Consultants:  [facility]-renal Procedures:  CT scan Antibiotics:  Ceftriaxone 7/9-->7/12 Fluconazole 7/12 Zosyn 7/12  Discharge Exam: Filed Vitals:   10/08/13 0511  BP: 100/69  Pulse: 112  Temp: 97.7 F (36.5 C)  Resp: 20    General:  alert morbidly obese no apparent distress  Cardiovascular:  S1-S2 no murmur rub or gallop  Respiratory:  clinically  clear to   Discharge Instructions You were cared for by a hospitalist during your hospital stay. If you have any questions about your discharge medications or the care you received while you were in the hospital after you are discharged, you can call the unit and asked to speak with the hospitalist on call if the hospitalist that took care of you is not available. Once you are discharged, your primary care physician will handle any further medical issues. Please note that NO REFILLS for any discharge medications will be authorized once you are discharged, as it is imperative that you return to your primary care physician (or establish a relationship with a primary care physician if you do not have one) for your aftercare needs so that they can reassess your need for medications and monitor your lab values.     Medication List    ASK your doctor about these medications       acetaminophen 500 MG tablet  Commonly known as:  TYLENOL  Take 1,000 mg by mouth every 6 (six) hours as needed for mild pain.     allopurinol 100 MG tablet  Commonly known as:  ZYLOPRIM  Take 1 tablet (100 mg total) by mouth daily.     aspirin 325 MG tablet  Take 1 tablet (325 mg total) by mouth daily.     furosemide 20 MG tablet  Commonly known as:  LASIX  Take 1 tablet (  20 mg total) by mouth daily.     metolazone 2.5 MG tablet  Commonly known as:  ZAROXOLYN  Take 2.5 mg by mouth daily.     metoprolol tartrate 25 MG tablet  Commonly known as:  LOPRESSOR  Take 25 mg by mouth 2 (two) times daily.     multivitamin with minerals Tabs tablet  Take 1 tablet by mouth daily.     omeprazole 20 MG capsule  Commonly known as:  PRILOSEC  Take 20 mg by mouth daily.     oxyCODONE-acetaminophen 10-325 MG per tablet  Commonly known as:  PERCOCET  Take 1 tablet by mouth daily as needed. For pain. Normally taken at bedtime     simvastatin 10 MG tablet  Commonly known as:  ZOCOR  Take 10 mg by mouth daily.        Allergies  Allergen Reactions  . Ciprofloxacin Rash    Patient is not aware of the following allergy      The results of significant diagnostics from this hospitalization (including imaging, microbiology, ancillary and laboratory) are listed below for reference.    Significant Diagnostic Studies: Dg Chest 2 View  10/07/2013   CLINICAL DATA:  Weakness and dizziness.  EXAM: CHEST  2 VIEW  COMPARISON:  06/22/2013.  FINDINGS: Mediastinum and hilar structures normal. Cardiomegaly with normal pulmonary vascularity. Lungs are clear. Degenerative changes thoracic spine.  IMPRESSION: 1. Cardiomegaly. 2. No acute cardiopulmonary disease.   Electronically Signed   By: Maisie Fus  Register   On: 10/07/2013 12:54   Ct Abdomen Pelvis W Contrast  10/07/2013   CLINICAL DATA:  Jaundice.  Weakness with dizziness.  Constipation.  EXAM: CT ABDOMEN AND PELVIS WITH CONTRAST  TECHNIQUE: Multidetector CT imaging of the abdomen and pelvis was performed using the standard protocol following bolus administration of intravenous contrast.  CONTRAST:  OMNIPAQUE IOHEXOL 300 MG/ML  SOLN  COMPARISON:  None.  FINDINGS: Lung Bases: Unremarkable.  Liver:  No focal intraparenchymal mass lesion.  Spleen: Normal uninfused appearance.  Stomach: Normal.  Pancreas: No dilatation of the main pancreatic duct.  Gallbladder/Biliary: Gallbladder is surgically absent. There is intrahepatic biliary duct dilatation. Extrahepatic biliary ducts are dilated. Coronal reformations suggests that there may be a late joining of the left and right ducts in this patient although this may be related to secondary dilatation of the cystic duct remnant. In the head of the pancreas, common bile duct measures up to 18 mm in diameter. In the distal common bile duct, a 13 mm calcified stone is identified (see image 40 of series 3 and image 44 of series 4. There also appears to be other dependent tiny stones in the distal common duct (image 36 of series 3 and image  47 of series 4).  Kidneys/Adrenals: 3.4 cm right adrenal mass has a average attenuation as low as 3 Hounsfield units, consistent with benign adrenal adenoma. There is a lobular left adrenal mass which measures up to 4.7 cm in diameter. This has some regions that are low enough in attenuation to suggest adenoma, but other regions which measures slightly higher (22 Hounsfield units). Kidneys are normal in appearance bilaterally.  Bowel Loops: The duodenum is normally positioned as is the ligament of Treitz. There is no evidence for small bowel obstruction. Terminal ileum and appendix are normal. Colon is decompressed. No colonic diverticulitis.  Nodes: No lymphadenopathy in the abdomen. No pelvic sidewall lymphadenopathy.  Vasculature: Atherosclerotic calcification is noted in the wall of the abdominal aorta without aneurysm.  Pelvic Genitourinary: Bladder is normal for the degree of distention. Prostate gland is unenlarged.  Bones/Musculoskeletal: Bone windows reveal no worrisome lytic or sclerotic osseous lesions.  Body Wall: A ventral hernia is identified, containing only fat. Although this projects to the left of the midline on axial imaging, it appears to arise at the midline of the rectus sheath and I think it is simply the laxity of the fascia which creates this leftward position. The hernia sac measures 6.3 x 11.2 x 6.5 cm. There is a tiny amount of fluid within the hernia.  Other: No fluid in the anatomic pelvis.  IMPRESSION: 1. Intra and extrahepatic biliary duct dilatation secondary to a 13 mm stone in the distal common bile duct. Other tiny (at least 2) calcified stones are seen dependently in common bile duct, proximal to the obstructing large stone. 2. Bilateral adrenal masses. The right adrenal lesion is compatible with an adenoma. Left adrenal mass is also very low-attenuation and although it cannot be definitively characterize as an adenoma on this exam, that is the likely etiology. 3. Ventral hernia  contains only fat.   Electronically Signed   By: Kennith Center M.D.   On: 10/07/2013 15:22    Microbiology: No results found for this or any previous visit (from the past 240 hour(s)).   Labs: Basic Metabolic Panel:  Recent Labs Lab 10/07/13 1130 10/07/13 1133 10/08/13 0618  NA  --  132* 135*  K  --  2.5* 3.1*  CL  --  87* 92*  CO2  --  26 28  GLUCOSE  --  122* 98  BUN  --  35* 31*  CREATININE  --  1.42* 1.21  CALCIUM  --  11.0* 10.4  MG 1.6  --   --    Liver Function Tests:  Recent Labs Lab 10/07/13 1133 10/08/13 0618  AST 265* 214*  ALT 389* 295*  ALKPHOS 621* 596*  BILITOT 11.0* 10.4*  PROT 7.1 6.3  ALBUMIN 3.4* 2.9*    Recent Labs Lab 10/07/13 1133  LIPASE 35   No results found for this basename: AMMONIA,  in the last 168 hours CBC:  Recent Labs Lab 10/07/13 1133 10/08/13 0618  WBC 6.4 5.5  HGB 14.8 13.5  HCT 41.1 38.1*  MCV 85.3 86.6  PLT 233 179   Cardiac Enzymes: No results found for this basename: CKTOTAL, CKMB, CKMBINDEX, TROPONINI,  in the last 168 hours BNP: BNP (last 3 results) No results found for this basename: PROBNP,  in the last 8760 hours CBG: No results found for this basename: GLUCAP,  in the last 168 hours     Signed:  Rhetta Mura  [facility] 10/08/2013, 8:30 AM

## 2013-10-08 NOTE — Progress Notes (Signed)
INITIAL NUTRITION ASSESSMENT  DOCUMENTATION CODES Per approved criteria  -Severe malnutrition in the context of chronic illness   INTERVENTION: ProStat 30 ml TID (each 30 ml provides 100 kcal, 15 gr protein)   NUTRITION DIAGNOSIS: Inadequate oral intake related to anorexia, abdominal pain as evidenced by unplanned wt loss of 50# in past  2-2.5 months.   Goal: Pt to meet >/= 90% of their estimated nutrition needs    Monitor:  Diet advancement, po intake, weight changes and labs   Reason for Assessment: Malnutrition Screen Score =  5  63 y.o. male  ASSESSMENT: Pt has hx of poor po intake. Admitted 3/25 AKI on CKD poor po's noted. He presents to ED yesterday with c/o anorexia past month. He has increased weakness and unintentional weight loss of 50#, 13% <90 days. Pt says the past three days he has only eaten 1/2 honey bun and a couple of peanut butter crackers.  He has obstructive jaundice and was dehydrated on admission.  He meets criteria for severe malnutrition in the context of chronic illness as evidenced by energy intake </= 75% for >/= 1 month and 13% wt loss <90 days.   Height: Ht Readings from Last 1 Encounters:  10/07/13 5\' 10"  (1.778 m)    Weight: Wt Readings from Last 1 Encounters:  10/07/13 347 lb (157.398 kg)    Ideal Body Weight: 166# (75 kg)  % Ideal Body Weight: 209%  Wt Readings from Last 10 Encounters:  10/07/13 347 lb (157.398 kg)  06/24/13 397 lb 0.8 oz (180.1 kg)  05/31/11 511 lb 14.5 oz (232.2 kg)    Usual Body Weight: 395-400#  % Usual Body Weight: 87%  BMI:  Body mass index is 49.79 kg/(m^2).obesity class III  Estimated Nutritional Needs: Kcal: 2250-2400 Protein: >150 gr daily Fluid: >2300 ml daily  Skin: intact  Diet Order: Clear Liquid  EDUCATION NEEDS: -Education not appropriate at this time   Intake/Output Summary (Last 24 hours) at 10/08/13 1049 Last data filed at 10/08/13 1033  Gross per 24 hour  Intake   1900 ml   Output   1700 ml  Net    200 ml    Last BM: 10/03/13  Labs:   Recent Labs Lab 10/07/13 1130 10/07/13 1133 10/08/13 0618  NA  --  132* 135*  K  --  2.5* 3.1*  CL  --  87* 92*  CO2  --  26 28  BUN  --  35* 31*  CREATININE  --  1.42* 1.21  CALCIUM  --  11.0* 10.4  MG 1.6  --   --   GLUCOSE  --  122* 98    CBG (last 3)  No results found for this basename: GLUCAP,  in the last 72 hours  Scheduled Meds: . allopurinol  100 mg Oral Daily  . cefTRIAXone (ROCEPHIN)  IV  1 g Intravenous Q24H  . furosemide  20 mg Oral Daily  . metolazone  2.5 mg Oral Daily  . metoprolol tartrate  25 mg Oral BID  . pantoprazole  40 mg Oral Daily  . potassium chloride SA  40 mEq Oral BID    Continuous Infusions: . 0.9 % NaCl with KCl 40 mEq / L 100 mL/hr (10/07/13 1930)    Past Medical History  Diagnosis Date  . Hypertension   . Obesity, morbid (more than 100 lbs over ideal weight or BMI > 40)   . Degenerative joint disease   . Alcohol abuse   .  Umbilical hernia   . Gastroesophageal reflux disease   . Vitamin B12 deficiency 05/22/2011  . Fracture of metatarsal of left foot, closed 05/28/2011  . Fracture of great toe, left, closed 05/28/2011  . Chronic respiratory failure with hypoxia 05/28/2011  . Urticarial rash 05/31/2011    From Cipro  . Cor pulmonale, chronic February 2013  . Obesity hypoventilation syndrome February 2013  . Pickwickian syndrome   . CHF (congestive heart failure)   . A-fib     Past Surgical History  Procedure Laterality Date  . Cholecystectomy    . Knee surgery      Royann ShiversLynn Darrin Apodaca MS,RD,CSG,LDN Office: #[phone] Pager: [phone]

## 2013-10-08 NOTE — Care Management Utilization Note (Signed)
UR completed 

## 2013-10-08 NOTE — Care Management Note (Signed)
    Page 1 of 1   10/08/2013     4:12:59 PM CARE MANAGEMENT NOTE 10/08/2013  Patient:  Frederick Brown,Frederick Brown   Account Number:  [id number]  Date Initiated:  10/08/2013  Documentation initiated by:  Anibal HendersonBOLDEN,Alianys Chacko  Subjective/Objective Assessment:   Admitted from home with jaundice, stone in the common duct. Needs ERCP. Pt is very large and is a risk for anesthesia. [facility] to have this done at a tertiary center     Action/Plan:   No CM  needs   Anticipated DC Date:  10/10/2013   Anticipated DC Plan:  ACUTE TO ACUTE TRANS      DC Planning Services  CM consult      Choice offered to / List presented to:             Status of service:  Completed, signed off Medicare Important Message given?   (If response is "NO", the following Medicare IM given date fields will be blank) Date Medicare IM given:   Medicare IM given by:   Date Additional Medicare IM given:   Additional Medicare IM given by:    Discharge Disposition:  ACUTE TO ACUTE TRANS  Per UR Regulation:  Reviewed for med. necessity/level of care/duration of stay  If discussed at Long Length of Stay Meetings, dates discussed:    Comments:  10/08/13 1600 Anibal HendersonGeneva Justeen Hehr RN/CM

## 2013-10-08 NOTE — Progress Notes (Signed)
Patient feels better. He is hungry. He is having less pain. Patient's condition was discussed with Dr. Jayme CloudGonzalez of anesthesiology who felt she was high risk for procedure to be performed at this facility. Therefore I contacted Ms. Darra LisAmanda Williams, quadrant later for biliary service  At [facility]. They have kindly agreed to accept patient in transfer on 10/10/2013. He will undergo ERCP on 10/11/2013. Patient aware of this plan and is in agreement

## 2013-10-09 LAB — COMPREHENSIVE METABOLIC PANEL
ALT: 291 U/L — ABNORMAL HIGH (ref 0–53)
ANION GAP: 14 (ref 5–15)
AST: 216 U/L — ABNORMAL HIGH (ref 0–37)
Albumin: 2.8 g/dL — ABNORMAL LOW (ref 3.5–5.2)
Alkaline Phosphatase: 659 U/L — ABNORMAL HIGH (ref 39–117)
BILIRUBIN TOTAL: 11.9 mg/dL — AB (ref 0.3–1.2)
BUN: 24 mg/dL — AB (ref 6–23)
CHLORIDE: 96 meq/L (ref 96–112)
CO2: 25 mEq/L (ref 19–32)
Calcium: 10.6 mg/dL — ABNORMAL HIGH (ref 8.4–10.5)
Creatinine, Ser: 1.06 mg/dL (ref 0.50–1.35)
GFR calc non Af Amer: 73 mL/min — ABNORMAL LOW (ref >90)
GFR, EST AFRICAN AMERICAN: 85 mL/min — AB (ref >90)
GLUCOSE: 109 mg/dL — AB (ref 70–99)
Potassium: 3.9 mEq/L (ref 3.7–5.3)
Sodium: 135 mEq/L — ABNORMAL LOW (ref 137–147)
Total Protein: 6.2 g/dL (ref 6.0–8.3)

## 2013-10-09 LAB — CBC
HEMATOCRIT: 38.5 % — AB (ref 39.0–52.0)
Hemoglobin: 13.4 g/dL (ref 13.0–17.0)
MCH: 30.7 pg (ref 26.0–34.0)
MCHC: 34.8 g/dL (ref 30.0–36.0)
MCV: 88.1 fL (ref 78.0–100.0)
Platelets: 197 10*3/uL (ref 150–400)
RBC: 4.37 MIL/uL (ref 4.22–5.81)
RDW: 15.3 % (ref 11.5–15.5)
WBC: 5.9 10*3/uL (ref 4.0–10.5)

## 2013-10-09 LAB — PROTIME-INR
INR: 1 (ref 0.00–1.49)
Prothrombin Time: 13.2 seconds (ref 11.6–15.2)

## 2013-10-09 MED ORDER — SODIUM CHLORIDE 0.9 % IJ SOLN
3.0000 mL | Freq: Two times a day (BID) | INTRAMUSCULAR | Status: DC
  Administered 2013-10-09 – 2013-10-10 (×3): 3 mL via INTRAVENOUS

## 2013-10-09 MED ORDER — SORBITOL 70 % SOLN: 20.0000 mL | Freq: Every day | Status: DC | PRN

## 2013-10-09 MED ORDER — OXYCODONE-ACETAMINOPHEN 5-325 MG PO TABS
1.0000 | ORAL_TABLET | Freq: Four times a day (QID) | ORAL | Status: DC | PRN
  Administered 2013-10-09 – 2013-10-10 (×3): 1 via ORAL
  Filled 2013-10-09 (×3): qty 1

## 2013-10-09 MED ORDER — SODIUM CHLORIDE 0.9 % IV SOLN
250.0000 mL | INTRAVENOUS | Status: DC | PRN
  Administered 2013-10-09: 250 mL via INTRAVENOUS

## 2013-10-09 MED ORDER — OXYCODONE HCL 5 MG PO TABS
5.0000 mg | ORAL_TABLET | ORAL | Status: DC | PRN
  Administered 2013-10-09 – 2013-10-10 (×6): 5 mg via ORAL
  Filled 2013-10-09 (×6): qty 1

## 2013-10-09 MED ORDER — SODIUM CHLORIDE 0.9 % IJ SOLN: 3.0000 mL | INTRAMUSCULAR | Status: DC | PRN

## 2013-10-09 NOTE — Progress Notes (Signed)
Subjective; Patient states he experienced sharp pain in lower abdomen about 3 hours after eating lunch yesterday. He did not experience pain in upper abdomen nausea or vomiting. He just had formed stool. He denies fever or chills.  Objective; BP 138/87  Pulse 107  Temp(Src) 98.4 F (36.9 C) (Oral)  Resp 20  Ht 5\' 10"  (1.778 m)  Wt 355 lb 14.4 oz (161.435 kg)  BMI 51.07 kg/m2  SpO2 100% Patient's weight on standing scale was 355.6 pounds. Patient is alert and in no acute distress. Conjunctiva is pink. Sclerae is icteric. Abdomen is very large and flabby with umbilicus hernia. He has mild generalized tenderness without guarding. Liver edge is firm below RCM and it's mildly tender. No LE edema noted.  Lab data; WBC 5.9, H&H 13.4 and 38.5, platelet count 197K Serum sodium 135, potassium 3.9, chloride 96, CO2 25, glucose 109, BUN 24 and creatinine 1.06. Bilirubin 11.9, AP 659, AST 216, ALT 291 and albumin 2.8. Serum calcium 10.6   Assessment; #1. Obstructive jaundice secondary to choledocholithiasis.  Patient is on ceftriaxone as he is at risk for cholangitis or could have low-grade cholangitis. He is high risk for therapeutic ERCP given morbid obesity and he is waiting to be [facility] in a.[facility].  #2. Mild hypercalcemia hospital due to dehydration. Calcium is coming down. #3. Hypokalemia has resolved with therapy. #4. Postprandial lower abdominal pain. Abdominal exam is unremarkable except mild tenderness in CT did not show any changes of colitis or diverticulitis.

## 2013-10-09 NOTE — Progress Notes (Signed)
Note: This document was prepared with digital dictation and possible smart phrase technology. Any transcriptional errors that result from this process are unintentional.   Frederick Brown [id number] DOB: Jun 22, 1950 DOA: 10/07/2013 PCP: Kirk RuthsMCGOUGH,WILLIAM M, MD  Brief narrative:  63 y/o ? known h/o chronic diastolic dys + cor pulmonale [ef 55-60% 05/22/11], h/o Afib [not coumadin candidate], prior ETOH abuse, h/o OA R knee s/p arthroscopy/debridement, H/o cholecystitis 08/14/2010 [some gall stones unretrievable at the time per OP note]--recent admissions 06/01/13 [facility] for acute kidney injury, pyelonephritis, hypoxic respiratory failure and a prior admission in January 2015 at [facility] for similar issues admitted 10/07/13 wth Upper and lower quad pain since mid-june, then 2-3 noted days of yellowing progressively of the skin.   Per report he also has lost 50 pounds in the last 2 to without vomiting melena and has not had bowel movements for 5 days He is not really been interested in eating or drinking as he has had no appetite He was found to have elevated LFTs alkaline phosphatase 621, AST 265, ALT 389 , total bilirubin 11 , direct bili 9.0 potassium was 2.5  BUN/creatinine 35/1.42 Hemoglobin 13.5 hematocrit 38 lipids 179 Urinalysis showed large bilirubin , ketones 15 urobilinogen increased 4.0 negative leukocytes negative nitrates  CT abdomen and pelvis showed intra and extrahepatic biliary duct dilatation secondary to a 13  mm stone in the distal common bile duct. Bilateral adrenal masses. The right adrenal lesion is compatible  with an adenoma and a ventral hernia contains only fat.  Past medical history-As per Problem list Chart reviewed as below- reviewed  Consultants:   [facility]  Procedures:  CT scan  Antibiotics:  None currently   Subjective   Doing fair. Had significant lower quadrant pain yesterday p.m. 3 hours after eating Wants increase in his oral  pain meds Not hungry currently Brother at bedside Past a small stoolky here today   Objective    Interim History: none  Telemetry: afib 110-120   Objective: Filed Vitals:   10/08/13 2213 10/09/13 0451 10/09/13 0920 10/09/13 1150  BP: 124/76 97/55 138/87   Pulse: 95 61 107   Temp: 97.9 F (36.6 C) 98.4 F (36.9 C)    TempSrc: Oral Oral    Resp: 20 20    Height:      Weight:    161.435 kg (355 lb 14.4 oz)  SpO2: 100% 100%      Intake/Output Summary (Last 24 hours) at 10/09/13 1412 Last data filed at 10/09/13 1100  Gross per 24 hour  Intake 2863.33 ml  Output   2100 ml  Net 763.33 ml    Exam:  General: eomi, ncat Cardiovascular:  s1 s2 tachy Respiratory: clear, no added sound Abdomen:  Tender Lower quadrant and epigastrium-cannot appreciate organomegally Skin less jaundiced than prior. Neurointact  Data Reviewed: Basic Metabolic Panel:  Recent Labs Lab 10/07/13 1130  10/07/13 1133 10/08/13 0618 10/09/13 0612  NA  --   --  132* 135* 135*  K  --   < > 2.5* 3.1* 3.9  CL  --   --  87* 92* 96  CO2  --   --  26 28 25   GLUCOSE  --   --  122* 98 109*  BUN  --   --  35* 31* 24*  CREATININE  --   --  1.42* 1.21 1.06  CALCIUM  --   --  11.0* 10.4 10.6*  MG 1.6  --   --   --   --   < > =  values in this interval not displayed. Liver Function Tests:  Recent Labs Lab 10/07/13 1133 10/08/13 0618 10/09/13 0612  AST 265* 214* 216*  ALT 389* 295* 291*  ALKPHOS 621* 596* 659*  BILITOT 11.0* 10.4* 11.9*  PROT 7.1 6.3 6.2  ALBUMIN 3.4* 2.9* 2.8*    Recent Labs Lab 10/07/13 1133  LIPASE 35   No results found for this basename: AMMONIA,  in the last 168 hours CBC:  Recent Labs Lab 10/07/13 1133 10/08/13 0618 10/09/13 0612  WBC 6.4 5.5 5.9  HGB 14.8 13.5 13.4  HCT 41.1 38.1* 38.5*  MCV 85.3 86.6 88.1  PLT 233 179 197   Cardiac Enzymes: No results found for this basename: CKTOTAL, CKMB, CKMBINDEX, TROPONINI,  in the last 168 hours BNP: No  components found with this basename: POCBNP,  CBG: No results found for this basename: GLUCAP,  in the last 168 hours  No results found for this or any previous visit (from the past 240 hour(s)).   Studies:              All Imaging reviewed and is as per above notation   Scheduled Meds: . allopurinol  100 mg Oral Daily  . cefTRIAXone (ROCEPHIN)  IV  1 g Intravenous Q24H  . feeding supplement (PRO-STAT SUGAR FREE 64)  30 mL Oral TID WC  . furosemide  20 mg Oral Daily  . metolazone  2.5 mg Oral Daily  . metoprolol tartrate  25 mg Oral BID  . pantoprazole  40 mg Oral Daily  . potassium chloride SA  40 mEq Oral BID   Continuous Infusions: . 0.9 % NaCl with KCl 40 mEq / L 100 mL/hr (10/09/13 0439)     Assessment/Plan:  1. Choledocholithiasis with risk of ascending cholangitis-continue Rocephin 1 g every 24. Appreciate gastroenterology input.  [facility] will accept patient transfer on 10/10/13 for  ERCP. LFT's remain about the same 2. Abd pain-unclear etiology-CT on admit no signs diverticular disease.  Oxycodone for now.  IF recurs will image/and or get Gen surgery opinion 3. Profound hypokalemia-replaced with IV and oral. Currently k=3.9.  Saline discontinued 4. Cor pulmonale + diastolic dysfunction-continue metolazone 2.5 daily, Lasix 20 daily-continue metoprolol 25 twice a day 5. Adrenal masses-per CT scan of  abdomen these are probably benign 6. Chronic kidney disease stage I-stable.  Bun/creat have trended downwards 7. Hypercalcemia-unclear etiology.  Is on metolazone which could contribute.  Will d/c and rpt labs 7/12 8. Constipation-given sorbitol 20 mL-had loose stool 7/11 so use prn Morbid obesity, Body mass index is 51.07 kg/(m^2).-has lost 50 pounds recently. Congratulated. Need pannus surgery at [facility] eventually. 9. Chronic respiratory failure secondary to #3.  Code Status: Full Family Communication: None at bedside Disposition Plan: Inpatient   Pleas Koch, MD  [facility] Pager [phone] 10/09/2013, 2:12 PM    LOS: 2 days

## 2013-10-10 LAB — CBC WITH DIFFERENTIAL/PLATELET
Basophils Absolute: 0 10*3/uL (ref 0.0–0.1)
Basophils Relative: 1 % (ref 0–1)
EOS ABS: 0.2 10*3/uL (ref 0.0–0.7)
Eosinophils Relative: 3 % (ref 0–5)
HEMATOCRIT: 39.1 % (ref 39.0–52.0)
HEMOGLOBIN: 13.4 g/dL (ref 13.0–17.0)
LYMPHS ABS: 0.9 10*3/uL (ref 0.7–4.0)
Lymphocytes Relative: 14 % (ref 12–46)
MCH: 30.5 pg (ref 26.0–34.0)
MCHC: 34.3 g/dL (ref 30.0–36.0)
MCV: 88.9 fL (ref 78.0–100.0)
MONO ABS: 0.5 10*3/uL (ref 0.1–1.0)
MONOS PCT: 7 % (ref 3–12)
NEUTROS ABS: 5.2 10*3/uL (ref 1.7–7.7)
NEUTROS PCT: 75 % (ref 43–77)
Platelets: 205 10*3/uL (ref 150–400)
RBC: 4.4 MIL/uL (ref 4.22–5.81)
RDW: 15.4 % (ref 11.5–15.5)
WBC: 6.8 10*3/uL (ref 4.0–10.5)

## 2013-10-10 LAB — COMPREHENSIVE METABOLIC PANEL
ALK PHOS: 800 U/L — AB (ref 39–117)
ALT: 295 U/L — ABNORMAL HIGH (ref 0–53)
AST: 233 U/L — ABNORMAL HIGH (ref 0–37)
Albumin: 2.8 g/dL — ABNORMAL LOW (ref 3.5–5.2)
Anion gap: 15 (ref 5–15)
BUN: 20 mg/dL (ref 6–23)
CHLORIDE: 95 meq/L — AB (ref 96–112)
CO2: 24 mEq/L (ref 19–32)
Calcium: 11 mg/dL — ABNORMAL HIGH (ref 8.4–10.5)
Creatinine, Ser: 1.02 mg/dL (ref 0.50–1.35)
GFR, EST AFRICAN AMERICAN: 89 mL/min — AB (ref >90)
GFR, EST NON AFRICAN AMERICAN: 77 mL/min — AB (ref >90)
GLUCOSE: 113 mg/dL — AB (ref 70–99)
Potassium: 4.5 mEq/L (ref 3.7–5.3)
Sodium: 134 mEq/L — ABNORMAL LOW (ref 137–147)
Total Bilirubin: 14.1 mg/dL — ABNORMAL HIGH (ref 0.3–1.2)
Total Protein: 6.2 g/dL (ref 6.0–8.3)

## 2013-10-10 LAB — TSH: TSH: 0.418 u[IU]/mL (ref 0.350–4.500)

## 2013-10-10 MED ORDER — MORPHINE SULFATE 2 MG/ML IJ SOLN: 1.0000 mg | INTRAMUSCULAR | Status: DC | PRN

## 2013-10-10 MED ORDER — URSODIOL 300 MG PO CAPS
300.0000 mg | ORAL_CAPSULE | Freq: Every day | ORAL | Status: DC
  Administered 2013-10-10: 300 mg via ORAL
  Filled 2013-10-10 (×2): qty 1

## 2013-10-10 MED ORDER — PIPERACILLIN-TAZOBACTAM 3.375 G IVPB 30 MIN: 3.3750 g | Freq: Three times a day (TID) | INTRAVENOUS | Status: DC

## 2013-10-10 MED ORDER — FLUCONAZOLE IN SODIUM CHLORIDE 200-0.9 MG/100ML-% IV SOLN
200.0000 mg | INTRAVENOUS | Status: DC
  Administered 2013-10-10: 200 mg via INTRAVENOUS
  Filled 2013-10-10 (×2): qty 100

## 2013-10-10 MED ORDER — SODIUM CHLORIDE 0.9 % IV SOLN
INTRAVENOUS | Status: DC
  Administered 2013-10-10: 14:00:00 via INTRAVENOUS

## 2013-10-10 MED ORDER — FLUCONAZOLE IN SODIUM CHLORIDE 200-0.9 MG/100ML-% IV SOLN: 200.0000 mg | INTRAVENOUS | Status: DC

## 2013-10-10 MED ORDER — URSODIOL 300 MG PO CAPS: 300.0000 mg | ORAL_CAPSULE | Freq: Two times a day (BID) | ORAL | Status: DC

## 2013-10-10 MED ORDER — SORBITOL 70 % SOLN: 20.0000 mL | Freq: Every day | Status: DC | PRN

## 2013-10-10 MED ORDER — SODIUM CHLORIDE 0.9 % IV SOLN: 75.0000 mL | INTRAVENOUS | Status: DC

## 2013-10-10 MED ORDER — PIPERACILLIN-TAZOBACTAM 3.375 G IVPB
3.3750 g | Freq: Three times a day (TID) | INTRAVENOUS | Status: DC
  Filled 2013-10-10 (×3): qty 50

## 2013-10-10 MED ORDER — FUROSEMIDE 10 MG/ML IJ SOLN
40.0000 mg | Freq: Once | INTRAMUSCULAR | Status: AC
  Administered 2013-10-10: 40 mg via INTRAVENOUS
  Filled 2013-10-10: qty 4

## 2013-10-10 NOTE — Consult Note (Signed)
Reason for Consult: Hypercalcemia Referring Physician: Dr.Samtani  Frederick Brown is an 63 y.o. male.  HPI: He is a patient has multiple medical problems including history of hypertension, morbid obesity, CHF, chronic respiratory failure presently came with complaints of abdominal pain and nausea. On evaluation patient was found to have obstructive jaundice and presently is going to be transferred to [facility] for further workup and management. Consult is called because of hypercalcemia. Patient denies any history of kidney stone, and a previous history of hypercalcemia. Patient also denies taking any calcium-based supplement of vitamin D.  Past Medical History  Diagnosis Date  . Hypertension   . Obesity, morbid (more than 100 lbs over ideal weight or BMI > 40)   . Degenerative joint disease   . Alcohol abuse   . Umbilical hernia   . Gastroesophageal reflux disease   . Vitamin B12 deficiency 05/22/2011  . Fracture of metatarsal of left foot, closed 05/28/2011  . Fracture of great toe, left, closed 05/28/2011  . Chronic respiratory failure with hypoxia 05/28/2011  . Urticarial rash 05/31/2011    From Cipro  . Cor pulmonale, chronic February 2013  . Obesity hypoventilation syndrome February 2013  . Pickwickian syndrome   . CHF (congestive heart failure)   . A-fib     Past Surgical History  Procedure Laterality Date  . Cholecystectomy    . Knee surgery      Family History  Problem Relation Age of Onset  . Heart attack Father   . Heart failure Father   . Stroke Father   . Hypertension Father     Social History:  reports that he has never smoked. He does not have any smokeless tobacco history on file. He reports that he drinks about .6 ounces of alcohol per week. He reports that he does not use illicit drugs.  Allergies:  Allergies  Allergen Reactions  . Ciprofloxacin Rash    Patient is not aware of the following allergy    Medications: I have reviewed the patient's  current medications.  Results for orders placed during the hospital encounter of 10/07/13 (from the past 48 hour(s))  COMPREHENSIVE METABOLIC PANEL     Status: Abnormal   Collection Time    10/09/13  6:12 AM      Result Value Ref Range   Sodium 135 (*) 137 - 147 mEq/L   Potassium 3.9  3.7 - 5.3 mEq/L   Comment: DELTA CHECK NOTED   Chloride 96  96 - 112 mEq/L   CO2 25  19 - 32 mEq/L   Glucose, Bld 109 (*) 70 - 99 mg/dL   BUN 24 (*) 6 - 23 mg/dL   Creatinine, Ser 1.06  0.50 - 1.35 mg/dL   Calcium 10.6 (*) 8.4 - 10.5 mg/dL   Total Protein 6.2  6.0 - 8.3 g/dL   Albumin 2.8 (*) 3.5 - 5.2 g/dL   AST 216 (*) 0 - 37 U/L   ALT 291 (*) 0 - 53 U/L   Alkaline Phosphatase 659 (*) 39 - 117 U/L   Total Bilirubin 11.9 (*) 0.3 - 1.2 mg/dL   GFR calc non Af Amer 73 (*) >90 mL/min   GFR calc Af Amer 85 (*) >90 mL/min   Comment: (NOTE)     The eGFR has been calculated using the CKD EPI equation.     This calculation has not been validated in all clinical situations.     eGFR's persistently <90 mL/min signify possible Chronic Kidney  Disease.   Anion gap 14  5 - 15  CBC     Status: Abnormal   Collection Time    10/09/13  6:12 AM      Result Value Ref Range   WBC 5.9  4.0 - 10.5 K/uL   RBC 4.37  4.22 - 5.81 MIL/uL   Hemoglobin 13.4  13.0 - 17.0 g/dL   HCT 38.5 (*) 39.0 - 52.0 %   MCV 88.1  78.0 - 100.0 fL   MCH 30.7  26.0 - 34.0 pg   MCHC 34.8  30.0 - 36.0 g/dL   RDW 15.3  11.5 - 15.5 %   Platelets 197  150 - 400 K/uL  PROTIME-INR     Status: None   Collection Time    10/09/13  6:12 AM      Result Value Ref Range   Prothrombin Time 13.2  11.6 - 15.2 seconds   INR 1.00  0.00 - 1.49  CBC WITH DIFFERENTIAL     Status: None   Collection Time    10/10/13  6:36 AM      Result Value Ref Range   WBC 6.8  4.0 - 10.5 K/uL   RBC 4.40  4.22 - 5.81 MIL/uL   Hemoglobin 13.4  13.0 - 17.0 g/dL   HCT 39.1  39.0 - 52.0 %   MCV 88.9  78.0 - 100.0 fL   MCH 30.5  26.0 - 34.0 pg   MCHC 34.3  30.0 -  36.0 g/dL   RDW 15.4  11.5 - 15.5 %   Platelets 205  150 - 400 K/uL   Neutrophils Relative % 75  43 - 77 %   Neutro Abs 5.2  1.7 - 7.7 K/uL   Lymphocytes Relative 14  12 - 46 %   Lymphs Abs 0.9  0.7 - 4.0 K/uL   Monocytes Relative 7  3 - 12 %   Monocytes Absolute 0.5  0.1 - 1.0 K/uL   Eosinophils Relative 3  0 - 5 %   Eosinophils Absolute 0.2  0.0 - 0.7 K/uL   Basophils Relative 1  0 - 1 %   Basophils Absolute 0.0  0.0 - 0.1 K/uL  COMPREHENSIVE METABOLIC PANEL     Status: Abnormal   Collection Time    10/10/13  6:36 AM      Result Value Ref Range   Sodium 134 (*) 137 - 147 mEq/L   Potassium 4.5  3.7 - 5.3 mEq/L   Chloride 95 (*) 96 - 112 mEq/L   CO2 24  19 - 32 mEq/L   Glucose, Bld 113 (*) 70 - 99 mg/dL   BUN 20  6 - 23 mg/dL   Creatinine, Ser 1.02  0.50 - 1.35 mg/dL   Comment: ICTERUS AT THIS LEVEL MAY AFFECT RESULT   Calcium 11.0 (*) 8.4 - 10.5 mg/dL   Total Protein 6.2  6.0 - 8.3 g/dL   Albumin 2.8 (*) 3.5 - 5.2 g/dL   AST 233 (*) 0 - 37 U/L   ALT 295 (*) 0 - 53 U/L   Alkaline Phosphatase 800 (*) 39 - 117 U/L   Total Bilirubin 14.1 (*) 0.3 - 1.2 mg/dL   GFR calc non Af Amer 77 (*) >90 mL/min   GFR calc Af Amer 89 (*) >90 mL/min   Comment: (NOTE)     The eGFR has been calculated using the CKD EPI equation.     This calculation has not been validated in all  clinical situations.     eGFR's persistently <90 mL/min signify possible Chronic Kidney     Disease.   Anion gap 15  5 - 15    No results found.  Review of Systems  Constitutional: Negative for fever and chills.  Respiratory: Negative for shortness of breath.   Cardiovascular: Positive for leg swelling.  Gastrointestinal: Positive for heartburn, nausea, vomiting and abdominal pain.  Neurological: Positive for weakness.   Blood pressure 124/80, pulse 89, temperature 97.4 F (36.3 C), temperature source Oral, resp. rate 20, height '5\' 10"'  (1.778 m), weight 161.435 kg (355 lb 14.4 oz), SpO2 99.00%. Physical Exam   Constitutional: He is oriented to person, place, and time. No distress.  Eyes: No scleral icterus.  Neck: No JVD present.  Cardiovascular: Normal rate.   Respiratory: He has no wheezes. He has no rales.  Decrease breath sound bilaterally  GI: There is tenderness.  Obese, he is some abdominal tenderness, positive bowel sound  Musculoskeletal: He exhibits no edema.  Neurological: He is alert and oriented to person, place, and time.    Assessment/Plan: Problem #1 hypercalcemia: She seems to be a long-standing and chronic. His calcium has been ranging between 10.3-10.7 at least since 2013. Presently his calcium has increased. Patient denies any supplements such as calcium, vitamin D and vitamin A. Since his calcium has been stable for some time the etiology could be due to immobility/primary hyperparathyroidism/familial hypocalciuric hypercalcemia. Patient in general seems to be a symptomatic. Problem #2 hypokalemia: Has corrected Problem #3 hypertension: His blood pressure is reasonably controlled Problem #4 morbid obesity Problem #5 obstructive jaundice Problem #6 history of a trial fibrillation Problem #7 history of diastolic dysfunction/CHF presently patient does not have significant sign of fluid overload. Plan: We'll start patient with normal saline at 75 cc per hour We'll check 25 vitamin D and 1,25 vitamin D level We'll do 24-hour urine for calcium and sodium. Is intact PTH level is low we will consider secondary workup. We'll check his basic metabolic panel in the morning.  Altonio Schwertner S 10/10/2013, 8:47 AM

## 2013-10-10 NOTE — Progress Notes (Signed)
Patient discharged to [facility].

## 2013-10-10 NOTE — Progress Notes (Signed)
Report given to Baird Lyonsasey, Charity fundraiserN at [facility].

## 2013-10-10 NOTE — Progress Notes (Signed)
Subjective; Patient continues to complain of left lower quadrant abdominal pain. He states even feel better if he has a bowel movement. He did have formed stool yesterday morning. He says epigastric pain is mild. He has had lower abdominal pain for at least 4 weeks; he thought he had food poisoning when this pain started. He denies fever or chills. He has been on pain medication at home for chronic back pain.  Objective; BP 124/80  Pulse 89  Temp(Src) 97.4 F (36.3 C) (Oral)  Resp 20  Ht 5\' 10"  (1.778 m)  Wt 355 lb 14.4 oz (161.435 kg)  BMI 51.07 kg/m2  SpO2 99% Patient is alert and appears to be in pain. Conjunctiva is pink. Sclerae is icteric. Abdomen is very large. Bowel sounds are normal. On palpation abdomen is soft with mild tenderness which is more or less generalized without guarding. Liver edge is also tender. He hasn't like a hernia which is reducible.  Lab data; WBC 6.8, H&H 13.4 and 39.1, platelet count 205K Serum sodium 134, potassium 4.5, chloride 95, CO2 45, glucose 113, BUN 20 and creatinine 1.02. Bilirubin 14.1, AP 800, AST 233, ALT 295 and albumin 2.8. Serum calcium 11.0   Assessment; #1. Obstructive jaundice secondary to choledocholithiasis. Bilirubin and alkaline phosphatase are climbing. He could also have underlying fatty liver disease contiguity into cholestasis. Patient is on ceftriaxone as he is at risk for cholangitis or could have low-grade cholangitis. #2. Mild hypercalcemia. Serum calcium has not corrected with hydration. He has been seen by Dr. Fausto SkillernBefakadu of nephrology service and he is checking for hyperparathyroidism. #3. Lower abdominal pain. Patient continues to complain of lower abdominal pain. CT of admission was unremarkable. He's had this pain for at least 4 weeks. He could have panniculitis.  Recommendations; Transfer to [facility] later today for ERCP in am. He could be further evaluated for lower abdominal pain while he is there.

## 2013-10-11 LAB — VITAMIN D 25 HYDROXY (VIT D DEFICIENCY, FRACTURES): VIT D 25 HYDROXY: 43 ng/mL (ref 30–89)

## 2013-10-11 LAB — PARATHYROID HORMONE, INTACT (NO CA): PTH: 58.1 pg/mL (ref 14.0–72.0)

## 2013-10-13 LAB — VITAMIN D 1,25 DIHYDROXY
Vitamin D 1, 25 (OH)2 Total: 43 pg/mL (ref 18–72)
Vitamin D2 1, 25 (OH)2: <8 pg/mL
Vitamin D3 1, 25 (OH)2: 43 pg/mL

## 2013-10-16 LAB — PTH-RELATED PEPTIDE: PTH-related peptide: 13 pg/mL — ABNORMAL LOW (ref 14–27)

## 2014-10-12 ENCOUNTER — Encounter (HOSPITAL_COMMUNITY): Payer: Self-pay | Admitting: Emergency Medicine

## 2014-10-12 ENCOUNTER — Emergency Department (HOSPITAL_COMMUNITY)
Admission: EM | Admit: 2014-10-12 | Discharge: 2014-10-12 | Disposition: A | Discharge status: Other Acute Inpatient Hospital | Payer: Medicare Other | Attending: Emergency Medicine | Admitting: Emergency Medicine

## 2014-10-12 ENCOUNTER — Emergency Department (HOSPITAL_COMMUNITY): Payer: Medicare Other

## 2014-10-12 DIAGNOSIS — E876 Hypokalemia: Secondary | ICD-10-CM

## 2014-10-12 DIAGNOSIS — R531 Weakness: Secondary | ICD-10-CM | POA: Insufficient documentation

## 2014-10-12 LAB — CBC
HCT: 41 % (ref 39.0–52.0)
Hemoglobin: 13.6 g/dL (ref 13.0–17.0)
MCH: 28 pg (ref 26.0–34.0)
MCHC: 33.2 g/dL (ref 30.0–36.0)
MCV: 84.5 fL (ref 78.0–100.0)
Platelets: 226 K/uL (ref 150–400)
RBC: 4.85 MIL/uL (ref 4.22–5.81)
RDW: 15.2 % (ref 11.5–15.5)
WBC: 5.7 K/uL (ref 4.0–10.5)

## 2014-10-12 LAB — COMPREHENSIVE METABOLIC PANEL WITH GFR
ALT: 16 U/L — ABNORMAL LOW (ref 17–63)
AST: 16 U/L (ref 15–41)
Albumin: 3.6 g/dL (ref 3.5–5.0)
Alkaline Phosphatase: 90 U/L (ref 38–126)
Anion gap: 12 (ref 5–15)
BUN: 23 mg/dL — ABNORMAL HIGH (ref 6–20)
CO2: 29 mmol/L (ref 22–32)
Calcium: 10.5 mg/dL — ABNORMAL HIGH (ref 8.9–10.3)
Chloride: 95 mmol/L — ABNORMAL LOW (ref 101–111)
Creatinine, Ser: 1.04 mg/dL (ref 0.61–1.24)
GFR calc Af Amer: >60 mL/min (ref >60)
GFR calc non Af Amer: >60 mL/min (ref >60)
Glucose, Bld: 105 mg/dL — ABNORMAL HIGH (ref 65–99)
Potassium: 2.6 mmol/L — CL (ref 3.5–5.1)
Sodium: 136 mmol/L (ref 135–145)
Total Bilirubin: 0.9 mg/dL (ref 0.3–1.2)
Total Protein: 6.6 g/dL (ref 6.5–8.1)

## 2014-10-12 LAB — URINALYSIS, ROUTINE W REFLEX MICROSCOPIC
Glucose, UA: NEGATIVE mg/dL
HGB URINE DIPSTICK: NEGATIVE
NITRITE: NEGATIVE
Protein, ur: NEGATIVE mg/dL
Specific Gravity, Urine: 1.015 (ref 1.005–1.030)
Urobilinogen, UA: 0.2 mg/dL (ref 0.0–1.0)
pH: 6.5 (ref 5.0–8.0)

## 2014-10-12 LAB — URINE MICROSCOPIC-ADD ON

## 2014-10-12 LAB — I-STAT CG4 LACTIC ACID, ED: Lactic Acid, Venous: 1.7 mmol/L (ref 0.5–2.0)

## 2014-10-12 LAB — LIPASE, BLOOD: Lipase: 20 U/L — ABNORMAL LOW (ref 22–51)

## 2014-10-12 MED ORDER — IOHEXOL 300 MG/ML  SOLN
100.0000 mL | Freq: Once | INTRAMUSCULAR | Status: AC | PRN
  Administered 2014-10-12: 100 mL via INTRAVENOUS

## 2014-10-12 MED ORDER — POTASSIUM CHLORIDE CRYS ER 20 MEQ PO TBCR
60.0000 meq | EXTENDED_RELEASE_TABLET | Freq: Once | ORAL | Status: AC
  Administered 2014-10-12: 60 meq via ORAL
  Filled 2014-10-12: qty 3

## 2014-10-12 MED ORDER — POTASSIUM CHLORIDE 10 MEQ/100ML IV SOLN
10.0000 meq | INTRAVENOUS | Status: AC
  Administered 2014-10-12 (×2): 10 meq via INTRAVENOUS
  Filled 2014-10-12 (×2): qty 100

## 2014-10-12 NOTE — ED Provider Notes (Signed)
CSN: [id number]     Arrival date & time 10/12/14  1128 History   First MD Initiated Contact with Patient 10/12/14 1239     Chief Complaint  Patient presents with  . Abdominal Pain     (Consider location/radiation/quality/duration/timing/severity/associated sxs/prior Treatment) HPI Patient presents with concern of ongoing abdominal pain, nausea, anorexia, bloating, change in bowel movements. Patient has had symptoms for some time, though worse over the past days. Patient had panniculectomy 3 months ago. He has been discharged from his rehabilitation facility for approximately one month. New symptoms over the past few days have developed without change in medication, diet. Pain is not relieved with anything. Concurrently, there is increased drainage from the left lateral abdominal incision. No fever at home. At baseline patient does not walk.  Past Medical History  Diagnosis Date  . Hypertension   . Obesity, morbid (more than 100 lbs over ideal weight or BMI > 40)   . Degenerative joint disease   . Alcohol abuse   . Umbilical hernia   . Gastroesophageal reflux disease   . Vitamin B12 deficiency 05/22/2011  . Fracture of metatarsal of left foot, closed 05/28/2011  . Fracture of great toe, left, closed 05/28/2011  . Chronic respiratory failure with hypoxia 05/28/2011  . Urticarial rash 05/31/2011    From Cipro  . Cor pulmonale, chronic February 2013  . Obesity hypoventilation syndrome February 2013  . Pickwickian syndrome   . CHF (congestive heart failure)   . A-fib    Past Surgical History  Procedure Laterality Date  . Cholecystectomy    . Knee surgery     Family History  Problem Relation Age of Onset  . Heart attack Father   . Heart failure Father   . Stroke Father   . Hypertension Father    History  Substance Use Topics  . Smoking status: Never Smoker   . Smokeless tobacco: Not on file  . Alcohol Use: 0.6 oz/week    1 Shots of liquor per week     Comment: occ     Review of Systems  Constitutional:       Per HPI, otherwise negative  HENT:       Per HPI, otherwise negative  Respiratory:       Per HPI, otherwise negative  Cardiovascular:       Per HPI, otherwise negative  Gastrointestinal: Positive for nausea. Negative for vomiting.  Endocrine:       Negative aside from HPI  Genitourinary:       Neg aside from HPI   Musculoskeletal:       Per HPI, otherwise negative  Skin: Negative.   Neurological: Positive for weakness. Negative for syncope.      Allergies  Ciprofloxacin  Home Medications   Prior to Admission medications   Medication Sig Start Date End Date Taking? Authorizing Provider  acetaminophen (TYLENOL) 500 MG tablet Take 1,000 mg by mouth every 6 (six) hours as needed for mild pain.   Yes Historical Provider, MD  allopurinol (ZYLOPRIM) 100 MG tablet Take 1 tablet (100 mg total) by mouth daily. 06/26/13  Yes Zannie Cove, MD  aspirin 325 MG tablet Take 1 tablet (325 mg total) by mouth daily. Patient taking differently: Take 81 mg by mouth daily.  06/28/13  Yes Zannie Cove, MD  furosemide (LASIX) 40 MG tablet Take 40 mg by mouth daily.   Yes Historical Provider, MD  hydrocortisone cream 1 % Apply 1 application topically 2 (two) times daily as needed.  Apply to rash on arms and back twice daily as needed 10/14/13 10/14/14 Yes Historical Provider, MD  metolazone (ZAROXOLYN) 2.5 MG tablet Take 2.5 mg by mouth daily.   Yes Historical Provider, MD  metoprolol tartrate (LOPRESSOR) 25 MG tablet Take 25 mg by mouth 2 (two) times daily.   Yes Historical Provider, MD  omeprazole (PRILOSEC) 20 MG capsule Take 20 mg by mouth daily as needed (acid reflux).    Yes Historical Provider, MD  oxyCODONE-acetaminophen (PERCOCET) 10-325 MG per tablet Take 1 tablet by mouth daily as needed. For pain. Normally taken at bedtime 05/05/13  Yes Historical Provider, MD  simvastatin (ZOCOR) 10 MG tablet Take 10 mg by mouth daily. 09/28/12  Yes Historical  Provider, MD  fluconazole (DIFLUCAN) 200-0.9 MG/100ML-% IVPB Inject 100 mLs (200 mg total) into the vein daily. Patient not taking: Reported on 10/12/2014 10/10/13   Rhetta MuraJai-Gurmukh Samtani, MD  morphine 2 MG/ML injection Inject 0.5-1 mLs (1-2 mg total) into the vein every 2 (two) hours as needed (Hold For Sedation). Patient not taking: Reported on 10/12/2014 10/10/13   Rhetta MuraJai-Gurmukh Samtani, MD  piperacillin-tazobactam (ZOSYN) 3-0.375 GM/50ML IVPB Inject 50 mLs (3.375 g total) into the vein every 8 (eight) hours. Patient not taking: Reported on 10/12/2014 10/10/13   Rhetta MuraJai-Gurmukh Samtani, MD  sodium chloride 0.9 % infusion Inject 75 mLs into the vein continuous. Patient not taking: Reported on 10/12/2014 10/10/13   Rhetta MuraJai-Gurmukh Samtani, MD  sorbitol 70 % SOLN Take 20 mLs by mouth daily as needed for severe constipation. Patient not taking: Reported on 10/12/2014 10/10/13   Rhetta MuraJai-Gurmukh Samtani, MD  ursodiol (ACTIGALL) 300 MG capsule Take 1 capsule (300 mg total) by mouth 2 (two) times daily. Patient not taking: Reported on 10/12/2014 10/10/13   Rhetta MuraJai-Gurmukh Samtani, MD   BP 113/80 mmHg  Pulse 105  Temp(Src) 97.5 F (36.4 C) (Oral)  Resp 19  Ht 5\' 10"  (1.778 m)  Wt 347 lb (157.398 kg)  BMI 49.79 kg/m2  SpO2 99% Physical Exam  Constitutional: He is oriented to person, place, and time. He appears well-developed. No distress.  Morbidly obese male lying in bed, answering questions appropriately  HENT:  Head: Normocephalic and atraumatic.  Eyes: Conjunctivae and EOM are normal.  Cardiovascular: Normal rate and regular rhythm.   Pulmonary/Chest: Effort normal. No stridor. No respiratory distress.  Abdominal: He exhibits no distension. There is tenderness.    Musculoskeletal: He exhibits no edema.  Neurological: He is alert and oriented to person, place, and time.  Skin: Skin is warm and dry.  Psychiatric: He has a normal mood and affect.  Nursing note and vitals reviewed.   ED Course  Procedures  (including critical care time) Labs Review Labs Reviewed  LIPASE, BLOOD - Abnormal; Notable for the following:    Lipase 20 (*)    All other components within normal limits  COMPREHENSIVE METABOLIC PANEL - Abnormal; Notable for the following:    Potassium 2.6 (*)    Chloride 95 (*)    Glucose, Bld 105 (*)    BUN 23 (*)    Calcium 10.5 (*)    ALT 16 (*)    All other components within normal limits  URINALYSIS, ROUTINE W REFLEX MICROSCOPIC (NOT AT [facility]) - Abnormal; Notable for the following:    Bilirubin Urine SMALL (*)    Ketones, ur TRACE (*)    Leukocytes, UA SMALL (*)    All other components within normal limits  URINE MICROSCOPIC-ADD ON - Abnormal; Notable for the following:  Squamous Epithelial / LPF FEW (*)    Bacteria, UA MANY (*)    All other components within normal limits  CBC  I-STAT CG4 LACTIC ACID, ED    Imaging Review Ct Abdomen Pelvis W Contrast  10/12/2014   CLINICAL DATA:  Post surgery 07/03/2014 post panniculectomy, increasing abdominal pain, bloating, diarrhea, unable to walk  EXAM: CT ABDOMEN AND PELVIS WITH CONTRAST  TECHNIQUE: Multidetector CT imaging of the abdomen and pelvis was performed using the standard protocol following bolus administration of intravenous contrast.  CONTRAST:  OMNIPAQUE IOHEXOL 300 MG/ML  SOLN  COMPARISON:  10/08/2014  FINDINGS: Study is limited by streak artifacts from patient's large body habitus. Sagittal images of the spine shows diffuse osteopenia. Degenerative changes lower thoracic and upper lumbar spine. Facet degenerative changes lower lumbar spine.  Improvement in previous intrahepatic and extrahepatic biliary ductal dilatation. Small intrahepatic pneumobilia. Status postcholecystectomy.  Enhanced liver shows no focal mass. Stable loculated fluid or a small cyst in right hepatic lobe posteriorly is see axial image 21.  There is no small bowel obstruction. Bilateral adrenal adenomas are stable. The pancreas and spleen are  unremarkable. Kidneys are symmetrical in size and enhancement. No hydronephrosis or hydroureter. Again noted laxity of abdominal wall with asymmetric leftward deviation of abdominal content without evidence of a hernia. Previous hernia in lower abdominal wall has resolved. There is extensive linear subcutaneous scarring in lower abdominal wall and pelvic wall without evidence of subcutaneous fluid collection. No subcutaneous abscess. There is no evidence of recurrent hernia.  Delayed renal images shows bilateral renal symmetrical excretion. Bilateral visualized proximal ureter is unremarkable.  There is no small bowel obstruction. No ascites or free air. No adenopathy. Normal appendix is noted in axial image 52. No pericecal inflammation. The terminal ileum is unremarkable. Nonspecific mild thickening of urinary bladder wall. Chronic inflammation cannot be excluded. Prostate gland calcifications. No destructive bony lesions are noted within pelvis.  Degenerative changes bilateral SI joints. Coronal images shows minimal degenerative changes bilateral hip joints.  IMPRESSION: 1. No evidence of acute inflammatory process within abdomen or pelvis. 2. The previous intrahepatic and extrahepatic biliary ductal dilatation has resolved. Small intrahepatic pneumobilia. 3. Stable bilateral adrenal adenomas. 4. No hydronephrosis or hydroureter. 5. No pericecal inflammation. Normal appendix. Again noted laxity of abdominal wall with leftward deviation of abdominal content. There is no evidence of ventral hernia. The previous ventral hernia containing fat has resolved. Extensive linear scarring in noted in subcutaneous fat lower anterior abdominal and pelvic wall post panniculectomy. There is no evidence of recurrent hernia. No subcutaneous abscess. No subcutaneous fluid collection. No abnormal stranding of subcutaneous fat. 6. Degenerative changes thoracolumbar spine. Degenerative changes bilateral SI joints. 7. Nonspecific mild  thickening of urinary bladder wall. Chronic inflammation cannot be excluded.   Electronically Signed   By: Natasha Mead M.D.   On: 10/12/2014 15:15    I discussed patient's case with his primary care physician. We discussed the patient's persistent weakness since the surgery, his nonambulatory status, again since the surgery, and today's largely reassuring results, no evidence for deep space infection, abscess formation. Patient reiterates that he cannot walk, is frustrated at his weakness  I attempted to discuss the patient's case with case management to attempt to obtain assistance for the patient.  Case management services, not currently available. I ordered home health referral, for evaluation at the patient's house.  I discussed patient's case with our hospitalist colleagues with consideration of admission for weakness. Patient does not meet  medical criteria. On repeat examination is in no distress, hemodynamically stable. Atrial fibrillation is persistent, not remarkably tachycardic.    EKG Interpretation  Date/Time:  Wednesday October 12 2014 17:09:29 EDT Ventricular Rate:  115 PR Interval:    QRS Duration: 107 QT Interval:  366 QTC Calculation: 506 R Axis:   -52 Text Interpretation:  Atrial fibrillation LAD, consider LAFB or inferior infarct Low voltage, precordial leads Abnormal R-wave progression, early transition Borderline T abnormalities, anterior leads Prolonged QT interval Atrial fibrillation Artifact Low voltage QRS Left axis deviation Confirmed by Gerhard Munch  MD [phone]) on 10/12/2014 5:18:32 PM         MDM  This patient, with morbid obesity, chronic weakness, panniculectomy 3 months ago now presents with concern for the aforementioned weakness, as well as drainage from the wound. Here the patient is afebrile, hemodynamically stable, awake, alert, for hours of monitoring. No evidence for bacteremia, sepsis, no evidence for new neurologic deficiencies, though the  patient states his pain and weakness prohibiting him from walking at home. No evidence for deep space infection. After conversations with the patient's primary care physician, and our hospitalist colleagues here, and arranging for additional home health evaluation, patient was discharged.  Gerhard Munch, MD 10/12/14 [id number]

## 2014-10-12 NOTE — ED Notes (Signed)
Patient with no complaints at this time. Respirations even and unlabored. Skin warm/dry. Discharge instructions reviewed with patient at this time. Patient given opportunity to voice concerns/ask questions. IV removed per policy and band-aid applied to site. Patient discharged at this time and left Emergency Department with EMS for transport home.

## 2014-10-12 NOTE — ED Notes (Signed)
[place]. Pt lying on stretcher resting quietly. No distress. Awaiting bed assignment, disposition from EDP. EDP at bedside. Call bell within reach. Stretcher low. Pt requesting Bariatric Bed for admission, notified AC. VSS. A Fib (chronic) on Cardiac Monitor.

## 2014-10-12 NOTE — ED Notes (Signed)
Patient had surgery 07/03/14 for panniculectomy. Was at [facility] for rehab for 40 days. Patient reporting increased abdominal pain, bloating, and diarrhea after eating. Poor appetite and nutrition. Patient unable to walk at home and has home health for assistance. Morbidly obese.

## 2014-10-12 NOTE — ED Notes (Signed)
Pt being sent from PCP.  Had a panus removal in February and was d/c from [facility] during rehab d/t insurance.  Has been FTT since.  Purulent drainage noted from surgical site.

## 2014-10-12 NOTE — Discharge Instructions (Signed)
As discussed, though you are week, today's evaluation does not demonstrate acute new dangerous findings, including no evidence for wound infection, abscess.  You did have low potassium, though this has been repleted.  Return here for concerning changes your condition, otherwise be sure to follow-up with both your primary care physician and your surgeon tomorrow via telephone to arrange appropriate ongoing care. Arrangements have been made to evaluate your situation for additional resources, including additional home health services.  He will be contacted with further instructions.

## 2014-10-12 NOTE — ED Notes (Signed)
Critical value potassium 2.6 reported to Dr. Jeraldine LootsLockwood.

## 2015-01-17 ENCOUNTER — Emergency Department (HOSPITAL_COMMUNITY): Payer: Medicare Other

## 2015-01-17 ENCOUNTER — Inpatient Hospital Stay (HOSPITAL_COMMUNITY)
Admission: EM | Admit: 2015-01-17 | Discharge: 2015-01-20 | DRG: 690 | Disposition: A | Discharge status: Skilled Nursing Facility | Payer: Medicare Other | Attending: Internal Medicine | Admitting: Internal Medicine

## 2015-01-17 ENCOUNTER — Encounter (HOSPITAL_COMMUNITY): Payer: Self-pay

## 2015-01-17 DIAGNOSIS — I482 Chronic atrial fibrillation: Secondary | ICD-10-CM | POA: Diagnosis present

## 2015-01-17 DIAGNOSIS — N1330 Unspecified hydronephrosis: Secondary | ICD-10-CM

## 2015-01-17 DIAGNOSIS — M109 Gout, unspecified: Secondary | ICD-10-CM | POA: Diagnosis present

## 2015-01-17 DIAGNOSIS — I10 Essential (primary) hypertension: Secondary | ICD-10-CM | POA: Diagnosis not present

## 2015-01-17 DIAGNOSIS — Z823 Family history of stroke: Secondary | ICD-10-CM | POA: Diagnosis not present

## 2015-01-17 DIAGNOSIS — R52 Pain, unspecified: Secondary | ICD-10-CM

## 2015-01-17 DIAGNOSIS — N201 Calculus of ureter: Secondary | ICD-10-CM

## 2015-01-17 DIAGNOSIS — R5081 Fever presenting with conditions classified elsewhere: Secondary | ICD-10-CM | POA: Diagnosis not present

## 2015-01-17 DIAGNOSIS — E860 Dehydration: Secondary | ICD-10-CM | POA: Diagnosis present

## 2015-01-17 DIAGNOSIS — R1084 Generalized abdominal pain: Secondary | ICD-10-CM | POA: Diagnosis not present

## 2015-01-17 DIAGNOSIS — R29898 Other symptoms and signs involving the musculoskeletal system: Secondary | ICD-10-CM

## 2015-01-17 DIAGNOSIS — I509 Heart failure, unspecified: Secondary | ICD-10-CM | POA: Diagnosis present

## 2015-01-17 DIAGNOSIS — K219 Gastro-esophageal reflux disease without esophagitis: Secondary | ICD-10-CM | POA: Diagnosis present

## 2015-01-17 DIAGNOSIS — M25522 Pain in left elbow: Secondary | ICD-10-CM | POA: Diagnosis present

## 2015-01-17 DIAGNOSIS — Z7982 Long term (current) use of aspirin: Secondary | ICD-10-CM

## 2015-01-17 DIAGNOSIS — N12 Tubulo-interstitial nephritis, not specified as acute or chronic: Principal | ICD-10-CM

## 2015-01-17 DIAGNOSIS — N132 Hydronephrosis with renal and ureteral calculous obstruction: Secondary | ICD-10-CM

## 2015-01-17 DIAGNOSIS — N390 Urinary tract infection, site not specified: Secondary | ICD-10-CM | POA: Diagnosis present

## 2015-01-17 DIAGNOSIS — Z6841 Body Mass Index (BMI) 40.0 and over, adult: Secondary | ICD-10-CM | POA: Diagnosis not present

## 2015-01-17 DIAGNOSIS — R112 Nausea with vomiting, unspecified: Secondary | ICD-10-CM | POA: Diagnosis not present

## 2015-01-17 DIAGNOSIS — Z8249 Family history of ischemic heart disease and other diseases of the circulatory system: Secondary | ICD-10-CM

## 2015-01-17 DIAGNOSIS — R509 Fever, unspecified: Secondary | ICD-10-CM | POA: Diagnosis present

## 2015-01-17 DIAGNOSIS — I110 Hypertensive heart disease with heart failure: Secondary | ICD-10-CM | POA: Diagnosis present

## 2015-01-17 LAB — CBC WITH DIFFERENTIAL/PLATELET
BASOS ABS: 0 10*3/uL (ref 0.0–0.1)
Basophils Relative: 0 %
Eosinophils Absolute: 0 10*3/uL (ref 0.0–0.7)
Eosinophils Relative: 0 %
HEMATOCRIT: 39.6 % (ref 39.0–52.0)
HEMOGLOBIN: 13.2 g/dL (ref 13.0–17.0)
Lymphocytes Relative: 9 %
Lymphs Abs: 0.4 10*3/uL — ABNORMAL LOW (ref 0.7–4.0)
MCH: 30.1 pg (ref 26.0–34.0)
MCHC: 33.3 g/dL (ref 30.0–36.0)
MCV: 90.4 fL (ref 78.0–100.0)
Monocytes Absolute: 0.3 10*3/uL (ref 0.1–1.0)
Monocytes Relative: 6 %
NEUTROS ABS: 4.3 10*3/uL (ref 1.7–7.7)
Neutrophils Relative %: 85 %
PLATELETS: 197 10*3/uL (ref 150–400)
RBC: 4.38 MIL/uL (ref 4.22–5.81)
RDW: 14.9 % (ref 11.5–15.5)
WBC: 5 10*3/uL (ref 4.0–10.5)

## 2015-01-17 LAB — URINALYSIS, ROUTINE W REFLEX MICROSCOPIC
GLUCOSE, UA: NEGATIVE mg/dL
Ketones, ur: NEGATIVE mg/dL
Nitrite: NEGATIVE
PROTEIN: 30 mg/dL — AB
Specific Gravity, Urine: 1.015 (ref 1.005–1.030)
Urobilinogen, UA: 1 mg/dL (ref 0.0–1.0)
pH: 7 (ref 5.0–8.0)

## 2015-01-17 LAB — COMPREHENSIVE METABOLIC PANEL
ALK PHOS: 142 U/L — AB (ref 38–126)
ALT: 49 U/L (ref 17–63)
AST: 46 U/L — AB (ref 15–41)
Albumin: 3 g/dL — ABNORMAL LOW (ref 3.5–5.0)
Anion gap: 11 (ref 5–15)
BILIRUBIN TOTAL: 0.7 mg/dL (ref 0.3–1.2)
BUN: 16 mg/dL (ref 6–20)
CALCIUM: 10.3 mg/dL (ref 8.9–10.3)
CHLORIDE: 102 mmol/L (ref 101–111)
CO2: 25 mmol/L (ref 22–32)
CREATININE: 0.83 mg/dL (ref 0.61–1.24)
GFR calc Af Amer: >60 mL/min (ref >60)
Glucose, Bld: 105 mg/dL — ABNORMAL HIGH (ref 65–99)
Potassium: 3.9 mmol/L (ref 3.5–5.1)
Sodium: 138 mmol/L (ref 135–145)
Total Protein: 7.1 g/dL (ref 6.5–8.1)

## 2015-01-17 LAB — URINE MICROSCOPIC-ADD ON

## 2015-01-17 LAB — LIPASE, BLOOD: Lipase: 22 U/L (ref 22–51)

## 2015-01-17 MED ORDER — SIMVASTATIN 10 MG PO TABS
10.0000 mg | ORAL_TABLET | Freq: Every day | ORAL | Status: DC
  Administered 2015-01-17 – 2015-01-19 (×3): 10 mg via ORAL
  Filled 2015-01-17 (×3): qty 1

## 2015-01-17 MED ORDER — METOPROLOL TARTRATE 25 MG PO TABS
25.0000 mg | ORAL_TABLET | Freq: Two times a day (BID) | ORAL | Status: DC
  Administered 2015-01-17 – 2015-01-19 (×4): 25 mg via ORAL
  Filled 2015-01-17 (×6): qty 1

## 2015-01-17 MED ORDER — FUROSEMIDE 40 MG PO TABS
40.0000 mg | ORAL_TABLET | Freq: Every day | ORAL | Status: DC
  Administered 2015-01-17 – 2015-01-20 (×4): 40 mg via ORAL
  Filled 2015-01-17 (×4): qty 1

## 2015-01-17 MED ORDER — ONDANSETRON HCL 4 MG PO TABS: 4.0000 mg | ORAL_TABLET | Freq: Four times a day (QID) | ORAL | Status: DC | PRN

## 2015-01-17 MED ORDER — ASPIRIN EC 81 MG PO TBEC
81.0000 mg | DELAYED_RELEASE_TABLET | Freq: Every day | ORAL | Status: DC
  Administered 2015-01-17 – 2015-01-20 (×4): 81 mg via ORAL
  Filled 2015-01-17 (×4): qty 1

## 2015-01-17 MED ORDER — PANTOPRAZOLE SODIUM 40 MG PO TBEC
40.0000 mg | DELAYED_RELEASE_TABLET | Freq: Every day | ORAL | Status: DC
  Administered 2015-01-17 – 2015-01-20 (×4): 40 mg via ORAL
  Filled 2015-01-17 (×4): qty 1

## 2015-01-17 MED ORDER — TRAZODONE HCL 50 MG PO TABS
50.0000 mg | ORAL_TABLET | Freq: Every day | ORAL | Status: DC
  Administered 2015-01-17 – 2015-01-19 (×3): 50 mg via ORAL
  Filled 2015-01-17 (×3): qty 1

## 2015-01-17 MED ORDER — OXYCODONE-ACETAMINOPHEN 10-325 MG PO TABS
1.0000 | ORAL_TABLET | Freq: Four times a day (QID) | ORAL | Status: DC | PRN
Start: 2015-01-17 — End: 2015-01-17

## 2015-01-17 MED ORDER — PANTOPRAZOLE SODIUM 40 MG IV SOLR
40.0000 mg | Freq: Once | INTRAVENOUS | Status: AC
  Administered 2015-01-17: 40 mg via INTRAVENOUS
  Filled 2015-01-17: qty 40

## 2015-01-17 MED ORDER — ONDANSETRON HCL 4 MG/2ML IJ SOLN: 4.0000 mg | Freq: Four times a day (QID) | INTRAMUSCULAR | Status: DC | PRN

## 2015-01-17 MED ORDER — DEXTROSE 5 % IV SOLN
1.0000 g | Freq: Once | INTRAVENOUS | Status: AC
  Administered 2015-01-18: 1 g via INTRAVENOUS
  Filled 2015-01-17: qty 10

## 2015-01-17 MED ORDER — TAMSULOSIN HCL 0.4 MG PO CAPS
0.4000 mg | ORAL_CAPSULE | Freq: Every day | ORAL | Status: DC
  Administered 2015-01-17 – 2015-01-20 (×4): 0.4 mg via ORAL
  Filled 2015-01-17 (×4): qty 1

## 2015-01-17 MED ORDER — METOLAZONE 5 MG PO TABS
2.5000 mg | ORAL_TABLET | Freq: Two times a day (BID) | ORAL | Status: DC
Start: 2015-01-17 — End: 2015-01-20
  Administered 2015-01-17 – 2015-01-20 (×6): 2.5 mg via ORAL
  Filled 2015-01-17 (×6): qty 1

## 2015-01-17 MED ORDER — DILTIAZEM HCL 30 MG PO TABS
30.0000 mg | ORAL_TABLET | Freq: Once | ORAL | Status: AC
  Administered 2015-01-17: 30 mg via ORAL
  Filled 2015-01-17: qty 1

## 2015-01-17 MED ORDER — SIMVASTATIN 10 MG PO TABS: 10.0000 mg | ORAL_TABLET | Freq: Every day | ORAL | Status: DC

## 2015-01-17 MED ORDER — SODIUM CHLORIDE 0.9 % IV SOLN
INTRAVENOUS | Status: DC
  Administered 2015-01-17 – 2015-01-20 (×7): via INTRAVENOUS

## 2015-01-17 MED ORDER — ACETAMINOPHEN 325 MG PO TABS
650.0000 mg | ORAL_TABLET | Freq: Once | ORAL | Status: AC
  Administered 2015-01-17: 650 mg via ORAL
  Filled 2015-01-17: qty 2

## 2015-01-17 MED ORDER — OXYCODONE HCL 5 MG PO TABS
5.0000 mg | ORAL_TABLET | Freq: Four times a day (QID) | ORAL | Status: DC | PRN
Start: 2015-01-17 — End: 2015-01-20
  Administered 2015-01-17 – 2015-01-19 (×3): 5 mg via ORAL
  Filled 2015-01-17 (×3): qty 1

## 2015-01-17 MED ORDER — HEPARIN SODIUM (PORCINE) 5000 UNIT/ML IJ SOLN
5000.0000 [IU] | Freq: Three times a day (TID) | INTRAMUSCULAR | Status: DC
  Administered 2015-01-17 – 2015-01-20 (×6): 5000 [IU] via SUBCUTANEOUS
  Filled 2015-01-17 (×6): qty 1

## 2015-01-17 MED ORDER — DEXTROSE 5 % IV SOLN
1.0000 g | Freq: Once | INTRAVENOUS | Status: AC
  Administered 2015-01-17: 1 g via INTRAVENOUS
  Filled 2015-01-17: qty 10

## 2015-01-17 MED ORDER — POTASSIUM CHLORIDE CRYS ER 10 MEQ PO TBCR
10.0000 meq | EXTENDED_RELEASE_TABLET | Freq: Every day | ORAL | Status: DC
  Administered 2015-01-17 – 2015-01-20 (×4): 10 meq via ORAL
  Filled 2015-01-17 (×4): qty 1

## 2015-01-17 MED ORDER — OXYCODONE-ACETAMINOPHEN 5-325 MG PO TABS
1.0000 | ORAL_TABLET | Freq: Four times a day (QID) | ORAL | Status: DC | PRN
  Administered 2015-01-17 – 2015-01-19 (×3): 1 via ORAL
  Filled 2015-01-17 (×3): qty 1

## 2015-01-17 MED ORDER — ALLOPURINOL 300 MG PO TABS
300.0000 mg | ORAL_TABLET | Freq: Every day | ORAL | Status: DC
  Administered 2015-01-18 – 2015-01-20 (×3): 300 mg via ORAL
  Filled 2015-01-17 (×3): qty 1

## 2015-01-17 MED ORDER — SODIUM CHLORIDE 0.9 % IV BOLUS (SEPSIS)
500.0000 mL | Freq: Once | INTRAVENOUS | Status: AC
  Administered 2015-01-17: 500 mL via INTRAVENOUS

## 2015-01-17 NOTE — Consult Note (Signed)
I spoke to the emergency room physician in regards to this patient.  He is a morbidly obese, otherwise unwell patient who presented to the emergency department with low-grade fever, and diffuse pain.  A CT scan demonstrated 2 good sized stones in the right mid ureter with proximal hydronephrosis.  The patient was noted to have a low-grade fever.  He had no elevation in his white blood cell count.  His renal function was baseline.  His urine demonstrated pyuria, no nitrites.  Given that there is no anesthesiologist at night to cover the operating room and this patient's habitus and potential for difficult airway, I would recommend that he be admitted for observation and placed on broad-spectrum antibiotics.  He should be made nothing by mouth past midnight and plan for a stent to be placed tomorrow.  If the patient deteriorates, or needs surgery tonight he would need [facility].

## 2015-01-17 NOTE — ED Notes (Signed)
Per EMS, called out for pt vomiting coffee ground material once last night.

## 2015-01-17 NOTE — ED Provider Notes (Signed)
CSN: [id number]     Arrival date & time 01/17/15  1503 History   First MD Initiated Contact with Patient 01/17/15 1507     Chief Complaint  Patient presents with  . Abdominal Pain     (Consider location/radiation/quality/duration/timing/severity/associated sxs/prior Treatment) Patient is a 64 y.o. male presenting with abdominal pain. The history is provided by the patient ([place] complains of fever weakness mild abdominal discomfort for couple days).  Abdominal Pain Pain location:  Generalized Pain quality: aching   Pain radiates to:  Does not radiate Pain severity:  Mild Onset quality:  Sudden Timing:  Intermittent Chronicity:  New Context: not alcohol use   Associated symptoms: fever   Associated symptoms: no chest pain, no cough, no diarrhea, no fatigue and no hematuria     Past Medical History  Diagnosis Date  . Hypertension   . Obesity, morbid (more than 100 lbs over ideal weight or BMI > 40) (HCC)   . Degenerative joint disease   . Alcohol abuse   . Umbilical hernia   . Gastroesophageal reflux disease   . Vitamin B12 deficiency 05/22/2011  . Fracture of metatarsal of left foot, closed 05/28/2011  . Fracture of great toe, left, closed 05/28/2011  . Chronic respiratory failure with hypoxia (HCC) 05/28/2011  . Urticarial rash 05/31/2011    From Cipro  . Cor pulmonale, chronic [facility](HCC) February 2013  . Obesity hypoventilation syndrome [facility](HCC) February 2013  . Pickwickian syndrome (HCC)   . CHF (congestive heart failure) (HCC)   . A-fib [facility](HCC)    Past Surgical History  Procedure Laterality Date  . Cholecystectomy    . Knee surgery     Family History  Problem Relation Age of Onset  . Heart attack Father   . Heart failure Father   . Stroke Father   . Hypertension Father    Social History  Substance Use Topics  . Smoking status: Never Smoker   . Smokeless tobacco: None  . Alcohol Use: 0.6 oz/week    1 Shots of liquor per week     Comment: occ    Review of Systems   Constitutional: Positive for fever. Negative for appetite change and fatigue.  HENT: Negative for congestion, ear discharge and sinus pressure.   Eyes: Negative for discharge.  Respiratory: Negative for cough.   Cardiovascular: Negative for chest pain.  Gastrointestinal: Positive for abdominal pain. Negative for diarrhea.  Genitourinary: Negative for frequency and hematuria.  Musculoskeletal: Negative for back pain.  Skin: Negative for rash.  Neurological: Negative for seizures and headaches.  Psychiatric/Behavioral: Negative for hallucinations.      Allergies  Ciprofloxacin  Home Medications   Prior to Admission medications   Medication Sig Start Date End Date Taking? Authorizing Provider  acetaminophen (TYLENOL) 500 MG tablet Take 1,000 mg by mouth every 6 (six) hours as needed for mild pain.   Yes Historical Provider, MD  allopurinol (ZYLOPRIM) 300 MG tablet Take 300 mg by mouth daily.   Yes Historical Provider, MD  aspirin EC 81 MG tablet Take 81 mg by mouth daily.   Yes Historical Provider, MD  COLCRYS 0.6 MG tablet Take 1 tablet by mouth every 12 (twelve) hours as needed (for gout).  01/16/15  Yes Historical Provider, MD  furosemide (LASIX) 40 MG tablet Take 40 mg by mouth daily.   Yes Historical Provider, MD  metolazone (ZAROXOLYN) 2.5 MG tablet Take 2.5 mg by mouth 2 (two) times daily.    Yes Historical Provider, MD  metoprolol tartrate (  LOPRESSOR) 25 MG tablet Take 25 mg by mouth 2 (two) times daily.   Yes Historical Provider, MD  omeprazole (PRILOSEC) 20 MG capsule Take 20 mg by mouth every other day.    Yes Historical Provider, MD  oxyCODONE-acetaminophen (PERCOCET) 10-325 MG per tablet Take 1 tablet by mouth daily as needed. For pain. Normally taken at bedtime 05/05/13  Yes Historical Provider, MD  potassium chloride (MICRO-K) 10 MEQ CR capsule Take 10 mEq by mouth daily.   Yes Historical Provider, MD  simvastatin (ZOCOR) 10 MG tablet Take 10 mg by mouth daily. 09/28/12   Yes Historical Provider, MD  traZODone (DESYREL) 50 MG tablet Take 50 mg by mouth at bedtime.   Yes Historical Provider, MD   BP 100/82 mmHg  Pulse 107  Temp(Src) 99.3 F (37.4 C)  Resp 14  Ht  (1.778 m)  Wt 347 lb (157.398 kg)  BMI 49.79 kg/m2  SpO2 98% Physical Exam  Constitutional: He is oriented to person, place, and time. He appears well-developed.  Obese   HENT:  Head: Normocephalic.  Eyes: Conjunctivae and EOM are normal. No scleral icterus.  Neck: Neck supple. No thyromegaly present.  Cardiovascular: Normal rate and regular rhythm.  Exam reveals no gallop and no friction rub.   No murmur heard. Pulmonary/Chest: No stridor. He has no wheezes. He has no rales. He exhibits no tenderness.  Abdominal: He exhibits no distension. There is tenderness. There is no rebound.  Mild tenderness throughout  Musculoskeletal: Normal range of motion. He exhibits no edema.  Lymphadenopathy:    He has no cervical adenopathy.  Neurological: He is oriented to person, place, and time. He exhibits normal muscle tone. Coordination normal.  Skin: No rash noted. No erythema.  Psychiatric: He has a normal mood and affect. His behavior is normal.    ED Course  Procedures (including critical care time) Labs Review Labs Reviewed  CBC WITH DIFFERENTIAL/PLATELET - Abnormal; Notable for the following:    Lymphs Abs 0.4 (*)    All other components within normal limits  COMPREHENSIVE METABOLIC PANEL - Abnormal; Notable for the following:    Glucose, Bld 105 (*)    Albumin 3.0 (*)    AST 46 (*)    Alkaline Phosphatase 142 (*)    All other components within normal limits  URINALYSIS, ROUTINE W REFLEX MICROSCOPIC (NOT AT [facility] - Abnormal; Notable for the following:    APPearance HAZY (*)    Hgb urine dipstick SMALL (*)    Bilirubin Urine SMALL (*)    Protein, ur 30 (*)    Leukocytes, UA LARGE (*)    All other components within normal limits  URINE MICROSCOPIC-ADD ON - Abnormal; Notable for  the following:    Squamous Epithelial / LPF FEW (*)    Bacteria, UA MANY (*)    All other components within normal limits  URINE CULTURE  LIPASE, BLOOD    Imaging Review Dg Chest Portable 1 View  01/17/2015  CLINICAL DATA:  Coffee-ground emesis. Mild shortness of breath. Pain. EXAM: PORTABLE CHEST 1 VIEW COMPARISON:  10/07/2013 FINDINGS: Chronic cardiopericardial enlargement. Unchanged aortic and hilar contours when accounting for rotation. Right infrahilar density is likely vessels, accentuated by rotation. There is no edema, consolidation, effusion, or pneumothorax. Chronic diaphragm flattening. IMPRESSION: No acute finding. Electronically Signed   By: Marnee Spring M.D.   On: 01/17/2015 15:48   Ct Renal Stone Study  01/17/2015  CLINICAL DATA:  Abdominal pain, hurts all over EXAM: CT ABDOMEN  AND PELVIS WITHOUT CONTRAST TECHNIQUE: Multidetector CT imaging of the abdomen and pelvis was performed following the standard protocol without IV contrast. COMPARISON:  10/12/2014 FINDINGS: There is diffuse osteopenia. Degenerative changes are noted thoracic and lumbar spine. There are streaky artifacts from patient's large body habitus. The lung bases are unremarkable. There is intrahepatic pneumobilia probable post cholecystectomy. The patient is status postcholecystectomy. Some air noted within CBD. Unenhanced pancreas and spleen is unremarkable. Bilateral adrenal adenomas again noted. Atherosclerotic calcifications of abdominal aorta and iliac arteries are noted. No aortic aneurysm. There is nonobstructive calculus in midpole of the right kidney measures 5 mm. Nonobstructive calcification in midpole of the left kidney measures 9 mm. There is mild right hydronephrosis and right hydroureter. Significant right perinephric stranding. Nonobstructive calculus in right renal pelvis measures 7 mm. Axial image 61 there is calcified obstructive calculus in mid right ureter measures 6 mm at the level of L5-S1 disc  space. There is a second obstructive calculus in right ureter at the same level measures 6 mm. Best visualized in coronal image 51. There is no small bowel obstruction. No ascites or free air. No adenopathy. Moderate stool noted in sigmoid colon. Moderate stool and gas noted within transverse colon. No pericecal inflammation. Normal appendix. The terminal ileum is unremarkable. The urinary bladder is empty limiting its assessment. Bilateral distal ureter is unremarkable. IMPRESSION: 1. There is bilateral nonobstructive nephrolithiasis. 2. Mild right hydronephrosis and right hydroureter. 3. Two adjacent obstructive calculi are noted in mid right ureter at L5-S1 level. The largest measures 6 mm. 4. Stable chronic pneumobilia probable post cholecystectomy. 5. Stable bilateral adrenal adenomas. 6. Limited assessment of urinary bladder which is empty. Electronically Signed   By: Natasha Mead M.D.   On: 01/17/2015 17:10   I have personally reviewed and evaluated these images and lab results as part of my medical decision-making.   EKG Interpretation   Date/Time:  Tuesday January 17 2015 15:15:39 EDT Ventricular Rate:  126 PR Interval:    QRS Duration: 99 QT Interval:  329 QTC Calculation: 476 R Axis:   -37 Text Interpretation:  Atrial fibrillation Left axis deviation Probable  anterior infarct, age indeterminate Confirmed by Kamel Haven  MD, Elycia Woodside  [phone]) on 01/17/2015 6:22:33 PM      MDM   Final diagnoses:  Pain  UTI (lower urinary tract infection)    Admitted for UTI and kidney stones. Patient started on Rocephin    Bethann Berkshire, MD 01/17/15 Rickey Primus

## 2015-01-17 NOTE — H&P (Signed)
[facility] History and Physical  Frederick CanterburyJames H Neisen ZOX:[id number] DOB: 1951-03-06 DOA: 01/17/2015  Referring physician: Dr. Estell HarpinZammit - [facility] PCP: Trinna PostKOBERLEIN, JUNELL CAROL, MD   Chief Complaint: General ill feeling and fevers  HPI: Frederick Brown is a 64 y.o. male  General ill feeling and intermittent fevers for the last 3-4 days. Febrile to 101/102. Emesis 1 shortly after ingestion of coffee couple of days ago. Intermittent dysuria. Denies hematuria or back pain. Tylenol without improvement. Decreased oral intake over this period time. Symptoms are constant and getting worse.  Review of Systems:  Constitutional:  No weight loss, night sweats,  HEENT:  No headaches, Difficulty swallowing,Tooth/dental problems,Sore throat, Cardio-vascular:  No chest pain, Orthopnea, PND, swelling in lower extremities, anasarca, dizziness, palpitations  GI: Per HPi Resp:   No shortness of breath with exertion or at rest. No excess mucus, no productive cough, No non-productive cough, No coughing up of blood.No change in color of mucus.No wheezing.No chest wall deformity  Skin:  no rash or lesions.  GU:  P{er HPI Musculoskeletal:   No joint pain or swelling. No decreased range of motion. No back pain.  Psych:  No change in mood or affect. No depression or anxiety. No memory loss.  Neuro:  No change in sensation, unilateral strength, or cognitive abilities  All other systems were reviewed and are negative.  Past Medical History  Diagnosis Date  . Hypertension   . Obesity, morbid (more than 100 lbs over ideal weight or BMI > 40) (HCC)   . Degenerative joint disease   . Alcohol abuse   . Umbilical hernia   . Gastroesophageal reflux disease   . Vitamin B12 deficiency 05/22/2011  . Fracture of metatarsal of left foot, closed 05/28/2011  . Fracture of great toe, left, closed 05/28/2011  . Chronic respiratory failure with hypoxia (HCC) 05/28/2011  . Urticarial rash 05/31/2011    From Cipro  . Cor  pulmonale, chronic [facility](HCC) February 2013  . Obesity hypoventilation syndrome [facility](HCC) February 2013  . Pickwickian syndrome (HCC)   . CHF (congestive heart failure) (HCC)   . A-fib [facility](HCC)    Past Surgical History  Procedure Laterality Date  . Cholecystectomy    . Knee surgery     Social History:  reports that he has never smoked. He does not have any smokeless tobacco history on file. He reports that he drinks about 0.6 oz of alcohol per week. He reports that he does not use illicit drugs.  Allergies  Allergen Reactions  . Ciprofloxacin Rash    Patient is not aware of the following allergy    Family History  Problem Relation Age of Onset  . Heart attack Father   . Heart failure Father   . Stroke Father   . Hypertension Father      Prior to Admission medications   Medication Sig Start Date End Date Taking? Authorizing Provider  acetaminophen (TYLENOL) 500 MG tablet Take 1,000 mg by mouth every 6 (six) hours as needed for mild pain.   Yes Historical Provider, MD  allopurinol (ZYLOPRIM) 300 MG tablet Take 300 mg by mouth daily.   Yes Historical Provider, MD  aspirin EC 81 MG tablet Take 81 mg by mouth daily.   Yes Historical Provider, MD  COLCRYS 0.6 MG tablet Take 1 tablet by mouth every 12 (twelve) hours as needed (for gout).  01/16/15  Yes Historical Provider, MD  furosemide (LASIX) 40 MG tablet Take 40 mg by mouth daily.   Yes Historical  Provider, MD  metolazone (ZAROXOLYN) 2.5 MG tablet Take 2.5 mg by mouth 2 (two) times daily.    Yes Historical Provider, MD  metoprolol tartrate (LOPRESSOR) 25 MG tablet Take 25 mg by mouth 2 (two) times daily.   Yes Historical Provider, MD  omeprazole (PRILOSEC) 20 MG capsule Take 20 mg by mouth every other day.    Yes Historical Provider, MD  oxyCODONE-acetaminophen (PERCOCET) 10-325 MG per tablet Take 1 tablet by mouth daily as needed. For pain. Normally taken at bedtime 05/05/13  Yes Historical Provider, MD  potassium chloride (MICRO-K) 10 MEQ CR  capsule Take 10 mEq by mouth daily.   Yes Historical Provider, MD  simvastatin (ZOCOR) 10 MG tablet Take 10 mg by mouth daily. 09/28/12  Yes Historical Provider, MD  traZODone (DESYREL) 50 MG tablet Take 50 mg by mouth at bedtime.   Yes Historical Provider, MD   Physical Exam: Filed Vitals:   01/17/15 1704 01/17/15 1730 01/17/15 1800 01/17/15 1830  BP:  103/64 100/82 103/62  Pulse:  118 107 112  Temp: 99.3 F (37.4 C)     Resp:  Height:      Weight:      SpO2:  95% 98% 98%    Wt Readings from Last 3 Encounters:  01/17/15 157.398 kg (347 lb)  10/12/14 157.398 kg (347 lb)  10/09/13 161.435 kg (355 lb 14.4 oz)    General:  Appears calm and comfortable Eyes:  PERRL, EOMI, normal lids, iris ENT:  grossly normal hearing, lips & tongue Neck:  no LAD, masses or thyromegaly Cardiovascular:  RRR, no m/r/g. No LE edema.  Respiratory:  CTA bilaterally, no w/r/r. Normal respiratory effort. Abdomen:  Morbidly obese, intermittent right mild dental tenderness to palpation, difficult to appreciate abdominal sounds due to body habitus. Skin:  panniculectomy well-healing with small area of serous drainage the lateral one third. Musculoskeletal:  grossly normal tone BUE/BLE Psychiatric:  grossly normal mood and affect, speech fluent and appropriate Neurologic:  CN 2-12 grossly intact, moves all extremities in coordinated fashion.          Labs on Admission:  Basic Metabolic Panel:  Recent Labs Lab 01/17/15 1535  NA 138  K 3.9  CL 102  CO2 25  GLUCOSE 105*  BUN 16  CREATININE 0.83  CALCIUM 10.3   Liver Function Tests:  Recent Labs Lab 01/17/15 1535  AST 46*  ALT 49  ALKPHOS 142*  BILITOT 0.7  PROT 7.1  ALBUMIN 3.0*    Recent Labs Lab 01/17/15 1535  LIPASE 22   No results for input(s): AMMONIA in the last 168 hours. CBC:  Recent Labs Lab 01/17/15 1535  WBC 5.0  NEUTROABS 4.3  HGB 13.2  HCT 39.6  MCV 90.4  PLT 197   Cardiac Enzymes: No results for  input(s): CKTOTAL, CKMB, CKMBINDEX, TROPONINI in the last 168 hours.  BNP (last 3 results) No results for input(s): BNP in the last 8760 hours.  ProBNP (last 3 results) No results for input(s): PROBNP in the last 8760 hours.  CBG: No results for input(s): GLUCAP in the last 168 hours.  Radiological Exams on Admission: Dg Chest Portable 1 View  01/17/2015  CLINICAL DATA:  Coffee-ground emesis. Mild shortness of breath. Pain. EXAM: PORTABLE CHEST 1 VIEW COMPARISON:  10/07/2013 FINDINGS: Chronic cardiopericardial enlargement. Unchanged aortic and hilar contours when accounting for rotation. Right infrahilar density is likely vessels, accentuated by rotation. There is no edema, consolidation, effusion, or pneumothorax. Chronic diaphragm  flattening. IMPRESSION: No acute finding. Electronically Signed   By: Marnee Spring M.D.   On: 01/17/2015 15:48   Ct Renal Stone Study  01/17/2015  CLINICAL DATA:  Abdominal pain, hurts all over EXAM: CT ABDOMEN AND PELVIS WITHOUT CONTRAST TECHNIQUE: Multidetector CT imaging of the abdomen and pelvis was performed following the standard protocol without IV contrast. COMPARISON:  10/12/2014 FINDINGS: There is diffuse osteopenia. Degenerative changes are noted thoracic and lumbar spine. There are streaky artifacts from patient's large body habitus. The lung bases are unremarkable. There is intrahepatic pneumobilia probable post cholecystectomy. The patient is status postcholecystectomy. Some air noted within CBD. Unenhanced pancreas and spleen is unremarkable. Bilateral adrenal adenomas again noted. Atherosclerotic calcifications of abdominal aorta and iliac arteries are noted. No aortic aneurysm. There is nonobstructive calculus in midpole of the right kidney measures 5 mm. Nonobstructive calcification in midpole of the left kidney measures 9 mm. There is mild right hydronephrosis and right hydroureter. Significant right perinephric stranding. Nonobstructive calculus  in right renal pelvis measures 7 mm. Axial image 61 there is calcified obstructive calculus in mid right ureter measures 6 mm at the level of L5-S1 disc space. There is a second obstructive calculus in right ureter at the same level measures 6 mm. Best visualized in coronal image 51. There is no small bowel obstruction. No ascites or free air. No adenopathy. Moderate stool noted in sigmoid colon. Moderate stool and gas noted within transverse colon. No pericecal inflammation. Normal appendix. The terminal ileum is unremarkable. The urinary bladder is empty limiting its assessment. Bilateral distal ureter is unremarkable. IMPRESSION: 1. There is bilateral nonobstructive nephrolithiasis. 2. Mild right hydronephrosis and right hydroureter. 3. Two adjacent obstructive calculi are noted in mid right ureter at L5-S1 level. The largest measures 6 mm. 4. Stable chronic pneumobilia probable post cholecystectomy. 5. Stable bilateral adrenal adenomas. 6. Limited assessment of urinary bladder which is empty. Electronically Signed   By: Natasha Mead M.D.   On: 01/17/2015 17:10      Assessment/Plan Principal Problem:   Pyelonephritis Active Problems:   UTI (lower urinary tract infection)   Gout   Morbid obesity (HCC)   Hydronephrosis   Lower extremity weakness   Essential hypertension   GERD without esophagitis  Pyelonephritis:  UA grossly abnormal. Temp 100.3 in ED. Likely due in large part to obstructive ureterolithiasis. Discussed case with on-call urologist, Dr. Marlou Porch, who agrees patient will likely need surgical intervention. - Med surge - NPO after midnight - Flomax - IVF - Ceftriaxone - F/u UCX - f/u Urology recs  LE weakness: bilat. Present since panniculectomy in April. Several mo of inpt rehab w/o improvement. Movement and sensation intact. Suspect pt likely deconditioned in hospital after surgery and has been unable to regain strength. Pt very sedentary and still >160kg.  - PT/OT  Afib:  chronic. Anticipate majority of elevated rate is due to illness and dehydration as opposed to RVR - continue metop - dilt 30. Consider further Dilt if HR elevates  HTN: - cont metop, metolazone  GERD: - cont PPI  Gout: chronic recurring episodes. Crrently no flare - cont allopurinol       Code Status: FULL  DVT Prophylaxis: Hep Family Communication: brother Disposition Plan:  Pending Improvement    MERRELL, DAVID J, MD Family Medicine [facility] [email or web address] Password [email or web address]

## 2015-01-18 ENCOUNTER — Inpatient Hospital Stay (HOSPITAL_COMMUNITY): Payer: Medicare Other | Admitting: Anesthesiology

## 2015-01-18 ENCOUNTER — Encounter (HOSPITAL_COMMUNITY): Payer: Self-pay | Admitting: *Deleted

## 2015-01-18 ENCOUNTER — Inpatient Hospital Stay (HOSPITAL_COMMUNITY): Payer: Medicare Other

## 2015-01-18 ENCOUNTER — Encounter (HOSPITAL_COMMUNITY)
Admission: EM | Disposition: A | Discharge status: Skilled Nursing Facility | Payer: Self-pay | Source: Home / Self Care | Attending: Internal Medicine

## 2015-01-18 DIAGNOSIS — N12 Tubulo-interstitial nephritis, not specified as acute or chronic: Principal | ICD-10-CM

## 2015-01-18 DIAGNOSIS — R5081 Fever presenting with conditions classified elsewhere: Secondary | ICD-10-CM

## 2015-01-18 DIAGNOSIS — N201 Calculus of ureter: Secondary | ICD-10-CM

## 2015-01-18 DIAGNOSIS — R1084 Generalized abdominal pain: Secondary | ICD-10-CM

## 2015-01-18 DIAGNOSIS — R112 Nausea with vomiting, unspecified: Secondary | ICD-10-CM

## 2015-01-18 DIAGNOSIS — I10 Essential (primary) hypertension: Secondary | ICD-10-CM

## 2015-01-18 HISTORY — PX: CYSTOSCOPY WITH RETROGRADE PYELOGRAM, URETEROSCOPY AND STENT PLACEMENT: SHX5789

## 2015-01-18 LAB — SURGICAL PCR SCREEN
MRSA, PCR: NEGATIVE
STAPHYLOCOCCUS AUREUS: POSITIVE — AB

## 2015-01-18 SURGERY — CYSTOURETEROSCOPY, WITH RETROGRADE PYELOGRAM AND STENT INSERTION
Anesthesia: General | Laterality: Bilateral

## 2015-01-18 MED ORDER — MIDAZOLAM HCL 2 MG/2ML IJ SOLN
INTRAMUSCULAR | Status: AC
  Filled 2015-01-18: qty 4

## 2015-01-18 MED ORDER — STERILE WATER FOR IRRIGATION IR SOLN
Status: DC | PRN
  Administered 2015-01-18: 3000 mL

## 2015-01-18 MED ORDER — MIDAZOLAM HCL 2 MG/2ML IJ SOLN
1.0000 mg | INTRAMUSCULAR | Status: DC | PRN
  Administered 2015-01-18: 2 mg via INTRAVENOUS

## 2015-01-18 MED ORDER — LIDOCAINE HCL 1 % IJ SOLN
INTRAMUSCULAR | Status: DC | PRN
  Administered 2015-01-18: 40 mg via INTRADERMAL

## 2015-01-18 MED ORDER — SUCCINYLCHOLINE CHLORIDE 20 MG/ML IJ SOLN
INTRAMUSCULAR | Status: DC | PRN
  Administered 2015-01-18: 180 mg via INTRAVENOUS

## 2015-01-18 MED ORDER — FENTANYL CITRATE (PF) 100 MCG/2ML IJ SOLN
INTRAMUSCULAR | Status: DC | PRN
  Administered 2015-01-18 (×4): 25 ug via INTRAVENOUS

## 2015-01-18 MED ORDER — MUPIROCIN 2 % EX OINT: 1.0000 "application " | TOPICAL_OINTMENT | Freq: Two times a day (BID) | CUTANEOUS | Status: DC

## 2015-01-18 MED ORDER — GLYCOPYRROLATE 0.2 MG/ML IJ SOLN
INTRAMUSCULAR | Status: AC
  Filled 2015-01-18: qty 1

## 2015-01-18 MED ORDER — IOHEXOL 350 MG/ML SOLN
INTRAVENOUS | Status: DC | PRN
  Administered 2015-01-18: 50 mL

## 2015-01-18 MED ORDER — MIDAZOLAM HCL 5 MG/5ML IJ SOLN
INTRAMUSCULAR | Status: DC | PRN
  Administered 2015-01-18: 2 mg via INTRAVENOUS

## 2015-01-18 MED ORDER — MIDAZOLAM HCL 2 MG/2ML IJ SOLN
INTRAMUSCULAR | Status: AC
  Filled 2015-01-18: qty 2

## 2015-01-18 MED ORDER — MUPIROCIN 2 % EX OINT
1.0000 "application " | TOPICAL_OINTMENT | Freq: Two times a day (BID) | CUTANEOUS | Status: DC
  Administered 2015-01-18 – 2015-01-20 (×5): 1 via NASAL
  Filled 2015-01-18 (×2): qty 22

## 2015-01-18 MED ORDER — CHLORHEXIDINE GLUCONATE CLOTH 2 % EX PADS
6.0000 | MEDICATED_PAD | Freq: Every day | CUTANEOUS | Status: DC
Start: 2015-01-18 — End: 2015-01-20
  Administered 2015-01-18 – 2015-01-20 (×3): 6 via TOPICAL

## 2015-01-18 MED ORDER — FENTANYL CITRATE (PF) 100 MCG/2ML IJ SOLN
INTRAMUSCULAR | Status: AC
  Filled 2015-01-18: qty 4

## 2015-01-18 MED ORDER — LACTATED RINGERS IV SOLN
INTRAVENOUS | Status: DC
  Administered 2015-01-18: 15:00:00 via INTRAVENOUS

## 2015-01-18 MED ORDER — PROPOFOL 10 MG/ML IV BOLUS
INTRAVENOUS | Status: DC | PRN
  Administered 2015-01-18: 170 mg via INTRAVENOUS

## 2015-01-18 MED ORDER — ONDANSETRON HCL 4 MG/2ML IJ SOLN: 4.0000 mg | Freq: Once | INTRAMUSCULAR | Status: DC | PRN

## 2015-01-18 MED ORDER — FENTANYL CITRATE (PF) 100 MCG/2ML IJ SOLN: 25.0000 ug | INTRAMUSCULAR | Status: DC | PRN

## 2015-01-18 SURGICAL SUPPLY — 29 items
BAG HAMPER (MISCELLANEOUS) ×2 IMPLANT
BAG URO CATCHER STRL LF (DRAPE) ×2 IMPLANT
BASKET LASER NITINOL 1.9FR (BASKET) IMPLANT
BASKET ZERO TIP NITINOL 2.4FR (BASKET) IMPLANT
BSKT STON RTRVL 120 1.9FR (BASKET)
BSKT STON RTRVL ZERO TP 2.4FR (BASKET)
CATH INTERMIT  6FR 70CM (CATHETERS) ×1 IMPLANT
CLOTH BEACON ORANGE TIMEOUT ST (SAFETY) ×2 IMPLANT
EXTRACTOR STONE NITINOL NGAGE (UROLOGICAL SUPPLIES) ×1 IMPLANT
FIBER LASER FLEXIVA 365 (UROLOGICAL SUPPLIES) IMPLANT
GLOVE BIO SURGEON STRL SZ8 (GLOVE) ×2 IMPLANT
GOWN STRL REUS W/ TWL LRG LVL3 (GOWN DISPOSABLE) ×1 IMPLANT
GOWN STRL REUS W/ TWL XL LVL3 (GOWN DISPOSABLE) ×1 IMPLANT
GOWN STRL REUS W/TWL LRG LVL3 (GOWN DISPOSABLE) ×2
GOWN STRL REUS W/TWL XL LVL3 (GOWN DISPOSABLE) ×2
GUIDEWIRE ANG ZIPWIRE 038X150 (WIRE) ×1 IMPLANT
GUIDEWIRE STR DUAL SENSOR (WIRE) IMPLANT
GUIDEWIRE STR ZIPWIRE 035X150 (MISCELLANEOUS) ×2 IMPLANT
IV NS IRRIG 3000ML ARTHROMATIC (IV SOLUTION) ×1 IMPLANT
LASER FIBER DISP (UROLOGICAL SUPPLIES) IMPLANT
MANIFOLD NEPTUNE II (INSTRUMENTS) IMPLANT
PACK CYSTO (CUSTOM PROCEDURE TRAY) ×2 IMPLANT
PAD ARMBOARD 7.5X6 YLW CONV (MISCELLANEOUS) ×2 IMPLANT
STENT URET 6FRX26 CONTOUR (STENTS) ×2 IMPLANT
SYRINGE 10CC LL (SYRINGE) ×2 IMPLANT
TOWEL OR 17X26 4PK STRL BLUE (TOWEL DISPOSABLE) ×1 IMPLANT
TRAY FOLEY CATH SILVER 16FR (SET/KITS/TRAYS/PACK) ×1 IMPLANT
TUBE FEEDING 8FR 16IN STR KANG (MISCELLANEOUS) ×2 IMPLANT
WATER STERILE IRR 3000ML UROMA (IV SOLUTION) ×2 IMPLANT

## 2015-01-18 NOTE — Brief Op Note (Signed)
01/17/2015 - 01/18/2015  4:10 PM  PATIENT:  Frederick Brown  64 y.o. male  PRE-OPERATIVE DIAGNOSIS:  sepsis, bilateral calculi  POST-OPERATIVE DIAGNOSIS:  sepsis, bilateral calculi  PROCEDURE:  Procedure(s): CYSTOSCOPY WITH BILATERAL RETROGRADE PYELOGRAM, AND BILATERAL STENT PLACEMENT (Bilateral)  SURGEON:  Surgeon(s) and Role:    * Malen GauzePatrick L McKenzie, MD - Primary  PHYSICIAN ASSISTANT:   ASSISTANTS: none   ANESTHESIA:   general  EBL:  Total I/O In: 700 [I.V.:700] Out: 775 [Urine:775]  BLOOD ADMINISTERED:none  DRAINS: Urinary Catheter (Foley), bilateral 6x26 JJ ureteral stents  LOCAL MEDICATIONS USED:  NONE  SPECIMEN:  No Specimen  DISPOSITION OF SPECIMEN:  N/A  COUNTS:  YES  TOURNIQUET:  * No tourniquets in log *  DICTATION: .Note written in EPIC  PLAN OF CARE: Admit to inpatient   PATIENT DISPOSITION:  PACU - hemodynamically stable.   Delay start of Pharmacological VTE agent (>24hrs) due to surgical blood loss or risk of bleeding: not applicable

## 2015-01-18 NOTE — Op Note (Signed)
.  Preoperative diagnosis: bilateral ureteral stone  Postoperative diagnosis: Same  Procedure: 1 cystoscopy 2. bilateralretrograde pyelography 3.  Intraoperative fluoroscopy, under one hour, with interpretation 4.  bilateral 6 x 26 JJ stent placement  Attending: Wilkie AyePatrick Quran Vasco  Anesthesia: General  Estimated blood loss: None  Drains: bilateral 6 x 26 JJ ureteral stents without tethers  Specimens: none  Antibiotics: rocephin  Findings: numerous proximal right ureteral calculi. Left UPJ calculus. Right severe hydronephrosis, left mild hydronephrosis. No masses/lesions in the bladder. Ureteral orifices in normal anatomic location.  Indications: Patient is a [age] year old male with a history of bilateral ureteral calculi and concern for sepsis. After discussing treatment options, they decided proceed with bilateral stent placement  Procedure her in detail: The patient was brought to the operating room and a brief timeout was done to ensure correct patient, correct procedure, correct site.  General anesthesia was administered patient was placed in dorsal lithotomy position.  Her genitalia was then prepped and draped in usual sterile fashion.  A rigid 22 French cystoscope was passed in the urethra and the bladder.  Bladder was inspected free masses or lesions.  the ureteral orifices were in the normal orthotopic locations.Marland Kitchen. a 6 french ureteral catheter was then instilled into the left ureteral orifice.  a gentle retrograde was obtained and findings noted above.  We then placed a 6 x 26 double-j ureteral stent over the zip wire. We then removed the wire and good coil was noted in the the renal pelvis under fluoroscopy and the bladder under direct vision.  We then turned out attention to the right side. a 6 french ureteral catheter was then instilled into the right ureteral orifice.  a gentle retrograde was obtained and findings noted above.  we then placed a 6 x 26 double-j ureteral stent over the  zip wire.  We then removed the wire and good coil was noted in the the renal pelvis under fluoroscopy and the bladder under direct vision. A 16 french foley catheter was placed,  the bladder was then drained and this concluded the procedure which was well tolerated by patient.  Complications: None  Condition: Stable, extubated, transferred to PACU  Plan: The patient is to be admitted for IV antibiotics. They will followup with Urology in 2 weeks for bilateral stone extraction

## 2015-01-18 NOTE — Progress Notes (Signed)
[facility] PROGRESS NOTE  Frederick Brown BJY:[id number] DOB: 04/29/1950 DOA: 01/17/2015 PCP: Trinna Post, MD  Assessment/Plan: Pyelonephritis -Continue rocephin pending cx data. -Has obstructing stones and has been evaluated by GU for stent placement today.  Generalized Weakness -Suspect 2/2 acute illness. -Check TSH/B12. -PT eval pending.  Chronic A Fib -Continue metoprolol. -Not on chronic anticoagulation.  HTN -Fair control. -Do not anticipate medication changes while in the hospital.  Gout -Continue allopurinol.  GERD -Continue PPI  Code Status: Full Code Family Communication: Patient only  Disposition Plan: To be determined.   Consultants:  Urology   Antibiotics:  Rocephin   Subjective: Feels better than yesterday. Febrile this am. Still nauseous and with mild abdominal pain.  Objective: Filed Vitals:   01/17/15 1958 01/18/15 0500 01/18/15 1000 01/18/15 1016  BP:  136/85 109/53   Pulse:  103  114  Temp:  98.4 F (36.9 C)    TempSrc:  Oral    Resp:  20    Height:  (1.778 m)     Weight: 151.048 kg (333 lb)     SpO2:  100%      Intake/Output Summary (Last 24 hours) at 01/18/15 1253 Last data filed at 01/18/15 0854  Gross per 24 hour  Intake 972.92 ml  Output    875 ml  Net  97.92 ml   Filed Weights   01/17/15 1504 01/17/15 1958  Weight: 157.398 kg (347 lb) 151.048 kg (333 lb)    Exam:   General:  AA Ox3  Cardiovascular: tachy, regular  Respiratory: CTA B  Abdomen: S/NT/ND/+BS  Extremities: no C/C/E   Neurologic:  Non-focal  Data Reviewed: Basic Metabolic Panel:  Recent Labs Lab 01/17/15 1535  NA 138  K 3.9  CL 102  CO2 25  GLUCOSE 105*  BUN 16  CREATININE 0.83  CALCIUM 10.3   Liver Function Tests:  Recent Labs Lab 01/17/15 1535  AST 46*  ALT 49  ALKPHOS 142*  BILITOT 0.7  PROT 7.1  ALBUMIN 3.0*    Recent Labs Lab 01/17/15 1535  LIPASE 22   No results for input(s):  AMMONIA in the last 168 hours. CBC:  Recent Labs Lab 01/17/15 1535  WBC 5.0  NEUTROABS 4.3  HGB 13.2  HCT 39.6  MCV 90.4  PLT 197   Cardiac Enzymes: No results for input(s): CKTOTAL, CKMB, CKMBINDEX, TROPONINI in the last 168 hours. BNP (last 3 results) No results for input(s): BNP in the last 8760 hours.  ProBNP (last 3 results) No results for input(s): PROBNP in the last 8760 hours.  CBG: No results for input(s): GLUCAP in the last 168 hours.  Recent Results (from the past 240 hour(s))  Surgical pcr screen     Status: Abnormal   Collection Time: 01/17/15 10:18 PM  Result Value Ref Range Status   MRSA, PCR NEGATIVE NEGATIVE Final   Staphylococcus aureus POSITIVE (A) NEGATIVE Final    Comment:        The Xpert SA Assay (FDA approved for NASAL specimens in patients over 91 years of age), is one component of a comprehensive surveillance program.  Test performance has been validated by [facility] for patients greater than or equal to 100 year old. It is not intended to diagnose infection nor to guide or monitor treatment.      Studies: Dg Chest Portable 1 View  01/17/2015  CLINICAL DATA:  Coffee-ground emesis. Mild shortness of breath. Pain. EXAM: PORTABLE CHEST 1 VIEW COMPARISON:  10/07/2013 FINDINGS: Chronic cardiopericardial enlargement. Unchanged aortic and hilar contours when accounting for rotation. Right infrahilar density is likely vessels, accentuated by rotation. There is no edema, consolidation, effusion, or pneumothorax. Chronic diaphragm flattening. IMPRESSION: No acute finding. Electronically Signed   By: Marnee Spring M.D.   On: 01/17/2015 15:48   Ct Renal Stone Study  01/17/2015  CLINICAL DATA:  Abdominal pain, hurts all over EXAM: CT ABDOMEN AND PELVIS WITHOUT CONTRAST TECHNIQUE: Multidetector CT imaging of the abdomen and pelvis was performed following the standard protocol without IV contrast. COMPARISON:  10/12/2014 FINDINGS: There is diffuse  osteopenia. Degenerative changes are noted thoracic and lumbar spine. There are streaky artifacts from patient's large body habitus. The lung bases are unremarkable. There is intrahepatic pneumobilia probable post cholecystectomy. The patient is status postcholecystectomy. Some air noted within CBD. Unenhanced pancreas and spleen is unremarkable. Bilateral adrenal adenomas again noted. Atherosclerotic calcifications of abdominal aorta and iliac arteries are noted. No aortic aneurysm. There is nonobstructive calculus in midpole of the right kidney measures 5 mm. Nonobstructive calcification in midpole of the left kidney measures 9 mm. There is mild right hydronephrosis and right hydroureter. Significant right perinephric stranding. Nonobstructive calculus in right renal pelvis measures 7 mm. Axial image 61 there is calcified obstructive calculus in mid right ureter measures 6 mm at the level of L5-S1 disc space. There is a second obstructive calculus in right ureter at the same level measures 6 mm. Best visualized in coronal image 51. There is no small bowel obstruction. No ascites or free air. No adenopathy. Moderate stool noted in sigmoid colon. Moderate stool and gas noted within transverse colon. No pericecal inflammation. Normal appendix. The terminal ileum is unremarkable. The urinary bladder is empty limiting its assessment. Bilateral distal ureter is unremarkable. IMPRESSION: 1. There is bilateral nonobstructive nephrolithiasis. 2. Mild right hydronephrosis and right hydroureter. 3. Two adjacent obstructive calculi are noted in mid right ureter at L5-S1 level. The largest measures 6 mm. 4. Stable chronic pneumobilia probable post cholecystectomy. 5. Stable bilateral adrenal adenomas. 6. Limited assessment of urinary bladder which is empty. Electronically Signed   By: Natasha Mead M.D.   On: 01/17/2015 17:10    Scheduled Meds: . allopurinol  300 mg Oral Daily  . aspirin EC  81 mg Oral Daily  . cefTRIAXone  (ROCEPHIN) IVPB 1 gram/50 mL D5W  1 g Intravenous Once  . Chlorhexidine Gluconate Cloth  6 each Topical Daily  . furosemide  40 mg Oral Daily  . heparin  5,000 Units Subcutaneous 3 times per day  . metolazone  2.5 mg Oral BID  . metoprolol tartrate  25 mg Oral BID  . mupirocin ointment  1 application Nasal BID  . pantoprazole  40 mg Oral Daily  . potassium chloride  10 mEq Oral Daily  . simvastatin  10 mg Oral q1800  . tamsulosin  0.4 mg Oral Daily  . traZODone  50 mg Oral QHS   Continuous Infusions: . sodium chloride 125 mL/hr at 01/17/15 2213    Principal Problem:   Pyelonephritis Active Problems:   UTI (lower urinary tract infection)   Gout   Morbid obesity (HCC)   Hydronephrosis   Lower extremity weakness   Essential hypertension   GERD without esophagitis    Time spent: 25 minutes. Greater than 50% of this time was spent in direct contact with the patient coordinating care.    Chaya Jan  [facility] Pager [phone]  If 7PM-7AM, please contact night-coverage at [email or web address],  password [email or web address] 01/18/2015, 12:53 PM  LOS: 1 day

## 2015-01-18 NOTE — Addendum Note (Signed)
Addendum  created 01/18/15 1624 by Benita StabileLisa Testa Jermey Closs, MD   Modules edited: Anesthesia Attestations

## 2015-01-18 NOTE — Consult Note (Signed)
Urology Consult  Referring physician: Dr. Jerilee Hoh Reason for referral: ureteral stone  Chief Complaint: right abdominal pain  History of Present Illness: Frederick Brown is a 64yo admitted last with multiple right ureteral calculi, fever, and concern for sepsis. He has had multiple stone events in the past but does not remember how they were treated. His pain he thinks started 3-4 days ago but the patient is confused and is a poor historian. Temp at home was 102. He has constant severe, sharp, right abdominal pain that is non-radiating. He has associated nausea and vomiting. Urine microscopy shows numerous WBCs and bacteria. CT scan shows multiple right ureteral calculi with moderate perinephric stranding. He also has a left renal pelvis/mid pole calculus.  Past Medical History  Diagnosis Date  . Hypertension   . Obesity, morbid (more than 100 lbs over ideal weight or BMI > 40) (HCC)   . Degenerative joint disease   . Alcohol abuse   . Umbilical hernia   . Gastroesophageal reflux disease   . Vitamin B12 deficiency 05/22/2011  . Fracture of metatarsal of left foot, closed 05/28/2011  . Fracture of great toe, left, closed 05/28/2011  . Chronic respiratory failure with hypoxia ([place]) 05/28/2011  . Urticarial rash 05/31/2011    From Cipro  . Cor pulmonale, chronic [facility]) February 2013  . Obesity hypoventilation syndrome [facility]) February 2013  . Pickwickian syndrome ([place])   . CHF (congestive heart failure) ([place])   . A-fib [facility])    Past Surgical History  Procedure Laterality Date  . Cholecystectomy    . Knee surgery      Medications: I have reviewed the patient's current medications. Allergies:  Allergies  Allergen Reactions  . Ciprofloxacin Rash    Patient is not aware of the following allergy    Family History  Problem Relation Age of Onset  . Heart attack Father   . Heart failure Father   . Stroke Father   . Hypertension Father    Social History:  reports that he has never smoked. He  does not have any smokeless tobacco history on file. He reports that he drinks about 0.6 oz of alcohol per week. He reports that he does not use illicit drugs.  Review of Systems  Constitutional: Positive for fever, chills and malaise/fatigue.  Gastrointestinal: Positive for nausea, vomiting and abdominal pain.  Genitourinary: Positive for flank pain.  All other systems reviewed and are negative.   Physical Exam:  Vital signs in last 24 hours: Temp:  [98.4 F (36.9 C)-100.3 F (37.9 C)] 98.4 F (36.9 C) (10/19 0500) Pulse Rate:  [103-118] 114 (10/19 1016) Resp:  [12-20] 20 (10/19 0500) BP: (97-136)/(46-85) 109/53 mmHg (10/19 1000) SpO2:  [95 %-100 %] 100 % (10/19 0500) Weight:  [151.048 kg (333 lb)-157.398 kg (347 lb)] 151.048 kg (333 lb) (10/18 1958) Physical Exam  Constitutional: He appears well-developed and well-nourished.  HENT:  Head: Normocephalic and atraumatic.  Eyes: EOM are normal. Pupils are equal, round, and reactive to light.  Neck: Normal range of motion. No thyromegaly present.  Cardiovascular: Regular rhythm.   Respiratory: Effort normal. No respiratory distress.  GI: Soft. He exhibits no distension.  Musculoskeletal: Normal range of motion.  Neurological: He is alert.  A&Ox1  Skin: Skin is warm and dry.  Psychiatric: He has a normal mood and affect.    Laboratory Data:  Results for orders placed or performed during the hospital encounter of 01/17/15 (from the past 72 hour(s))  CBC with Differential/Platelet  Status: Abnormal   Collection Time: 01/17/15  3:35 PM  Result Value Ref Range   WBC 5.0 4.0 - 10.5 K/uL   RBC 4.38 4.22 - 5.81 MIL/uL   Hemoglobin 13.2 13.0 - 17.0 g/dL   HCT 39.6 39.0 - 52.0 %   MCV 90.4 78.0 - 100.0 fL   MCH 30.1 26.0 - 34.0 pg   MCHC 33.3 30.0 - 36.0 g/dL   RDW 14.9 11.5 - 15.5 %   Platelets 197 150 - 400 K/uL   Neutrophils Relative % 85 %   Neutro Abs 4.3 1.7 - 7.7 K/uL   Lymphocytes Relative 9 %   Lymphs Abs 0.4 (Brown)  0.7 - 4.0 K/uL   Monocytes Relative 6 %   Monocytes Absolute 0.3 0.1 - 1.0 K/uL   Eosinophils Relative 0 %   Eosinophils Absolute 0.0 0.0 - 0.7 K/uL   Basophils Relative 0 %   Basophils Absolute 0.0 0.0 - 0.1 K/uL  Comprehensive metabolic panel     Status: Abnormal   Collection Time: 01/17/15  3:35 PM  Result Value Ref Range   Sodium 138 135 - 145 mmol/Brown   Potassium 3.9 3.5 - 5.1 mmol/Brown   Chloride 102 101 - 111 mmol/Brown   CO2 25 22 - 32 mmol/Brown   Glucose, Bld 105 (H) 65 - 99 mg/dL   BUN 16 6 - 20 mg/dL   Creatinine, Ser 0.83 0.61 - 1.24 mg/dL   Calcium 10.3 8.9 - 10.3 mg/dL   Total Protein 7.1 6.5 - 8.1 g/dL   Albumin 3.0 (Brown) 3.5 - 5.0 g/dL   AST 46 (H) 15 - 41 U/Brown   ALT 49 17 - 63 U/Brown   Alkaline Phosphatase 142 (H) 38 - 126 U/Brown   Total Bilirubin 0.7 0.3 - 1.2 mg/dL   GFR calc non Af Amer >60 >60 mL/min   GFR calc Af Amer >60 >60 mL/min    Comment: (NOTE) The eGFR has been calculated using the CKD EPI equation. This calculation has not been validated in all clinical situations. eGFR's persistently <60 mL/min signify possible Chronic Kidney Disease.    Anion gap 11 5 - 15  Lipase, blood     Status: None   Collection Time: 01/17/15  3:35 PM  Result Value Ref Range   Lipase 22 22 - 51 U/Brown  Urinalysis, Routine w reflex microscopic (not at [facility])     Status: Abnormal   Collection Time: 01/17/15  4:37 PM  Result Value Ref Range   Color, Urine YELLOW YELLOW   APPearance HAZY (A) CLEAR   Specific Gravity, Urine 1.015 1.005 - 1.030   pH 7.0 5.0 - 8.0   Glucose, UA NEGATIVE NEGATIVE mg/dL   Hgb urine dipstick SMALL (A) NEGATIVE   Bilirubin Urine SMALL (A) NEGATIVE   Ketones, ur NEGATIVE NEGATIVE mg/dL   Protein, ur 30 (A) NEGATIVE mg/dL   Urobilinogen, UA 1.0 0.0 - 1.0 mg/dL   Nitrite NEGATIVE NEGATIVE   Leukocytes, UA LARGE (A) NEGATIVE  Urine microscopic-add on     Status: Abnormal   Collection Time: 01/17/15  4:37 PM  Result Value Ref Range   Squamous Epithelial / LPF FEW  (A) RARE   WBC, UA TOO NUMEROUS TO COUNT <3 WBC/hpf   RBC / HPF 3-6 <3 RBC/hpf   Bacteria, UA MANY (A) RARE  Surgical pcr screen     Status: Abnormal   Collection Time: 01/17/15 10:18 PM  Result Value Ref Range   MRSA, PCR NEGATIVE  NEGATIVE   Staphylococcus aureus POSITIVE (A) NEGATIVE    Comment:        The Xpert SA Assay (FDA approved for NASAL specimens in patients over 5 years of age), is one component of a comprehensive surveillance program.  Test performance has been validated by [facility] for patients greater than or equal to 81 year old. It is not intended to diagnose infection nor to guide or monitor treatment.    Recent Results (from the past 240 hour(s))  Surgical pcr screen     Status: Abnormal   Collection Time: 01/17/15 10:18 PM  Result Value Ref Range Status   MRSA, PCR NEGATIVE NEGATIVE Final   Staphylococcus aureus POSITIVE (A) NEGATIVE Final    Comment:        The Xpert SA Assay (FDA approved for NASAL specimens in patients over 73 years of age), is one component of a comprehensive surveillance program.  Test performance has been validated by [facility] for patients greater than or equal to 51 year old. It is not intended to diagnose infection nor to guide or monitor treatment.    Creatinine:  Recent Labs  01/17/15 1535  CREATININE 0.83   Baseline Creatinine: 0.8  Impression/Assessment:  64yo with right ureteral calculi, left renal calculi, fever, and concern for sepsis  Plan:  The risks/benefits/alternatives to cystoscopy, bilateral retrogrades, and bilateral stent placement was explained to the patient and he understands and wishes to proceed with surgery  Frederick Brown 01/18/2015, 12:29 PM

## 2015-01-18 NOTE — Anesthesia Preprocedure Evaluation (Signed)
Anesthesia Evaluation  Patient identified by MRN, date of birth, ID band Patient awake    Reviewed: Allergy & Precautions, NPO status , Patient's Chart, lab work & pertinent test results, reviewed documented beta blocker date and time   Airway Mallampati: III  TM Distance: >3 FB     Dental  (+) Missing,    Pulmonary sleep apnea ,     + decreased breath sounds      Cardiovascular Exercise Tolerance: Poor hypertension, Pt. on medications + CAD and +CHF  + dysrhythmias Atrial Fibrillation IV Rhythm:Irregular     Neuro/Psych    GI/Hepatic GERD  Medicated,  Endo/Other  Morbid obesity  Renal/GU Renal InsufficiencyRenal disease     Musculoskeletal  (+) Arthritis , Osteoarthritis,    Abdominal (+) + obese,   Peds  Hematology   Anesthesia Other Findings   Reproductive/Obstetrics                             Anesthesia Physical Anesthesia Plan  ASA: IV  Anesthesia Plan: General   Post-op Pain Management:    Induction: Intravenous  Airway Management Planned: Oral ETT  Additional Equipment:   Intra-op Plan:   Post-operative Plan: Extubation in OR  Informed Consent: I have reviewed the patients History and Physical, chart, labs and discussed the procedure including the risks, benefits and alternatives for the proposed anesthesia with the patient or authorized representative who has indicated his/her understanding and acceptance.   Dental advisory given  Plan Discussed with: CRNA  Anesthesia Plan Comments:         Anesthesia Quick Evaluation

## 2015-01-18 NOTE — Anesthesia Postprocedure Evaluation (Signed)
  Anesthesia Post-op Note  Patient: Frederick CanterburyJames H Connett  Procedure(s) Performed: Procedure(s): CYSTOSCOPY WITH BILATERAL RETROGRADE PYELOGRAM, AND BILATERAL STENT PLACEMENT (Bilateral)  Patient Location: PACU  Anesthesia Type:General  Level of Consciousness: awake, alert , oriented and patient cooperative  Airway and Oxygen Therapy: Patient Spontanous Breathing and Patient connected to face mask oxygen  Post-op Pain: mild  Post-op Assessment: Post-op Vital signs reviewed, Patient's Cardiovascular Status Stable, Respiratory Function Stable, Patent Airway, No signs of Nausea or vomiting and Pain level controlled              Post-op Vital Signs: Reviewed and stable  Last Vitals:  Filed Vitals:   01/18/15 1612  BP: 88/72  Pulse: 102  Temp: 36.7 C  Resp: 17    Complications: No apparent anesthesia complications

## 2015-01-18 NOTE — Transfer of Care (Signed)
Immediate Anesthesia Transfer of Care Note  Patient: Frederick CanterburyJames H Binns  Procedure(s) Performed: Procedure(s): CYSTOSCOPY WITH BILATERAL RETROGRADE PYELOGRAM, AND BILATERAL STENT PLACEMENT (Bilateral)  Patient Location: PACU  Anesthesia Type:General  Level of Consciousness: awake, alert  and patient cooperative  Airway & Oxygen Therapy: Patient Spontanous Breathing and Patient connected to face mask oxygen  Post-op Assessment: Report given to RN, Post -op Vital signs reviewed and stable and Patient moving all extremities  Post vital signs: Reviewed and stable  Last Vitals:  Filed Vitals:   01/18/15 1500  BP: 91/59  Pulse:   Temp:   Resp: 20    Complications: No apparent anesthesia complications

## 2015-01-18 NOTE — Progress Notes (Signed)
PT Cancellation Note  Patient Details Name: Frederick Brown MRN: [id number] DOB: Aug 21, 1950   Cancelled Treatment:    Reason Eval/Treat Not Completed: Other (comment).  Pt is awaiting stent placement by Urologist.  He reports feeling "terrible".  I felt it best to defer PT eval until after procedure in the hopes that pt would be feeling better.   [name], [name] 01/18/2015, 12:28 PM

## 2015-01-18 NOTE — Progress Notes (Signed)
Patient's surgical MRSA screen was negative for MRSA but positive for staph. Standing orders initiated. Patient educated.

## 2015-01-18 NOTE — Anesthesia Procedure Notes (Signed)
Procedure Name: Intubation Date/Time: 01/18/2015 3:32 PM Performed by: Despina HiddenIDACAVAGE, Righteous Claiborne J Pre-anesthesia Checklist: Emergency Drugs available, Patient identified, Suction available and Patient being monitored Patient Re-evaluated:Patient Re-evaluated prior to inductionOxygen Delivery Method: Circle system utilized Preoxygenation: Pre-oxygenation with 100% oxygen Intubation Type: IV induction, Rapid sequence and Cricoid Pressure applied Ventilation: Oral airway inserted - appropriate to patient size and Mask ventilation without difficulty Laryngoscope Size: Mac and 3 Grade View: Grade I Tube type: Oral Tube size: 8.0 mm Number of attempts: 1 Placement Confirmation: ETT inserted through vocal cords under direct vision,  positive ETCO2 and breath sounds checked- equal and bilateral Secured at: 24 cm Tube secured with: Tape Dental Injury: Teeth and Oropharynx as per pre-operative assessment

## 2015-01-19 ENCOUNTER — Encounter (HOSPITAL_COMMUNITY): Payer: Self-pay | Admitting: Urology

## 2015-01-19 DIAGNOSIS — K219 Gastro-esophageal reflux disease without esophagitis: Secondary | ICD-10-CM

## 2015-01-19 LAB — CBC
HCT: 32.1 % — ABNORMAL LOW (ref 39.0–52.0)
HEMOGLOBIN: 10.6 g/dL — AB (ref 13.0–17.0)
MCH: 29.4 pg (ref 26.0–34.0)
MCHC: 33 g/dL (ref 30.0–36.0)
MCV: 89.2 fL (ref 78.0–100.0)
Platelets: 182 10*3/uL (ref 150–400)
RBC: 3.6 MIL/uL — ABNORMAL LOW (ref 4.22–5.81)
RDW: 14.9 % (ref 11.5–15.5)
WBC: 3.9 10*3/uL — ABNORMAL LOW (ref 4.0–10.5)

## 2015-01-19 LAB — BASIC METABOLIC PANEL
Anion gap: 7 (ref 5–15)
BUN: 23 mg/dL — AB (ref 6–20)
CALCIUM: 9.8 mg/dL (ref 8.9–10.3)
CO2: 27 mmol/L (ref 22–32)
Chloride: 103 mmol/L (ref 101–111)
Creatinine, Ser: 0.83 mg/dL (ref 0.61–1.24)
GFR calc Af Amer: >60 mL/min (ref >60)
GLUCOSE: 117 mg/dL — AB (ref 65–99)
Potassium: 3.2 mmol/L — ABNORMAL LOW (ref 3.5–5.1)
Sodium: 137 mmol/L (ref 135–145)

## 2015-01-19 LAB — TSH: TSH: 0.2 u[IU]/mL — AB (ref 0.350–4.500)

## 2015-01-19 LAB — T4, FREE: FREE T4: 1.58 ng/dL — AB (ref 0.61–1.12)

## 2015-01-19 LAB — VITAMIN B12: Vitamin B-12: 215 pg/mL (ref 180–914)

## 2015-01-19 NOTE — Addendum Note (Signed)
Addendum  created 01/19/15 1102 by Despina Hiddenobert J Jshaun Abernathy, CRNA   Modules edited: Notes Section   Notes Section:  File: [id number]

## 2015-01-19 NOTE — Progress Notes (Signed)
[facility] PROGRESS NOTE  Clyde CanterburyJames H Barbar ZOX:[id number] DOB: 1950-04-20 DOA: 01/17/2015 PCP: Trinna PostKOBERLEIN, JUNELL CAROL, MD  Assessment/Plan: Pyelonephritis -Continue rocephin pending cx data. -Had bilateral stent placement yesterday. -Will need follow up with urology for definitive stone management. -Still nauseous today.  Generalized Weakness -Suspect 2/2 acute illness. -TSH low, check free T4 and free T3. -PT eval/B12 pending.  Chronic A Fib -Continue metoprolol. -Not on chronic anticoagulation.  HTN -Fair control. -Do not anticipate medication changes while in the hospital.  Gout -Continue allopurinol.  GERD -Continue PPI  Left Shoulder/Elbow Pain -Has good ROM (doubt an xray would be useful). -Will request OT to see.  Code Status: Full Code Family Communication: Patient only  Disposition Plan: Likely home in 24-48 hours.   Consultants:  Urology   Antibiotics:  Rocephin   Subjective: Still very nauseous today.  Objective: Filed Vitals:   01/18/15 1703 01/18/15 2300 01/18/15 2318 01/19/15 0646  BP: 100/62 97/40 99/63  96/67  Pulse:  97 100 74  Temp: 98.1 F (36.7 C)  97.9 F (36.6 C) 97.7 F (36.5 C)  TempSrc: Oral  Oral Oral  Resp: 20  17 20   Height:      Weight:      SpO2: 93%  100% 95%    Intake/Output Summary (Last 24 hours) at 01/19/15 1147 Last data filed at 01/19/15 0854  Gross per 24 hour  Intake   1180 ml  Output   2750 ml  Net  -1570 ml   Filed Weights   01/17/15 1504 01/17/15 1958 01/18/15 1348  Weight: 157.398 kg (347 lb) 151.048 kg (333 lb) 151.048 kg (333 lb)    Exam:   General:  AA Ox3  Cardiovascular: tachy, regular  Respiratory: CTA B  Abdomen: S/NT/ND/+BS  Extremities: no C/C/E   Neurologic:  Non-focal  Data Reviewed: Basic Metabolic Panel:  Recent Labs Lab 01/17/15 1535 01/19/15 0547  NA 138 137  K 3.9 3.2*  CL 102 103  CO2 25 27  GLUCOSE 105* 117*  BUN 16 23*  CREATININE 0.83  0.83  CALCIUM 10.3 9.8   Liver Function Tests:  Recent Labs Lab 01/17/15 1535  AST 46*  ALT 49  ALKPHOS 142*  BILITOT 0.7  PROT 7.1  ALBUMIN 3.0*    Recent Labs Lab 01/17/15 1535  LIPASE 22   No results for input(s): AMMONIA in the last 168 hours. CBC:  Recent Labs Lab 01/17/15 1535 01/19/15 0547  WBC 5.0 3.9*  NEUTROABS 4.3  --   HGB 13.2 10.6*  HCT 39.6 32.1*  MCV 90.4 89.2  PLT 197 182   Cardiac Enzymes: No results for input(s): CKTOTAL, CKMB, CKMBINDEX, TROPONINI in the last 168 hours. BNP (last 3 results) No results for input(s): BNP in the last 8760 hours.  ProBNP (last 3 results) No results for input(s): PROBNP in the last 8760 hours.  CBG: No results for input(s): GLUCAP in the last 168 hours.  Recent Results (from the past 240 hour(s))  Urine culture     Status: None (Preliminary result)   Collection Time: 01/17/15  4:37 PM  Result Value Ref Range Status   Specimen Description URINE, CLEAN CATCH  Final   Special Requests NONE  Final   Culture   Final    >=100,000 COLONIES/mL PROTEUS MIRABILIS Performed at [facility]    Report Status PENDING  Incomplete  Surgical pcr screen     Status: Abnormal   Collection Time: 01/17/15 10:18 PM  Result Value  Ref Range Status   MRSA, PCR NEGATIVE NEGATIVE Final   Staphylococcus aureus POSITIVE (A) NEGATIVE Final    Comment:        The Xpert SA Assay (FDA approved for NASAL specimens in patients over 46 years of age), is one component of a comprehensive surveillance program.  Test performance has been validated by [facility] for patients greater than or equal to 35 year old. It is not intended to diagnose infection nor to guide or monitor treatment.      Studies: Dg Retrograde Pyelogram  01/18/2015  CLINICAL DATA:  Bilateral ureteral calculi and stent placement EXAM: RETROGRADE PYELOGRAM COMPARISON:  CT abdomen pelvis - 01/17/2015; 10/12/2014 FINDINGS: Nine spot intraoperative  fluoroscopic images during bilateral retrograde pyelogram and stent placement are provided for review. Initial image demonstrates selective cannulation opacification the distal aspect of the left ureter. There is marked tortuosity involving the distal aspect of the left ureter. There are several ill-defined filling defects within the caudal aspect the left renal pelvis, likely compatible with the nonobstructing stones seen on preceding abdominal CT. No definite evidence of left-sided pelvicaliectasis. Subsequent image demonstrate placement of a left-sided ureteral stent with inferior coil overlying the urinary bladder. Subsequent images demonstrate selective cannulation opacification of the distal aspect of the right ureter. There is a persistent ill-defined nonocclusive filling defect within the mid/distal aspect of the right ureter regional to the cranial aspect of the right sacral ala which correlates with the ureteral stone seen on preceding abdominal CT. There are multiple ill-defined nonocclusive filling defects within the dependent portion of the right renal pelvis compatible with the pelvic stone seen on preceding abdominal CT. Mild right-sided pelvicaliectasis Subsequent images demonstrate placement of a right-sided double-J ureteral stent with superior coil overlying the right renal pelvis and inferior coil overlying the urinary bladder. IMPRESSION: 1. Bilateral retrograde pyelogram and double-J ureteral stent placement as detailed above. 2. Suspected nonocclusive filling defects within in the caudal aspect of the left renal pelvis compatible with the nonobstructing stones demonstrated on preceding abdominal CT. 3. Nonocclusive filling defects within the right ureter and right renal pelvis with associated mild right-sided pelvicaliectasis compatible with the renal stones as seen on preceding abdominal CT. Electronically Signed   By: Simonne Come M.D.   On: 01/18/2015 17:09   Dg Chest Portable 1  View  01/17/2015  CLINICAL DATA:  Coffee-ground emesis. Mild shortness of breath. Pain. EXAM: PORTABLE CHEST 1 VIEW COMPARISON:  10/07/2013 FINDINGS: Chronic cardiopericardial enlargement. Unchanged aortic and hilar contours when accounting for rotation. Right infrahilar density is likely vessels, accentuated by rotation. There is no edema, consolidation, effusion, or pneumothorax. Chronic diaphragm flattening. IMPRESSION: No acute finding. Electronically Signed   By: Marnee Spring M.D.   On: 01/17/2015 15:48   Ct Renal Stone Study  01/17/2015  CLINICAL DATA:  Abdominal pain, hurts all over EXAM: CT ABDOMEN AND PELVIS WITHOUT CONTRAST TECHNIQUE: Multidetector CT imaging of the abdomen and pelvis was performed following the standard protocol without IV contrast. COMPARISON:  10/12/2014 FINDINGS: There is diffuse osteopenia. Degenerative changes are noted thoracic and lumbar spine. There are streaky artifacts from patient's large body habitus. The lung bases are unremarkable. There is intrahepatic pneumobilia probable post cholecystectomy. The patient is status postcholecystectomy. Some air noted within CBD. Unenhanced pancreas and spleen is unremarkable. Bilateral adrenal adenomas again noted. Atherosclerotic calcifications of abdominal aorta and iliac arteries are noted. No aortic aneurysm. There is nonobstructive calculus in midpole of the right kidney measures 5 mm. Nonobstructive calcification  in midpole of the left kidney measures 9 mm. There is mild right hydronephrosis and right hydroureter. Significant right perinephric stranding. Nonobstructive calculus in right renal pelvis measures 7 mm. Axial image 61 there is calcified obstructive calculus in mid right ureter measures 6 mm at the level of L5-S1 disc space. There is a second obstructive calculus in right ureter at the same level measures 6 mm. Best visualized in coronal image 51. There is no small bowel obstruction. No ascites or free air. No  adenopathy. Moderate stool noted in sigmoid colon. Moderate stool and gas noted within transverse colon. No pericecal inflammation. Normal appendix. The terminal ileum is unremarkable. The urinary bladder is empty limiting its assessment. Bilateral distal ureter is unremarkable. IMPRESSION: 1. There is bilateral nonobstructive nephrolithiasis. 2. Mild right hydronephrosis and right hydroureter. 3. Two adjacent obstructive calculi are noted in mid right ureter at L5-S1 level. The largest measures 6 mm. 4. Stable chronic pneumobilia probable post cholecystectomy. 5. Stable bilateral adrenal adenomas. 6. Limited assessment of urinary bladder which is empty. Electronically Signed   By: Natasha Mead M.D.   On: 01/17/2015 17:10    Scheduled Meds: . allopurinol  300 mg Oral Daily  . aspirin EC  81 mg Oral Daily  . Chlorhexidine Gluconate Cloth  6 each Topical Daily  . furosemide  40 mg Oral Daily  . heparin  5,000 Units Subcutaneous 3 times per day  . metolazone  2.5 mg Oral BID  . metoprolol tartrate  25 mg Oral BID  . mupirocin ointment  1 application Nasal BID  . pantoprazole  40 mg Oral Daily  . potassium chloride  10 mEq Oral Daily  . simvastatin  10 mg Oral q1800  . tamsulosin  0.4 mg Oral Daily  . traZODone  50 mg Oral QHS   Continuous Infusions: . sodium chloride 125 mL/hr at 01/19/15 1610    Principal Problem:   Pyelonephritis Active Problems:   UTI (lower urinary tract infection)   Gout   Morbid obesity (HCC)   Hydronephrosis   Lower extremity weakness   Essential hypertension   GERD without esophagitis    Time spent: 25 minutes. Greater than 50% of this time was spent in direct contact with the patient coordinating care.    Frederick Brown  [facility] Pager [phone]  If 7PM-7AM, please contact night-coverage at [email or web address], password [facility] 01/19/2015, 11:47 AM  LOS: 2 days

## 2015-01-19 NOTE — Evaluation (Signed)
Occupational Therapy Evaluation Patient Details Name: Frederick Brown MRN: [id number] DOB: [birth year] Today's Date: 01/19/2015    History of Present Illness Pt is a 64 y.o. male General ill feeling and intermittent fevers for the last 3-4 days. Febrile to 101/102. Emesis 1 shortly after ingestion of coffee couple of days ago. Intermittent dysuria. Denies hematuria or back pain. Tylenol without improvement. Decreased oral intake over this period time. Symptoms are constant and getting worse.Pt had stent placement on 01/18/2015.    Clinical Impression   PTA pt living alone, bed bound since May 2016, after undergoing removal of anterior pannus. He has had SNF and HHPT with no improvement. Pt reports that he wants to be able to get up out of the bed and do things for himself. Due to pt's PMH and lack of success with rehabilitation his rehab potential is limited.  Pt spends his days in bed and is transferred to MD appts by ambulance. Pt is requesting SNF on discharge, however I don't know that he would qualify. Recommend SNF or HHOT on discharge if able to qualify, if not LTC may be required. Pt reports pain in LUE since ambulance ride, unable to assess due to pain. No further acute OT services due to pt being at baseline with ADLs currently.              Precautions / Restrictions Precautions Precautions: Fall Precaution Comments: skin precautions Restrictions Weight Bearing Restrictions: No Other Position/Activity Restrictions: Pt is a 64 year old male admitted with pyelonephritis.  He is morbidly obese and has been bedbound for 6 months following removal of anterior pannus.  He underwent stent placement by Urology on 01-18-15.  nsive medical hx..  Pt lives alone and is cared for by neighbors.  He states that he would [facility] so that he can [facility].  He states that he has had HHPT since May and this has recently been d/c'd due to inadequate progress.  He has been to SNF  but made no improvement there as "they only stood me up one time".  When asked why he has not been able to transfer to a w/c he states that even with a sliding transfer it took 2 therapists to move him.      Mobility Bed Mobility Overal bed mobility: +2 for physical assistance             General bed mobility comments: not tested  Transfers                 General transfer comment: not tested                         Pertinent Vitals/Pain Pain Assessment: No/denies pain     Hand Dominance Right   Extremity/Trunk Assessment Upper Extremity Assessment Upper Extremity Assessment: Defer to OT evaluation;RUE deficits/detail RUE Deficits / Details: strength only 3+/5, ROM to WNL LUE Deficits / Details: Pt experiencing pain in LUE since ambulance ride. Pt unable to lift LUE, unable to tolerate P/ROM by OTR. ///Per PT, it is noted the pt lacks full elbow extension (20*) and has limited ability to flex the fingers   Lower Extremity Assessment Lower Extremity Assessment: RLE deficits/detail;LLE deficits/detail RLE Deficits / Details: profound weakness, 2+/5 at the hip; [facility]* knee flexion  / pt has pain in the knee with ROM LLE Deficits / Details: profound weakness, 2+/5 throughout with limited knee  ROM and very painful       Communication Communication Communication: No difficulties   Cognition Arousal/Alertness: Awake/alert Behavior During Therapy: WFL for tasks assessed/performed Overall Cognitive Status: Within Functional Limits for tasks assessed                                Home Living Family/patient expects to be discharged to:: Private residence Living Arrangements: Alone Available Help at Discharge: Friend(s);Home health;Available PRN/intermittently Type of Home: House       Home Layout: One level               Home Equipment: Wheelchair - [employer] - 4 wheels;Walker - 2 wheels          Prior  Functioning/Environment Level of Independence: Needs assistance  Gait / Transfers Assistance Needed: bed bound since May 2016, unable to transfer ADL's / Homemaking Assistance Needed: Pt has a neighbor who brings his meals. A nurse comes in to bathe and assist in toileting hygiene when pt calls her.          End of Session    Activity Tolerance:  Pt limited by pain Patient left:  In bed, call bell within reach, visitor in room    Time: [phone] OT Time Calculation (min): 23 min Charges:  OT General Charges $OT Visit: 1 Procedure OT Evaluation $Initial OT Evaluation Tier I: 1 Procedure  Ezra Sites, OTR/L  [phone]  01/19/2015, 4:41 PM

## 2015-01-19 NOTE — Evaluation (Signed)
Physical Therapy Evaluation Patient Details Name: Frederick Brown MRN: [id number] DOB: 06-06-1950 Today's Date: 01/19/2015   History of Present Illness  Pt is a 64 y.o. male General ill feeling and intermittent fevers for the last 3-4 days. Febrile to 101/102. Emesis 1 shortly after ingestion of coffee couple of days ago. Intermittent dysuria. Denies hematuria or back pain. Tylenol without improvement. Decreased oral intake over this period time. Symptoms are constant and getting worse.Pt had stent placement on 01/18/2015. Pt underwent stenting yesterday per Urology.  Clinical Impression   Pt was seen today for evaluation.  He has a complicated history regarding his functional status and has been living alone completely bed bound for the past 6 month.  He had the removal of anterior pannus in May 2016 and states that he became so weak that he became bed bound.  He has had SNF and HHPT with no improvement.  He feels that he failed SNF because they didn't do enough to help him.  HHPT , he feels, had to stop too early in their endeavor.  He wants to be able to ambulate.  With the pt's morbid obesity, profound weakness and significant DJD in his knees, in my opinion his rehab potential is poor.  It is hard to understand why he has not been able to transfer to a w/c and this should be a reasonable goal.  I was very honest with him about my assessment but he is still interested in going to SNF (which is why I am including this in my recommendation).  One more try at [place] is reasonable in order to see if he can make any progress with transfers but ultimately he may need to transition to NH care.    Follow Up Recommendations Home health PT;SNF (SNF request per pt request)    Equipment Recommendations  None recommended by PT    Recommendations for Other Services   OT    Precautions / Restrictions Precautions Precautions: Fall Precaution Comments: skin precautions Restrictions Weight Bearing  Restrictions: No Other Position/Activity Restrictions: none      Mobility  Bed Mobility Overal bed mobility: +2 for physical assistance             General bed mobility comments: total assist  Transfers      Will need a lift device               General transfer comment: not tested  Ambulation/Gait      non ambulatory                               Balance    not tested                                         Pertinent Vitals/Pain Pain Assessment: No/denies pain    Home Living Family/patient expects to be discharged to:: Private residence Living Arrangements: Alone Available Help at Discharge: Friend(s);Home health;Available PRN/intermittently Type of Home: House       Home Layout: One level Home Equipment: Wheelchair - [employer] - 4 wheels;Walker - 2 wheels      Prior Function Level of Independence: Needs assistance   Gait / Transfers Assistance Needed: bed bound since May 2016, unable to transfer  ADL's / Homemaking Assistance Needed: Pt has a neighbor who brings his meals. A nurse  comes in to bathe and assist in toileting hygiene when pt calls her.         Hand Dominance   Dominant Hand: Right    Extremity/Trunk Assessment   Upper Extremity Assessment: Defer to OT evaluation;RUE deficits/detail RUE Deficits / Details: strength only 3+/5, ROM to WNL     LUE Deficits / Details: Pt experiencing pain in LUE since ambulance ride. Pt unable to lift LUE, unable to tolerate P/ROM by OTR. ///Per PT, it is noted the pt lacks full elbow extension (20*) and has limited ability to flex the fingers   Lower Extremity Assessment: RLE deficits/detail;LLE deficits/detail RLE Deficits / Details: profound weakness, 2+/5 at the hip; 3/5 at the [facility] only 70* knee flexion  / pt has pain in the knee with ROM LLE Deficits / Details: profound weakness, 2+/5 throughout with limited knee ROM and very painful      Communication   Communication: No difficulties  Cognition Arousal/Alertness: Awake/alert Behavior During Therapy: WFL for tasks assessed/performed Overall Cognitive Status: Within Functional Limits for tasks assessed                                    Assessment/Plan    PT Assessment All further PT needs can be met in the next venue of care  PT Diagnosis Generalized weakness   PT Problem List Decreased strength;Decreased activity tolerance;Decreased mobility;Obesity  PT Treatment Interventions     PT Goals (Current goals can be found in the Care Plan section) Acute Rehab PT Goals PT Goal Formulation: All assessment and education complete, DC therapy         Barriers to discharge  lives alone, bedbound                     End of Session   Activity Tolerance: Patient tolerated treatment well Patient left: in bed;with call bell/phone within reach Nurse Communication: Mobility status         Time: 1410-1500 PT Time Calculation (min) (ACUTE ONLY): 50 min   Charges:   PT Evaluation $Initial PT Evaluation Tier I: 1 Procedure     PT G CodesSable Feil  PT 01/19/2015, 4:26 PM [phone]

## 2015-01-19 NOTE — Anesthesia Postprocedure Evaluation (Signed)
  Anesthesia Post-op Note  Patient: Frederick Brown  Procedure(s) Performed: Procedure(s): CYSTOSCOPY WITH BILATERAL RETROGRADE PYELOGRAM, AND BILATERAL STENT PLACEMENT (Bilateral)  Patient Location:   Anesthesia Type:General  Level of Consciousness: awake, alert , oriented and patient cooperative  Airway and Oxygen Therapy: Patient Spontanous Breathing and Patient connected to nasal cannula oxygen  Post-op Pain: none  Post-op Assessment: Post-op Vital signs reviewed, Patient's Cardiovascular Status Stable, Respiratory Function Stable, Patent Airway, No signs of Nausea or vomiting, Adequate PO intake and Pain level controlled              Post-op Vital Signs: Reviewed and stable  Last Vitals:  Filed Vitals:   01/19/15 0646  BP: 96/67  Pulse: 74  Temp: 36.5 C  Resp: 20    Complications: No apparent anesthesia complications

## 2015-01-19 NOTE — Care Management Note (Signed)
Case Management Note  Patient Details  Name: Frederick CanterburyJames H Hauger MRN: [id number] Date of Birth: 07/14/1950  Subjective/Objective:                  Pt admitted from home with pyelonephritis. Pt lives alone and will return home at discharge. Pt stated that he has great neighbors who do his cooking and cleaning, shopping for pt. Pt stated he has not been able to walk in about 5 months.  Action/Plan: Awaiting PT consult. Pt inquiring about SNF. Explained placement process to pt.   Expected Discharge Date:                  Expected Discharge Plan:  Home w Home Health Services  In-House Referral:  NA  Discharge planning Services  CM Consult  Post Acute Care Choice:    Choice offered to:     DME Arranged:    DME Agency:     HH Arranged:    HH Agency:     Status of Service:  In process, will continue to follow  Medicare Important Message Given:    Date Medicare IM Given:    Medicare IM give by:    Date Additional Medicare IM Given:    Additional Medicare Important Message give by:     If discussed at Long Length of Stay Meetings, dates discussed:    Additional Comments:  [name], Giah Fickett Crowder, RN 01/19/2015, 1:36 PM

## 2015-01-20 ENCOUNTER — Inpatient Hospital Stay
Admission: RE | Admit: 2015-01-20 | Discharge: 2015-02-17 | Disposition: A | Discharge status: Home / Self Care | Payer: Medicare Other | Source: Ambulatory Visit | Attending: Internal Medicine | Admitting: Internal Medicine

## 2015-01-20 DIAGNOSIS — K5900 Constipation, unspecified: Secondary | ICD-10-CM

## 2015-01-20 DIAGNOSIS — IMO0001 Reserved for inherently not codable concepts without codable children: Principal | ICD-10-CM

## 2015-01-20 LAB — URINE CULTURE

## 2015-01-20 LAB — T3, FREE: T3, Free: 1.8 pg/mL — ABNORMAL LOW (ref 2.0–4.4)

## 2015-01-20 MED ORDER — TAMSULOSIN HCL 0.4 MG PO CAPS: 0.4000 mg | ORAL_CAPSULE | Freq: Every day | ORAL | Status: DC

## 2015-01-20 MED ORDER — CEPHALEXIN 500 MG PO CAPS: 500.0000 mg | ORAL_CAPSULE | Freq: Three times a day (TID) | ORAL | Status: DC

## 2015-01-20 MED ORDER — OXYCODONE-ACETAMINOPHEN 5-325 MG PO TABS: 1.0000 | ORAL_TABLET | Freq: Four times a day (QID) | ORAL | Status: DC | PRN

## 2015-01-20 MED ORDER — OXYCODONE HCL 5 MG PO TABS: 5.0000 mg | ORAL_TABLET | Freq: Four times a day (QID) | ORAL | Status: DC | PRN

## 2015-01-20 NOTE — Care Management Note (Signed)
Case Management Note  Patient Details  Name: Frederick Brown MRN: [id number] Date of Birth: 04-Dec-1950  Subjective/Objective:                    Action/Plan:   Expected Discharge Date:                  Expected Discharge Plan:  Skilled Nursing Facility  In-House Referral:  Clinical Social Work  Discharge planning Services  CM Consult  Post Acute Care Choice:  NA Choice offered to:  NA  DME Arranged:    DME Agency:     HH Arranged:    HH Agency:     Status of Service:  Completed, signed off  Medicare Important Message Given:  Yes-second notification given Date Medicare IM Given:    Medicare IM give by:    Date Additional Medicare IM Given:    Additional Medicare Important Message give by:     If discussed at Long Length of Stay Meetings, dates discussed:    Additional Comments: [facility]. CSW to arrange discharge to facility. Arlyss QueenBlackwell, Reeva Davern [place], RN 01/20/2015, 12:39 PM

## 2015-01-20 NOTE — Clinical Social Work Placement (Signed)
   CLINICAL SOCIAL WORK PLACEMENT  NOTE  Date:  01/20/2015  Patient Details  Name: Frederick CanterburyJames H Woolstenhulme MRN: [id number] Date of Birth: 1950-04-12  Clinical Social Work is seeking post-discharge placement for this patient at the Skilled  Nursing Facility level of care (*CSW will initial, date and re-position this form in  chart as items are completed):  Yes   Patient/family provided with [facility] Clinical Social Work Department's list of facilities offering this level of care within the geographic area requested by the patient (or if unable, by the patient's family).  Yes   Patient/family informed of their freedom to choose among providers that offer the needed level of care, that participate in Medicare, Medicaid or managed care program needed by the patient, have an available bed and are willing to accept the patient.  Yes   Patient/family informed of [facility]'s ownership interest [facility] and [facility], as well as of the fact that they are under no obligation to receive care at these facilities.  PASRR submitted to EDS on       PASRR number received on       Existing PASRR number confirmed on 01/20/15     FL2 transmitted to all facilities in geographic area requested by pt/family on 01/20/15     FL2 transmitted to all facilities within larger geographic area on       Patient informed that his/her managed care company has contracts with or will negotiate with certain facilities, including the following:        Yes   Patient/family informed of bed offers received.  [name]     Physician recommends and patient chooses bed at      Patient to [facility] on 01/20/15.  Patient to be transferred to facility by staff     Patient family notified on 01/20/15 of transfer.  Name of family member notified:  pt calling family himself now     PHYSICIAN       Additional Comment:  [employer] Medicare  authorization: [id number]. Next review: 10/23. RUG level: RVC.   _______________________________________________ Karn CassisStultz, Audelia Knape Shanaberger, LCSW 01/20/2015, 12:41 PM [phone]

## 2015-01-20 NOTE — Care Management Important Message (Signed)
Important Message  Patient Details  Name: Frederick Brown MRN: [id number] Date of Birth: 1950/05/09   Medicare Important Message Given:  Yes-second notification given    [name], My Madariaga Crowder, RN 01/20/2015, 12:38 PM

## 2015-01-20 NOTE — Clinical Social Work Placement (Signed)
   CLINICAL SOCIAL WORK PLACEMENT  NOTE  Date:  01/20/2015  Patient Details  Name: Frederick Brown MRN: [id number] Date of Birth: [birth year]  Clinical Social Work is seeking post-discharge placement for this patient at the Skilled  Nursing Facility level of care (*CSW will initial, date and re-position this form in  chart as items are completed):  Yes   Patient/family provided with [facility] Clinical Social Work Department's list of facilities offering this level of care within the geographic area requested by the patient (or if unable, by the patient's family).  Yes   Patient/family informed of their freedom to choose among providers that offer the needed level of care, that participate in Medicare, Medicaid or managed care program needed by the patient, have an available bed and are willing to accept the patient.  Yes   Patient/family informed of [facility]'s ownership interest in [facility] and [facility], as well as of the fact that they are under no obligation to receive care at these facilities.  PASRR submitted to EDS on       PASRR number received on       Existing PASRR number confirmed on 01/20/15     FL2 transmitted to all facilities in geographic area requested by pt/family on 01/20/15     FL2 transmitted to all facilities within larger geographic area on       Patient informed that his/her managed care company has contracts with or will negotiate with certain facilities, including the following:            Patient/family informed of bed offers received.  Patient chooses bed at       Physician recommends and patient chooses bed at      Patient to be transferred to   on  .  Patient to be transferred to facility by       Patient family notified on   of transfer.  Name of family member notified:        PHYSICIAN       Additional Comment:    _______________________________________________ Karn CassisStultz, Esraa Seres Shanaberger, LCSW 01/20/2015, 8:35  AM [phone]

## 2015-01-20 NOTE — Clinical Social Work Note (Signed)
Clinical Social Work Assessment  Patient Details  Name: Frederick Brown MRN: [id number] Date of Birth: 06/01/1950  Date of referral:  01/20/15               Reason for consult:  Facility Placement                Permission sought to share information with:    Permission granted to share information::     Name::        Agency::     Relationship::     Contact Information:     Housing/Transportation Living arrangements for the past 2 months:  Single Family Home Source of Information:  Patient Patient Interpreter Needed:  None Criminal Activity/Legal Involvement Pertinent to Current Situation/Hospitalization:  No - Comment as needed Significant Relationships:  Neighbor Lives with:  Self Do you feel safe going back to the place where you live?   (requests SNF) Need for family participation in patient care:  Yes (Comment) (Neighbors help out a lot)  Care giving concerns:  Pt is close to total care.    Social Worker assessment / plan:  CSW met with pt at bedside. Pt alert and oriented and reports he lives alone. His best support are several neighbors who provide food and take care of the house. Pt shared he has not walked in 5 months- after a panniculectomy. Pt went to rehab, but reports this was relatively unsuccessful. He has been bed bound and had home health, but states that this is supposed to stop this week. Pt is very hopeful that he can return to rehab. PT evaluated pt and recommend SNF vs home health. CSW discussed insurance authorization process and pt understands that it is possible that SNF will not be approved. He states he is willing to pay privately in order to give him one more chance. At this point, he is only interested in [facility]. Referral faxed and insurance authorization started.   Employment status:  Disabled (Comment on whether or not currently receiving Disability) Insurance information:  Managed Medicare PT Recommendations:  Skilled Nursing Facility, Home with Home  Health Information / Referral to community resources:  Skilled Nursing Facility  Patient/Family's Response to care:  Pt is hopeful that insurance will approve SNF so he can have another chance at rehab.   Patient/Family's Understanding of and Emotional Response to Diagnosis, Current Treatment, and Prognosis:  Pt reports understanding of admission diagnosis and treatment plan.   Emotional Assessment Appearance:  Appears stated age Attitude/Demeanor/Rapport:  Other (Cooperative) Affect (typically observed):  Hopeful Orientation:  [name], [name], [name], [name] Alcohol / Substance use:  Not Applicable Psych involvement (Current and /or in the community):  No (Comment)  Discharge Needs  Concerns to be addressed:  Discharge Planning Concerns Readmission within the last 30 days:  No Current discharge risk:  Dependent with Mobility Barriers to Discharge:  Continued Medical Work up   Stultz, Kara Shanaberger, LCSW 01/20/2015, 8:38 AM [phone] 

## 2015-01-20 NOTE — Progress Notes (Signed)
Pt discharged to [facility] in stable condition.  Pt is non-ambulatory.  Pt transported via bed. VS WNL.   Pt denies pain at this time.  Report called to [facility].

## 2015-01-20 NOTE — Progress Notes (Addendum)
Patient ID: Frederick Brown, male   DOB: 09/09/1950, 64 y.o.   MRN: [id number] 2 Days Post-Op  Subjective: Frederick Brown is POD2 following placement of bilateral ureteral stents for an obstructing right proximal ureteral stone and a left renal pelvic stone with sepsis.   He is improving and reports reduced pain.  He has some suprapubic discomfort.  He is afebrile.  The urine culture is growing >100K Proteus. ROS:  Review of Systems  Constitutional: Negative for fever and chills.  Respiratory: Negative for shortness of breath.   Cardiovascular: Negative for chest pain.  Gastrointestinal: Negative for nausea and vomiting.  Neurological:       He remains weak and unable to walk.    Anti-infectives: Anti-infectives    Start     Dose/Rate Route Frequency Ordered Stop   01/18/15 1800  cefTRIAXone (ROCEPHIN) 1 g in dextrose 5 % 50 mL IVPB     1 g Intravenous  Once 01/17/15 1922 01/18/15 1721   01/17/15 1800  cefTRIAXone (ROCEPHIN) 1 g in dextrose 5 % 50 mL IVPB     1 g Intravenous  Once 01/17/15 1749 01/17/15 1843      Current Facility-Administered Medications  Medication Dose Route Frequency Provider Last Rate Last Dose  . 0.9 %  sodium chloride infusion   Intravenous Continuous Ozella Rocks, MD 125 mL/hr at 01/20/15 0210    . allopurinol (ZYLOPRIM) tablet 300 mg  300 mg Oral Daily Ozella Rocks, MD   300 mg at 01/19/15 0951  . aspirin EC tablet 81 mg  81 mg Oral Daily Ozella Rocks, MD   81 mg at 01/19/15 0950  . Chlorhexidine Gluconate Cloth 2 % PADS 6 each  6 each Topical Daily Ozella Rocks, MD   6 each at 01/19/15 1001  . furosemide (LASIX) tablet 40 mg  40 mg Oral Daily Ozella Rocks, MD   40 mg at 01/19/15 0950  . heparin injection 5,000 Units  5,000 Units Subcutaneous 3 times per day Ozella Rocks, MD   5,000 Units at 01/20/15 [phone]  . metolazone (ZAROXOLYN) tablet 2.5 mg  2.5 mg Oral BID Ozella Rocks, MD   2.5 mg at 01/19/15 2205  . metoprolol tartrate (LOPRESSOR)  tablet 25 mg  25 mg Oral BID Ozella Rocks, MD   25 mg at 01/19/15 0950  . mupirocin ointment (BACTROBAN) 2 % 1 application  1 application Nasal BID Ozella Rocks, MD   1 application at 01/19/15 2207  . ondansetron (ZOFRAN) tablet 4 mg  4 mg Oral Q6H PRN Ozella Rocks, MD       Or  . ondansetron [facility]) injection 4 mg  4 mg Intravenous Q6H PRN Ozella Rocks, MD      . oxyCODONE-acetaminophen (PERCOCET/ROXICET) 5-325 MG per tablet 1 tablet  1 tablet Oral Q6H PRN Ozella Rocks, MD   1 tablet at 01/19/15 2355   And  . oxyCODONE (Oxy IR/ROXICODONE) immediate release tablet 5 mg  5 mg Oral Q6H PRN Ozella Rocks, MD   5 mg at 01/19/15 2355  . pantoprazole (PROTONIX) EC tablet 40 mg  40 mg Oral Daily Ozella Rocks, MD   40 mg at 01/19/15 0950  . potassium chloride SA (K-DUR,KLOR-CON) CR tablet 10 mEq  10 mEq Oral Daily Ozella Rocks, MD   10 mEq at 01/19/15 0950  . simvastatin (ZOCOR) tablet 10 mg  10 mg Oral q1800 Ozella Rocks, MD  10 mg at 01/19/15 1724  . tamsulosin (FLOMAX) capsule 0.4 mg  0.4 mg Oral Daily Ozella Rocks, MD   0.4 mg at 01/19/15 0950  . traZODone (DESYREL) tablet 50 mg  50 mg Oral QHS Ozella Rocks, MD   50 mg at 01/19/15 2158     Objective: Vital signs in last 24 hours: Temp:  [97.5 F (36.4 C)-98.4 F (36.9 C)] 97.5 F (36.4 C) (10/21 0555) Pulse Rate:  [95-113] 95 (10/21 0555) Resp:  [16-20] 16 (10/21 0555) BP: (98-103)/(58-67) 103/59 mmHg (10/21 0555) SpO2:  [97 %-98 %] 97 % (10/21 0555)  Intake/Output from previous day: 10/20 0701 - 10/21 0700 In: 2845 [P.O.:720; I.V.:2125] Out: 4200 [Urine:4200] Intake/Output this shift:     Physical Exam  Constitutional:  WD, Obese lying in bed.  A/O x 3 in NAD.  Cardiovascular: Normal rate, regular rhythm and normal heart sounds.   Pulmonary/Chest: Effort normal and breath sounds normal. No respiratory distress.  Abdominal:  Soft, obese with mod RUQ tenderness with guarding.   Mild lower  abdominal tenderness.    Genitourinary:  Foley is draining clear urine.     Lab Results:   Recent Labs  01/17/15 1535 01/19/15 0547  WBC 5.0 3.9*  HGB 13.2 10.6*  HCT 39.6 32.1*  PLT 197 182   BMET  Recent Labs  01/17/15 1535 01/19/15 0547  NA 138 137  K 3.9 3.2*  CL 102 103  CO2 25 27  GLUCOSE 105* 117*  BUN 16 23*  CREATININE 0.83 0.83  CALCIUM 10.3 9.8   PT/INR No results for input(s): LABPROT, INR in the last 72 hours. ABG No results for input(s): PHART, HCO3 in the last 72 hours.  Invalid input(s): PCO2, PO2  Studies/Results: Dg Retrograde Pyelogram  01/18/2015  CLINICAL DATA:  Bilateral ureteral calculi and stent placement EXAM: RETROGRADE PYELOGRAM COMPARISON:  CT abdomen pelvis - 01/17/2015; 10/12/2014 FINDINGS: Nine spot intraoperative fluoroscopic images during bilateral retrograde pyelogram and stent placement are provided for review. Initial image demonstrates selective cannulation opacification the distal aspect of the left ureter. There is marked tortuosity involving the distal aspect of the left ureter. There are several ill-defined filling defects within the caudal aspect the left renal pelvis, likely compatible with the nonobstructing stones seen on preceding abdominal CT. No definite evidence of left-sided pelvicaliectasis. Subsequent image demonstrate placement of a left-sided ureteral stent with inferior coil overlying the urinary bladder. Subsequent images demonstrate selective cannulation opacification of the distal aspect of the right ureter. There is a persistent ill-defined nonocclusive filling defect within the mid/distal aspect of the right ureter regional to the cranial aspect of the right sacral ala which correlates with the ureteral stone seen on preceding abdominal CT. There are multiple ill-defined nonocclusive filling defects within the dependent portion of the right renal pelvis compatible with the pelvic stone seen on preceding abdominal CT.  Mild right-sided pelvicaliectasis Subsequent images demonstrate placement of a right-sided double-J ureteral stent with superior coil overlying the right renal pelvis and inferior coil overlying the urinary bladder. IMPRESSION: 1. Bilateral retrograde pyelogram and double-J ureteral stent placement as detailed above. 2. Suspected nonocclusive filling defects within in the caudal aspect of the left renal pelvis compatible with the nonobstructing stones demonstrated on preceding abdominal CT. 3. Nonocclusive filling defects within the right ureter and right renal pelvis with associated mild right-sided pelvicaliectasis compatible with the renal stones as seen on preceding abdominal CT. Electronically Signed   By: [place].D.  On: 01/18/2015 17:09   Urine culture reviewed and noted above.    Assessment and Plan: Sepsis is resolving with renal stenting and rocephin. He has proteus in the urine so his stones could be struvite.   The foley can be removed when no longer needed for medical management.  He is scheduled for ureteroscopy in about 2 weeks.   Please maintain culture specific antibiotics until the procedure.         LOS: 3 days    Anner CreteWRENN,Ante Arredondo J 01/20/2015 [phone]

## 2015-01-20 NOTE — Discharge Summary (Signed)
Physician Discharge Summary  Frederick Brown ZOX:[id number] DOB: 18-Jun-1950 DOA: 01/17/2015  PCP: Trinna Post, MD  Admit date: 01/17/2015 Discharge date: 01/20/2015  Time spent: 45 minutes  Recommendations for Outpatient Follow-up:  -Will be discharged to SNF today. -Will need to follow up with Dr. Annabell Howells, urology, in 2 weeks. Should stay on keflex until seen by him.   Discharge Diagnoses:  Principal Problem:   Pyelonephritis Active Problems:   UTI (lower urinary tract infection)   Gout   Morbid obesity (HCC)   Hydronephrosis   Lower extremity weakness   Essential hypertension   GERD without esophagitis   Discharge Condition: Stable and improved  Filed Weights   01/17/15 1504 01/17/15 1958 01/18/15 1348  Weight: 157.398 kg (347 lb) 151.048 kg (333 lb) 151.048 kg (333 lb)    History of present illness:  As per Dr. Konrad Dolores 10/18: Frederick Brown is a 64 y.o. male  General ill feeling and intermittent fevers for the last 3-4 days. Febrile to 101/102. Emesis 1 shortly after ingestion of coffee couple of days ago. Intermittent dysuria. Denies hematuria or back pain. Tylenol without improvement. Decreased oral intake over this period time. Symptoms are constant and getting worse.  Hospital Course:   Pyelonephritis -Cx with proteus. Will DC on keflex. -Urology has recommended he remain on keflex until seen by them in 2 weeks. -Had bilateral stent placement 10/19. -Will need follow up with urology for definitive stone management.  Generalized Weakness -Suspect 2/2 acute illness. -TSH low, mfree T4 and free T3 pending, ??hypothyroidism -Per PT, SNF.  Chronic A Fib -Continue metoprolol. -Not on chronic anticoagulation.  HTN -Fair control. -Do not anticipate medication changes while in the hospital.  Gout -Continue allopurinol.  GERD -Continue PPI  Left Shoulder/Elbow Pain -Has good ROM (doubt an xray would be useful). -Seen by  PT/OT.   Procedures:  Bilateral ureteral Stent placement   Consultations:  Urology  Discharge Instructions  Discharge Instructions    Diet - low sodium heart healthy    Complete by:  As directed      Foley catheter - discontinue    Complete by:  As directed      Increase activity slowly    Complete by:  As directed             Medication List    STOP taking these medications        oxyCODONE-acetaminophen 10-325 MG tablet  Commonly known as:  PERCOCET  Replaced by:  oxyCODONE-acetaminophen 5-325 MG tablet      TAKE these medications        acetaminophen 500 MG tablet  Commonly known as:  TYLENOL  Take 1,000 mg by mouth every 6 (six) hours as needed for mild pain.     allopurinol 300 MG tablet  Commonly known as:  ZYLOPRIM  Take 300 mg by mouth daily.     aspirin EC 81 MG tablet  Take 81 mg by mouth daily.     cephALEXin 500 MG capsule  Commonly known as:  KEFLEX  Take 1 capsule (500 mg total) by mouth 3 (three) times daily. Until seen by Dr. Annabell Howells     COLCRYS 0.6 MG tablet  Generic drug:  colchicine  Take 1 tablet by mouth every 12 (twelve) hours as needed (for gout).     furosemide 40 MG tablet  Commonly known as:  LASIX  Take 40 mg by mouth daily.     metolazone 2.5 MG tablet  Commonly known as:  ZAROXOLYN  Take 2.5 mg by mouth 2 (two) times daily.     metoprolol tartrate 25 MG tablet  Commonly known as:  LOPRESSOR  Take 25 mg by mouth 2 (two) times daily.     omeprazole 20 MG capsule  Commonly known as:  PRILOSEC  Take 20 mg by mouth every other day.     oxyCODONE 5 MG immediate release tablet  Commonly known as:  Oxy IR/ROXICODONE  Take 1 tablet (5 mg total) by mouth every 6 (six) hours as needed for moderate pain.     oxyCODONE-acetaminophen 5-325 MG tablet  Commonly known as:  PERCOCET/ROXICET  Take 1 tablet by mouth every 6 (six) hours as needed for moderate pain.     potassium chloride 10 MEQ CR capsule  Commonly known as:   MICRO-K  Take 10 mEq by mouth daily.     simvastatin 10 MG tablet  Commonly known as:  ZOCOR  Take 10 mg by mouth daily.     tamsulosin 0.4 MG Caps capsule  Commonly known as:  FLOMAX  Take 1 capsule (0.4 mg total) by mouth daily.     traZODone 50 MG tablet  Commonly known as:  DESYREL  Take 50 mg by mouth at bedtime.       Allergies  Allergen Reactions  . Ciprofloxacin Rash    Patient is not aware of the following allergy       Follow-up Information    Follow up with Anner Crete, MD. Schedule an appointment as soon as possible for a visit in 2 weeks.   Specialty:  Urology   Contact information:   [address] [place] [address] [phone]        The results of significant diagnostics from this hospitalization (including imaging, microbiology, ancillary and laboratory) are listed below for reference.    Significant Diagnostic Studies: Dg Retrograde Pyelogram  01/18/2015  CLINICAL DATA:  Bilateral ureteral calculi and stent placement EXAM: RETROGRADE PYELOGRAM COMPARISON:  CT abdomen pelvis - 01/17/2015; 10/12/2014 FINDINGS: Nine spot intraoperative fluoroscopic images during bilateral retrograde pyelogram and stent placement are provided for review. Initial image demonstrates selective cannulation opacification the distal aspect of the left ureter. There is marked tortuosity involving the distal aspect of the left ureter. There are several ill-defined filling defects within the caudal aspect the left renal pelvis, likely compatible with the nonobstructing stones seen on preceding abdominal CT. No definite evidence of left-sided pelvicaliectasis. Subsequent image demonstrate placement of a left-sided ureteral stent with inferior coil overlying the urinary bladder. Subsequent images demonstrate selective cannulation opacification of the distal aspect of the right ureter. There is a persistent ill-defined nonocclusive filling defect within the mid/distal aspect of  the right ureter regional to the cranial aspect of the right sacral ala which correlates with the ureteral stone seen on preceding abdominal CT. There are multiple ill-defined nonocclusive filling defects within the dependent portion of the right renal pelvis compatible with the pelvic stone seen on preceding abdominal CT. Mild right-sided pelvicaliectasis Subsequent images demonstrate placement of a right-sided double-J ureteral stent with superior coil overlying the right renal pelvis and inferior coil overlying the urinary bladder. IMPRESSION: 1. Bilateral retrograde pyelogram and double-J ureteral stent placement as detailed above. 2. Suspected nonocclusive filling defects within in the caudal aspect of the left renal pelvis compatible with the nonobstructing stones demonstrated on preceding abdominal CT. 3. Nonocclusive filling defects within the right ureter and right renal pelvis with associated  mild right-sided pelvicaliectasis compatible with the renal stones as seen on preceding abdominal CT. Electronically Signed   By: Simonne ComeJohn  Watts M.D.   On: 01/18/2015 17:09   Dg Chest Portable 1 View  01/17/2015  CLINICAL DATA:  Coffee-ground emesis. Mild shortness of breath. Pain. EXAM: PORTABLE CHEST 1 VIEW COMPARISON:  10/07/2013 FINDINGS: Chronic cardiopericardial enlargement. Unchanged aortic and hilar contours when accounting for rotation. Right infrahilar density is likely vessels, accentuated by rotation. There is no edema, consolidation, effusion, or pneumothorax. Chronic diaphragm flattening. IMPRESSION: No acute finding. Electronically Signed   By: Marnee SpringJonathon  Watts M.D.   On: 01/17/2015 15:48   Ct Renal Stone Study  01/17/2015  CLINICAL DATA:  Abdominal pain, hurts all over EXAM: CT ABDOMEN AND PELVIS WITHOUT CONTRAST TECHNIQUE: Multidetector CT imaging of the abdomen and pelvis was performed following the standard protocol without IV contrast. COMPARISON:  10/12/2014 FINDINGS: There is diffuse  osteopenia. Degenerative changes are noted thoracic and lumbar spine. There are streaky artifacts from patient's large body habitus. The lung bases are unremarkable. There is intrahepatic pneumobilia probable post cholecystectomy. The patient is status postcholecystectomy. Some air noted within CBD. Unenhanced pancreas and spleen is unremarkable. Bilateral adrenal adenomas again noted. Atherosclerotic calcifications of abdominal aorta and iliac arteries are noted. No aortic aneurysm. There is nonobstructive calculus in midpole of the right kidney measures 5 mm. Nonobstructive calcification in midpole of the left kidney measures 9 mm. There is mild right hydronephrosis and right hydroureter. Significant right perinephric stranding. Nonobstructive calculus in right renal pelvis measures 7 mm. Axial image 61 there is calcified obstructive calculus in mid right ureter measures 6 mm at the level of L5-S1 disc space. There is a second obstructive calculus in right ureter at the same level measures 6 mm. Best visualized in coronal image 51. There is no small bowel obstruction. No ascites or free air. No adenopathy. Moderate stool noted in sigmoid colon. Moderate stool and gas noted within transverse colon. No pericecal inflammation. Normal appendix. The terminal ileum is unremarkable. The urinary bladder is empty limiting its assessment. Bilateral distal ureter is unremarkable. IMPRESSION: 1. There is bilateral nonobstructive nephrolithiasis. 2. Mild right hydronephrosis and right hydroureter. 3. Two adjacent obstructive calculi are noted in mid right ureter at L5-S1 level. The largest measures 6 mm. 4. Stable chronic pneumobilia probable post cholecystectomy. 5. Stable bilateral adrenal adenomas. 6. Limited assessment of urinary bladder which is empty. Electronically Signed   By: Natasha MeadLiviu  Pop M.D.   On: 01/17/2015 17:10    Microbiology: Recent Results (from the past 240 hour(s))  Urine culture     Status: None    Collection Time: 01/17/15  4:37 PM  Result Value Ref Range Status   Specimen Description URINE, CLEAN CATCH  Final   Special Requests NONE  Final   Culture   Final    >=100,000 COLONIES/mL PROTEUS MIRABILIS Performed at [facility]    Report Status 01/20/2015 FINAL  Final   Organism ID, Bacteria PROTEUS MIRABILIS  Final      Susceptibility   Proteus mirabilis - MIC*    AMPICILLIN <=2 SENSITIVE Sensitive     CEFAZOLIN <=4 SENSITIVE Sensitive     CEFTRIAXONE <=1 SENSITIVE Sensitive     CIPROFLOXACIN <=0.25 SENSITIVE Sensitive     GENTAMICIN <=1 SENSITIVE Sensitive     IMIPENEM 2 SENSITIVE Sensitive     NITROFURANTOIN 128 RESISTANT Resistant     TRIMETH/SULFA <=20 SENSITIVE Sensitive     AMPICILLIN/SULBACTAM <=2 SENSITIVE Sensitive  PIP/TAZO <=4 SENSITIVE Sensitive     * >=100,000 COLONIES/mL PROTEUS MIRABILIS  Surgical pcr screen     Status: Abnormal   Collection Time: 01/17/15 10:18 PM  Result Value Ref Range Status   MRSA, PCR NEGATIVE NEGATIVE Final   Staphylococcus aureus POSITIVE (A) NEGATIVE Final    Comment:        The Xpert SA Assay (FDA approved for NASAL specimens in patients over 18 years of age), is one component of a comprehensive surveillance program.  Test performance has been validated by [facility] for patients greater than or equal to 57 year old. It is not intended to diagnose infection nor to guide or monitor treatment.      Labs: Basic Metabolic Panel:  Recent Labs Lab 01/17/15 1535 01/19/15 0547  NA 138 137  K 3.9 3.2*  CL 102 103  CO2 25 27  GLUCOSE 105* 117*  BUN 16 23*  CREATININE 0.83 0.83  CALCIUM 10.3 9.8   Liver Function Tests:  Recent Labs Lab 01/17/15 1535  AST 46*  ALT 49  ALKPHOS 142*  BILITOT 0.7  PROT 7.1  ALBUMIN 3.0*    Recent Labs Lab 01/17/15 1535  LIPASE 22   No results for input(s): AMMONIA in the last 168 hours. CBC:  Recent Labs Lab 01/17/15 1535 01/19/15 0547  WBC 5.0 3.9*   NEUTROABS 4.3  --   HGB 13.2 10.6*  HCT 39.6 32.1*  MCV 90.4 89.2  PLT 197 182   Cardiac Enzymes: No results for input(s): CKTOTAL, CKMB, CKMBINDEX, TROPONINI in the last 168 hours. BNP: BNP (last 3 results) No results for input(s): BNP in the last 8760 hours.  ProBNP (last 3 results) No results for input(s): PROBNP in the last 8760 hours.  CBG: No results for input(s): GLUCAP in the last 168 hours.     SignedChaya Jan  [facility] Pager: [phone] 01/20/2015, 12:25 PM

## 2015-01-21 ENCOUNTER — Other Ambulatory Visit (HOSPITAL_COMMUNITY)
Admission: RE | Admit: 2015-01-21 | Discharge: 2015-01-21 | Disposition: A | Discharge status: Home / Self Care | Payer: Medicare Other | Source: Skilled Nursing Facility | Attending: Internal Medicine | Admitting: Internal Medicine

## 2015-01-21 DIAGNOSIS — I10 Essential (primary) hypertension: Secondary | ICD-10-CM | POA: Insufficient documentation

## 2015-01-21 LAB — CBC WITH DIFFERENTIAL/PLATELET
BASOS ABS: 0 10*3/uL (ref 0.0–0.1)
Basophils Relative: 1 %
EOS PCT: 2 %
Eosinophils Absolute: 0.1 10*3/uL (ref 0.0–0.7)
HCT: 33.5 % — ABNORMAL LOW (ref 39.0–52.0)
Hemoglobin: 11 g/dL — ABNORMAL LOW (ref 13.0–17.0)
LYMPHS PCT: 31 %
Lymphs Abs: 1.3 10*3/uL (ref 0.7–4.0)
MCH: 29.2 pg (ref 26.0–34.0)
MCHC: 32.8 g/dL (ref 30.0–36.0)
MCV: 88.9 fL (ref 78.0–100.0)
Monocytes Absolute: 0.5 10*3/uL (ref 0.1–1.0)
Monocytes Relative: 11 %
NEUTROS ABS: 2.3 10*3/uL (ref 1.7–7.7)
NEUTROS PCT: 55 %
PLATELETS: 203 10*3/uL (ref 150–400)
RBC: 3.77 MIL/uL — AB (ref 4.22–5.81)
RDW: 14.5 % (ref 11.5–15.5)
WBC: 4.1 10*3/uL (ref 4.0–10.5)

## 2015-01-21 LAB — BASIC METABOLIC PANEL
ANION GAP: 9 (ref 5–15)
BUN: 23 mg/dL — ABNORMAL HIGH (ref 6–20)
CO2: 29 mmol/L (ref 22–32)
Calcium: 10 mg/dL (ref 8.9–10.3)
Chloride: 100 mmol/L — ABNORMAL LOW (ref 101–111)
Creatinine, Ser: 0.84 mg/dL (ref 0.61–1.24)
Glucose, Bld: 96 mg/dL (ref 65–99)
POTASSIUM: 3.3 mmol/L — AB (ref 3.5–5.1)
SODIUM: 138 mmol/L (ref 135–145)

## 2015-01-23 ENCOUNTER — Non-Acute Institutional Stay (SKILLED_NURSING_FACILITY): Payer: Medicare Other | Admitting: Internal Medicine

## 2015-01-23 DIAGNOSIS — R29898 Other symptoms and signs involving the musculoskeletal system: Secondary | ICD-10-CM | POA: Diagnosis not present

## 2015-01-23 DIAGNOSIS — E662 Morbid (severe) obesity with alveolar hypoventilation: Secondary | ICD-10-CM

## 2015-01-23 DIAGNOSIS — N200 Calculus of kidney: Secondary | ICD-10-CM | POA: Diagnosis not present

## 2015-01-23 DIAGNOSIS — G609 Hereditary and idiopathic neuropathy, unspecified: Secondary | ICD-10-CM

## 2015-01-23 DIAGNOSIS — N10 Acute tubulo-interstitial nephritis: Secondary | ICD-10-CM

## 2015-01-23 NOTE — Progress Notes (Signed)
Patient ID: Frederick Brown, male   DOB: 1950-12-01, 64 y.o.   MRN: [id number]    Facility; [facility] Chief complaint; admission to SNF post [facility] from 10/18 to 01/20/15  History; this patient was admitted with fever nausea vomiting. He was diagnosed with the Proteus pyelonephritis treated with antibiotics and put on Keflex. He apparently has nephrolithiasis and required bilateral stent placement on 10/19. CT renals showed bilateral nonobstructive nephrolithiasis mild right hydroureter and right hydronephrosis. There were 2 adjacent obstructive calculi in the mid right ureter at L5-S1. He was also noted to have stable bilateral adrenal adenomas and stable chronic pneumobilia postcholecystectomy cystectomy.   The [place] in [place] in [place] own home. He is apparently essentially bedbound although he lives alone. He has neighbors who come in and help him as well as home health aides. He tells me that he had extensive lower abdominal surgery to remove subcutaneous fat several months ago at [facility]. He states that he is not able to walk since then. Prior to the surgery he was able to get up walk with a walker. Able to drive himself around he has not been able to do that since. He is followed up with a Dr. Jacinto HalimGanji who is neurologist at [facility] in. He thinks he has some form of neuropathy although he is not a diabetic as mentioned he is essentially bedbound at home.   Past Medical History  Diagnosis Date  . Hypertension   . Obesity, morbid (more than 100 lbs over ideal weight or BMI > 40) (HCC)   . Degenerative joint disease   . Alcohol abuse   . Umbilical hernia   . Gastroesophageal reflux disease   . Vitamin B12 deficiency 05/22/2011  . Fracture of metatarsal of left foot, closed 05/28/2011  . Fracture of great toe, left, closed 05/28/2011  . Chronic respiratory failure with hypoxia (HCC) 05/28/2011  . Urticarial rash 05/31/2011    From Cipro  . Cor pulmonale, chronic [facility](HCC) February  2013  . Obesity hypoventilation syndrome [facility](HCC) February 2013  . Pickwickian syndrome (HCC)   . CHF (congestive heart failure) (HCC)   . A-fib [facility](HCC)    Past Surgical History  Procedure Laterality Date  . Cholecystectomy    . Knee surgery    . Cystoscopy    . Cystoscopy with retrograde pyelogram, ureteroscopy and stent placement Bilateral 01/18/2015    Procedure: CYSTOSCOPY WITH BILATERAL RETROGRADE PYELOGRAM, AND BILATERAL STENT PLACEMENT;  Surgeon: Malen GauzePatrick L McKenzie, MD;  Location: [facility];  Service: Urology;  Laterality: Bilateral;    Current Outpatient Prescriptions on File Prior to Visit  Medication Sig Dispense Refill  . acetaminophen (TYLENOL) 500 MG tablet Take 1,000 mg by mouth every 6 (six) hours as needed for mild pain.    Marland Kitchen. allopurinol (ZYLOPRIM) 300 MG tablet Take 300 mg by mouth daily.    Marland Kitchen. aspirin EC 81 MG tablet Take 81 mg by mouth daily.    . cephALEXin (KEFLEX) 500 MG capsule Take 1 capsule (500 mg total) by mouth 3 (three) times daily. Until seen by Dr. Annabell HowellsWrenn    . COLCRYS 0.6 MG tablet Take 1 tablet by mouth every 12 (twelve) hours as needed (for gout).   10  . furosemide (LASIX) 40 MG tablet Take 40 mg by mouth daily.    . metolazone (ZAROXOLYN) 2.5 MG tablet Take 2.5 mg by mouth 2 (two) times daily.     . metoprolol tartrate (LOPRESSOR) 25 MG tablet Take 25 mg  by mouth 2 (two) times daily.    Marland Kitchen omeprazole (PRILOSEC) 20 MG capsule Take 20 mg by mouth every other day.     . oxyCODONE (OXY IR/ROXICODONE) 5 MG immediate release tablet Take 1 tablet (5 mg total) by mouth every 6 (six) hours as needed for moderate pain. 30 tablet 0  . oxyCODONE-acetaminophen (PERCOCET/ROXICET) 5-325 MG tablet Take 1 tablet by mouth every 6 (six) hours as needed for moderate pain. 20 tablet 0  . potassium chloride (MICRO-K) 10 MEQ CR capsule Take 10 mEq by mouth daily.    . simvastatin (ZOCOR) 10 MG tablet Take 10 mg by mouth daily.    . tamsulosin (FLOMAX) 0.4 MG CAPS capsule Take 1 capsule  (0.4 mg total) by mouth daily. 30 capsule 2  . traZODone (DESYREL) 50 MG tablet Take 50 mg by mouth at bedtime.     Socially; lives in his own home and [place]. As mentioned according to him is essentially bedbound. He brought himself his own lift but I don't know that there is anybody there to help him use it. He has a home health aide and neighbors who come and help him prepare food etc.  reports that he has never smoked. He does not have any smokeless tobacco history on file. He reports that he drinks about 0.6 oz of alcohol per week. He reports that he does not use illicit drugs.  family history includes Heart attack in his father; Heart failure in his father; Hypertension in his father; Stroke in his father.  Review of systems Gen. patient is not in any distress. States he has lost a large amount await I'm not exactly sure how much of this was post surgical HEENT no headache or visual concerns Respiratory no shortness of breath Cardiac no chest pain he is on a large amount of diuretic including Lasix 40 and Zaroxolyn GI no abdominal pain nausea vomiting or diarrhea GU no dysuria Musculoskeletal as a history of gout but nothing active. No significant back pain Neurologic; patient states he has weakness and numbness and tingling in his lower legs. This is a been of recent onset and he is following with neurology at [facility]. It makes it impossible for him to stand Mental status; no depressive references Endocrine; he is not a diabetic appears to have gynecomastia predominantly on the left GU; does not really relate previous voiding difficulties  Physical examination Gen. morbidly obese man not in any distress HEENT oral exam is normal Neck no lymph node thyroid Respiratory clear entry bilaterally Cardiac heart sounds are normal no murmurs. No evidence of heart failure Breasts; has a lot of subcutaneous tissue in the left breast area whether this represents gynecomastia totally as I  didn't actually feel breast tissue there is no nodule. Lymph nonpalpable in the cervical clavicular or axillary areas Abdomen; morbidly obese diffusely tender no liver no spleen no masses GU no suprapubic or costovertebral angle tenderness currently. 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 He does not have good range in the left shoulder and I wonder if this is dislocated anteriorly. He said this happens recently when he was being transported [facility] by ambulance Neurologic; he has antigravity strength in the hip flexors but just barely 3+ out of 5 at Dr. Henrene Hawking his strength seems more intact. Reflexes are normal in the upper extremities and absent at the knee and ankle jerks both toes are downgoing. Sensory perhaps mild loss of sensation in both lower legs although this is  not dramatic. I did not attempt to ambulate him. Mental status; I see no obvious abnormalities here. Skin no pressure areas are seen. He has what appear to be calcifications over his tibia especially on the right to a lesser extent on the left the. He has nondescript macules and papules over his back although he apparently has had one of these biopsied in the past.  Impression/plan #1 pyelonephritis complicated by urinary obstruction from nephrolithiasis. Culture grew Proteus which was essentially pansensitive. He is to continue on Keflex until followed by urology in 2 weeks #2 apparently nonambulatory and essentially bedbound secondary to idiopathic peripheral neuropathy although I have no details on this. He states his legs simply cannot support him. #3 history of gout with tophi although there are no active joints. I wonder about the nature of his stones. #4 limited range in the left shoulder question dislocation. #5 gynecomastia I feel no masses #6 appears to of had B12 deficiency in the past with macrocytic anemia this might be worth rechecking #7 history of congestive heart failure cor pulmonale likely  secondary to obesity hypoventilation syndrome. He is on a high-dose of Doses diuretics his electrolytes certainly will need to be rechecked   I think we have some medical issues to look into however most of my concerns about this manner his bedbound status living alone. It is difficult to see how this can be easily managed all the doesn't seem to share any of my concerns.

## 2015-01-24 ENCOUNTER — Ambulatory Visit (HOSPITAL_COMMUNITY): Payer: Medicare Other | Attending: Internal Medicine

## 2015-01-24 ENCOUNTER — Other Ambulatory Visit: Payer: Self-pay | Admitting: *Deleted

## 2015-01-24 DIAGNOSIS — M25512 Pain in left shoulder: Secondary | ICD-10-CM | POA: Insufficient documentation

## 2015-01-24 MED ORDER — OXYCODONE HCL 5 MG PO TABS: 5.0000 mg | ORAL_TABLET | Freq: Four times a day (QID) | ORAL | Status: DC | PRN

## 2015-01-24 MED ORDER — OXYCODONE-ACETAMINOPHEN 5-325 MG PO TABS: 1.0000 | ORAL_TABLET | Freq: Four times a day (QID) | ORAL | Status: DC | PRN

## 2015-01-24 NOTE — Telephone Encounter (Signed)
[facility] 

## 2015-01-25 ENCOUNTER — Encounter (HOSPITAL_COMMUNITY)
Admission: AD | Admit: 2015-01-25 | Discharge: 2015-01-25 | Disposition: A | Discharge status: Home / Self Care | Payer: Medicare Other | Source: Skilled Nursing Facility | Attending: Internal Medicine | Admitting: Internal Medicine

## 2015-01-25 DIAGNOSIS — I10 Essential (primary) hypertension: Secondary | ICD-10-CM | POA: Insufficient documentation

## 2015-01-25 LAB — CK: Total CK: 15 U/L — ABNORMAL LOW (ref 49–397)

## 2015-01-25 LAB — BASIC METABOLIC PANEL
ANION GAP: 8 (ref 5–15)
BUN: 26 mg/dL — ABNORMAL HIGH (ref 6–20)
CHLORIDE: 96 mmol/L — AB (ref 101–111)
CO2: 32 mmol/L (ref 22–32)
Calcium: 10.5 mg/dL — ABNORMAL HIGH (ref 8.9–10.3)
Creatinine, Ser: 0.93 mg/dL (ref 0.61–1.24)
GFR calc Af Amer: >60 mL/min (ref >60)
GLUCOSE: 101 mg/dL — AB (ref 65–99)
POTASSIUM: 3.2 mmol/L — AB (ref 3.5–5.1)
Sodium: 136 mmol/L (ref 135–145)

## 2015-01-25 LAB — URIC ACID: Uric Acid, Serum: 8.1 mg/dL — ABNORMAL HIGH (ref 4.4–7.6)

## 2015-01-25 LAB — VITAMIN B12: VITAMIN B 12: 428 pg/mL (ref 180–914)

## 2015-01-30 ENCOUNTER — Encounter (HOSPITAL_COMMUNITY)
Admission: RE | Admit: 2015-01-30 | Discharge: 2015-01-30 | Disposition: A | Discharge status: Home / Self Care | Payer: Medicare Other | Source: Skilled Nursing Facility | Attending: Internal Medicine | Admitting: Internal Medicine

## 2015-01-30 LAB — VITAMIN B12: VITAMIN B 12: 282 pg/mL (ref 180–914)

## 2015-02-08 ENCOUNTER — Other Ambulatory Visit: Payer: Self-pay | Admitting: Urology

## 2015-02-08 ENCOUNTER — Ambulatory Visit: Payer: Medicare Other | Admitting: Urology

## 2015-02-15 ENCOUNTER — Non-Acute Institutional Stay (SKILLED_NURSING_FACILITY): Payer: Medicare Other | Admitting: Internal Medicine

## 2015-02-15 ENCOUNTER — Encounter: Payer: Self-pay | Admitting: Internal Medicine

## 2015-02-15 DIAGNOSIS — I482 Chronic atrial fibrillation, unspecified: Secondary | ICD-10-CM

## 2015-02-15 DIAGNOSIS — I5031 Acute diastolic (congestive) heart failure: Secondary | ICD-10-CM

## 2015-02-15 DIAGNOSIS — R531 Weakness: Secondary | ICD-10-CM

## 2015-02-15 DIAGNOSIS — N390 Urinary tract infection, site not specified: Secondary | ICD-10-CM

## 2015-02-15 NOTE — Progress Notes (Signed)
Patient ID: Frederick Brown, male   DOB: 12/21/1950, 64 y.o.   MRN: [id number]     This is a discharge note Facility; [facility]    Chief complaint; discharge note  HPI---; this patient was admitted with fever nausea vomiting. He was diagnosed with the Proteus pyelonephritis treated with antibiotics and put on Keflex. He apparently has nephrolithiasis and required bilateral stent placement on 10/19. CT renals showed bilateral nonobstructive nephrolithiasis mild right hydroureter and right hydronephrosis. There were 2 adjacent obstructive calculi in the mid right ureter at L5-S1. He was also noted to have stable bilateral adrenal adenomas and stable chronic pneumobilia postcholecystectomy cystectomy.   The patient lives in [place] in his own home. He is apparently essentially bedbound although he lives alone. He has neighbors who come in and help him as well as home health aides---Apparently he had extensive lower abdominal surgery to remove subcutaneous fat several months ago at [facility]. He states that he is not able to walk since then. Prior to the surgery he was able to get up walk with a walker. Able to drive himself around he has not been able to do that since. He is followed up with a Dr. Jacinto Halim who is neurologist at [facility] in. He thinks he has some form of neuropathy although he is not a diabetic as mentioned he is essentially bedbound at home  He has been stable during his stay here-he is followed closely by urology he continues on Keflex indefinitely --per urology and they will follow up on this      Patient will be going home --as noted he has an extensive support system would certainly will be needed-he has gained strength during his stay here although again he has significant weakness   Past Medical History  Diagnosis Date  . Hypertension   . Obesity, morbid (more than 100 lbs over ideal weight or BMI > 40) (HCC)   . Degenerative joint disease   . Alcohol abuse   . Umbilical  hernia   . Gastroesophageal reflux disease   . Vitamin B12 deficiency 05/22/2011  . Fracture of metatarsal of left foot, closed 05/28/2011  . Fracture of great toe, left, closed 05/28/2011  . Chronic respiratory failure with hypoxia (HCC) 05/28/2011  . Urticarial rash 05/31/2011    From Cipro  . Cor pulmonale, chronic [facility]) February 2013  . Obesity hypoventilation syndrome [facility]) February 2013  . Pickwickian syndrome (HCC)   . CHF (congestive heart failure) (HCC)   . A-fib [facility])    Past Surgical History  Procedure Laterality Date  . Cholecystectomy    . Knee surgery    . Cystoscopy    . Cystoscopy with retrograde pyelogram, ureteroscopy and stent placement Bilateral 01/18/2015    Procedure: CYSTOSCOPY WITH BILATERAL RETROGRADE PYELOGRAM, AND BILATERAL STENT PLACEMENT;  Surgeon: Malen Gauze, MD;  Location: [facility];  Service: Urology;  Laterality: Bilateral;    Current Outpatient Prescriptions on File Prior to Visit  Medication Sig Dispense Refill  . acetaminophen (TYLENOL) 500 MG tablet Take 1,000 mg by mouth every 6 (six) hours as needed for mild pain.    Marland Kitchen allopurinol (ZYLOPRIM) 300 MG tablet Take 300 mg by mouth daily.    Marland Kitchen aspirin EC 81 MG tablet Take 81 mg by mouth daily.    . cephALEXin (KEFLEX) 500 MG capsule Take 1 capsule (500 mg total) by mouth 3 (three) times daily. Until seen by Dr. Annabell Howells    . COLCRYS 0.6 MG tablet Take 1  tablet by mouth every 12 (twelve) hours as needed (for gout).   10  . furosemide (LASIX) 40 MG tablet Take 40 mg by mouth daily.    . metolazone (ZAROXOLYN) 2.5 MG tablet Take 2.5 mg by mouth 2 (two) times daily.     . metoprolol tartrate (LOPRESSOR) 25 MG tablet Take 25 mg by mouth 2 (two) times daily.    Marland Kitchen omeprazole (PRILOSEC) 20 MG capsule Take 20 mg by mouth every other day.     . oxyCODONE (OXY IR/ROXICODONE) 5 MG immediate release tablet Take 1 tablet (5 mg total) by mouth every 6 (six) hours as needed for moderate pain. 30 tablet 0  .  oxyCODONE-acetaminophen (PERCOCET/ROXICET) 5-325 MG tablet Take 1 tablet by mouth every 6 (six) hours as needed for moderate pain. 20 tablet 0  . potassium chloride (MICRO-K) 10 MEQ CR capsule Take 10 mEq by mouth daily.    . simvastatin (ZOCOR) 10 MG tablet Take 10 mg by mouth daily.    . tamsulosin (FLOMAX) 0.4 MG CAPS capsule Take 1 capsule (0.4 mg total) by mouth daily. 30 capsule 2  . traZODone (DESYREL) 50 MG tablet Take 50 mg by mouth at bedtime.     Socially; lives in his own home and [place]. As mentioned according to him is essentially bedbound. He brought himself his own lift but I don't know that there is anybody there to help him use it. He has a home health aide and neighbors who come and help him prepare food etc.  reports that he has never smoked. He does not have any smokeless tobacco history on file. He reports that he drinks about 0.6 oz of alcohol per week. He reports that he does not use illicit drugs.  family history includes Heart attack in his father; Heart failure in his father; Hypertension in his father; Stroke in his father.  Review of systems Gen. patient is not in any distress. Does not complain of fever or chills.  Skin does not complain of rashes or itching HEENT no headache or visual concerns Respiratory no shortness of breath Cardiac no chest pain he is on a large amount of diuretic including Lasix 40 and Zaroxolyn GI no abdominal pain nausea vomiting --is complaining somewhat of constipation GU no  overt dysuria-- Musculoskeletal as a history of gout but nothing active. No significant back pain Neurologic; patient states he has weakness and numbness and tingling in his lower legs. This is a been of recent onset and he is following with neurology at [facility]. It makes it impossible for him to stand Mental status; does not complain of depression or anxiety Endocrine; he is not a diabetic appears to have gynecomastia predominantly on the left GU; does not  really relate previous voiding difficulties  Physical examination Temperature 97.6 pulse 70 respirations 20 blood pressure 100/75 Gen. morbidly obese man not in any distress Skin is warm and dry HEENT oral exam is normal -oropharynx clear mucous membranes moist  Respiratory clear entry bilaterally Cardiac heart sounds are normal no murmurs. No evidence of heart failure -- quite mild lower extremity edema pedal pulses are intact Abdomen; morbidly obese diffusely tender no liver no spleen no masses positive bowel sounds GU no suprapubic or costovertebral angle tenderness currently. 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 He does not have good range in the left shoulde-r-but this does not appear to be new r   Neurologic He is able to move all extremities 4 he does have  antigravity strength in his lower extremities although again he has significant weakness as noted above-- is nonambulatory Mental status; I see no obvious abnormalities here. Labs.  01/30/2015.  B12 level CCLXXXII.  01/25/2015.  Sodium 136 potassium 3.2 BUN 26 creatinine 0.93.  CPK level XV.  01/21/2015.  WBC 4.1 hemoglobin 11.0 platelets 203.  01/19/2015.  TSH-0.200.  Free T4-1 0.58.  T3 1 0.8.     Impression/plan #1 pyelonephritis complicated by urinary obstruction from nephrolithiasis. Culture grew Proteus which was essentially pansensitive. He is to continue on Keflex as determined by urology has urology follow-up #2 apparently nonambulatory and essentially bedbound secondary to idiopathic peripheral neuropathy although I have no details on this. He states his legs simply cannot support him--has an extensive support system at home which he will need- Apparently he will be alone at times but he is confident says this has been the way he's been living for some time-this is somewhat of a concern however patient assures me he is used to this and has done well with this arrangement . #3  history of gout with tophi although there are no active joints.   #4 appears to of had B12 deficiency in the past with macrocytic anemia--B12 level was within normal range however at 282 #5 history of congestive heart failure cor pulmonale likely secondary to obesity hypoventilation syndrome. He is on a high-dose of Doses diuretics his electrolytes certainly will need to be rechecked--he did have a low potassium of 3.2 on October 26 this is being supplemented with 20 mEq twice a day  #6-constipation-he is on MiraLAX daily will check an abdominal x-ray to rule out anything more serious also will update lab work including a CMP and CBC  #7-history of atrial fibrillation he continues on Lopressor this appears to be rate controlled-continues on aspirin for anticoagulation  [id number]-of note greater than 30 minutes spent on this discharge summary-greater than 50% of time spent coordinating plan of care for numerous diagnoses

## 2015-02-16 ENCOUNTER — Encounter (HOSPITAL_COMMUNITY)
Admission: AD | Admit: 2015-02-16 | Discharge: 2015-02-16 | Disposition: A | Discharge status: Home / Self Care | Payer: Medicare Other | Source: Skilled Nursing Facility | Attending: Internal Medicine | Admitting: Internal Medicine

## 2015-02-16 ENCOUNTER — Ambulatory Visit (HOSPITAL_COMMUNITY): Payer: Medicare Other | Attending: Internal Medicine

## 2015-02-16 ENCOUNTER — Encounter (HOSPITAL_COMMUNITY): Payer: Self-pay

## 2015-02-16 DIAGNOSIS — K5900 Constipation, unspecified: Secondary | ICD-10-CM | POA: Insufficient documentation

## 2015-02-16 DIAGNOSIS — R109 Unspecified abdominal pain: Secondary | ICD-10-CM | POA: Diagnosis not present

## 2015-02-16 DIAGNOSIS — E039 Hypothyroidism, unspecified: Secondary | ICD-10-CM | POA: Insufficient documentation

## 2015-02-16 DIAGNOSIS — I10 Essential (primary) hypertension: Secondary | ICD-10-CM | POA: Insufficient documentation

## 2015-02-16 LAB — COMPREHENSIVE METABOLIC PANEL
ALBUMIN: 3.7 g/dL (ref 3.5–5.0)
ALK PHOS: 83 U/L (ref 38–126)
ALT: 14 U/L — AB (ref 17–63)
ANION GAP: 9 (ref 5–15)
AST: 16 U/L (ref 15–41)
BILIRUBIN TOTAL: 0.7 mg/dL (ref 0.3–1.2)
BUN: 41 mg/dL — AB (ref 6–20)
CALCIUM: 10.6 mg/dL — AB (ref 8.9–10.3)
CO2: 31 mmol/L (ref 22–32)
CREATININE: 0.93 mg/dL (ref 0.61–1.24)
Chloride: 97 mmol/L — ABNORMAL LOW (ref 101–111)
GFR calc Af Amer: >60 mL/min (ref >60)
GFR calc non Af Amer: >60 mL/min (ref >60)
GLUCOSE: 97 mg/dL (ref 65–99)
Potassium: 3.2 mmol/L — ABNORMAL LOW (ref 3.5–5.1)
Sodium: 137 mmol/L (ref 135–145)
TOTAL PROTEIN: 6.6 g/dL (ref 6.5–8.1)

## 2015-02-16 LAB — CBC WITH DIFFERENTIAL/PLATELET
BASOS ABS: 0 10*3/uL (ref 0.0–0.1)
BASOS PCT: 0 %
EOS ABS: 0.2 10*3/uL (ref 0.0–0.7)
EOS PCT: 4 %
HCT: 40.6 % (ref 39.0–52.0)
Hemoglobin: 13.2 g/dL (ref 13.0–17.0)
Lymphocytes Relative: 29 %
Lymphs Abs: 1.1 10*3/uL (ref 0.7–4.0)
MCH: 29 pg (ref 26.0–34.0)
MCHC: 32.5 g/dL (ref 30.0–36.0)
MCV: 89.2 fL (ref 78.0–100.0)
MONO ABS: 0.4 10*3/uL (ref 0.1–1.0)
MONOS PCT: 9 %
Neutro Abs: 2.3 10*3/uL (ref 1.7–7.7)
Neutrophils Relative %: 58 %
PLATELETS: 116 10*3/uL — AB (ref 150–400)
RBC: 4.55 MIL/uL (ref 4.22–5.81)
RDW: 15.8 % — AB (ref 11.5–15.5)
WBC: 3.9 10*3/uL — ABNORMAL LOW (ref 4.0–10.5)

## 2015-02-16 LAB — TSH: TSH: 0.477 u[IU]/mL (ref 0.350–4.500)

## 2015-02-16 NOTE — Patient Instructions (Signed)
    Frederick Brown  02/16/2015     @PREFPERIOPPHARMACY @   Your procedure is scheduled on  02/22/2015  Report to [name] at  1030  A.M.  Call this number if you have problems the morning of surgery:  [phone]   Remember:  Do not eat food or drink liquids after midnight.  Take these medicines the morning of surgery with A SIP OF WATER  Allopurinol, colcys, metoprolol, prilosec, oxycodone, flomax.   Do not wear jewelry, make-up or nail polish.  Do not wear lotions, powders, or perfumes.  You may wear deodorant.  Do not shave 48 hours prior to surgery.  Men may shave face and neck.  Do not bring valuables to the hospital.  [facility] is not responsible for any belongings or valuables.  Contacts, dentures or bridgework may not be worn into surgery.  Leave your suitcase in the car.  After surgery it may be brought to your room.  For patients admitted to the hospital, discharge time will be determined by your treatment team.  Patients discharged the day of surgery will not be allowed to drive home.   Name and phone number of your driver:   [name] Special instructions:  Please bring a copy of the morning meds he took on the day of surgery and the time they were taken. If patient is unable to sign consent, make sure his POA or representative is here to sign the consent the morning of surgery.  Please read over the following fact sheets that you were given. Surgical Site Infection Prevention

## 2015-02-17 ENCOUNTER — Encounter (HOSPITAL_COMMUNITY)
Admit: 2015-02-17 | Discharge: 2015-02-17 | Disposition: A | Discharge status: Home / Self Care | Payer: Medicare Other | Attending: Urology | Admitting: Urology

## 2015-02-20 ENCOUNTER — Other Ambulatory Visit (HOSPITAL_COMMUNITY)
Admission: RE | Admit: 2015-02-20 | Discharge: 2015-02-20 | Disposition: A | Discharge status: Home / Self Care | Payer: Medicare Other | Attending: Family Medicine | Admitting: Family Medicine

## 2015-02-20 LAB — CBC WITH DIFFERENTIAL/PLATELET
Basophils Absolute: 0 10*3/uL (ref 0.0–0.1)
Basophils Relative: 0 %
Eosinophils Absolute: 0 10*3/uL (ref 0.0–0.7)
Eosinophils Relative: 0 %
HCT: 43.1 % (ref 39.0–52.0)
HEMOGLOBIN: 13.8 g/dL (ref 13.0–17.0)
LYMPHS ABS: 0.6 10*3/uL — AB (ref 0.7–4.0)
LYMPHS PCT: 6 %
MCH: 28.9 pg (ref 26.0–34.0)
MCHC: 32 g/dL (ref 30.0–36.0)
MCV: 90.4 fL (ref 78.0–100.0)
MONO ABS: 0.6 10*3/uL (ref 0.1–1.0)
MONOS PCT: 5 %
NEUTROS ABS: 9.5 10*3/uL — AB (ref 1.7–7.7)
Neutrophils Relative %: 89 %
PLATELETS: 130 10*3/uL — AB (ref 150–400)
RBC: 4.77 MIL/uL (ref 4.22–5.81)
RDW: 16.2 % — ABNORMAL HIGH (ref 11.5–15.5)
WBC: 10.7 10*3/uL — ABNORMAL HIGH (ref 4.0–10.5)

## 2015-02-20 LAB — BASIC METABOLIC PANEL
ANION GAP: 11 (ref 5–15)
BUN: 24 mg/dL — ABNORMAL HIGH (ref 6–20)
CHLORIDE: 98 mmol/L — AB (ref 101–111)
CO2: 30 mmol/L (ref 22–32)
Calcium: 10.9 mg/dL — ABNORMAL HIGH (ref 8.9–10.3)
Creatinine, Ser: 0.91 mg/dL (ref 0.61–1.24)
GFR calc Af Amer: >60 mL/min (ref >60)
GFR calc non Af Amer: >60 mL/min (ref >60)
GLUCOSE: 109 mg/dL — AB (ref 65–99)
POTASSIUM: 3.1 mmol/L — AB (ref 3.5–5.1)
SODIUM: 139 mmol/L (ref 135–145)

## 2015-02-20 LAB — VITAMIN B12: Vitamin B-12: 366 pg/mL (ref 180–914)

## 2015-02-22 ENCOUNTER — Inpatient Hospital Stay (HOSPITAL_COMMUNITY)
Admission: RE | Admit: 2015-02-22 | Discharge: 2015-02-25 | DRG: 988 | Disposition: A | Discharge status: Home Health | Payer: Medicare Other | Source: Ambulatory Visit | Attending: Internal Medicine | Admitting: Internal Medicine

## 2015-02-22 ENCOUNTER — Ambulatory Visit (HOSPITAL_COMMUNITY): Payer: Medicare Other

## 2015-02-22 ENCOUNTER — Ambulatory Visit (HOSPITAL_COMMUNITY): Payer: Medicare Other | Admitting: Anesthesiology

## 2015-02-22 ENCOUNTER — Encounter (HOSPITAL_COMMUNITY): Payer: Self-pay | Admitting: *Deleted

## 2015-02-22 ENCOUNTER — Encounter (HOSPITAL_COMMUNITY)
Admission: RE | Disposition: A | Discharge status: Home Health | Payer: Self-pay | Source: Ambulatory Visit | Attending: Internal Medicine

## 2015-02-22 DIAGNOSIS — K219 Gastro-esophageal reflux disease without esophagitis: Secondary | ICD-10-CM | POA: Diagnosis present

## 2015-02-22 DIAGNOSIS — Z881 Allergy status to other antibiotic agents status: Secondary | ICD-10-CM

## 2015-02-22 DIAGNOSIS — N201 Calculus of ureter: Secondary | ICD-10-CM | POA: Diagnosis not present

## 2015-02-22 DIAGNOSIS — Z823 Family history of stroke: Secondary | ICD-10-CM

## 2015-02-22 DIAGNOSIS — E876 Hypokalemia: Secondary | ICD-10-CM | POA: Diagnosis present

## 2015-02-22 DIAGNOSIS — I482 Chronic atrial fibrillation: Secondary | ICD-10-CM | POA: Diagnosis not present

## 2015-02-22 DIAGNOSIS — Z6841 Body Mass Index (BMI) 40.0 and over, adult: Secondary | ICD-10-CM

## 2015-02-22 DIAGNOSIS — N390 Urinary tract infection, site not specified: Secondary | ICD-10-CM | POA: Diagnosis present

## 2015-02-22 DIAGNOSIS — Z8249 Family history of ischemic heart disease and other diseases of the circulatory system: Secondary | ICD-10-CM

## 2015-02-22 DIAGNOSIS — N179 Acute kidney failure, unspecified: Secondary | ICD-10-CM | POA: Diagnosis not present

## 2015-02-22 DIAGNOSIS — I10 Essential (primary) hypertension: Secondary | ICD-10-CM | POA: Diagnosis present

## 2015-02-22 DIAGNOSIS — N202 Calculus of kidney with calculus of ureter: Secondary | ICD-10-CM | POA: Diagnosis present

## 2015-02-22 DIAGNOSIS — N200 Calculus of kidney: Secondary | ICD-10-CM

## 2015-02-22 DIAGNOSIS — Z9119 Patient's noncompliance with other medical treatment and regimen: Secondary | ICD-10-CM

## 2015-02-22 DIAGNOSIS — F1010 Alcohol abuse, uncomplicated: Secondary | ICD-10-CM | POA: Diagnosis present

## 2015-02-22 DIAGNOSIS — I4891 Unspecified atrial fibrillation: Principal | ICD-10-CM | POA: Diagnosis present

## 2015-02-22 DIAGNOSIS — Z87442 Personal history of urinary calculi: Secondary | ICD-10-CM

## 2015-02-22 DIAGNOSIS — M1990 Unspecified osteoarthritis, unspecified site: Secondary | ICD-10-CM | POA: Diagnosis present

## 2015-02-22 HISTORY — PX: CYSTOSCOPY/RETROGRADE/URETEROSCOPY/STONE EXTRACTION WITH BASKET: SHX5317

## 2015-02-22 HISTORY — PX: CYSTOSCOPY W/ URETERAL STENT PLACEMENT: SHX1429

## 2015-02-22 HISTORY — PX: CYSTOSCOPY WITH HOLMIUM LASER LITHOTRIPSY: SHX6639

## 2015-02-22 LAB — CBC
HCT: 39.6 % (ref 39.0–52.0)
Hemoglobin: 13.1 g/dL (ref 13.0–17.0)
MCH: 29.4 pg (ref 26.0–34.0)
MCHC: 33.1 g/dL (ref 30.0–36.0)
MCV: 89 fL (ref 78.0–100.0)
PLATELETS: 124 10*3/uL — AB (ref 150–400)
RBC: 4.45 MIL/uL (ref 4.22–5.81)
RDW: 16.2 % — AB (ref 11.5–15.5)
WBC: 9.9 10*3/uL (ref 4.0–10.5)

## 2015-02-22 LAB — BASIC METABOLIC PANEL
Anion gap: 10 (ref 5–15)
BUN: 28 mg/dL — AB (ref 6–20)
CO2: 30 mmol/L (ref 22–32)
CREATININE: 1.09 mg/dL (ref 0.61–1.24)
Calcium: 10.2 mg/dL (ref 8.9–10.3)
Chloride: 96 mmol/L — ABNORMAL LOW (ref 101–111)
GFR calc Af Amer: >60 mL/min (ref >60)
GLUCOSE: 121 mg/dL — AB (ref 65–99)
Potassium: 3.2 mmol/L — ABNORMAL LOW (ref 3.5–5.1)
SODIUM: 136 mmol/L (ref 135–145)

## 2015-02-22 LAB — TSH: TSH: 0.579 u[IU]/mL (ref 0.350–4.500)

## 2015-02-22 LAB — TROPONIN I
Troponin I: <0.03 ng/mL (ref <0.031)
Troponin I: <0.03 ng/mL (ref <0.031)

## 2015-02-22 SURGERY — CYSTOSCOPY, WITH CALCULUS REMOVAL USING BASKET
Anesthesia: General | Laterality: Bilateral

## 2015-02-22 MED ORDER — SODIUM CHLORIDE 0.9 % IJ SOLN
INTRAMUSCULAR | Status: AC
  Filled 2015-02-22: qty 10

## 2015-02-22 MED ORDER — FENTANYL CITRATE (PF) 100 MCG/2ML IJ SOLN: 25.0000 ug | INTRAMUSCULAR | Status: DC | PRN

## 2015-02-22 MED ORDER — ROCURONIUM BROMIDE 50 MG/5ML IV SOLN
INTRAVENOUS | Status: AC
  Filled 2015-02-22: qty 1

## 2015-02-22 MED ORDER — SEVOFLURANE IN SOLN
RESPIRATORY_TRACT | Status: AC
  Filled 2015-02-22: qty 250

## 2015-02-22 MED ORDER — DIPHENHYDRAMINE HCL 50 MG/ML IJ SOLN: 12.5000 mg | Freq: Four times a day (QID) | INTRAMUSCULAR | Status: DC | PRN

## 2015-02-22 MED ORDER — GLYCOPYRROLATE 0.2 MG/ML IJ SOLN
INTRAMUSCULAR | Status: AC
  Filled 2015-02-22: qty 5

## 2015-02-22 MED ORDER — METOPROLOL TARTRATE 1 MG/ML IV SOLN
INTRAVENOUS | Status: AC
  Filled 2015-02-22: qty 5

## 2015-02-22 MED ORDER — SUCCINYLCHOLINE CHLORIDE 20 MG/ML IJ SOLN
INTRAMUSCULAR | Status: DC | PRN
  Administered 2015-02-22: 160 mg via INTRAVENOUS

## 2015-02-22 MED ORDER — ETOMIDATE 2 MG/ML IV SOLN
INTRAVENOUS | Status: AC
  Filled 2015-02-22: qty 10

## 2015-02-22 MED ORDER — MORPHINE SULFATE (PF) 2 MG/ML IV SOLN: 2.0000 mg | INTRAVENOUS | Status: DC | PRN

## 2015-02-22 MED ORDER — MAGNESIUM CITRATE PO SOLN: 1.0000 | Freq: Once | ORAL | Status: DC | PRN

## 2015-02-22 MED ORDER — ETOMIDATE 2 MG/ML IV SOLN
INTRAVENOUS | Status: DC | PRN
  Administered 2015-02-22: 18 mg via INTRAVENOUS

## 2015-02-22 MED ORDER — FENTANYL CITRATE (PF) 100 MCG/2ML IJ SOLN
INTRAMUSCULAR | Status: DC | PRN
  Administered 2015-02-22 (×4): 25 ug via INTRAVENOUS
  Administered 2015-02-22: 50 ug via INTRAVENOUS
  Administered 2015-02-22 (×2): 25 ug via INTRAVENOUS

## 2015-02-22 MED ORDER — VITAMIN B-1 100 MG PO TABS
100.0000 mg | ORAL_TABLET | Freq: Every day | ORAL | Status: DC
Start: 2015-02-22 — End: 2015-02-25
  Administered 2015-02-22 – 2015-02-25 (×4): 100 mg via ORAL
  Filled 2015-02-22 (×4): qty 1

## 2015-02-22 MED ORDER — FOLIC ACID 1 MG PO TABS
1.0000 mg | ORAL_TABLET | Freq: Every day | ORAL | Status: DC
  Administered 2015-02-22 – 2015-02-25 (×4): 1 mg via ORAL
  Filled 2015-02-22 (×4): qty 1

## 2015-02-22 MED ORDER — ROCURONIUM BROMIDE 100 MG/10ML IV SOLN
INTRAVENOUS | Status: DC | PRN
  Administered 2015-02-22: 25 mg via INTRAVENOUS
  Administered 2015-02-22: 5 mg via INTRAVENOUS

## 2015-02-22 MED ORDER — NEOSTIGMINE METHYLSULFATE 10 MG/10ML IV SOLN
INTRAVENOUS | Status: DC | PRN
  Administered 2015-02-22 (×3): .5 mg via INTRAVENOUS

## 2015-02-22 MED ORDER — ZOLPIDEM TARTRATE 5 MG PO TABS
5.0000 mg | ORAL_TABLET | Freq: Every evening | ORAL | Status: DC | PRN
  Administered 2015-02-22 – 2015-02-24 (×3): 5 mg via ORAL
  Filled 2015-02-22 (×4): qty 1

## 2015-02-22 MED ORDER — GLYCOPYRROLATE 0.2 MG/ML IJ SOLN
0.2000 mg | Freq: Once | INTRAMUSCULAR | Status: AC
  Administered 2015-02-22: 0.2 mg via INTRAVENOUS
  Filled 2015-02-22: qty 1

## 2015-02-22 MED ORDER — SODIUM CHLORIDE 0.9 % IR SOLN
Status: DC | PRN
  Administered 2015-02-22 (×2): 3000 mL

## 2015-02-22 MED ORDER — ADULT MULTIVITAMIN W/MINERALS CH
1.0000 | ORAL_TABLET | Freq: Every day | ORAL | Status: DC
  Administered 2015-02-22 – 2015-02-25 (×4): 1 via ORAL
  Filled 2015-02-22 (×4): qty 1

## 2015-02-22 MED ORDER — FENTANYL CITRATE (PF) 100 MCG/2ML IJ SOLN
INTRAMUSCULAR | Status: AC
  Filled 2015-02-22: qty 2

## 2015-02-22 MED ORDER — ONDANSETRON HCL 4 MG/2ML IJ SOLN: 4.0000 mg | INTRAMUSCULAR | Status: DC | PRN

## 2015-02-22 MED ORDER — POTASSIUM CHLORIDE CRYS ER 20 MEQ PO TBCR
40.0000 meq | EXTENDED_RELEASE_TABLET | Freq: Every day | ORAL | Status: DC
  Administered 2015-02-22 – 2015-02-25 (×4): 40 meq via ORAL
  Filled 2015-02-22 (×4): qty 2

## 2015-02-22 MED ORDER — HEPARIN SODIUM (PORCINE) 5000 UNIT/ML IJ SOLN
5000.0000 [IU] | Freq: Three times a day (TID) | INTRAMUSCULAR | Status: DC
  Administered 2015-02-22 – 2015-02-25 (×10): 5000 [IU] via SUBCUTANEOUS
  Filled 2015-02-22 (×10): qty 1

## 2015-02-22 MED ORDER — LORAZEPAM 2 MG/ML IJ SOLN: 1.0000 mg | Freq: Four times a day (QID) | INTRAMUSCULAR | Status: DC | PRN

## 2015-02-22 MED ORDER — GLYCOPYRROLATE 0.2 MG/ML IJ SOLN
INTRAMUSCULAR | Status: DC | PRN
Start: 2015-02-22 — End: 2015-02-22
  Administered 2015-02-22: 0.1 mg via INTRAVENOUS

## 2015-02-22 MED ORDER — ARTIFICIAL TEARS OP OINT
TOPICAL_OINTMENT | OPHTHALMIC | Status: AC
  Filled 2015-02-22: qty 3.5

## 2015-02-22 MED ORDER — LACTATED RINGERS IV SOLN
INTRAVENOUS | Status: DC
  Administered 2015-02-22: 1000 mL via INTRAVENOUS

## 2015-02-22 MED ORDER — SODIUM CHLORIDE 0.9 % IJ SOLN
3.0000 mL | Freq: Two times a day (BID) | INTRAMUSCULAR | Status: DC
  Administered 2015-02-22 – 2015-02-25 (×3): 3 mL via INTRAVENOUS

## 2015-02-22 MED ORDER — TAMSULOSIN HCL 0.4 MG PO CAPS
0.4000 mg | ORAL_CAPSULE | Freq: Every day | ORAL | Status: DC
  Administered 2015-02-22 – 2015-02-25 (×4): 0.4 mg via ORAL
  Filled 2015-02-22 (×4): qty 1

## 2015-02-22 MED ORDER — THIAMINE HCL 100 MG/ML IJ SOLN
100.0000 mg | Freq: Every day | INTRAMUSCULAR | Status: DC
  Filled 2015-02-22: qty 2

## 2015-02-22 MED ORDER — SORBITOL 70 % SOLN: 30.0000 mL | Freq: Every day | Status: DC | PRN

## 2015-02-22 MED ORDER — ONDANSETRON HCL 4 MG PO TABS: 4.0000 mg | ORAL_TABLET | Freq: Four times a day (QID) | ORAL | Status: DC | PRN

## 2015-02-22 MED ORDER — DEXTROSE 5 % IV SOLN
2.0000 g | INTRAVENOUS | Status: DC
  Administered 2015-02-22 – 2015-02-24 (×3): 2 g via INTRAVENOUS
  Filled 2015-02-22 (×5): qty 2

## 2015-02-22 MED ORDER — IOHEXOL 350 MG/ML SOLN
INTRAVENOUS | Status: DC | PRN
  Administered 2015-02-22: 50 mL

## 2015-02-22 MED ORDER — METOPROLOL TARTRATE 25 MG PO TABS
25.0000 mg | ORAL_TABLET | Freq: Two times a day (BID) | ORAL | Status: DC
  Filled 2015-02-22: qty 1

## 2015-02-22 MED ORDER — ONDANSETRON HCL 4 MG/2ML IJ SOLN
4.0000 mg | Freq: Once | INTRAMUSCULAR | Status: AC
  Administered 2015-02-22: 4 mg via INTRAVENOUS
  Filled 2015-02-22: qty 2

## 2015-02-22 MED ORDER — SODIUM CHLORIDE 0.9 % IV SOLN
INTRAVENOUS | Status: DC
Start: 2015-02-22 — End: 2015-02-25
  Administered 2015-02-22 – 2015-02-23 (×2): via INTRAVENOUS

## 2015-02-22 MED ORDER — EPHEDRINE SULFATE 50 MG/ML IJ SOLN
INTRAMUSCULAR | Status: AC
  Filled 2015-02-22: qty 1

## 2015-02-22 MED ORDER — ONDANSETRON HCL 4 MG/2ML IJ SOLN: 4.0000 mg | Freq: Once | INTRAMUSCULAR | Status: DC | PRN

## 2015-02-22 MED ORDER — OXYCODONE-ACETAMINOPHEN 5-325 MG PO TABS
1.0000 | ORAL_TABLET | ORAL | Status: DC | PRN
  Administered 2015-02-22: 2 via ORAL
  Administered 2015-02-23: 1 via ORAL
  Administered 2015-02-23: 2 via ORAL
  Administered 2015-02-24: 1 via ORAL
  Filled 2015-02-22: qty 1
  Filled 2015-02-22 (×2): qty 2
  Filled 2015-02-22: qty 1

## 2015-02-22 MED ORDER — TRAZODONE HCL 50 MG PO TABS
50.0000 mg | ORAL_TABLET | Freq: Every day | ORAL | Status: DC
  Administered 2015-02-22 – 2015-02-23 (×2): 50 mg via ORAL
  Filled 2015-02-22 (×2): qty 1

## 2015-02-22 MED ORDER — DIPHENHYDRAMINE HCL 12.5 MG/5ML PO ELIX: 12.5000 mg | ORAL_SOLUTION | Freq: Four times a day (QID) | ORAL | Status: DC | PRN

## 2015-02-22 MED ORDER — LIDOCAINE HCL (PF) 1 % IJ SOLN
INTRAMUSCULAR | Status: AC
  Filled 2015-02-22: qty 5

## 2015-02-22 MED ORDER — SUCCINYLCHOLINE CHLORIDE 20 MG/ML IJ SOLN
INTRAMUSCULAR | Status: AC
  Filled 2015-02-22: qty 1

## 2015-02-22 MED ORDER — LORAZEPAM 1 MG PO TABS: 1.0000 mg | ORAL_TABLET | Freq: Four times a day (QID) | ORAL | Status: DC | PRN

## 2015-02-22 MED ORDER — ALLOPURINOL 300 MG PO TABS
300.0000 mg | ORAL_TABLET | Freq: Every day | ORAL | Status: DC
  Administered 2015-02-22 – 2015-02-25 (×4): 300 mg via ORAL
  Filled 2015-02-22 (×4): qty 1

## 2015-02-22 MED ORDER — SIMVASTATIN 10 MG PO TABS
10.0000 mg | ORAL_TABLET | Freq: Every day | ORAL | Status: DC
  Administered 2015-02-22 – 2015-02-25 (×4): 10 mg via ORAL
  Filled 2015-02-22 (×4): qty 1

## 2015-02-22 MED ORDER — DEXTROSE 5 % IV SOLN
2.0000 g | INTRAVENOUS | Status: AC
  Administered 2015-02-22: 2 g via INTRAVENOUS
  Filled 2015-02-22: qty 2

## 2015-02-22 MED ORDER — ONDANSETRON HCL 4 MG/2ML IJ SOLN: 4.0000 mg | Freq: Four times a day (QID) | INTRAMUSCULAR | Status: DC | PRN

## 2015-02-22 MED ORDER — MIDAZOLAM HCL 2 MG/2ML IJ SOLN
1.0000 mg | INTRAMUSCULAR | Status: DC | PRN
Start: 2015-02-22 — End: 2015-02-22
  Administered 2015-02-22: 2 mg via INTRAVENOUS
  Filled 2015-02-22: qty 2

## 2015-02-22 MED ORDER — ASPIRIN EC 81 MG PO TBEC
81.0000 mg | DELAYED_RELEASE_TABLET | Freq: Every day | ORAL | Status: DC
  Administered 2015-02-22 – 2015-02-25 (×4): 81 mg via ORAL
  Filled 2015-02-22 (×4): qty 1

## 2015-02-22 MED ORDER — PROPOFOL 10 MG/ML IV BOLUS
INTRAVENOUS | Status: AC
  Filled 2015-02-22: qty 20

## 2015-02-22 MED ORDER — METOPROLOL TARTRATE 1 MG/ML IV SOLN
INTRAVENOUS | Status: DC | PRN
  Administered 2015-02-22: 1 mg via INTRAVENOUS
  Administered 2015-02-22: 2.5 mg via INTRAVENOUS
  Administered 2015-02-22: 1 mg via INTRAVENOUS
  Administered 2015-02-22: 2.5 mg via INTRAVENOUS
  Administered 2015-02-22: 1 mg via INTRAVENOUS

## 2015-02-22 MED ORDER — NEOSTIGMINE METHYLSULFATE 10 MG/10ML IV SOLN
INTRAVENOUS | Status: AC
  Filled 2015-02-22: qty 1

## 2015-02-22 MED ORDER — DOCUSATE SODIUM 100 MG PO CAPS
100.0000 mg | ORAL_CAPSULE | Freq: Two times a day (BID) | ORAL | Status: DC
  Administered 2015-02-22 – 2015-02-25 (×6): 100 mg via ORAL
  Filled 2015-02-22 (×6): qty 1

## 2015-02-22 SURGICAL SUPPLY — 20 items
BAG HAMPER (MISCELLANEOUS) ×2 IMPLANT
BAG URO CATCHER STRL LF (DRAPE) ×2 IMPLANT
CATH INTERMIT  6FR 70CM (CATHETERS) ×1 IMPLANT
CLOTH BEACON ORANGE TIMEOUT ST (SAFETY) ×2 IMPLANT
EXTRACTOR STONE NITINOL NGAGE (UROLOGICAL SUPPLIES) ×2 IMPLANT
FIBER LASER FLEXIVA 200 (UROLOGICAL SUPPLIES) ×1 IMPLANT
GLOVE BIO SURGEON STRL SZ8 (GLOVE) ×2 IMPLANT
GOWN STRL REUS W/ TWL LRG LVL3 (GOWN DISPOSABLE) ×1 IMPLANT
GOWN STRL REUS W/ TWL XL LVL3 (GOWN DISPOSABLE) ×1 IMPLANT
GOWN STRL REUS W/TWL LRG LVL3 (GOWN DISPOSABLE) ×2
GOWN STRL REUS W/TWL XL LVL3 (GOWN DISPOSABLE) ×2
GUIDEWIRE ANG ZIPWIRE 038X150 (WIRE) ×2 IMPLANT
GUIDEWIRE STR DUAL SENSOR (WIRE) ×1 IMPLANT
IV NS IRRIG 3000ML ARTHROMATIC (IV SOLUTION) ×3 IMPLANT
MANIFOLD NEPTUNE II (INSTRUMENTS) ×1 IMPLANT
PACK CYSTO (CUSTOM PROCEDURE TRAY) ×2 IMPLANT
PAD ARMBOARD 7.5X6 YLW CONV (MISCELLANEOUS) ×2 IMPLANT
SHEATH ACCESS URETERAL 38CM (SHEATH) ×1 IMPLANT
STENT URET 6FRX26 CONTOUR (STENTS) ×2 IMPLANT
SYRINGE 10CC LL (SYRINGE) ×2 IMPLANT

## 2015-02-22 NOTE — Anesthesia Postprocedure Evaluation (Signed)
Anesthesia Post Note  Patient: Clyde CanterburyJames H Soto  Procedure(s) Performed: Procedure(s) (LRB): CYSTOSCOPY/BILATERAL RETROGRADE/BILATERAL URETEROSCOPY/BILATERAL STONE EXTRACTION WITH BASKET (Bilateral) CYSTOSCOPY WITH HOLMIUM LASER BILATERAL LITHOTRIPSY (Bilateral) CYSTOSCOPY WITH BILATERAL STENT EXCHANGE (Bilateral)  Patient location during evaluation: PACU Anesthesia Type: General Level of consciousness: awake and alert Pain management: pain level controlled Vital Signs Assessment: post-procedure vital signs reviewed and stable Respiratory status: spontaneous breathing, nonlabored ventilation, respiratory function stable and patient connected to nasal cannula oxygen Cardiovascular status: blood pressure returned to baseline and stable Postop Assessment: No signs of nausea or vomiting Anesthetic complications: no    Last Vitals:  Filed Vitals:   02/22/15 1415 02/22/15 1430  BP: 133/73 106/70  Pulse: 94 94  Temp: 36.8 C   Resp: 23 19    Last Pain:  Filed Vitals:   02/22/15 1432  PainSc: 2                  Hrithik Boschee

## 2015-02-22 NOTE — Brief Op Note (Signed)
02/22/2015  2:06 PM  PATIENT:  Clyde CanterburyJames H Waner  64 y.o. male  PRE-OPERATIVE DIAGNOSIS:  BILATERAL RENAL CALCULI  POST-OPERATIVE DIAGNOSIS:  BILATERAL RENAL CALCULI  PROCEDURE:  Procedure(s): CYSTOSCOPY/BILATERAL RETROGRADE/BILATERAL URETEROSCOPY/BILATERAL STONE EXTRACTION WITH BASKET (Bilateral) CYSTOSCOPY WITH HOLMIUM LASER BILATERAL LITHOTRIPSY (Bilateral) CYSTOSCOPY WITH BILATERAL STENT EXCHANGE (Bilateral)  SURGEON:  Surgeon(s) and Role:    * Malen GauzePatrick L Billie Trager, MD - Primary  PHYSICIAN ASSISTANT:   ASSISTANTS: none   ANESTHESIA:   general  EBL:  Total I/O In: 800 [I.V.:800] Out: -   BLOOD ADMINISTERED:none  DRAINS: Bilateral 6x26 JJ stents  LOCAL MEDICATIONS USED:  NONE  SPECIMEN:  Source of Specimen:  stone  DISPOSITION OF SPECIMEN:  PATHOLOGY  COUNTS:  YES  TOURNIQUET:  * No tourniquets in log *  DICTATION: .Note written in EPIC  PLAN OF CARE: Admit for overnight observation  PATIENT DISPOSITION:  PACU - hemodynamically stable.   Delay start of Pharmacological VTE agent (>24hrs) due to surgical blood loss or risk of bleeding: not applicable

## 2015-02-22 NOTE — H&P (Signed)
[facility] History and Physical  Frederick CanterburyJames H Elgin RUE:[id number]:[id number] DOB: 03/06/1951    PCP:   Trinna PostKOBERLEIN, JUNELL CAROL, MD   Chief Complaint:  Request for admission from urologist Dr Ronne BinningMcKenzie for afib with RVR.   HPI: Frederick Brown is an 64 y.o. male with hx of neprholithiasis, admitted for sespsis due to UTI a month ago, s/p bilateral ureteral stent, hx of chroninc afib, not on anticoagulation (reviewed his record:  Dr Crosby Oysterothbard was his cardiologist, non compliance, so wasn't put on coumadin in 2013.  If compliance, to be considered), was to have stent exchanged and stone crushed today, but was not taken his betablocker, went into afib with RVR, so I was asked to admit him for further evaluation and Tx.  He is currently rate controlled, and offered no complaint in the recovery room.  He is alert and is in no apparent distress.  He has normal Hb and normal renal fx tests.  His K is 3.1 mEq/L.    Rewiew of Systems:  Constitutional: Negative for malaise, fever and chills. No significant weight loss or weight gain Eyes: Negative for eye pain, redness and discharge, diplopia, visual changes, or flashes of light. ENMT: Negative for ear pain, hoarseness, nasal congestion, sinus pressure and sore throat. No headaches; tinnitus, drooling, or problem swallowing. Cardiovascular: Negative for chest pain, palpitations, diaphoresis, dyspnea and peripheral edema. ; No orthopnea, PND Respiratory: Negative for cough, hemoptysis, wheezing and stridor. No pleuritic chestpain. Gastrointestinal: Negative for nausea, vomiting, diarrhea, constipation, abdominal pain, melena, blood in stool, hematemesis, jaundice and rectal bleeding.    Genitourinary: Negative for frequency, dysuria, incontinence,flank pain and hematuria; Musculoskeletal: Negative for back pain and neck pain. Negative for swelling and trauma.;  Skin: . Negative for pruritus, rash, abrasions, bruising and skin lesion.; ulcerations Neuro: Negative  for headache, lightheadedness and neck stiffness. Negative for weakness, altered level of consciousness , altered mental status, extremity weakness, burning feet, involuntary movement, seizure and syncope.  Psych: negative for anxiety, depression, insomnia, tearfulness, panic attacks, hallucinations, paranoia, suicidal or homicidal ideation    Past Medical History  Diagnosis Date  . Hypertension   . Obesity, morbid (more than 100 lbs over ideal weight or BMI > 40) (HCC)   . Degenerative joint disease   . Alcohol abuse   . Umbilical hernia   . Gastroesophageal reflux disease   . Vitamin B12 deficiency 05/22/2011  . Fracture of metatarsal of left foot, closed 05/28/2011  . Fracture of great toe, left, closed 05/28/2011  . Chronic respiratory failure with hypoxia (HCC) 05/28/2011  . Urticarial rash 05/31/2011    From Cipro  . Cor pulmonale, chronic [facility](HCC) February 2013  . Obesity hypoventilation syndrome [facility](HCC) February 2013  . Pickwickian syndrome (HCC)   . CHF (congestive heart failure) (HCC)   . A-fib [facility](HCC)     Past Surgical History  Procedure Laterality Date  . Cholecystectomy    . Knee surgery    . Cystoscopy    . Cystoscopy with retrograde pyelogram, ureteroscopy and stent placement Bilateral 01/18/2015    Procedure: CYSTOSCOPY WITH BILATERAL RETROGRADE PYELOGRAM, AND BILATERAL STENT PLACEMENT;  Surgeon: Malen GauzePatrick L McKenzie, MD;  Location: [facility];  Service: Urology;  Laterality: Bilateral;    Medications:  HOME MEDS: Prior to Admission medications   Medication Sig Start Date End Date Taking? Authorizing Provider  acetaminophen (TYLENOL) 500 MG tablet Take 1,000 mg by mouth every 6 (six) hours as needed for mild pain.   Yes Historical Provider, MD  allopurinol (  ZYLOPRIM) 300 MG tablet Take 300 mg by mouth daily.   Yes Historical Provider, MD  aspirin EC 81 MG tablet Take 81 mg by mouth daily.   Yes Historical Provider, MD  COLCRYS 0.6 MG tablet Take 1 tablet by mouth every 12  (twelve) hours as needed (for gout).  01/16/15  Yes Historical Provider, MD  furosemide (LASIX) 40 MG tablet Take 40 mg by mouth daily.   Yes Historical Provider, MD  metolazone (ZAROXOLYN) 2.5 MG tablet Take 2.5 mg by mouth 2 (two) times daily.    Yes Historical Provider, MD  metoprolol tartrate (LOPRESSOR) 25 MG tablet Take 25 mg by mouth 2 (two) times daily.   Yes Historical Provider, MD  omeprazole (PRILOSEC) 20 MG capsule Take 20 mg by mouth every other day.    Yes Historical Provider, MD  oxyCODONE (OXY IR/ROXICODONE) 5 MG immediate release tablet Take 1 tablet (5 mg total) by mouth every 6 (six) hours as needed for moderate pain. 01/24/15  Yes Kimber Relic, MD  potassium chloride (MICRO-K) 10 MEQ CR capsule Take 20 mEq by mouth 2 (two) times daily.    Yes Historical Provider, MD  simvastatin (ZOCOR) 10 MG tablet Take 10 mg by mouth daily. 09/28/12  Yes Historical Provider, MD  tamsulosin (FLOMAX) 0.4 MG CAPS capsule Take 1 capsule (0.4 mg total) by mouth daily. 01/20/15  Yes Henderson Cloud, MD  traZODone (DESYREL) 50 MG tablet Take 50 mg by mouth at bedtime.   Yes Historical Provider, MD  oxyCODONE-acetaminophen (PERCOCET/ROXICET) 5-325 MG tablet Take 1 tablet by mouth every 6 (six) hours as needed for moderate pain. Max 3gm APAP/24 hours from all sources Patient not taking: Reported on 02/20/2015 01/24/15   Kimber Relic, MD     Allergies:  Allergies  Allergen Reactions  . Ciprofloxacin Rash    Patient is not aware of the following allergy    Social History:   reports that he has never smoked. He does not have any smokeless tobacco history on file. He reports that he drinks about 0.6 oz of alcohol per week. He reports that he does not use illicit drugs.  Family History: Family History  Problem Relation Age of Onset  . Heart attack Father   . Heart failure Father   . Stroke Father   . Hypertension Father      Physical Exam: Filed Vitals:   02/22/15 1430  02/22/15 1445 02/22/15 1450 02/22/15 1455  BP: 106/70 98/74    Pulse: 94 96 95 98  Temp:      Resp: SpO2: 93% 93% 95% 95%   Blood pressure 98/74, pulse 98, temperature 98.3 F (36.8 C), resp. rate 23, SpO2 95 %.  GEN:  Pleasant  patient lying in the stretcher in no acute distress; cooperative with exam. PSYCH:  alert and oriented x4; does not appear anxious or depressed; affect is appropriate. HEENT: Mucous membranes pink and anicteric; PERRLA; EOM intact; no cervical lymphadenopathy nor thyromegaly or carotid bruit; no JVD; There were no stridor. Neck is very supple. Breasts:: Not examined CHEST WALL: No tenderness CHEST: Normal respiration, clear to auscultation bilaterally.  HEART: Regular rate and rhythm.  There are no murmur, rub, or gallops.   BACK: No kyphosis or scoliosis; no CVA tenderness ABDOMEN: soft and non-tender; no masses, no organomegaly, normal abdominal bowel sounds; no pannus; no intertriginous candida. There is no rebound and no distention. Rectal Exam: Not done EXTREMITIES: No bone or joint  deformity; age-appropriate arthropathy of the hands and knees; no edema; no ulcerations.  There is no calf tenderness. Genitalia: not examined PULSES: 2+ and symmetric SKIN: Normal hydration no rash or ulceration CNS: Cranial nerves 2-12 grossly intact no focal lateralizing neurologic deficit.  Speech is fluent; uvula elevated with phonation, facial symmetry and tongue midline. DTR are normal bilaterally, cerebella exam is intact, barbinski is negative and strengths are equaled bilaterally.  No sensory loss.   Labs on Admission:  Basic Metabolic Panel:  Recent Labs Lab 02/16/15 0800 02/20/15 1015  NA 137 139  K 3.2* 3.1*  CL 97* 98*  CO2 31 30  GLUCOSE 97 109*  BUN 41* 24*  CREATININE 0.93 0.91  CALCIUM 10.6* 10.9*   Liver Function Tests:  Recent Labs Lab 02/16/15 0800  AST 16  ALT 14*  ALKPHOS 83  BILITOT 0.7  PROT 6.6  ALBUMIN 3.7    CBC:  Recent Labs Lab 02/16/15 0800 02/20/15 1015  WBC 3.9* 10.7*  NEUTROABS 2.3 9.5*  HGB 13.2 13.8  HCT 40.6 43.1  MCV 89.2 90.4  PLT 116* 130*   Radiological Exams on Admission: Dg Retrograde Pyelogram  02/22/2015  CLINICAL DATA:  64 year old male with a history of retrograde pyelogram and nephrolithiasis. EXAM: RETROGRADE PYELOGRAM COMPARISON:  01/18/2015, prior CT 01/17/2015 FINDINGS: Multiple intraoperative fluoroscopic spot images during retrograde pyelogram. Initial image demonstrates scope and safety wire projecting over the proximal left collecting system. Subsequently there is partial opacification of the proximal left ureter and the left collecting system. There appears to be mild pelvicaliectasis, however, the left system is not entirely imaged. Evidence of left-sided ureteral stent placement. Subsequently there is a safety wire in the right collecting system with passage of a stent into the right collecting system. Partial opacification of the right collecting system demonstrating mild pelvicaliectasis. IMPRESSION: Images during bilateral retrograde pyelogram demonstrates placement of bilateral ureteral stents as well as evidence of mild bilateral pelvicaliectasis. Please refer to the dictated operative report for full details of intraoperative findings and procedure. Signed, Yvone Neu. Loreta Ave, DO [facility] Radiology Electronically Signed   By: Gilmer Mor D.O.   On: 02/22/2015 14:25    Assessment/Plan Present on Admission:  . Ureteral calculi . Atrial fibrillation with RVR (HCC) . Alcohol abuse . AKI (acute kidney injury) (HCC) . Essential hypertension . Morbid obesity (HCC) . Urinary tract infection, site not specified . Atrial fibrillation (HCC)   PLAN:  Will admit him to SDU.  Restart his betablocker orally.   Use IV cardiazem as needed for rate controlled.  Doubt PE, and will cycle his troponins.  Place him on  CIWA protocol with PRN Ativan.  Will replete his K, and start him on Rocephin.  I spoke with Dr Ronne Binning, and he would like to continue his antibiotic for another 2 weeks.  WIll give him Rocephin IV as he has a rash with quinolones.  Will need to speak with him about anticoagulation as he is a CHADS 2.   Will give ASA today.   Other plans as per orders.  Code Status: FULL CODE>    Houston Siren, MD. [facility] Pager [phone] 7pm to 7am.  02/22/2015, 3:12 PM

## 2015-02-22 NOTE — Anesthesia Preprocedure Evaluation (Deleted)
Anesthesia Evaluation    Airway        Dental   Pulmonary           Cardiovascular hypertension,      Neuro/Psych    GI/Hepatic   Endo/Other    Renal/GU      Musculoskeletal   Abdominal   Peds  Hematology   Anesthesia Other Findings   Reproductive/Obstetrics                            Anesthesia Physical Anesthesia Plan Anesthesia Quick Evaluation                                  Anesthesia Evaluation  Patient identified by MRN, date of birth, ID band Patient awake    Reviewed: Allergy & Precautions, NPO status , Patient's Chart, lab work & pertinent test results, reviewed documented beta blocker date and time   Airway Mallampati: III  TM Distance: >3 FB     Dental  (+) Missing,    Pulmonary sleep apnea ,     + decreased breath sounds      Cardiovascular Exercise Tolerance: Poor hypertension, Pt. on medications + CAD and +CHF  + dysrhythmias Atrial Fibrillation IV Rhythm:Irregular     Neuro/Psych    GI/Hepatic GERD  Medicated,  Endo/Other  Morbid obesity  Renal/GU Renal InsufficiencyRenal disease     Musculoskeletal  (+) Arthritis , Osteoarthritis,    Abdominal (+) + obese,   Peds  Hematology   Anesthesia Other Findings   Reproductive/Obstetrics                             Anesthesia Physical Anesthesia Plan  ASA: IV  Anesthesia Plan: General   Post-op Pain Management:    Induction: Intravenous  Airway Management Planned: Oral ETT  Additional Equipment:   Intra-op Plan:   Post-operative Plan: Extubation in OR  Informed Consent: I have reviewed the patients History and Physical, chart, labs and discussed the procedure including the risks, benefits and alternatives for the proposed anesthesia with the patient or authorized representative who has indicated his/her understanding and acceptance.   Dental advisory  given  Plan Discussed with: CRNA  Anesthesia Plan Comments:         Anesthesia Quick Evaluation

## 2015-02-22 NOTE — Transfer of Care (Signed)
Immediate Anesthesia Transfer of Care Note  Patient: Frederick CanterburyJames H Piloto  Procedure(s) Performed: Procedure(s): CYSTOSCOPY/BILATERAL RETROGRADE/BILATERAL URETEROSCOPY/BILATERAL STONE EXTRACTION WITH BASKET (Bilateral) CYSTOSCOPY WITH HOLMIUM LASER BILATERAL LITHOTRIPSY (Bilateral) CYSTOSCOPY WITH BILATERAL STENT EXCHANGE (Bilateral)  Patient Location: PACU  Anesthesia Type:General  Level of Consciousness: awake and patient cooperative  Airway & Oxygen Therapy: Patient Spontanous Breathing and non-rebreather face mask  Post-op Assessment: Report given to RN, Post -op Vital signs reviewed and stable and Patient moving all extremities  Post vital signs: Reviewed and stable   Complications: No apparent anesthesia complications

## 2015-02-22 NOTE — H&P (Signed)
Urology Admission H&P  Chief Complaint: bilateral flank pain  History of Present Illness: [name] is a 64yo with a hx of nephrolithiasis who is s/p bilateral stent placement for sepsis 1 month ago. He presents today for definitive management of his stones. He has mild LUTS with the stents  Past Medical History  Diagnosis Date  . Hypertension   . Obesity, morbid (more than 100 lbs over ideal weight or BMI > 40) (HCC)   . Degenerative joint disease   . Alcohol abuse   . Umbilical hernia   . Gastroesophageal reflux disease   . Vitamin B12 deficiency 05/22/2011  . Fracture of metatarsal of left foot, closed 05/28/2011  . Fracture of great toe, left, closed 05/28/2011  . Chronic respiratory failure with hypoxia (HCC) 05/28/2011  . Urticarial rash 05/31/2011    From Cipro  . Cor pulmonale, chronic [facility](HCC) February 2013  . Obesity hypoventilation syndrome [facility](HCC) February 2013  . Pickwickian syndrome (HCC)   . CHF (congestive heart failure) (HCC)   . A-fib [facility](HCC)    Past Surgical History  Procedure Laterality Date  . Cholecystectomy    . Knee surgery    . Cystoscopy    . Cystoscopy with retrograde pyelogram, ureteroscopy and stent placement Bilateral 01/18/2015    Procedure: CYSTOSCOPY WITH BILATERAL RETROGRADE PYELOGRAM, AND BILATERAL STENT PLACEMENT;  Surgeon: Frederick GauzePatrick L Kalden Wanke, MD;  Location: [facility];  Service: Urology;  Laterality: Bilateral;    Home Medications:  Prescriptions prior to admission  Medication Sig Dispense Refill Last Dose  . acetaminophen (TYLENOL) 500 MG tablet Take 1,000 mg by mouth every 6 (six) hours as needed for mild pain.   Past Week at Unknown time  . allopurinol (ZYLOPRIM) 300 MG tablet Take 300 mg by mouth daily.   02/21/2015 at Unknown time  . aspirin EC 81 MG tablet Take 81 mg by mouth daily.   02/21/2015 at Unknown time  . COLCRYS 0.6 MG tablet Take 1 tablet by mouth every 12 (twelve) hours as needed (for gout).   10 Past Week at Unknown time  . furosemide  (LASIX) 40 MG tablet Take 40 mg by mouth daily.   02/21/2015 at Unknown time  . metolazone (ZAROXOLYN) 2.5 MG tablet Take 2.5 mg by mouth 2 (two) times daily.    02/21/2015 at Unknown time  . metoprolol tartrate (LOPRESSOR) 25 MG tablet Take 25 mg by mouth 2 (two) times daily.   02/16/2015  . omeprazole (PRILOSEC) 20 MG capsule Take 20 mg by mouth every other day.    Past Week at Unknown time  . oxyCODONE (OXY IR/ROXICODONE) 5 MG immediate release tablet Take 1 tablet (5 mg total) by mouth every 6 (six) hours as needed for moderate pain. 120 tablet 0 02/21/2015 at Unknown time  . potassium chloride (MICRO-K) 10 MEQ CR capsule Take 20 mEq by mouth 2 (two) times daily.    02/21/2015 at Unknown time  . simvastatin (ZOCOR) 10 MG tablet Take 10 mg by mouth daily.   02/21/2015 at Unknown time  . tamsulosin (FLOMAX) 0.4 MG CAPS capsule Take 1 capsule (0.4 mg total) by mouth daily. 30 capsule 2 02/21/2015 at Unknown time  . traZODone (DESYREL) 50 MG tablet Take 50 mg by mouth at bedtime.   02/21/2015 at Unknown time  . cephALEXin (KEFLEX) 500 MG capsule Take 1 capsule (500 mg total) by mouth 3 (three) times daily. Until seen by Dr. Annabell HowellsWrenn (Patient not taking: Reported on 02/20/2015)     . oxyCODONE-acetaminophen (  PERCOCET/ROXICET) 5-325 MG tablet Take 1 tablet by mouth every 6 (six) hours as needed for moderate pain. Max 3gm APAP/24 hours from all sources (Patient not taking: Reported on 02/20/2015) 120 tablet 0    Allergies:  Allergies  Allergen Reactions  . Ciprofloxacin Rash    Patient is not aware of the following allergy    Family History  Problem Relation Age of Onset  . Heart attack Father   . Heart failure Father   . Stroke Father   . Hypertension Father    Social History:  reports that he has never smoked. He does not have any smokeless tobacco history on file. He reports that he drinks about 0.6 oz of alcohol per week. He reports that he does not use illicit drugs.  Review of Systems   All other systems reviewed and are negative.   Physical Exam:  Vital signs in last 24 hours: Temp:  [98.2 F (36.8 C)] 98.2 F (36.8 C) (11/23 1115) Pulse Rate:  [89] 89 (11/23 1115) BP: (125)/(81) 125/81 mmHg (11/23 1115) SpO2:  [96 %] 96 % (11/23 1115) Physical Exam  Constitutional: He is oriented to person, place, and time. He appears well-developed and well-nourished.  HENT:  Head: Normocephalic and atraumatic.  Eyes: EOM are normal. Pupils are equal, round, and reactive to light.  Neck: Normal range of motion. No thyromegaly present.  Cardiovascular: Normal rate and regular rhythm.   Respiratory: Effort normal. No respiratory distress.  GI: Soft. He exhibits no distension.  Musculoskeletal: Normal range of motion.  Neurological: He is alert and oriented to person, place, and time.  Skin: Skin is warm and dry.  Psychiatric: He has a normal mood and affect. His behavior is normal. Judgment and thought content normal.    Laboratory Data:  No results found for this or any previous visit (from the past 24 hour(s)). No results found for this or any previous visit (from the past 240 hour(s)). Creatinine:  Recent Labs  02/16/15 0800 02/20/15 1015  CREATININE 0.93 0.91   Baseline Creatinine: 0.9  Impression/Assessment:  64yo with bilateral nephrolithiasis  Plan:  The risks/benefits/alternatives to cysto, bilateral retrogrades, bilateral stone extraction was explained to the patient and he understands and wishes to proceed with surgery  [name] L 02/22/2015, 11:51 AM

## 2015-02-22 NOTE — Anesthesia Procedure Notes (Signed)
Procedure Name: Intubation Date/Time: 02/22/2015 12:35 PM Performed by: Franco NonesYATES, Anthonio Mizzell S Pre-anesthesia Checklist: Patient identified, Patient being monitored, Timeout performed, Emergency Drugs available and Suction available Patient Re-evaluated:Patient Re-evaluated prior to inductionOxygen Delivery Method: Circle System Utilized Preoxygenation: Pre-oxygenation with 100% oxygen Intubation Type: IV induction, Rapid sequence and Cricoid Pressure applied Laryngoscope Size: Mac and 3 Grade View: Grade I Tube type: Oral Tube size: 7.0 mm Number of attempts: 1 Airway Equipment and Method: Stylet and Oral airway Placement Confirmation: ETT inserted through vocal cords under direct vision,  positive ETCO2 and breath sounds checked- equal and bilateral Secured at: 22 cm Tube secured with: Tape Dental Injury: Teeth and Oropharynx as per pre-operative assessment

## 2015-02-22 NOTE — Anesthesia Preprocedure Evaluation (Signed)
Anesthesia Evaluation  Patient identified by MRN, date of birth, ID band Patient awake    Reviewed: Allergy & Precautions, NPO status , Patient's Chart, lab work & pertinent test results, reviewed documented beta blocker date and time   Airway Mallampati: III  TM Distance: >3 FB     Dental  (+) Missing,    Pulmonary sleep apnea ,     + decreased breath sounds      Cardiovascular Exercise Tolerance: Poor hypertension, Pt. on medications + CAD and +CHF  + dysrhythmias Atrial Fibrillation IV Rhythm:Irregular     Neuro/Psych    GI/Hepatic GERD  Medicated,  Endo/Other  Morbid obesity  Renal/GU Renal InsufficiencyRenal disease     Musculoskeletal  (+) Arthritis , Osteoarthritis,    Abdominal (+) + obese,   Peds  Hematology   Anesthesia Other Findings   Reproductive/Obstetrics                             Anesthesia Physical Anesthesia Plan  ASA: IV  Anesthesia Plan: General   Post-op Pain Management:    Induction: Intravenous  Airway Management Planned: Oral ETT  Additional Equipment:   Intra-op Plan:   Post-operative Plan: Extubation in OR  Informed Consent: I have reviewed the patients History and Physical, chart, labs and discussed the procedure including the risks, benefits and alternatives for the proposed anesthesia with the patient or authorized representative who has indicated his/her understanding and acceptance.   Dental advisory given  Plan Discussed with: CRNA  Anesthesia Plan Comments:         Anesthesia Quick Evaluation  

## 2015-02-23 DIAGNOSIS — I10 Essential (primary) hypertension: Secondary | ICD-10-CM | POA: Diagnosis present

## 2015-02-23 DIAGNOSIS — N201 Calculus of ureter: Secondary | ICD-10-CM | POA: Diagnosis not present

## 2015-02-23 DIAGNOSIS — F1010 Alcohol abuse, uncomplicated: Secondary | ICD-10-CM | POA: Diagnosis present

## 2015-02-23 DIAGNOSIS — Z6841 Body Mass Index (BMI) 40.0 and over, adult: Secondary | ICD-10-CM | POA: Diagnosis not present

## 2015-02-23 DIAGNOSIS — Z8249 Family history of ischemic heart disease and other diseases of the circulatory system: Secondary | ICD-10-CM | POA: Diagnosis not present

## 2015-02-23 DIAGNOSIS — N179 Acute kidney failure, unspecified: Secondary | ICD-10-CM | POA: Diagnosis present

## 2015-02-23 DIAGNOSIS — Z87442 Personal history of urinary calculi: Secondary | ICD-10-CM | POA: Diagnosis not present

## 2015-02-23 DIAGNOSIS — M1990 Unspecified osteoarthritis, unspecified site: Secondary | ICD-10-CM | POA: Diagnosis present

## 2015-02-23 DIAGNOSIS — N202 Calculus of kidney with calculus of ureter: Secondary | ICD-10-CM | POA: Diagnosis present

## 2015-02-23 DIAGNOSIS — Z9119 Patient's noncompliance with other medical treatment and regimen: Secondary | ICD-10-CM | POA: Diagnosis not present

## 2015-02-23 DIAGNOSIS — N390 Urinary tract infection, site not specified: Secondary | ICD-10-CM | POA: Diagnosis present

## 2015-02-23 DIAGNOSIS — Z823 Family history of stroke: Secondary | ICD-10-CM | POA: Diagnosis not present

## 2015-02-23 DIAGNOSIS — E876 Hypokalemia: Secondary | ICD-10-CM | POA: Diagnosis present

## 2015-02-23 DIAGNOSIS — I482 Chronic atrial fibrillation: Secondary | ICD-10-CM | POA: Diagnosis not present

## 2015-02-23 DIAGNOSIS — Z881 Allergy status to other antibiotic agents status: Secondary | ICD-10-CM | POA: Diagnosis not present

## 2015-02-23 DIAGNOSIS — K219 Gastro-esophageal reflux disease without esophagitis: Secondary | ICD-10-CM | POA: Diagnosis present

## 2015-02-23 DIAGNOSIS — I4891 Unspecified atrial fibrillation: Secondary | ICD-10-CM | POA: Diagnosis present

## 2015-02-23 LAB — BASIC METABOLIC PANEL
ANION GAP: 8 (ref 5–15)
BUN: 31 mg/dL — AB (ref 6–20)
CO2: 28 mmol/L (ref 22–32)
CREATININE: 1.03 mg/dL (ref 0.61–1.24)
Calcium: 9.7 mg/dL (ref 8.9–10.3)
Chloride: 100 mmol/L — ABNORMAL LOW (ref 101–111)
GFR calc non Af Amer: >60 mL/min (ref >60)
GLUCOSE: 107 mg/dL — AB (ref 65–99)
Potassium: 3.7 mmol/L (ref 3.5–5.1)
SODIUM: 136 mmol/L (ref 135–145)

## 2015-02-23 LAB — TROPONIN I

## 2015-02-23 LAB — CBC
HEMATOCRIT: 37.1 % — AB (ref 39.0–52.0)
Hemoglobin: 12.1 g/dL — ABNORMAL LOW (ref 13.0–17.0)
MCH: 28.9 pg (ref 26.0–34.0)
MCHC: 32.6 g/dL (ref 30.0–36.0)
MCV: 88.8 fL (ref 78.0–100.0)
Platelets: 136 10*3/uL — ABNORMAL LOW (ref 150–400)
RBC: 4.18 MIL/uL — ABNORMAL LOW (ref 4.22–5.81)
RDW: 16.1 % — AB (ref 11.5–15.5)
WBC: 6.9 10*3/uL (ref 4.0–10.5)

## 2015-02-23 MED ORDER — DILTIAZEM HCL 100 MG IV SOLR
2.5000 mg/h | INTRAVENOUS | Status: DC
  Administered 2015-02-23 (×2): 2.5 mg/h via INTRAVENOUS

## 2015-02-23 MED ORDER — DILTIAZEM HCL 100 MG IV SOLR
INTRAVENOUS | Status: AC
  Administered 2015-02-23: 2.5 mg/h via INTRAVENOUS
  Filled 2015-02-23: qty 100

## 2015-02-23 MED ORDER — METOPROLOL TARTRATE 50 MG PO TABS
50.0000 mg | ORAL_TABLET | Freq: Two times a day (BID) | ORAL | Status: DC
  Administered 2015-02-23 – 2015-02-25 (×4): 50 mg via ORAL
  Filled 2015-02-23 (×5): qty 1

## 2015-02-23 MED ORDER — SODIUM CHLORIDE 0.9 % IV BOLUS (SEPSIS)
500.0000 mL | Freq: Once | INTRAVENOUS | Status: AC
  Administered 2015-02-23: 500 mL via INTRAVENOUS

## 2015-02-23 NOTE — Progress Notes (Signed)
Patient had a run of V-tach of which he converted himself. -ECG printed out and placed on the chart -[facility] hospitalist contacted (MD Lovell SheehanJenkins) contacted and informed/updated on the patient's condition  -orders to be placed (per MD) Patient is alert and oriented and not reporting an discomfort.

## 2015-02-23 NOTE — Progress Notes (Signed)
[facility] PROGRESS NOTE  Frederick CanterburyJames H Moffa ZOX:[id number] DOB: 21-Oct-1950    PCP:   Trinna PostKOBERLEIN, JUNELL CAROL, MD   HPI: Frederick Brown is an 64 y.o. male admitted for afib with RVR during urological procedure.  He was placed in the ICU anticipating the need for IV Diltiazem drip, and he was started on it last night. We discussed anticoagulation, and he will follow up with cardiology outpatient upon his discharge.  He is still requiring IV cardiazem drip today.   Rewiew of Systems:  Constitutional: Negative for malaise, fever and chills. No significant weight loss or weight gain Eyes: Negative for eye pain, redness and discharge, diplopia, visual changes, or flashes of light. ENMT: Negative for ear pain, hoarseness, nasal congestion, sinus pressure and sore throat. No headaches; tinnitus, drooling, or problem swallowing. Cardiovascular: Negative for chest pain, palpitations, diaphoresis, dyspnea and peripheral edema. ; No orthopnea, PND Respiratory: Negative for cough, hemoptysis, wheezing and stridor. No pleuritic chestpain. Gastrointestinal: Negative for nausea, vomiting, diarrhea, constipation, abdominal pain, melena, blood in stool, hematemesis, jaundice and rectal bleeding.    Genitourinary: Negative for frequency, dysuria, incontinence,flank pain and hematuria; Musculoskeletal: Negative for back pain and neck pain. Negative for swelling and trauma.;  Skin: . Negative for pruritus, rash, abrasions, bruising and skin lesion.; ulcerations Neuro: Negative for headache, lightheadedness and neck stiffness. Negative for weakness, altered level of consciousness , altered mental status, extremity weakness, burning feet, involuntary movement, seizure and syncope.  Psych: negative for anxiety, depression, insomnia, tearfulness, panic attacks, hallucinations, paranoia, suicidal or homicidal ideation    Past Medical History  Diagnosis Date  . Hypertension   . Obesity, morbid (more than 100  lbs over ideal weight or BMI > 40) (HCC)   . Degenerative joint disease   . Alcohol abuse   . Umbilical hernia   . Gastroesophageal reflux disease   . Vitamin B12 deficiency 05/22/2011  . Fracture of metatarsal of left foot, closed 05/28/2011  . Fracture of great toe, left, closed 05/28/2011  . Chronic respiratory failure with hypoxia (HCC) 05/28/2011  . Urticarial rash 05/31/2011    From Cipro  . Cor pulmonale, chronic [facility](HCC) February 2013  . Obesity hypoventilation syndrome [facility](HCC) February 2013  . Pickwickian syndrome (HCC)   . CHF (congestive heart failure) (HCC)   . A-fib [facility](HCC)     Past Surgical History  Procedure Laterality Date  . Cholecystectomy    . Knee surgery    . Cystoscopy    . Cystoscopy with retrograde pyelogram, ureteroscopy and stent placement Bilateral 01/18/2015    Procedure: CYSTOSCOPY WITH BILATERAL RETROGRADE PYELOGRAM, AND BILATERAL STENT PLACEMENT;  Surgeon: Malen GauzePatrick L McKenzie, MD;  Location: [facility];  Service: Urology;  Laterality: Bilateral;    Medications:  HOME MEDS: Prior to Admission medications   Medication Sig Start Date End Date Taking? Authorizing Provider  acetaminophen (TYLENOL) 500 MG tablet Take 1,000 mg by mouth every 6 (six) hours as needed for mild pain.   Yes Historical Provider, MD  allopurinol (ZYLOPRIM) 300 MG tablet Take 300 mg by mouth daily.   Yes Historical Provider, MD  aspirin EC 81 MG tablet Take 81 mg by mouth daily.   Yes Historical Provider, MD  COLCRYS 0.6 MG tablet Take 1 tablet by mouth every 12 (twelve) hours as needed (for gout).  01/16/15  Yes Historical Provider, MD  furosemide (LASIX) 40 MG tablet Take 40 mg by mouth daily.   Yes Historical Provider, MD  metolazone (ZAROXOLYN) 2.5 MG tablet Take 2.5  mg by mouth 2 (two) times daily.    Yes Historical Provider, MD  metoprolol tartrate (LOPRESSOR) 25 MG tablet Take 25 mg by mouth 2 (two) times daily.   Yes Historical Provider, MD  omeprazole (PRILOSEC) 20 MG capsule Take 20 mg  by mouth every other day.    Yes Historical Provider, MD  oxyCODONE (OXY IR/ROXICODONE) 5 MG immediate release tablet Take 1 tablet (5 mg total) by mouth every 6 (six) hours as needed for moderate pain. 01/24/15  Yes Kimber Relic, MD  potassium chloride (MICRO-K) 10 MEQ CR capsule Take 20 mEq by mouth 2 (two) times daily.    Yes Historical Provider, MD  simvastatin (ZOCOR) 10 MG tablet Take 10 mg by mouth daily. 09/28/12  Yes Historical Provider, MD  tamsulosin (FLOMAX) 0.4 MG CAPS capsule Take 1 capsule (0.4 mg total) by mouth daily. 01/20/15  Yes Henderson Cloud, MD  traZODone (DESYREL) 50 MG tablet Take 50 mg by mouth at bedtime.   Yes Historical Provider, MD  oxyCODONE-acetaminophen (PERCOCET/ROXICET) 5-325 MG tablet Take 1 tablet by mouth every 6 (six) hours as needed for moderate pain. Max 3gm APAP/24 hours from all sources Patient not taking: Reported on 02/20/2015 01/24/15   Kimber Relic, MD     Allergies:  Allergies  Allergen Reactions  . Ciprofloxacin Rash    Patient is not aware of the following allergy    Social History:   reports that he has never smoked. He does not have any smokeless tobacco history on file. He reports that he drinks about 0.6 oz of alcohol per week. He reports that he does not use illicit drugs.  Family History: Family History  Problem Relation Age of Onset  . Heart attack Father   . Heart failure Father   . Stroke Father   . Hypertension Father      Physical Exam: Filed Vitals:   02/23/15 0600 02/23/15 0615 02/23/15 0630 02/23/15 0747  BP: 102/73 109/82 112/79   Pulse: 100 100 107   Temp:    98 F (36.7 C)  TempSrc:    Oral  Resp: Height:      Weight:      SpO2: 91% 99% 98%    Blood pressure 112/79, pulse 107, temperature 98 F (36.7 C), temperature source Oral, resp. rate 16, height  (1.778 m), weight 146.8 kg (323 lb 10.2 oz), SpO2 98 %.  GEN:  Pleasant  patient lying in the stretcher in no acute  distress; cooperative with exam. PSYCH:  alert and oriented x4; does not appear anxious or depressed; affect is appropriate. HEENT: Mucous membranes pink and anicteric; PERRLA; EOM intact; no cervical lymphadenopathy nor thyromegaly or carotid bruit; no JVD; There were no stridor. Neck is very supple. Breasts:: Not examined CHEST WALL: No tenderness CHEST: Normal respiration, clear to auscultation bilaterally.  HEART: Regular rate and rhythm.  There are no murmur, rub, or gallops.   BACK: No kyphosis or scoliosis; no CVA tenderness ABDOMEN: soft and non-tender; no masses, no organomegaly, normal abdominal bowel sounds; no pannus; no intertriginous candida. There is no rebound and no distention. Rectal Exam: Not done EXTREMITIES: No bone or joint deformity; age-appropriate arthropathy of the hands and knees; no edema; no ulcerations.  There is no calf tenderness. Genitalia: not examined PULSES: 2+ and symmetric SKIN: Normal hydration no rash or ulceration CNS: Cranial nerves 2-12 grossly intact no focal lateralizing neurologic deficit.  Speech is fluent; uvula elevated  with phonation, facial symmetry and tongue midline. DTR are normal bilaterally, cerebella exam is intact, barbinski is negative and strengths are equaled bilaterally.  No sensory loss.   Labs on Admission:  Basic Metabolic Panel:  Recent Labs Lab 02/20/15 1015 02/22/15 1604 02/23/15 0438  NA 139 136 136  K 3.1* 3.2* 3.7  CL 98* 96* 100*  CO2 GLUCOSE 109* 121* 107*  BUN 24* 28* 31*  CREATININE 0.91 1.09 1.03  CALCIUM 10.9* 10.2 9.7   CBC:  Recent Labs Lab 02/20/15 1015 02/22/15 1604 02/23/15 0438  WBC 10.7* 9.9 6.9  NEUTROABS 9.5*  --   --   HGB 13.8 13.1 12.1*  HCT 43.1 39.6 37.1*  MCV 90.4 89.0 88.8  PLT 130* 124* 136*   Cardiac Enzymes:  Recent Labs Lab 02/22/15 1604 02/22/15 2205 02/23/15 0438  TROPONINI <0.03 <0.03 <0.03   Radiological Exams on Admission: Dg Retrograde  Pyelogram  02/22/2015  CLINICAL DATA:  65 year old male with a history of retrograde pyelogram and nephrolithiasis. EXAM: RETROGRADE PYELOGRAM COMPARISON:  01/18/2015, prior CT 01/17/2015 FINDINGS: Multiple intraoperative fluoroscopic spot images during retrograde pyelogram. Initial image demonstrates scope and safety wire projecting over the proximal left collecting system. Subsequently there is partial opacification of the proximal left ureter and the left collecting system. There appears to be mild pelvicaliectasis, however, the left system is not entirely imaged. Evidence of left-sided ureteral stent placement. Subsequently there is a safety wire in the right collecting system with passage of a stent into the right collecting system. Partial opacification of the right collecting system demonstrating mild pelvicaliectasis. IMPRESSION: Images during bilateral retrograde pyelogram demonstrates placement of bilateral ureteral stents as well as evidence of mild bilateral pelvicaliectasis. Please refer to the dictated operative report for full details of intraoperative findings and procedure. Signed, Yvone Neu. Loreta Ave, DO [facility] Radiology Electronically Signed   By: Gilmer Mor D.O.   On: 02/22/2015 14:25    Assessment/Plan Present on Admission:  . Ureteral calculi . Atrial fibrillation with RVR (HCC) . Alcohol abuse . AKI (acute kidney injury) (HCC) . Essential hypertension . Morbid obesity (HCC) . Urinary tract infection, site not specified . Atrial fibrillation (HCC)  PLAN:  Afib with RVR:  Continue with IV Cardiazem today.  Increase his Lopressor to  BID.  TSH and troponins were OK.  Anticoagulation consideration in follow up, due to previous non compliant with Dr Adair Patter. CHADS 2.  Continue with ASA for now.  Keep in SDU.   Nephrolithiasis:  Needs antibiotics for 14 days total since admission per urology.  HypoK:  Repleted.   Hx of  alcohol abuse:  No evidence of withdrawal.  On CIWA with PRN Ativan.   Other plans as per orders.  Code Status: FULL Unk Lightning, MD. [facility] Pager [phone] 7pm to 7am.  02/23/2015, 9:18 AM

## 2015-02-23 NOTE — Progress Notes (Signed)
Provider to the bedside. -orders placed

## 2015-02-24 NOTE — Care Management Important Message (Signed)
Important Message  Patient Details  Name: Frederick Brown MRN: [id number] Date of Birth: 06-Dec-1950   Medicare Important Message Given:  Yes    [name], [name], RN 02/24/2015, 3:42 PM

## 2015-02-24 NOTE — Clinical Social Work Note (Signed)
Clinical Social Work Assessment  Patient Details  Name: Frederick Brown MRN: [id number] Date of Birth: 10-09-1950  Date of referral:  02/24/15               Reason for consult:  Facility Placement                Permission sought to share information with:    Permission granted to share information::     Name::        Agency::     Relationship::     Contact Information:     Housing/Transportation Living arrangements for the past 2 months:  Single Family Home, Skilled Nursing Facility Source of Information:  Patient Patient Interpreter Needed:  None Criminal Activity/Legal Involvement Pertinent to Current Situation/Hospitalization:  No - Comment as needed Significant Relationships:  Other(Comment) Transport planner(Neighbors) Lives with:  Self Do you feel safe going back to the place where you live?  Yes Need for family participation in patient care:  Yes (Comment)  Care giving concerns: None identified by patient.    Social Worker assessment / plan:  Patient stated that [facility] after being [facility] four weeks for rehab.  Patient advised that he lives alone and that his neighbors are very close to him. He stated that his neighbors had a meeting last Friday at his home to identify how they could assists him in his home.  CSW discussed the SNF referral. Patient stated that he currently did not desire to go back to SNF.  He stated that he has HH through [facility] and he wants to try that. CSW signing off.   Employment status:  Disabled (Comment on whether or not currently receiving Disability) Insurance information:  Managed Medicare PT Recommendations:  Skilled Nursing Facility Information / Referral to community resources:  Skilled Nursing Facility  Patient/Family's Response to care:  [facility] and not return to SNF.   Patient/Family's Understanding of and Emotional Response to Diagnosis, Current Treatment, and Prognosis:   Patient understands his diagnosis, treatment and prognosis.   Emotional Assessment Appearance:  Appears stated age Attitude/Demeanor/Rapport:   (Cooperative) Affect (typically observed):  Appropriate Orientation:  [name], [name], [name], [name] Alcohol / Substance use:  Not Applicable Psych involvement (Current and /or in the community):  No (Comment)  Discharge Needs  Concerns to be addressed:  No discharge needs identified Readmission within the last 30 days:  Yes Current discharge risk:  Dependent with Mobility Barriers to Discharge:  No Barriers Identified   Annice NeedySettle, Frederick Galiano D, LCSW 02/24/2015, 4:56 PM [phone]

## 2015-02-24 NOTE — Op Note (Signed)
Preoperative diagnosis: Left renal stone, right ureteral stone  Postoperative diagnosis: Same  Procedure: 1 cystoscopy 2. bilateral retrograde pyelography 3.  Intraoperative fluoroscopy, under one hour, with interpretation 4. bilateral ureteroscopic stone manipulation with laser lithotripsy 5.  bilateral 6 x 26 JJ stent placement  Attending: Wilkie AyePatrick Mckenzie  Anesthesia: General  Estimated blood loss: None  Drains: bilateral 6 x 26 JJ ureteral stent without tether  Specimens: stone  Antibiotics: rocephin  Findings: left upper pole stone. 2 right proximal ureteral stones No hydronephrosis bilaterally. No masses/lesions in the bladder. Ureteral orifices in normal anatomic location.  Indications: Patient is a 64 year old male with a history of left renal stone and right ureteral so and who previously had bilateral stents placed due to sepsis  After discussing treatment options, he decided proceed with bilateral ureteroscopic stone manipulation.  Procedure her in detail: The patient was brought to the operating room and a brief timeout was done to ensure correct patient, correct procedure, correct site.  General anesthesia was administered patient was placed in dorsal lithotomy position. Their genitalia was then prepped and draped in usual sterile fashion.  A rigid 22 French cystoscope was passed in the urethra and the bladder.  Bladder was inspected free masses or lesions.  the ureteral orifices were in the normal orthotopic locations. Using a grasper the left stent was brought to the urethral meatus. A zipwire was then advanced through the stent and up to the renal pelvis. The stent was then removed.  a 6 french ureteral catheter was then instilled into the left ureteral orifice.  a gentle retrograde was obtained and findings noted above.  we then placed a zip wire through the ureteral catheter and advanced up to the renal pelvis.  we then removed the cystoscope and cannulated the left  ureteral orifice with a semirigid ureteroscope.  No stone was found in the ureter. Once we reached the UPJ a sensor wire was advanced in to the renal pelvis. We then removed the ureteroscope and advanced a 12/14x38cm access sheath up to the renal pelvis We used the flexible ureteroscope to perform nephroscopy. We encountered the stone in the upper pole using a 200 nm laser fiber and fragmented the stone into smaller pieces.  the pieces were then removed with a Ngage basket. once all stone fragments were removed we removed the access sheath under direct vision and noted no injury to the ureter. We then placed a 6 x 26 double-j ureteral stent over the original zip wire.    We then removed the wire and good coil was noted in the the renal pelvis under fluoroscopy and the bladder under direct vision.   We then turned our attention to the right side.  Using a grasper the right stent was brought to the urethral meatus. A zipwire was then advanced through the stent and up to the renal pelvis. The stent was then removed.  a 6 french ureteral catheter was then instilled into the right ureteral orifice.  a gentle retrograde was obtained and findings noted above.  we then placed a zip wire through the ureteral catheter and advanced up to the renal pelvis.  we then removed the cystoscope and cannulated the left ureteral orifice with a semirigid ureteroscope.  We encountered 2 stones in the proximal ureter. using a 200 nm laser fiber and fragmented the stone into smaller pieces.  the pieces were then removed with a Ngage basket. once all stone fragments were removed we then placed a 6 x 26 double-j  ureteral stent over the original zip wire.    We then removed the wire and good coil was noted in the the renal pelvis under fluoroscopy and the bladder under direct vision. the stone fragments were then removed from the bladder and sent for analysis.   the bladder was then drained and this concluded the procedure which was well  tolerated by patient.  Complications: None  Condition: Stable, extubated, transferred to PACU  Plan: Patient is to be discharged home as to follow-up in one week for stent removal.

## 2015-02-24 NOTE — Care Management Note (Signed)
Case Management Note  Patient Details  Name: Clyde CanterburyJames H Beichner MRN: [id number] Date of Birth: 02-24-1951  Subjective/Objective:                  Pt admitted from home with a fib. Pt lives alone and will return home at discharge. Pt has a neighbor who is very active in the care of the pt. Pt is active with [facility] RN, PT, Ot, and aide. Pt has a hospital bed, lift, rollator, walker, w/c for home use.  Action/Plan: Anticipate discharge over the weekend. Week[facility] staff [facility]  Expected Discharge Date:  02/25/15               Expected Discharge Plan:  Home w Home Health Services  In-House Referral:  NA  Discharge planning Services  CM Consult  Post Acute Care Choice:  Resumption of Svcs/PTA Provider Choice offered to:  Patient  DME Arranged:    DME Agency:     HH Arranged:  RN, PT, OT, Nurse's Aide HH Agency:  [facility]  Status of Service:  Completed, signed off  Medicare Important Message Given:  Yes Date Medicare IM Given:    Medicare IM give by:    Date Additional Medicare IM Given:    Additional Medicare Important Message give by:     If discussed at Long Length of Stay Meetings, dates discussed:    Additional Comments:  [name], Idil Maslanka Crowder, RN 02/24/2015, 4:09 PM

## 2015-02-24 NOTE — Progress Notes (Signed)
[facility] PROGRESS NOTE  Frederick Brown:[id number] DOB: Jul 01, 1950    PCP:   Trinna Post, MD   HPI: Frederick Brown is an 64 y.o. male admitted for afib with RVR during urological procedure. He was placed in the ICU anticipating the need for IV Diltiazem drip, and he was started on it last night. We discussed anticoagulation, and he will follow up with cardiology outpatient upon his discharge. HR remained controlled.   Rewiew of Systems:  Constitutional: Negative for malaise, fever and chills. No significant weight loss or weight gain Eyes: Negative for eye pain, redness and discharge, diplopia, visual changes, or flashes of light. ENMT: Negative for ear pain, hoarseness, nasal congestion, sinus pressure and sore throat. No headaches; tinnitus, drooling, or problem swallowing. Cardiovascular: Negative for chest pain, palpitations, diaphoresis, dyspnea and peripheral edema. ; No orthopnea, PND Respiratory: Negative for cough, hemoptysis, wheezing and stridor. No pleuritic chestpain. Gastrointestinal: Negative for nausea, vomiting, diarrhea, constipation, abdominal pain, melena, blood in stool, hematemesis, jaundice and rectal bleeding.    Genitourinary: Negative for frequency, dysuria, incontinence,flank pain and hematuria; Musculoskeletal: Negative for back pain and neck pain. Negative for swelling and trauma.;  Skin: . Negative for pruritus, rash, abrasions, bruising and skin lesion.; ulcerations Neuro: Negative for headache, lightheadedness and neck stiffness. Negative for weakness, altered level of consciousness , altered mental status, extremity weakness, burning feet, involuntary movement, seizure and syncope.  Psych: negative for anxiety, depression, insomnia, tearfulness, panic attacks, hallucinations, paranoia, suicidal or homicidal ideation    Past Medical History  Diagnosis Date  . Hypertension   . Obesity, morbid (more than 100 lbs over ideal weight or  BMI > 40) (HCC)   . Degenerative joint disease   . Alcohol abuse   . Umbilical hernia   . Gastroesophageal reflux disease   . Vitamin B12 deficiency 05/22/2011  . Fracture of metatarsal of left foot, closed 05/28/2011  . Fracture of great toe, left, closed 05/28/2011  . Chronic respiratory failure with hypoxia (HCC) 05/28/2011  . Urticarial rash 05/31/2011    From Cipro  . Cor pulmonale, chronic [facility]) February 2013  . Obesity hypoventilation syndrome [facility]) February 2013  . Pickwickian syndrome (HCC)   . CHF (congestive heart failure) (HCC)   . A-fib [facility])     Past Surgical History  Procedure Laterality Date  . Cholecystectomy    . Knee surgery    . Cystoscopy    . Cystoscopy with retrograde pyelogram, ureteroscopy and stent placement Bilateral 01/18/2015    Procedure: CYSTOSCOPY WITH BILATERAL RETROGRADE PYELOGRAM, AND BILATERAL STENT PLACEMENT;  Surgeon: Malen Gauze, MD;  Location: [facility]S;  Service: Urology;  Laterality: Bilateral;    Medications:  HOME MEDS: Prior to Admission medications   Medication Sig Start Date End Date Taking? Authorizing Provider  acetaminophen (TYLENOL) 500 MG tablet Take 1,000 mg by mouth every 6 (six) hours as needed for mild pain.   Yes Historical Provider, MD  allopurinol (ZYLOPRIM) 300 MG tablet Take 300 mg by mouth daily.   Yes Historical Provider, MD  aspirin EC 81 MG tablet Take 81 mg by mouth daily.   Yes Historical Provider, MD  COLCRYS 0.6 MG tablet Take 1 tablet by mouth every 12 (twelve) hours as needed (for gout).  01/16/15  Yes Historical Provider, MD  furosemide (LASIX) 40 MG tablet Take 40 mg by mouth daily.   Yes Historical Provider, MD  metolazone (ZAROXOLYN) 2.5 MG tablet Take 2.5 mg by mouth 2 (two) times daily.  Yes Historical Provider, MD  metoprolol tartrate (LOPRESSOR) 25 MG tablet Take 25 mg by mouth 2 (two) times daily.   Yes Historical Provider, MD  omeprazole (PRILOSEC) 20 MG capsule Take 20 mg by mouth every other day.     Yes Historical Provider, MD  oxyCODONE (OXY IR/ROXICODONE) 5 MG immediate release tablet Take 1 tablet (5 mg total) by mouth every 6 (six) hours as needed for moderate pain. 01/24/15  Yes Kimber RelicArthur G Green, MD  potassium chloride (MICRO-K) 10 MEQ CR capsule Take 20 mEq by mouth 2 (two) times daily.    Yes Historical Provider, MD  simvastatin (ZOCOR) 10 MG tablet Take 10 mg by mouth daily. 09/28/12  Yes Historical Provider, MD  tamsulosin (FLOMAX) 0.4 MG CAPS capsule Take 1 capsule (0.4 mg total) by mouth daily. 01/20/15  Yes Henderson CloudEstela Y Hernandez Acosta, MD  traZODone (DESYREL) 50 MG tablet Take 50 mg by mouth at bedtime.   Yes Historical Provider, MD  oxyCODONE-acetaminophen (PERCOCET/ROXICET) 5-325 MG tablet Take 1 tablet by mouth every 6 (six) hours as needed for moderate pain. Max 3gm APAP/24 hours from all sources Patient not taking: Reported on 02/20/2015 01/24/15   Kimber RelicArthur G Green, MD     Allergies:  Allergies  Allergen Reactions  . Ciprofloxacin Rash    Patient is not aware of the following allergy    Social History:   reports that he has never smoked. He does not have any smokeless tobacco history on file. He reports that he drinks about 0.6 oz of alcohol per week. He reports that he does not use illicit drugs.  Family History: Family History  Problem Relation Age of Onset  . Heart attack Father   . Heart failure Father   . Stroke Father   . Hypertension Father      Physical Exam: Filed Vitals:   02/24/15 0500 02/24/15 0600 02/24/15 0753 02/24/15 0927  BP: 123/65 97/60  103/85  Pulse: 80 81  89  Temp:   96.8 F (36 C)   TempSrc:   Axillary   Resp: 17 16    Height:      Weight: 145.9 kg (321 lb 10.4 oz)     SpO2: 90% 99%     Blood pressure 103/85, pulse 89, temperature 96.8 F (36 C), temperature source Axillary, resp. rate 16, height 5\' 10"  (1.778 m), weight 145.9 kg (321 lb 10.4 oz), SpO2 99 %.  GEN:  Pleasant patient lying in the stretcher in no acute distress;  cooperative with exam. PSYCH:  alert and oriented x4; does not appear anxious or depressed; affect is appropriate. HEENT: Mucous membranes pink and anicteric; PERRLA; EOM intact; no cervical lymphadenopathy nor thyromegaly or carotid bruit; no JVD; There were no stridor. Neck is very supple. Breasts:: Not examined CHEST WALL: No tenderness CHEST: Normal respiration, clear to auscultation bilaterally.  HEART: irregularly irregular.  There are no murmur, rub, or gallops.   BACK: No kyphosis or scoliosis; no CVA tenderness ABDOMEN: soft and non-tender; no masses, no organomegaly, normal abdominal bowel sounds; no pannus; no intertriginous candida. There is no rebound and no distention. Rectal Exam: Not done EXTREMITIES: No bone or joint deformity; age-appropriate arthropathy of the hands and knees; no edema; no ulcerations.  There is no calf tenderness. Genitalia: not examined PULSES: 2+ and symmetric SKIN: Normal hydration no rash or ulceration CNS: Cranial nerves 2-12 grossly intact no focal lateralizing neurologic deficit.  Speech is fluent; uvula elevated with phonation, facial symmetry and tongue midline. DTR  are normal bilaterally, cerebella exam is intact, barbinski is negative and strengths are equaled bilaterally.  No sensory loss.   Labs on Admission:  Basic Metabolic Panel:  Recent Labs Lab 02/20/15 1015 02/22/15 1604 02/23/15 0438  NA 139 136 136  K 3.1* 3.2* 3.7  CL 98* 96* 100*  CO2 GLUCOSE 109* 121* 107*  BUN 24* 28* 31*  CREATININE 0.91 1.09 1.03  CALCIUM 10.9* 10.2 9.7   CBC:  Recent Labs Lab 02/20/15 1015 02/22/15 1604 02/23/15 0438  WBC 10.7* 9.9 6.9  NEUTROABS 9.5*  --   --   HGB 13.8 13.1 12.1*  HCT 43.1 39.6 37.1*  MCV 90.4 89.0 88.8  PLT 130* 124* 136*   Cardiac Enzymes:  Recent Labs Lab 02/22/15 1604 02/22/15 2205 02/23/15 0438  TROPONINI <0.03 <0.03 <0.03    Radiological Exams on Admission: Dg Retrograde Pyelogram  02/22/2015   CLINICAL DATA:  64 year old male with a history of retrograde pyelogram and nephrolithiasis. EXAM: RETROGRADE PYELOGRAM COMPARISON:  01/18/2015, prior CT 01/17/2015 FINDINGS: Multiple intraoperative fluoroscopic spot images during retrograde pyelogram. Initial image demonstrates scope and safety wire projecting over the proximal left collecting system. Subsequently there is partial opacification of the proximal left ureter and the left collecting system. There appears to be mild pelvicaliectasis, however, the left system is not entirely imaged. Evidence of left-sided ureteral stent placement. Subsequently there is a safety wire in the right collecting system with passage of a stent into the right collecting system. Partial opacification of the right collecting system demonstrating mild pelvicaliectasis. IMPRESSION: Images during bilateral retrograde pyelogram demonstrates placement of bilateral ureteral stents as well as evidence of mild bilateral pelvicaliectasis. Please refer to the dictated operative report for full details of intraoperative findings and procedure. Signed, Yvone Neu. Loreta Ave, DO [facility] Radiology Electronically Signed   By: Gilmer Mor D.O.   On: 02/22/2015 14:25    EKG: Independently reviewed.    Assessment/Plan Present on Admission:  . Ureteral calculi . Atrial fibrillation with RVR (HCC) . Alcohol abuse . AKI (acute kidney injury) (HCC) . Essential hypertension . Morbid obesity (HCC) . Urinary tract infection, site not specified . Atrial fibrillation (HCC)  PLAN:  Afib with RVR: D/C Cardiazem drip today.  Transfer to floor. Continue his Lopressor to  BID. TSH and troponins were OK. Anticoagulation consideration in follow up, due to previous non compliant with Dr Adair Patter. CHADS 2. Continue with ASA for now. Plan to d/c home tomorrow.   Nephrolithiasis: Needs antibiotics for 14 days total since admission per  urology.  HypoK: Repleted.   Hx of alcohol abuse: No evidence of withdrawal. On CIWA with PRN Ativan.   Other plans as per orders.  Code Status: FULL Unk Lightning, MD. [facility] Pager [phone] 7pm to 7am.  02/24/2015, 9:35 AM

## 2015-02-24 NOTE — Evaluation (Signed)
Physical Therapy Evaluation Patient Details Name: Frederick Brown MRN: [id number] DOB: [birth year] Today's Date: 02/24/2015   History of Present Illness  Frederick Brown is an 64 y.o. male with hx of neprholithiasis, admitted for sespsis due to UTI a month ago, s/p bilateral ureteral stent, hx of chroninc afib, not on anticoagulation (reviewed his record: Dr Leander Rams was his cardiologist, non compliance, so wasn't put on coumadin in 2013. If compliance, to be considered), was to have stent exchanged and stone crushed today, but was not taken his betablocker, went into afib with RVR, so I was asked to admit him for further evaluation and Tx. He is currently rate controlled, and offered no complaint in the recovery room. He is alert and is in no apparent distress. He has normal Hb and normal renal fx tests. His K is 3.1 mEq/L.   Clinical Impression  Pt is well known to me from previous admissions.  He was recently at [facility] for 1 month to get rehab for significant weakness/deconditioning.  He had been bed bound since May 2016 following surgery for removal of abdominal pannus.  He lives alone and had neighbors who help out.  Pt states that he got very minimal work on strengthening his LEs at [facility] and made no functional progress.  Therefore, he still is unable to even transfer from bed to w/c.  Somehow he has been managing at home.  We have tried in the past to get him into ACLF but have not been successful.  I am recommending HHPT at d/c.  He has all needed DME.    Follow Up Recommendations Home health PT    Equipment Recommendations  None recommended by PT    Recommendations for Other Services   OT    Precautions / Restrictions Precautions Precautions: Fall Restrictions Weight Bearing Restrictions: No      Mobility  Bed Mobility               General bed mobility comments: unable to mobilize due to multiple ICU lines, mobid obesity  Transfers                 General  transfer comment: pt would need full lift for transfer bed to chair  Ambulation/Gait                Stairs            Wheelchair Mobility    Modified Rankin (Stroke Patients Only)       Balance Overall balance assessment: No apparent balance deficits (not formally assessed)                                           Pertinent Vitals/Pain Pain Assessment: No/denies pain    Home Living Family/patient expects to be discharged to:: Private residence Living Arrangements: Alone Available Help at Discharge: Friend(s);Home health;Available PRN/intermittently Type of Home: House Home Access: Stairs to enter Entrance Stairs-Rails: None Entrance Stairs-Number of Steps: 1 Home Layout: One level Home Equipment: Walker - 2 wheels;Wheelchair - [employer] - 4 wheels      Prior Function Level of Independence: Needs assistance   Gait / Transfers Assistance Needed: bed bound since May 2016, unable to transfer  ADL's / Homemaking Assistance Needed: Pt has a neighbor who brings his meals. A nurse comes in to bathe and assist in toileting hygiene when pt calls her.  Hand Dominance        Extremity/Trunk Assessment   Upper Extremity Assessment: Generalized weakness           Lower Extremity Assessment: RLE deficits/detail RLE Deficits / Details: strength of anterior tibialis only 1/5       Communication   Communication: No difficulties  Cognition Arousal/Alertness: Awake/alert Behavior During Therapy: WFL for tasks assessed/performed Overall Cognitive Status: Within Functional Limits for tasks assessed                      General Comments      Exercises General Exercises - Lower Extremity Ankle Circles/Pumps: AROM;Strengthening;Both;10 reps;Supine Quad Sets: AROM;Both;10 reps;Supine Gluteal Sets: AROM;Both;10 reps;Supine Short Arc Quad: AROM;Both;10 reps;Supine Heel Slides: AROM;Strengthening;Both;10 reps;Supine Hip  ABduction/ADduction: AROM;Strengthening;Both;10 reps;Supine      Assessment/Plan    PT Assessment All further PT needs can be met in the next venue of care  PT Diagnosis Generalized weakness (unable to transfer or walk)   PT Problem List Decreased strength;Decreased activity tolerance;Decreased mobility;Cardiopulmonary status limiting activity;Obesity  PT Treatment Interventions     PT Goals (Current goals can be found in the Care Plan section) Acute Rehab PT Goals PT Goal Formulation: All assessment and education complete, DC therapy    Frequency     Barriers to discharge        Co-evaluation               End of Session   Activity Tolerance: Patient tolerated treatment well Patient left: in bed;with call bell/phone within reach           Time: 1140-1218 PT Time Calculation (min) (ACUTE ONLY): 38 min   Charges:   PT Evaluation $Initial PT Evaluation Tier I: 1 Procedure PT Treatments $Therapeutic Exercise: 8-22 mins   PT G CodesSable Feil  PT 02/24/2015, 12:28 PM [phone]

## 2015-02-24 NOTE — Progress Notes (Signed)
Report given to [facility] patient transferred to 301 via bed. No distress noted.

## 2015-02-25 DIAGNOSIS — N390 Urinary tract infection, site not specified: Secondary | ICD-10-CM

## 2015-02-25 DIAGNOSIS — N201 Calculus of ureter: Secondary | ICD-10-CM

## 2015-02-25 MED ORDER — METOPROLOL TARTRATE 25 MG PO TABS: 50.0000 mg | ORAL_TABLET | Freq: Two times a day (BID) | ORAL | Status: DC

## 2015-02-25 MED ORDER — CEPHALEXIN 500 MG PO CAPS: 500.0000 mg | ORAL_CAPSULE | Freq: Three times a day (TID) | ORAL | Status: DC

## 2015-02-25 NOTE — Progress Notes (Signed)
Patient alert and oriented, independent, VSS, pt. Tolerating diet well. No complaints of pain or nausea. Pt. Had IV removed tip intact. Pt. Had prescriptions given. Pt. Voiced understanding of discharge instructions with no further questions. Pt. Discharged to home via EMS.

## 2015-02-25 NOTE — Discharge Summary (Signed)
Physician Discharge Summary  Frederick Brown ZOX:[id number] DOB: November 05, 1950 DOA: 02/22/2015  PCP: Trinna PostKOBERLEIN, JUNELL CAROL, MD  Admit date: 02/22/2015 Discharge date: 02/25/2015  Time spent: 35 minutes  Recommendations for Outpatient Follow-up:  1. Follow up with Dr Wilkie AyePatrick McKenzie in 2 weeks.  2. Follow up with PCP as previously recommended.  3. Follow up with Cardiology for afib and consideration for anticoagulation.    Discharge Diagnoses:  Principal Problem:   Atrial fibrillation with RVR (HCC) Active Problems:   Atrial fibrillation (HCC)   Alcohol abuse   AKI (acute kidney injury) (HCC)   Urinary tract infection, site not specified   Morbid obesity (HCC)   Essential hypertension   Ureteral calculi   Discharge Condition:  Improved.   Diet recommendation: as tolerated.   Filed Weights   02/22/15 1540 02/23/15 0500 02/24/15 0500  Weight: 146.2 kg (322 lb 5 oz) 146.8 kg (323 lb 10.2 oz) 145.9 kg (321 lb 10.4 oz)    History of present illness: Patient was admitted by me for afib with RVR from the recovery at the request of his urologist Dr Wilkie AyePatrick McKenzie on Feb 22, 2015.  As per my prior H and P:  " Frederick CanterburyJames H Kea is an 64 y.o. male with hx of neprholithiasis, admitted for sespsis due to UTI a month ago, s/p bilateral ureteral stent, hx of chroninc afib, not on anticoagulation (reviewed his record: Dr Crosby Oysterothbard was his cardiologist, non compliance, so wasn't put on coumadin in 2013. If compliance, to be considered), was to have stent exchanged and stone crushed today, but was not taken his betablocker, went into afib with RVR, so I was asked to admit him for further evaluation and Tx. He is currently rate controlled, and offered no complaint in the recovery room. He is alert and is in no apparent distress. He has normal Hb and normal renal fx tests. His K is 3.1 mEq/L.    Hospital Course:  Patient was admitted into the ICU, anticipating that he may eventually needs  the Diltiazem drip, and he did that night.  The drip controlled his rate.  His Betablocker was restarted, and increase to Lopressor 50mg  BID.  The drip was subsequently discontinued for following day, and he was transferred to the floor.  His rate remained stable.  He did not have any hematuria from the procedure.  Dr Ronne BinningMcKenzie wanted him to have an antibiotic for at least 2 weeks, and since he was allergic to Cipro, Rocephin was used.   He will be discharged on Keflex 500mg  TID for another 10days, and follow up with his urologist.  With respect to his afib, record showed that in 2013, when he saw Dr Crosby Oysterothbard, discussion about anticoagulation was started, but he was not given, as he was found to be non compliant.  He has a CHADS score of 2, and would be a candidate for anticoagulation.  I spoke with him about it, and recommended that he sees his current cardiologist for this consideration.   He is now stable for discharge.  Thank you and Good day.    Procedures:  None.   Discharge Exam: Filed Vitals:   02/25/15 0432 02/25/15 0836  BP: 97/60 91/52  Pulse: 79 96  Temp: 97.6 F (36.4 C) 97.5 F (36.4 C)  Resp: 16 18    Discharge Instructions   Discharge Instructions    Diet - low sodium heart healthy    Complete by:  As directed      Discharge  instructions    Complete by:  As directed   Take your antibiotics as prescribed.  See your urologist in follow up in 2 weeks.     Increase activity slowly    Complete by:  As directed           Current Discharge Medication List    START taking these medications   Details  cephALEXin (KEFLEX) 500 MG capsule Take 1 capsule (500 mg total) by mouth 3 (three) times daily. Qty: 30 capsule, Refills: 0      CONTINUE these medications which have CHANGED   Details  metoprolol tartrate (LOPRESSOR) 25 MG tablet Take 2 tablets (50 mg total) by mouth 2 (two) times daily. Qty: 60 tablet, Refills: 2      CONTINUE these medications which have NOT  CHANGED   Details  acetaminophen (TYLENOL) 500 MG tablet Take 1,000 mg by mouth every 6 (six) hours as needed for mild pain.    allopurinol (ZYLOPRIM) 300 MG tablet Take 300 mg by mouth daily.    aspirin EC 81 MG tablet Take 81 mg by mouth daily.    furosemide (LASIX) 40 MG tablet Take 40 mg by mouth daily.    metolazone (ZAROXOLYN) 2.5 MG tablet Take 2.5 mg by mouth 2 (two) times daily.     omeprazole (PRILOSEC) 20 MG capsule Take 20 mg by mouth every other day.     oxyCODONE (OXY IR/ROXICODONE) 5 MG immediate release tablet Take 1 tablet (5 mg total) by mouth every 6 (six) hours as needed for moderate pain. Qty: 120 tablet, Refills: 0    potassium chloride (MICRO-K) 10 MEQ CR capsule Take 20 mEq by mouth 2 (two) times daily.     simvastatin (ZOCOR) 10 MG tablet Take 10 mg by mouth daily.    tamsulosin (FLOMAX) 0.4 MG CAPS capsule Take 1 capsule (0.4 mg total) by mouth daily. Qty: 30 capsule, Refills: 2    traZODone (DESYREL) 50 MG tablet Take 50 mg by mouth at bedtime.      STOP taking these medications     COLCRYS 0.6 MG tablet      oxyCODONE-acetaminophen (PERCOCET/ROXICET) 5-325 MG tablet        Allergies  Allergen Reactions  . Ciprofloxacin Rash    Patient is not aware of the following allergy   Follow-up Information    Follow up with [facility].   Contact information:   [address] [place] [address] [phone]        The results of significant diagnostics from this hospitalization (including imaging, microbiology, ancillary and laboratory) are listed below for reference.    Significant Diagnostic Studies: Dg Abd 1 View  02/16/2015  CLINICAL DATA:  Generalized abdominal pain, constipation EXAM: ABDOMEN - 1 VIEW COMPARISON:  CT abdomen pelvis dated 01/17/2015 FINDINGS: Bilateral double pigtail ureteral stents in satisfactory position. Cholecystectomy clips. Nonobstructive bowel gas pattern. Degenerative changes of the  visualized thoracolumbar spine. IMPRESSION: No evidence of bowel obstruction. Bilateral double-pigtail ureteral stents. Electronically Signed   By: Charline Bills M.D.   On: 02/16/2015 15:37   Dg Retrograde Pyelogram  02/22/2015  CLINICAL DATA:  64 year old male with a history of retrograde pyelogram and nephrolithiasis. EXAM: RETROGRADE PYELOGRAM COMPARISON:  01/18/2015, prior CT 01/17/2015 FINDINGS: Multiple intraoperative fluoroscopic spot images during retrograde pyelogram. Initial image demonstrates scope and safety wire projecting over the proximal left collecting system. Subsequently there is partial opacification of the proximal left ureter and the left collecting system. There appears  to be mild pelvicaliectasis, however, the left system is not entirely imaged. Evidence of left-sided ureteral stent placement. Subsequently there is a safety wire in the right collecting system with passage of a stent into the right collecting system. Partial opacification of the right collecting system demonstrating mild pelvicaliectasis. IMPRESSION: Images during bilateral retrograde pyelogram demonstrates placement of bilateral ureteral stents as well as evidence of mild bilateral pelvicaliectasis. Please refer to the dictated operative report for full details of intraoperative findings and procedure. Signed, Yvone Neu. Loreta Ave, DO [facility] Electronically Signed   By: Gilmer Mor D.O.   On: 02/22/2015 14:25    Microbiology: No results found for this or any previous visit (from the past 240 hour(s)).   Labs: Basic Metabolic Panel:  Recent Labs Lab 02/20/15 1015 02/22/15 1604 02/23/15 0438  NA 139 136 136  K 3.1* 3.2* 3.7  CL 98* 96* 100*  CO2 GLUCOSE 109* 121* 107*  BUN 24* 28* 31*  CREATININE 0.91 1.09 1.03  CALCIUM 10.9* 10.2 9.7   CBC:  Recent Labs Lab 02/20/15 1015 02/22/15 1604 02/23/15 0438  WBC 10.7* 9.9 6.9   NEUTROABS 9.5*  --   --   HGB 13.8 13.1 12.1*  HCT 43.1 39.6 37.1*  MCV 90.4 89.0 88.8  PLT 130* 124* 136*   Cardiac Enzymes:  Recent Labs Lab 02/22/15 1604 02/22/15 2205 02/23/15 0438  TROPONINI <0.03 <0.03 <0.03   Signed:  Ellisha Bankson  [facility] 02/25/2015, 12:26 PM

## 2015-02-27 ENCOUNTER — Encounter (HOSPITAL_COMMUNITY): Payer: Self-pay | Admitting: Urology

## 2015-03-01 LAB — STONE ANALYSIS
CA OXALATE, MONOHYDR.: 10 %
CA PHOS CRY STONE QL IR: 90 %
STONE WEIGHT KSTONE: 11.2 mg

## 2015-03-15 ENCOUNTER — Other Ambulatory Visit: Payer: Self-pay | Admitting: Urology

## 2015-03-15 ENCOUNTER — Ambulatory Visit (INDEPENDENT_AMBULATORY_CARE_PROVIDER_SITE_OTHER): Payer: Medicare Other | Admitting: Urology

## 2015-03-15 DIAGNOSIS — N200 Calculus of kidney: Secondary | ICD-10-CM

## 2015-06-12 ENCOUNTER — Ambulatory Visit (HOSPITAL_COMMUNITY): Admission: RE | Admit: 2015-06-12 | Payer: Medicare Other | Source: Ambulatory Visit

## 2015-09-04 DIAGNOSIS — E876 Hypokalemia: Secondary | ICD-10-CM | POA: Diagnosis not present

## 2015-09-04 DIAGNOSIS — G629 Polyneuropathy, unspecified: Secondary | ICD-10-CM | POA: Diagnosis not present

## 2015-09-04 DIAGNOSIS — M6281 Muscle weakness (generalized): Secondary | ICD-10-CM | POA: Diagnosis not present

## 2015-09-04 DIAGNOSIS — Z681 Body mass index (BMI) 19 or less, adult: Secondary | ICD-10-CM | POA: Diagnosis not present

## 2015-09-04 DIAGNOSIS — Z1389 Encounter for screening for other disorder: Secondary | ICD-10-CM | POA: Diagnosis not present

## 2015-09-06 DIAGNOSIS — Z1389 Encounter for screening for other disorder: Secondary | ICD-10-CM | POA: Diagnosis not present

## 2015-09-06 DIAGNOSIS — R748 Abnormal levels of other serum enzymes: Secondary | ICD-10-CM | POA: Diagnosis not present

## 2015-09-06 DIAGNOSIS — E876 Hypokalemia: Secondary | ICD-10-CM | POA: Diagnosis not present

## 2015-11-08 ENCOUNTER — Ambulatory Visit (HOSPITAL_COMMUNITY)
Admission: RE | Admit: 2015-11-08 | Discharge: 2015-11-08 | Disposition: A | Discharge status: Home / Self Care | Payer: Medicare Other | Source: Ambulatory Visit | Attending: Family Medicine | Admitting: Family Medicine

## 2015-11-08 ENCOUNTER — Other Ambulatory Visit (HOSPITAL_COMMUNITY): Payer: Self-pay | Admitting: Family Medicine

## 2015-11-08 DIAGNOSIS — R600 Localized edema: Secondary | ICD-10-CM

## 2015-11-08 DIAGNOSIS — M7989 Other specified soft tissue disorders: Secondary | ICD-10-CM | POA: Insufficient documentation

## 2015-11-08 DIAGNOSIS — Z6841 Body Mass Index (BMI) 40.0 and over, adult: Secondary | ICD-10-CM | POA: Diagnosis not present

## 2015-11-15 DIAGNOSIS — H40013 Open angle with borderline findings, low risk, bilateral: Secondary | ICD-10-CM | POA: Diagnosis not present

## 2015-11-15 DIAGNOSIS — H40001 Preglaucoma, unspecified, right eye: Secondary | ICD-10-CM | POA: Diagnosis not present

## 2015-11-15 DIAGNOSIS — H35031 Hypertensive retinopathy, right eye: Secondary | ICD-10-CM | POA: Diagnosis not present

## 2015-11-15 DIAGNOSIS — D3131 Benign neoplasm of right choroid: Secondary | ICD-10-CM | POA: Diagnosis not present

## 2015-11-15 DIAGNOSIS — H524 Presbyopia: Secondary | ICD-10-CM | POA: Diagnosis not present

## 2015-11-15 DIAGNOSIS — D3132 Benign neoplasm of left choroid: Secondary | ICD-10-CM | POA: Diagnosis not present

## 2015-11-15 DIAGNOSIS — H40002 Preglaucoma, unspecified, left eye: Secondary | ICD-10-CM | POA: Diagnosis not present

## 2015-11-15 DIAGNOSIS — D3130 Benign neoplasm of unspecified choroid: Secondary | ICD-10-CM | POA: Diagnosis not present

## 2015-11-15 DIAGNOSIS — H35032 Hypertensive retinopathy, left eye: Secondary | ICD-10-CM | POA: Diagnosis not present

## 2015-11-15 DIAGNOSIS — I10 Essential (primary) hypertension: Secondary | ICD-10-CM | POA: Diagnosis not present

## 2015-12-20 DIAGNOSIS — H40013 Open angle with borderline findings, low risk, bilateral: Secondary | ICD-10-CM | POA: Diagnosis not present

## 2016-08-27 IMAGING — US US EXTREM LOW VENOUS*L*
1 series · 13 of 24 positions shown · non-contrast
Comparison: None.

CLINICAL DATA: Left lower extremity edema for 1 month.



[Series 1: us extrem low venous*left* · 0.09mm/px · 13 of 32 slices shown]
[im 1/32]
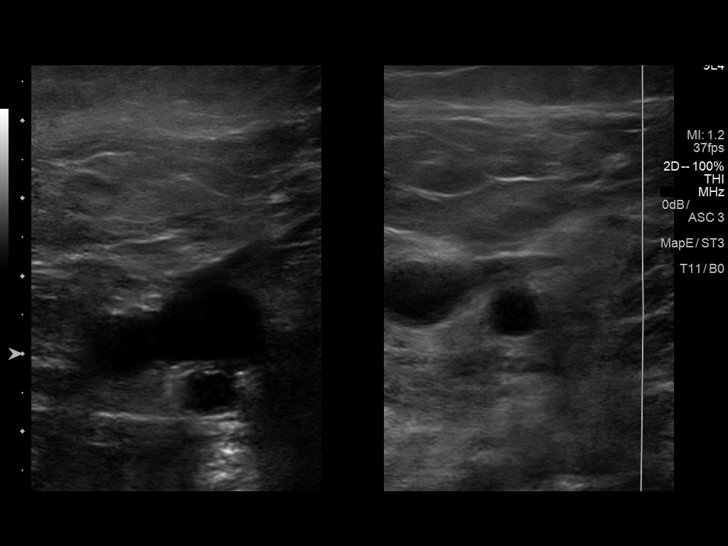
[im 3/32]
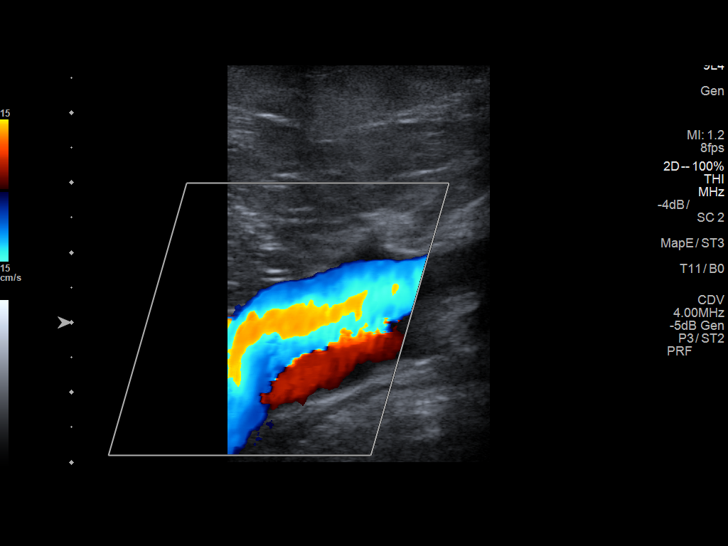
[im 6/32]
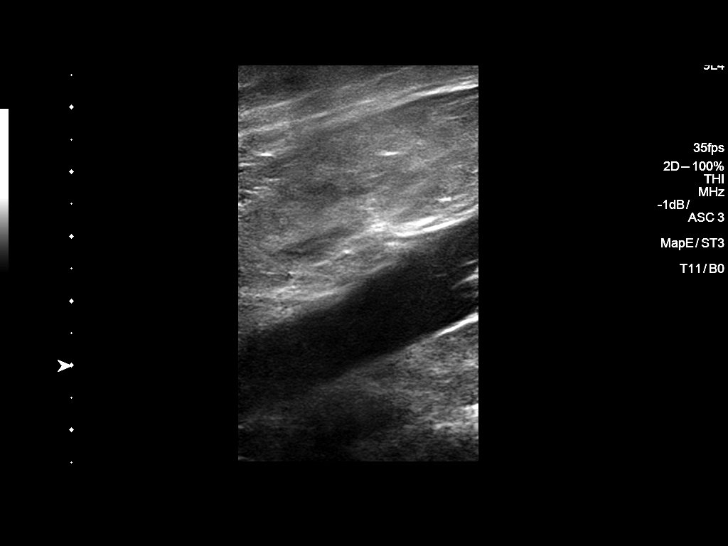
[im 9/32]
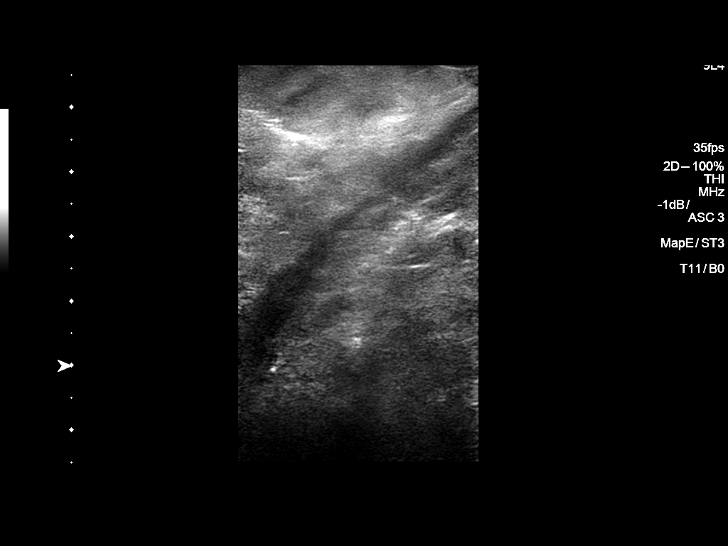
[im 11/32]
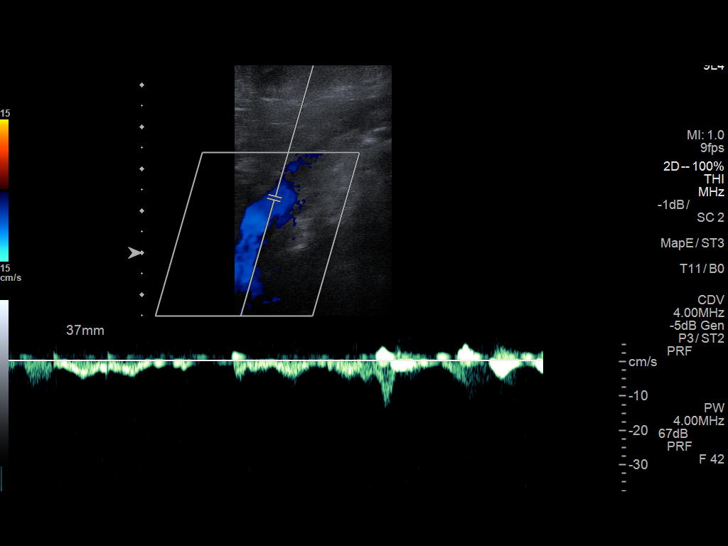
[im 14/32]
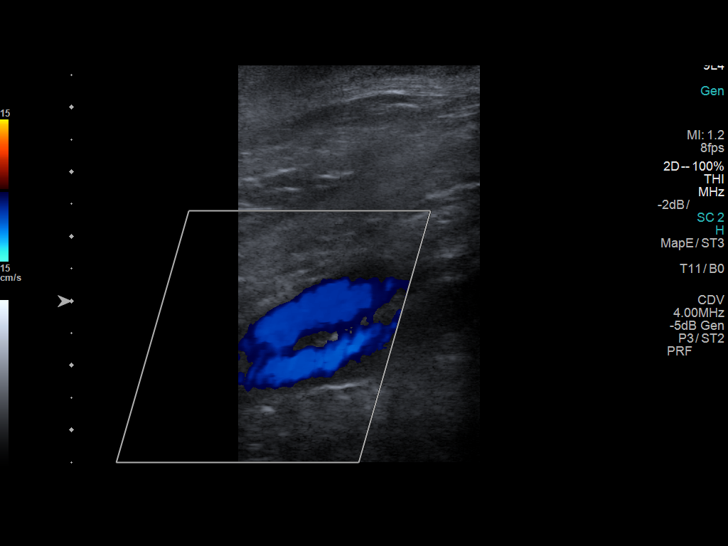
[im 17/32]
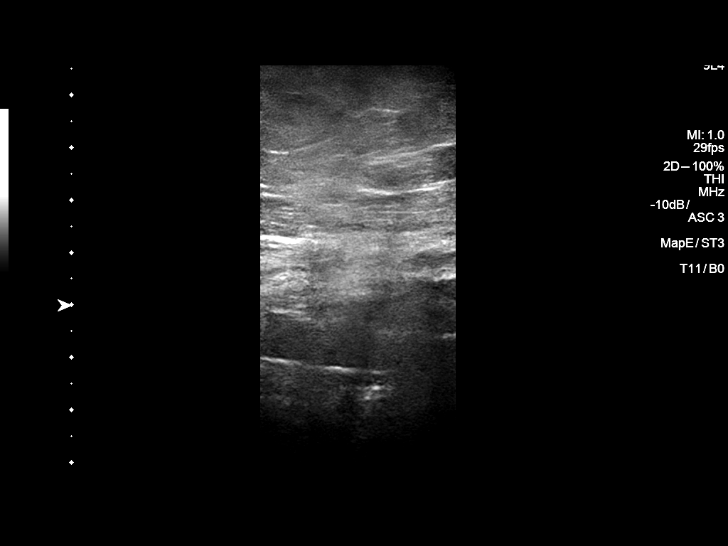
[im 18/32]
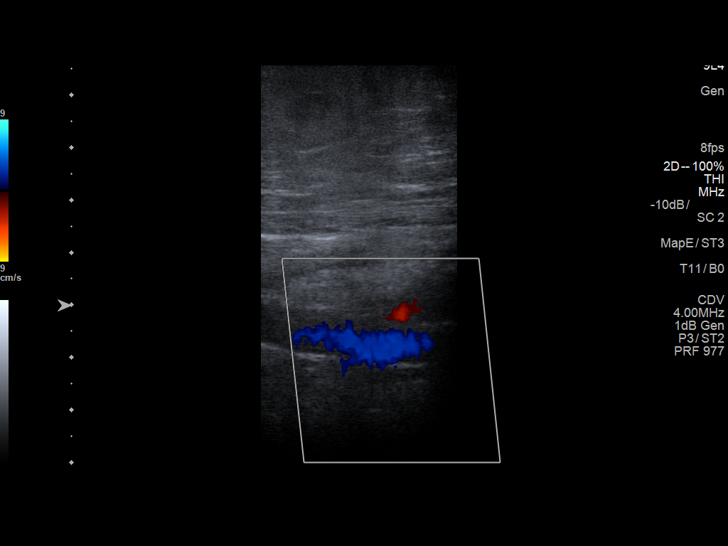
[im 21/32]
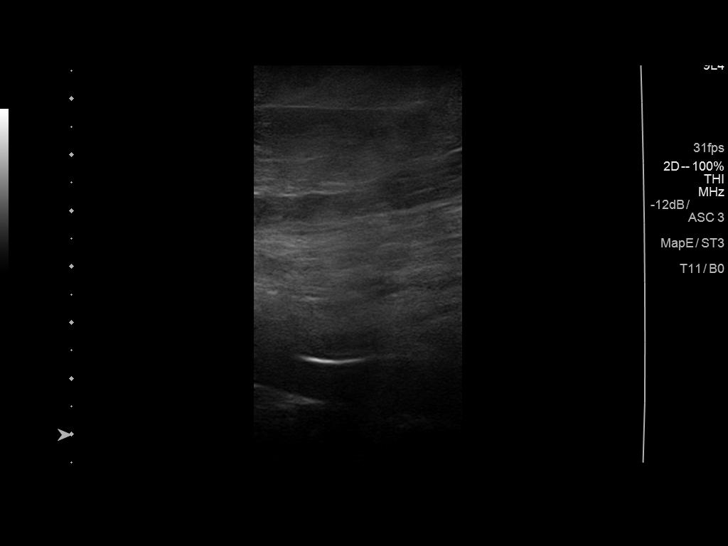
[im 23/32]
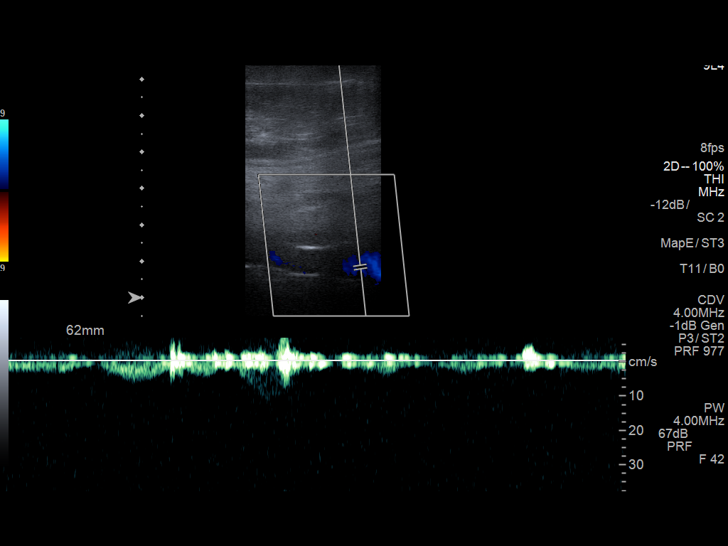
[im 26/32]
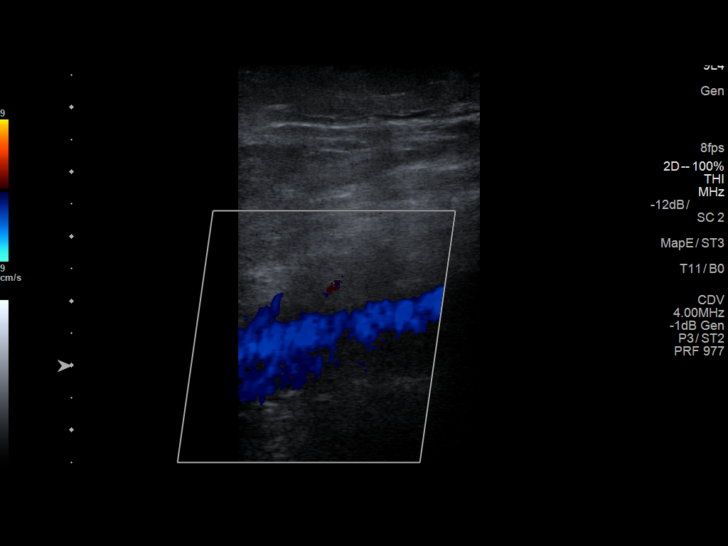
[im 29/32]
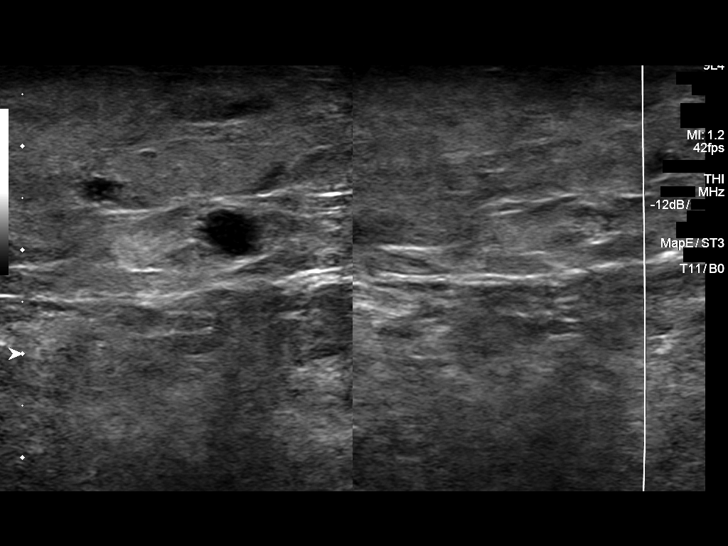
[im 32/32]
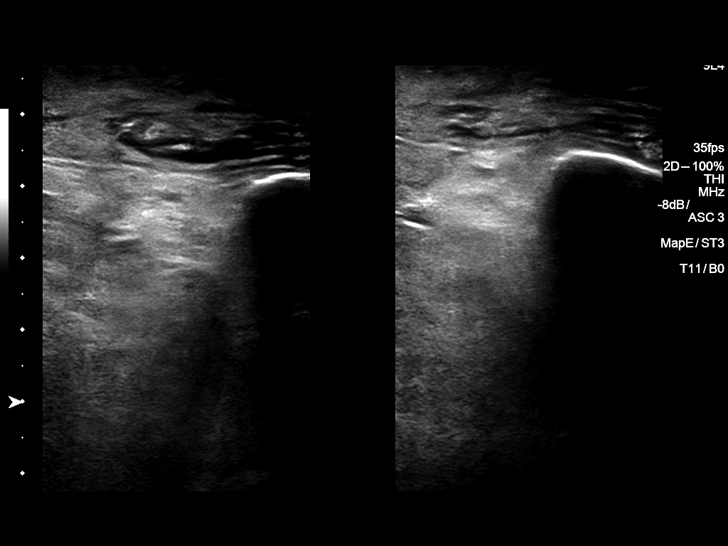

[13 of 24 positions shown; findings below may reference images not displayed]

FINDINGS: Contralateral Common Femoral Vein: Respiratory phasicity is normal
and symmetric with the symptomatic side. No evidence of thrombus.
Normal compressibility.

Common Femoral Vein: No evidence of thrombus. Normal
compressibility, respiratory phasicity and response to augmentation.

Saphenofemoral Junction: No evidence of thrombus. Normal
compressibility and flow on color Doppler imaging.

Profunda Femoral Vein: No evidence of thrombus. Normal
compressibility and flow on color Doppler imaging.

Femoral Vein: No evidence of thrombus. Normal compressibility,
respiratory phasicity and response to augmentation.

Popliteal Vein: No evidence of thrombus. Normal compressibility,
respiratory phasicity and response to augmentation.

Calf Veins: No evidence of thrombus. Normal compressibility and flow
on color Doppler imaging.

Superficial Great Saphenous Vein: No evidence of thrombus. Normal
compressibility and flow on color Doppler imaging.

Venous Reflux:  None.

Other Findings:  None.
IMPRESSION: No evidence of deep venous thrombosis is seen in left lower
extremity.

## 2016-12-11 DIAGNOSIS — Z6841 Body Mass Index (BMI) 40.0 and over, adult: Secondary | ICD-10-CM | POA: Diagnosis not present

## 2016-12-11 DIAGNOSIS — I509 Heart failure, unspecified: Secondary | ICD-10-CM | POA: Diagnosis not present

## 2016-12-11 DIAGNOSIS — G894 Chronic pain syndrome: Secondary | ICD-10-CM | POA: Diagnosis not present

## 2016-12-11 DIAGNOSIS — Z1389 Encounter for screening for other disorder: Secondary | ICD-10-CM | POA: Diagnosis not present

## 2016-12-11 DIAGNOSIS — I10 Essential (primary) hypertension: Secondary | ICD-10-CM | POA: Diagnosis not present

## 2016-12-11 DIAGNOSIS — G629 Polyneuropathy, unspecified: Secondary | ICD-10-CM | POA: Diagnosis not present

## 2016-12-11 DIAGNOSIS — Z0001 Encounter for general adult medical examination with abnormal findings: Secondary | ICD-10-CM | POA: Diagnosis not present

## 2016-12-31 ENCOUNTER — Encounter: Payer: Self-pay | Admitting: Cardiovascular Disease

## 2016-12-31 ENCOUNTER — Ambulatory Visit (INDEPENDENT_AMBULATORY_CARE_PROVIDER_SITE_OTHER): Payer: Medicare Other | Admitting: Cardiovascular Disease

## 2016-12-31 VITALS — BP 134/82 | HR 77 | Wt 339.0 lb

## 2016-12-31 DIAGNOSIS — I10 Essential (primary) hypertension: Secondary | ICD-10-CM | POA: Diagnosis not present

## 2016-12-31 DIAGNOSIS — I4891 Unspecified atrial fibrillation: Secondary | ICD-10-CM

## 2016-12-31 DIAGNOSIS — I509 Heart failure, unspecified: Secondary | ICD-10-CM

## 2016-12-31 DIAGNOSIS — G4733 Obstructive sleep apnea (adult) (pediatric): Secondary | ICD-10-CM

## 2016-12-31 DIAGNOSIS — Z7189 Other specified counseling: Secondary | ICD-10-CM

## 2016-12-31 MED ORDER — RIVAROXABAN 20 MG PO TABS: 20.0000 mg | ORAL_TABLET | Freq: Every day | ORAL | 3 refills | Status: DC

## 2016-12-31 NOTE — Progress Notes (Signed)
CARDIOLOGY CONSULT NOTE  Patient ID: Frederick Brown MRN: [id number] DOB/AGE: 1950-08-22 66 y.o.  Admit date: (Not on file) Primary Physician: Assunta Found, MD Referring Physician: Dr. Phillips Odor  Reason for Consultation: atrial fibrillation, CHF  HPI: Frederick Brown is a 66 y.o. male who is being seen today for the evaluation of atrial fibrillation and congestive heart failure at the request of Assunta Found, MD.   As per review of the electronic medical record, he has a history of atrial fibrillation dating back several years. When he was hospitalized in 01/2015, he was instructed to follow up with cardiology but never did. He has a history of medication noncompliance and was not put on anticoagulation for this reason.  Echocardiogram on 05/22/11 showed normal LV systolic function, EF 55-60%, with mild LVH, moderate biatrial enlargement, moderate RV dilatation with mildly decreased systolic function, and mild TR.  A review of his medications shows that he is taking furosemide and metolazone as well as metoprolol and simvastatin along with aspirin.  I personally reviewed office notes, labs, and documentation.  Labs 12/11/16: White blood cells 7, hemoglobin 14.5, platelet 168, BUN 25, creatinine 1.16, calcium 11, total cholesterol 203, trig glycerides 114, HDL 63, LDL 117, normal TSH, normal HbA1c of 5.5%.  ECG performed in the office today which I ordered an personally interpreted demonstrated atrial fibrillation, 77 bpm, possible old inferior infarct, and late R-wave transition.  He is morbidly obese.  He underwent panniculectomy in 2016. He has lost 160 pounds. This was performed at [facility].  He denies chest pain. He has significant lower extremity neuropathy bilaterally. He has no worsening of baseline exertional dyspnea. He said he does not get much by way of physical activity. He denies orthopnea and paroxysmal nocturnal dyspnea. He said he was diagnosed with  severe sleep apnea 2 years ago but did not tolerate CPAP.    Allergies  Allergen Reactions  . Ciprofloxacin Rash    Patient is not aware of the following allergy    Current Outpatient Prescriptions  Medication Sig Dispense Refill  . acetaminophen (TYLENOL) 500 MG tablet Take 1,000 mg by mouth every 6 (six) hours as needed for mild pain.    Marland Kitchen allopurinol (ZYLOPRIM) 300 MG tablet Take 300 mg by mouth daily.    Marland Kitchen aspirin EC 81 MG tablet Take 81 mg by mouth daily.    . furosemide (LASIX) 40 MG tablet Take 40 mg by mouth daily.    . metolazone (ZAROXOLYN) 2.5 MG tablet Take 2.5 mg by mouth 2 (two) times daily.     . metoprolol tartrate (LOPRESSOR) 25 MG tablet Take 2 tablets (50 mg total) by mouth 2 (two) times daily. 60 tablet 2  . omeprazole (PRILOSEC) 20 MG capsule Take 20 mg by mouth every other day.     . simvastatin (ZOCOR) 10 MG tablet Take 10 mg by mouth daily.     No current facility-administered medications for this visit.     Past Medical History:  Diagnosis Date  . A-fib (HCC)   . Alcohol abuse   . CHF (congestive heart failure) (HCC)   . Chronic respiratory failure with hypoxia (HCC) 05/28/2011  . Cor pulmonale, chronic [facility]) February 2013  . Degenerative joint disease   . Fracture of great toe, left, closed 05/28/2011  . Fracture of metatarsal of left foot, closed 05/28/2011  . Gastroesophageal reflux disease   . Hypertension   . Obesity hypoventilation syndrome [facility]) February 2013  .  Obesity, morbid (more than 100 lbs over ideal weight or BMI > 40) (HCC)   . Pickwickian syndrome (HCC)   . Umbilical hernia   . Urticarial rash 05/31/2011   From Cipro  . Vitamin B12 deficiency 05/22/2011    Past Surgical History:  Procedure Laterality Date  . CHOLECYSTECTOMY    . CYSTOSCOPY    . CYSTOSCOPY W/ URETERAL STENT PLACEMENT Bilateral 02/22/2015   Procedure: CYSTOSCOPY WITH BILATERAL STENT EXCHANGE;  Surgeon: Malen Gauze, MD;  Location: [facility];  Service: Urology;   Laterality: Bilateral;  . CYSTOSCOPY WITH HOLMIUM LASER LITHOTRIPSY Bilateral 02/22/2015   Procedure: CYSTOSCOPY WITH HOLMIUM LASER BILATERAL LITHOTRIPSY;  Surgeon: Malen Gauze, MD;  Location: [facility];  Service: Urology;  Laterality: Bilateral;  . CYSTOSCOPY WITH RETROGRADE PYELOGRAM, URETEROSCOPY AND STENT PLACEMENT Bilateral 01/18/2015   Procedure: CYSTOSCOPY WITH BILATERAL RETROGRADE PYELOGRAM, AND BILATERAL STENT PLACEMENT;  Surgeon: Malen Gauze, MD;  Location: [facility];  Service: Urology;  Laterality: Bilateral;  . CYSTOSCOPY/RETROGRADE/URETEROSCOPY/STONE EXTRACTION WITH BASKET Bilateral 02/22/2015   Procedure: CYSTOSCOPY/BILATERAL RETROGRADE/BILATERAL URETEROSCOPY/BILATERAL STONE EXTRACTION WITH BASKET;  Surgeon: Malen Gauze, MD;  Location: [facility];  Service: Urology;  Laterality: Bilateral;  . KNEE SURGERY      Social History   Social History  . Marital status: Divorced    Spouse name: N/A  . Number of children: N/A  . Years of education: N/A   Occupational History  . Not on file.   Social History Main Topics  . Smoking status: Former Smoker    Quit date: 04/02/1981  . Smokeless tobacco: Never Used  . Alcohol use 0.6 oz/week    1 Shots of liquor per week     Comment: occ  . Drug use: No  . Sexual activity: Not Currently   Other Topics Concern  . Not on file   Social History Narrative  . No narrative on file     No family history of premature CAD in 1st degree relatives.  Current Meds  Medication Sig  . acetaminophen (TYLENOL) 500 MG tablet Take 1,000 mg by mouth every 6 (six) hours as needed for mild pain.  Marland Kitchen allopurinol (ZYLOPRIM) 300 MG tablet Take 300 mg by mouth daily.  Marland Kitchen aspirin EC 81 MG tablet Take 81 mg by mouth daily.  . furosemide (LASIX) 40 MG tablet Take 40 mg by mouth daily.  . metolazone (ZAROXOLYN) 2.5 MG tablet Take 2.5 mg by mouth 2 (two) times daily.   . metoprolol tartrate (LOPRESSOR) 25 MG tablet Take 2 tablets (50 mg total) by  mouth 2 (two) times daily.  Marland Kitchen omeprazole (PRILOSEC) 20 MG capsule Take 20 mg by mouth every other day.   . [DISCONTINUED] potassium chloride (MICRO-K) 10 MEQ CR capsule Take 20 mEq by mouth 2 (two) times daily.   . [DISCONTINUED] tamsulosin (FLOMAX) 0.4 MG CAPS capsule Take 1 capsule (0.4 mg total) by mouth daily.  . [DISCONTINUED] traZODone (DESYREL) 50 MG tablet Take 50 mg by mouth at bedtime.      Review of systems complete and found to be negative unless listed above in HPI    Physical exam Blood pressure 134/82, pulse 77, weight (!) 339 lb (153.8 kg), SpO2 96 %. General: NAD Neck: No JVD, no thyromegaly or thyroid nodule.  Lungs: Clear to auscultation bilaterally with normal respiratory effort. CV: Nondisplaced PMI. Regular rate and irregular rhythm, normal S1/S2, no S3, no murmur.  1+ pitting pretibial edema b/l.  No carotid bruit.    Abdomen:  Soft, morbidly obese.  Skin: Intact without lesions or rashes.  Neurologic: Alert and oriented x 3.  Psych: Normal affect. Extremities: No clubbing or cyanosis.  HEENT: Normal.   ECG: Most recent ECG reviewed.   Labs: Lab Results  Component Value Date/Time   K 3.7 02/23/2015 04:38 AM   BUN 31 (H) 02/23/2015 04:38 AM   CREATININE 1.03 02/23/2015 04:38 AM   ALT 14 (L) 02/16/2015 08:00 AM   TSH 0.579 02/22/2015 04:04 PM   TSH 0.418 10/10/2013 08:02 AM   HGB 12.1 (L) 02/23/2015 04:38 AM     Lipids: No results found for: LDLCALC, LDLDIRECT, CHOL, TRIG, HDL      ASSESSMENT AND PLAN:  1. Atrial fibrillation:This appears to be chronic. He is symptomatically stable and heart rate is controlled on metoprolol 50 mg twice daily. I spoke to him about elevated thromboembolic risk and that aspirin will not signficantly lower with this risk. CHADSVASC score is 3 (CHF, hypertension, age). I will discontinue aspirin and start Xarelto 20 g daily.  2. Congestive heart failure: Likely diastolic in etiology. Current he takes Lasix 40 mg twice  daily and he thinks he takes metolazone 2.5 mg daily. I will have him clarify this dosage. I will order a 2-D echocardiogram with Doppler (with contrast) to evaluate cardiac structure, function, and regional wall motion.  3. Sleep apnea: This has likely been contributing to chronic atrial fibrillation. It is by itself and independent risk factor for strokes and myocardial infarction. I will obtain a sleep study  4. Hypertension: Controlled. No changes to therapy.   Disposition: Follow up in 6 weeks.  Signed: Prentice Docker, M.D., F.A.C.C.  12/31/2016, 1:12 PM

## 2016-12-31 NOTE — Patient Instructions (Signed)
Medication Instructions:  START XARELTO 20 MG DAILY   Labwork: NONE  Testing/Procedures: Your physician has requested that you have an echocardiogram. Echocardiography is a painless test that uses sound waves to create images of your heart. It provides your doctor with information about the size and shape of your heart and how well your heart's chambers and valves are working. This procedure takes approximately one hour. There are no restrictions for this procedure.  Your physician has recommended that you have a sleep study. This test records several body functions during sleep, including: brain activity, eye movement, oxygen and carbon dioxide blood levels, heart rate and rhythm, breathing rate and rhythm, the flow of air through your mouth and nose, snoring, body muscle movements, and chest and belly movement.    Follow-Up: Your physician recommends that you schedule a follow-up appointment in: 6 WEEKS    Any Other Special Instructions Will Be Listed Below (If Applicable).     If you need a refill on your cardiac medications before your next appointment, please call your pharmacy.

## 2017-01-07 ENCOUNTER — Ambulatory Visit (HOSPITAL_COMMUNITY)
Admission: RE | Admit: 2017-01-07 | Discharge: 2017-01-07 | Disposition: A | Discharge status: Home / Self Care | Payer: Medicare Other | Source: Ambulatory Visit | Attending: Cardiovascular Disease | Admitting: Cardiovascular Disease

## 2017-01-07 DIAGNOSIS — I4891 Unspecified atrial fibrillation: Secondary | ICD-10-CM | POA: Insufficient documentation

## 2017-01-07 DIAGNOSIS — I509 Heart failure, unspecified: Secondary | ICD-10-CM

## 2017-01-07 DIAGNOSIS — F1010 Alcohol abuse, uncomplicated: Secondary | ICD-10-CM | POA: Insufficient documentation

## 2017-01-07 DIAGNOSIS — I110 Hypertensive heart disease with heart failure: Secondary | ICD-10-CM | POA: Insufficient documentation

## 2017-01-07 LAB — ECHOCARDIOGRAM COMPLETE
CHL CUP STROKE VOLUME: 43 mL
CHL CUP TV REG PEAK VELOCITY: 193 cm/s
E decel time: 222 msec
E/e' ratio: 7.26
FS: 33 % (ref 28–44)
IVS/LV PW RATIO, ED: 1.03
LA ID, A-P, ES: 52 mm
LA vol index: 40.9 mL/m2
LADIAMINDEX: 1.83 cm/m2
LAVOL: 116 mL
LAVOLA4C: 110 mL
LDCA: 3.8 cm2
LEFT ATRIUM END SYS DIAM: 52 mm
LV E/e' medial: 7.26
LV E/e'average: 7.26
LV PW d: 10.1 mm — AB (ref 0.6–1.1)
LV SIMPSON'S DISK: 62
LV e' LATERAL: 14.6 cm/s
LV sys vol index: 9 mL/m2
LV sys vol: 26 mL
LVDIAVOL: 69 mL (ref 62–150)
LVDIAVOLIN: 24 mL/m2
LVOTD: 22 mm
MV Dec: 222
MV pk E vel: 106 m/s
MVPG: 4 mmHg
RV LATERAL S' VELOCITY: 11.1 cm/s
RV TAPSE: 14.7 mm
RV sys press: 18 mmHg
TDI e' lateral: 14.6
TDI e' medial: 11.1
TR max vel: 193 cm/s

## 2017-01-07 NOTE — Progress Notes (Signed)
*  PRELIMINARY RESULTS* Echocardiogram 2D Echocardiogram has been performed.  Frederick Brown 01/07/2017, 2:02 PM

## 2017-02-17 ENCOUNTER — Ambulatory Visit: Payer: Medicare Other | Admitting: Cardiovascular Disease

## 2017-06-03 ENCOUNTER — Other Ambulatory Visit: Payer: Self-pay | Admitting: Cardiovascular Disease

## 2017-07-03 ENCOUNTER — Other Ambulatory Visit: Payer: Self-pay | Admitting: Cardiovascular Disease

## 2017-08-04 ENCOUNTER — Other Ambulatory Visit: Payer: Self-pay | Admitting: Cardiovascular Disease

## 2017-09-23 ENCOUNTER — Telehealth: Payer: Self-pay

## 2017-09-23 MED ORDER — RIVAROXABAN 20 MG PO TABS: ORAL_TABLET | ORAL | 0 refills | Status: DC

## 2017-09-23 NOTE — Telephone Encounter (Signed)
Rx for xarelto sent. Appointment made for follow up.

## 2017-09-25 ENCOUNTER — Ambulatory Visit: Payer: Medicare Other | Admitting: Cardiovascular Disease

## 2017-09-25 ENCOUNTER — Encounter: Payer: Self-pay | Admitting: Cardiovascular Disease

## 2017-09-25 ENCOUNTER — Other Ambulatory Visit (HOSPITAL_COMMUNITY)
Admission: RE | Admit: 2017-09-25 | Discharge: 2017-09-25 | Disposition: A | Discharge status: Home / Self Care | Payer: Medicare Other | Source: Other Acute Inpatient Hospital | Attending: Cardiovascular Disease | Admitting: Cardiovascular Disease

## 2017-09-25 VITALS — BP 126/76 | HR 85 | Ht 70.0 in | Wt 369.0 lb

## 2017-09-25 DIAGNOSIS — I10 Essential (primary) hypertension: Secondary | ICD-10-CM

## 2017-09-25 DIAGNOSIS — G4733 Obstructive sleep apnea (adult) (pediatric): Secondary | ICD-10-CM | POA: Diagnosis not present

## 2017-09-25 DIAGNOSIS — I482 Chronic atrial fibrillation: Secondary | ICD-10-CM | POA: Diagnosis not present

## 2017-09-25 DIAGNOSIS — I4821 Permanent atrial fibrillation: Secondary | ICD-10-CM

## 2017-09-25 DIAGNOSIS — I5043 Acute on chronic combined systolic (congestive) and diastolic (congestive) heart failure: Secondary | ICD-10-CM | POA: Insufficient documentation

## 2017-09-25 DIAGNOSIS — K5900 Constipation, unspecified: Secondary | ICD-10-CM | POA: Diagnosis not present

## 2017-09-25 DIAGNOSIS — I5032 Chronic diastolic (congestive) heart failure: Secondary | ICD-10-CM | POA: Diagnosis not present

## 2017-09-25 LAB — CBC
HCT: 46 % (ref 39.0–52.0)
Hemoglobin: 14.8 g/dL (ref 13.0–17.0)
MCH: 31.4 pg (ref 26.0–34.0)
MCHC: 32.2 g/dL (ref 30.0–36.0)
MCV: 97.5 fL (ref 78.0–100.0)
Platelets: 186 10*3/uL (ref 150–400)
RBC: 4.72 MIL/uL (ref 4.22–5.81)
RDW: 15.1 % (ref 11.5–15.5)
WBC: 6.1 10*3/uL (ref 4.0–10.5)

## 2017-09-25 LAB — BASIC METABOLIC PANEL
Anion gap: 9 (ref 5–15)
BUN: 20 mg/dL (ref 8–23)
CO2: 25 mmol/L (ref 22–32)
CREATININE: 1.17 mg/dL (ref 0.61–1.24)
Calcium: 10.2 mg/dL (ref 8.9–10.3)
Chloride: 106 mmol/L (ref 98–111)
GFR calc non Af Amer: >60 mL/min (ref >60)
GLUCOSE: 105 mg/dL — AB (ref 70–99)
Potassium: 4.6 mmol/L (ref 3.5–5.1)
Sodium: 140 mmol/L (ref 135–145)

## 2017-09-25 MED ORDER — FUROSEMIDE 80 MG PO TABS: 80.0000 mg | ORAL_TABLET | Freq: Two times a day (BID) | ORAL | 3 refills | Status: DC

## 2017-09-25 MED ORDER — POTASSIUM CHLORIDE CRYS ER 20 MEQ PO TBCR: 20.0000 meq | EXTENDED_RELEASE_TABLET | Freq: Every day | ORAL | 3 refills | Status: DC

## 2017-09-25 NOTE — Progress Notes (Signed)
ekg 

## 2017-09-25 NOTE — Progress Notes (Signed)
SUBJECTIVE: The patient presents for past due follow-up.  I evaluated him once before in October 2018.  He has a history of permanent atrial fibrillation, hypertension, and chronic diastolic heart failure.  I reviewed the echocardiogram performed on 01/07/2017 which demonstrated normal left ventricular systolic function, LVEF 50 to 16%, with moderate biatrial dilatation.  He has a history of medication noncompliance.  He also has a history of morbid obesity and underwent panniculectomy in 2016.  He has a history of significant sleep apnea but did not tolerate CPAP in the past.  He does not get much by way of physical activity.  There have been no changes in baseline exertional dyspnea.  He has not seen his PCP this past year and told me he plans to see him next month.  He admits to eating too much food which led to his weight gain.  He currently takes Lasix 40 mg twice daily and says it used to work better but he is now having more leg and feet swelling.  He denies chest pain and palpitations.  He has bilateral feet neuropathy.  I ordered a sleep study at his last visit but he decided not to go because he would not use CPAP anyway. He also complains of constipation.  He said he eats vegetables but not much fruit.  I ordered and personally reviewed an ECG today which shows rate controlled atrial fibrillation, 76 bpm, with old inferior and anterolateral infarct pattern.   Review of Systems: As per "subjective", otherwise negative.  Allergies  Allergen Reactions  . Ciprofloxacin Rash    Patient is not aware of the following allergy    Current Outpatient Medications  Medication Sig Dispense Refill  . acetaminophen (TYLENOL) 500 MG tablet Take 1,000 mg by mouth every 6 (six) hours as needed for mild pain.    Marland Kitchen allopurinol (ZYLOPRIM) 300 MG tablet Take 300 mg by mouth daily.    Marland Kitchen aspirin EC 81 MG tablet Take 81 mg by mouth daily.    . furosemide (LASIX) 40 MG tablet Take 40 mg by  mouth daily.    . metolazone (ZAROXOLYN) 2.5 MG tablet Take 2.5 mg by mouth 2 (two) times daily.     . metoprolol tartrate (LOPRESSOR) 25 MG tablet Take 2 tablets (50 mg total) by mouth 2 (two) times daily. 60 tablet 2  . omeprazole (PRILOSEC) 20 MG capsule Take 20 mg by mouth every other day.     . rivaroxaban (XARELTO) 20 MG TABS tablet TAKE 1 TABLET BY MOUTH ONCE DAILY WITH SUPPER. 30 tablet 0  . simvastatin (ZOCOR) 10 MG tablet Take 10 mg by mouth daily.     No current facility-administered medications for this visit.     Past Medical History:  Diagnosis Date  . A-fib (HCC)   . Alcohol abuse   . CHF (congestive heart failure) (HCC)   . Chronic respiratory failure with hypoxia (HCC) 05/28/2011  . Cor pulmonale, chronic [facility]) February 2013  . Degenerative joint disease   . Fracture of great toe, left, closed 05/28/2011  . Fracture of metatarsal of left foot, closed 05/28/2011  . Gastroesophageal reflux disease   . Hypertension   . Obesity hypoventilation syndrome [facility]) February 2013  . Obesity, morbid (more than 100 lbs over ideal weight or BMI > 40) (HCC)   . Pickwickian syndrome (HCC)   . Umbilical hernia   . Urticarial rash 05/31/2011   From Cipro  . Vitamin B12 deficiency 05/22/2011  Past Surgical History:  Procedure Laterality Date  . CHOLECYSTECTOMY    . CYSTOSCOPY    . CYSTOSCOPY W/ URETERAL STENT PLACEMENT Bilateral 02/22/2015   Procedure: CYSTOSCOPY WITH BILATERAL STENT EXCHANGE;  Surgeon: Malen Gauze, MD;  Location: [facility];  Service: Urology;  Laterality: Bilateral;  . CYSTOSCOPY WITH HOLMIUM LASER LITHOTRIPSY Bilateral 02/22/2015   Procedure: CYSTOSCOPY WITH HOLMIUM LASER BILATERAL LITHOTRIPSY;  Surgeon: Malen Gauze, MD;  Location: [facility];  Service: Urology;  Laterality: Bilateral;  . CYSTOSCOPY WITH RETROGRADE PYELOGRAM, URETEROSCOPY AND STENT PLACEMENT Bilateral 01/18/2015   Procedure: CYSTOSCOPY WITH BILATERAL RETROGRADE PYELOGRAM, AND BILATERAL STENT  PLACEMENT;  Surgeon: Malen Gauze, MD;  Location: [facility];  Service: Urology;  Laterality: Bilateral;  . CYSTOSCOPY/RETROGRADE/URETEROSCOPY/STONE EXTRACTION WITH BASKET Bilateral 02/22/2015   Procedure: CYSTOSCOPY/BILATERAL RETROGRADE/BILATERAL URETEROSCOPY/BILATERAL STONE EXTRACTION WITH BASKET;  Surgeon: Malen Gauze, MD;  Location: [facility];  Service: Urology;  Laterality: Bilateral;  . KNEE SURGERY      Social History   Socioeconomic History  . Marital status: Divorced    Spouse name: Not on file  . Number of children: Not on file  . Years of education: Not on file  . Highest education level: Not on file  Occupational History  . Not on file  Social Needs  . Financial resource strain: Not on file  . Food insecurity:    Worry: Not on file    Inability: Not on file  . Transportation needs:    Medical: Not on file    Non-medical: Not on file  Tobacco Use  . Smoking status: Former Smoker    Last attempt to quit: 04/02/1981    Years since quitting: 36.5  . Smokeless tobacco: Never Used  Substance and Sexual Activity  . Alcohol use: Yes    Alcohol/week: 0.6 oz    Types: 1 Shots of liquor per week    Comment: occ  . Drug use: No  . Sexual activity: Not Currently  Lifestyle  . Physical activity:    Days per week: Not on file    Minutes per session: Not on file  . Stress: Not on file  Relationships  . Social connections:    Talks on phone: Not on file    Gets together: Not on file    Attends religious service: Not on file    Active member of club or organization: Not on file    Attends meetings of clubs or organizations: Not on file    Relationship status: Not on file  . Intimate partner violence:    Fear of current or ex partner: Not on file    Emotionally abused: Not on file    Physically abused: Not on file    Forced sexual activity: Not on file  Other Topics Concern  . Not on file  Social History Narrative  . Not on file     Vitals:   09/25/17 1531    BP: 126/76  Pulse: 85  SpO2: 97%  Weight: (!) 369 lb (167.4 kg)  Height: 5\' 10"  (1.778 m)    Wt Readings from Last 3 Encounters:  09/25/17 (!) 369 lb (167.4 kg)  12/31/16 (!) 339 lb (153.8 kg)  02/24/15 (!) 321 lb 10.4 oz (145.9 kg)     PHYSICAL EXAM General: NAD HEENT: Normal. Neck: No JVD, no thyromegaly. Lungs: Clear to auscultation bilaterally with normal respiratory effort. CV: Regular rate and irregular rhythm, normal S1/S2, no S3, no murmur.  1+ pitting bilateral lower extremity edema.  No carotid bruit.   Abdomen: Soft, nontender, morbidly obese.  Neurologic: Alert and oriented.  Psych: Normal affect. Skin: Normal. Musculoskeletal: No gross deformities.    ECG: Reviewed above under Subjective   Labs: Lab Results  Component Value Date/Time   K 3.7 02/23/2015 04:38 AM   BUN 31 (H) 02/23/2015 04:38 AM   CREATININE 1.03 02/23/2015 04:38 AM   ALT 14 (L) 02/16/2015 08:00 AM   TSH 0.579 02/22/2015 04:04 PM   TSH 0.418 10/10/2013 08:02 AM   HGB 12.1 (L) 02/23/2015 04:38 AM     Lipids: No results found for: LDLCALC, LDLDIRECT, CHOL, TRIG, HDL     ASSESSMENT AND PLAN: 1.  Permanent atrial fibrillation: Symptomatically stable on metoprolol tartrate 50 mg twice daily.  He is systemically anticoagulated with Xarelto.  No changes to therapy.  2.  Chronic diastolic heart failure: Echocardiogram reviewed above.  He currently takes Lasix 40 mg twice daily but it has lost its potency.  I will increase to 80 mg twice daily and add supplemental potassium chloride 20 meq daily.  I will check a basic metabolic panel and a CBC.  3.  Sleep apnea: Likely contributing to atrial fibrillation.  I ordered a sleep study at his initial evaluation in October 2018 but he decided not to proceed as he would not use CPAP anyway.  4.  Hypertension: Blood pressure is normal.  I will monitor given increasing diuretic dose.  5.  Morbid obesity: Weight is up 30 pounds since his last visit  with me in October 2018.  He admits to overeating.  6.  Constipation: I recommended he increase fiber intake by way of eating fresh fruit and vegetables.    Disposition: Follow up 6 months   Prentice DockerSuresh Koneswaran, M.D., F.A.C.C.

## 2017-09-25 NOTE — Patient Instructions (Addendum)
Your physician wants you to follow-up in: 6 months  with Dr.Koneswaran You will receive a reminder letter in the mail two months in advance. If you don't receive a letter, please call our office to schedule the follow-up appointment.      STOP Aspirin   STOP Metolazone    Take lasix 80 mg twice a day   Take Potassium 20 meq daily    Get lab work : cbc, bmet       If you need a refill on your cardiac medications before your next appointment, please call your pharmacy.      Thank you for choosing [facility] !

## 2017-10-27 ENCOUNTER — Other Ambulatory Visit: Payer: Self-pay | Admitting: Cardiovascular Disease

## 2017-11-28 ENCOUNTER — Telehealth: Payer: Self-pay | Admitting: Cardiovascular Disease

## 2017-11-28 NOTE — Telephone Encounter (Signed)
Pt is in the doughnut hole w/ his XARELTO 20 MG TABS tablet [[id number]][[id number]]

## 2017-12-02 ENCOUNTER — Telehealth: Payer: Self-pay

## 2017-12-02 NOTE — Telephone Encounter (Signed)
Called pt. He stated that he cannot afford his xarelto and would like to be switched back to coumadin. I gave him several weeks of samples while we wait for Dr. Junius Argyle response. Pt. Will pick up samples this week.Samples placed in front office.

## 2017-12-02 NOTE — Telephone Encounter (Signed)
Pt picked up samples of xareto.

## 2017-12-08 NOTE — Telephone Encounter (Signed)
That would be fine 

## 2017-12-08 NOTE — Telephone Encounter (Signed)
Pt made aware. Will forward to front office to schedule.  

## 2017-12-23 DIAGNOSIS — G894 Chronic pain syndrome: Secondary | ICD-10-CM | POA: Diagnosis not present

## 2017-12-23 DIAGNOSIS — R600 Localized edema: Secondary | ICD-10-CM | POA: Diagnosis not present

## 2017-12-23 DIAGNOSIS — Z1211 Encounter for screening for malignant neoplasm of colon: Secondary | ICD-10-CM | POA: Diagnosis not present

## 2017-12-23 DIAGNOSIS — Z6841 Body Mass Index (BMI) 40.0 and over, adult: Secondary | ICD-10-CM | POA: Diagnosis not present

## 2017-12-23 DIAGNOSIS — Z0001 Encounter for general adult medical examination with abnormal findings: Secondary | ICD-10-CM | POA: Diagnosis not present

## 2017-12-23 DIAGNOSIS — R7309 Other abnormal glucose: Secondary | ICD-10-CM | POA: Diagnosis not present

## 2018-03-13 ENCOUNTER — Telehealth: Payer: Self-pay | Admitting: Cardiovascular Disease

## 2018-03-13 NOTE — Telephone Encounter (Signed)
Patient is requesting samples of Xarelto 20mg / tg  ° °

## 2018-03-13 NOTE — Telephone Encounter (Signed)
Pt notified samples are waiting @ front office. He will pick up Monday.

## 2018-06-25 ENCOUNTER — Other Ambulatory Visit: Payer: Self-pay | Admitting: Cardiovascular Disease

## 2018-07-03 ENCOUNTER — Other Ambulatory Visit: Payer: Self-pay | Admitting: Cardiovascular Disease

## 2018-07-21 ENCOUNTER — Ambulatory Visit: Payer: Medicare Other | Admitting: Cardiovascular Disease

## 2018-11-07 ENCOUNTER — Other Ambulatory Visit: Payer: Self-pay | Admitting: Cardiovascular Disease

## 2018-11-09 ENCOUNTER — Ambulatory Visit: Payer: Medicare Other | Admitting: Cardiovascular Disease

## 2018-11-24 ENCOUNTER — Ambulatory Visit: Payer: Medicare Other | Admitting: Cardiovascular Disease

## 2018-12-15 ENCOUNTER — Telehealth: Payer: Self-pay | Admitting: Cardiovascular Disease

## 2018-12-15 NOTE — Telephone Encounter (Signed)
Forward to [facility].

## 2018-12-15 NOTE — Telephone Encounter (Signed)
Patient would like to discuss alternatives to Xarelto due to cost. / tg

## 2018-12-15 NOTE — Telephone Encounter (Signed)
Pt calling today bc he is now in the doughnut hole. His Xarelto is about $150 per month. He wants to know if there is a cheaper alternative. I have given him the name of Eliquis and Pradaxa. He will contact his insurance company to see if they are less expensive.   Pt is also interested in financial assistance through the manufacturer. I advised him I would forward to Dr. Court Joy RN to research .  He appreciated the call and had no additional questions.

## 2018-12-18 NOTE — Telephone Encounter (Signed)
Pt's phone says "your call can not be completed as dialed". Patient needs to pick up patient assistance form from office and provide proof of income and apply to program

## 2019-01-15 ENCOUNTER — Ambulatory Visit: Payer: Medicare Other | Admitting: Cardiovascular Disease

## 2019-01-15 ENCOUNTER — Telehealth (INDEPENDENT_AMBULATORY_CARE_PROVIDER_SITE_OTHER): Payer: Medicare Other | Admitting: Cardiovascular Disease

## 2019-01-15 ENCOUNTER — Encounter: Payer: Self-pay | Admitting: Cardiovascular Disease

## 2019-01-15 ENCOUNTER — Other Ambulatory Visit: Payer: Self-pay

## 2019-01-15 VITALS — BP 114/71 | HR 74 | Ht 70.0 in | Wt 366.0 lb

## 2019-01-15 DIAGNOSIS — I10 Essential (primary) hypertension: Secondary | ICD-10-CM

## 2019-01-15 DIAGNOSIS — I5032 Chronic diastolic (congestive) heart failure: Secondary | ICD-10-CM | POA: Diagnosis not present

## 2019-01-15 DIAGNOSIS — I4821 Permanent atrial fibrillation: Secondary | ICD-10-CM

## 2019-01-15 DIAGNOSIS — G4733 Obstructive sleep apnea (adult) (pediatric): Secondary | ICD-10-CM

## 2019-01-15 NOTE — Patient Instructions (Signed)
Medication Instructions:  Your physician recommends that you continue on your current medications as directed. Please refer to the Current Medication list given to you today.   Labwork: NONE  Testing/Procedures: none  Follow-Up: Your physician wants you to follow-up in: 1 YEAR. You will receive a reminder letter in the mail two months in advance. If you don't receive a letter, please call our office to schedule the follow-up appointment.   Any Other Special Instructions Will Be Listed Below (If Applicable).     If you need a refill on your cardiac medications before your next appointment, please call your pharmacy.

## 2019-01-15 NOTE — Progress Notes (Signed)
Virtual Visit via Telephone Note   This visit type was conducted due to national recommendations for restrictions regarding the COVID-19 Pandemic (e.g. social distancing) in an effort to limit this patient's exposure and mitigate transmission in our community.  Due to his co-morbid illnesses, this patient is at least at moderate risk for complications without adequate follow up.  This format is felt to be most appropriate for this patient at this time.  The patient did not have access to video technology/had technical difficulties with video requiring transitioning to audio format only (telephone).  All issues noted in this document were discussed and addressed.  No physical exam could be performed with this format.  Please refer to the patient's chart for his  consent to telehealth for [facility].   Date:  01/15/2019   ID:  Frederick Brown, DOB 09-19-1950, MRN [id number]  Patient Location: Home Provider Location: Office  PCP:  [name], MD  Cardiologist:  Kate Sable, MD  Electrophysiologist:  None   Evaluation Performed:  Follow-Up Visit  Chief Complaint:  Atrial fibrillation  History of Present Illness:    Frederick Brown is a 68 y.o. male with a history of permanent atrial fibrillation, hypertension, and chronic diastolic heart failure.  He has a history of medication noncompliance.  He also has a history of morbid obesity and underwent panniculectomy in 2016.  He has a history of significant sleep apnea but did not tolerate CPAP in the past.  I reviewed the echocardiogram performed on 01/07/2017 which demonstrated normal left ventricular systolic function, LVEF 50 to 55%, with moderate biatrial dilatation.  He denies chest pain and palpitations. He denies fevers.  A couple of his family members have [place].  Xarelto is costing him $153 per month as he is in the doughnut hole.     Past Medical History:  Diagnosis Date  . A-fib ([place])   . Alcohol abuse   . CHF  (congestive heart failure) ([place])   . Chronic respiratory failure with hypoxia ([place]) 05/28/2011  . Cor pulmonale, chronic [facility]) February 2013  . Degenerative joint disease   . Fracture of great toe, left, closed 05/28/2011  . Fracture of metatarsal of left foot, closed 05/28/2011  . Gastroesophageal reflux disease   . Hypertension   . Obesity hypoventilation syndrome [facility]) February 2013  . Obesity, morbid (more than 100 lbs over ideal weight or BMI > 40) (HCC)   . Pickwickian syndrome ([place])   . Umbilical hernia   . Urticarial rash 05/31/2011   From Cipro  . Vitamin B12 deficiency 05/22/2011   Past Surgical History:  Procedure Laterality Date  . CHOLECYSTECTOMY    . CYSTOSCOPY    . CYSTOSCOPY W/ URETERAL STENT PLACEMENT Bilateral 02/22/2015   Procedure: CYSTOSCOPY WITH BILATERAL STENT EXCHANGE;  Surgeon: Cleon Gustin, MD;  Location: [facility];  Service: Urology;  Laterality: Bilateral;  . CYSTOSCOPY WITH HOLMIUM LASER LITHOTRIPSY Bilateral 02/22/2015   Procedure: CYSTOSCOPY WITH HOLMIUM LASER BILATERAL LITHOTRIPSY;  Surgeon: Cleon Gustin, MD;  Location: [facility];  Service: Urology;  Laterality: Bilateral;  . CYSTOSCOPY WITH RETROGRADE PYELOGRAM, URETEROSCOPY AND STENT PLACEMENT Bilateral 01/18/2015   Procedure: CYSTOSCOPY WITH BILATERAL RETROGRADE PYELOGRAM, AND BILATERAL STENT PLACEMENT;  Surgeon: Cleon Gustin, MD;  Location: [facility];  Service: Urology;  Laterality: Bilateral;  . CYSTOSCOPY/RETROGRADE/URETEROSCOPY/STONE EXTRACTION WITH BASKET Bilateral 02/22/2015   Procedure: CYSTOSCOPY/BILATERAL RETROGRADE/BILATERAL URETEROSCOPY/BILATERAL STONE EXTRACTION WITH BASKET;  Surgeon: Cleon Gustin, MD;  Location: [facility];  Service: Urology;  Laterality: Bilateral;  . KNEE SURGERY       Current Meds  Medication Sig  . acetaminophen (TYLENOL) 500 MG tablet Take 1,000 mg by mouth every 6 (six) hours as needed for mild pain.  Marland Kitchen allopurinol (ZYLOPRIM) 300 MG tablet Take 300 mg by  mouth daily.  . furosemide (LASIX) 80 MG tablet TAKE 1 TABLET BY MOUTH TWICE DAILY.  . metoprolol tartrate (LOPRESSOR) 25 MG tablet Take 2 tablets (50 mg total) by mouth 2 (two) times daily.  Marland Kitchen omeprazole (PRILOSEC) 20 MG capsule Take 20 mg by mouth every other day.   . potassium chloride SA (K-DUR,KLOR-CON) 20 MEQ tablet TAKE 1 TABLET BY MOUTH ONCE DAILY.  . simvastatin (ZOCOR) 10 MG tablet Take 10 mg by mouth daily.  Frederick Brown 20 MG TABS tablet TAKE 1 TABLET BY MOUTH ONCE DAILY WITH SUPPER.     Allergies:   Ciprofloxacin   Social History   Tobacco Use  . Smoking status: Former Smoker    Quit date: 04/02/1981    Years since quitting: 37.8  . Smokeless tobacco: Never Used  Substance Use Topics  . Alcohol use: Yes    Alcohol/week: 1.0 standard drinks    Types: 1 Shots of liquor per week    Comment: occ  . Drug use: No     Family Hx: The patient's family history includes Heart attack in his father; Heart failure in his father; Hypertension in his father; Stroke in his father.  ROS:   Please see the history of present illness.     All other systems reviewed and are negative.   Prior CV studies:   The following studies were reviewed today:  NA  Labs/Other Tests and Data Reviewed:    EKG:  No ECG reviewed.  Recent Labs: No results found for requested labs within last 8760 hours.   Recent Lipid Panel No results found for: CHOL, TRIG, HDL, CHOLHDL, LDLCALC, LDLDIRECT  Wt Readings from Last 3 Encounters:  01/15/19 (!) 366 lb (166 kg)  09/25/17 (!) 369 lb (167.4 kg)  12/31/16 (!) 339 lb (153.8 kg)     Objective:    Vital Signs:  BP 114/71   Pulse 74   Ht 5\' 10"  (1.778 m)   Wt (!) 366 lb (166 kg)   BMI 52.52 kg/m    VITAL SIGNS:  reviewed  ASSESSMENT & PLAN:    1.  Permanent atrial fibrillation: Symptomatically stable on metoprolol tartrate 50 mg twice daily.  He is systemically anticoagulated with Xarelto.  No changes to therapy. Xarelto is costing him $153  per month as he is in the doughnut hole. We talked about the possibility of switching to warfarin and he will think about it.  2.  Chronic diastolic heart failure: Echocardiogram reviewed above.  He currently takes Lasix 80 mg twice daily and supplemental potassium chloride 20 meq daily.   3.  Sleep apnea: Likely contributing to atrial fibrillation.  I ordered a sleep study at his initial evaluation in October 2018 but he decided not to proceed as he would not use CPAP anyway.  4.  Hypertension: Blood pressure is normal. No changes.  5.  Morbid obesity: He needs significant weight loss.    COVID-19 Education: The signs and symptoms of COVID-19 were discussed with the patient and how to seek care for testing (follow up with PCP or arrange E-visit). The importance of social distancing was discussed today.  Time:   Today, I have spent 5 minutes with the  patient with telehealth technology discussing the above problems.     Medication Adjustments/Labs and Tests Ordered: Current medicines are reviewed at length with the patient today.  Concerns regarding medicines are outlined above.   Tests Ordered: No orders of the defined types were placed in this encounter.   Medication Changes: No orders of the defined types were placed in this encounter.   Follow Up:  Either In Person or Virtual in 1 year(s)  Signed, Prentice DockerSuresh Dresden Ament, MD  01/15/2019 12:04 PM    [facility]

## 2019-02-09 DIAGNOSIS — I10 Essential (primary) hypertension: Secondary | ICD-10-CM | POA: Diagnosis not present

## 2019-02-09 DIAGNOSIS — Z1389 Encounter for screening for other disorder: Secondary | ICD-10-CM | POA: Diagnosis not present

## 2019-02-09 DIAGNOSIS — Z23 Encounter for immunization: Secondary | ICD-10-CM | POA: Diagnosis not present

## 2019-02-09 DIAGNOSIS — Z6841 Body Mass Index (BMI) 40.0 and over, adult: Secondary | ICD-10-CM | POA: Diagnosis not present

## 2019-02-09 DIAGNOSIS — Z0001 Encounter for general adult medical examination with abnormal findings: Secondary | ICD-10-CM | POA: Diagnosis not present

## 2019-02-09 DIAGNOSIS — R7309 Other abnormal glucose: Secondary | ICD-10-CM | POA: Diagnosis not present

## 2019-03-15 ENCOUNTER — Other Ambulatory Visit: Payer: Self-pay | Admitting: Cardiovascular Disease

## 2019-04-07 ENCOUNTER — Other Ambulatory Visit: Payer: Self-pay | Admitting: Cardiovascular Disease

## 2019-07-24 ENCOUNTER — Other Ambulatory Visit: Payer: Self-pay | Admitting: Cardiovascular Disease

## 2019-07-30 DIAGNOSIS — I509 Heart failure, unspecified: Secondary | ICD-10-CM | POA: Diagnosis not present

## 2019-07-30 DIAGNOSIS — N182 Chronic kidney disease, stage 2 (mild): Secondary | ICD-10-CM | POA: Diagnosis not present

## 2019-07-30 DIAGNOSIS — I130 Hypertensive heart and chronic kidney disease with heart failure and stage 1 through stage 4 chronic kidney disease, or unspecified chronic kidney disease: Secondary | ICD-10-CM | POA: Diagnosis not present

## 2019-07-30 DIAGNOSIS — I4891 Unspecified atrial fibrillation: Secondary | ICD-10-CM | POA: Diagnosis not present

## 2019-08-13 ENCOUNTER — Other Ambulatory Visit: Payer: Self-pay | Admitting: Cardiovascular Disease

## 2019-08-30 DIAGNOSIS — I509 Heart failure, unspecified: Secondary | ICD-10-CM | POA: Diagnosis not present

## 2019-08-30 DIAGNOSIS — I130 Hypertensive heart and chronic kidney disease with heart failure and stage 1 through stage 4 chronic kidney disease, or unspecified chronic kidney disease: Secondary | ICD-10-CM | POA: Diagnosis not present

## 2019-08-30 DIAGNOSIS — I4891 Unspecified atrial fibrillation: Secondary | ICD-10-CM | POA: Diagnosis not present

## 2019-08-30 DIAGNOSIS — N182 Chronic kidney disease, stage 2 (mild): Secondary | ICD-10-CM | POA: Diagnosis not present

## 2019-09-29 DIAGNOSIS — I4891 Unspecified atrial fibrillation: Secondary | ICD-10-CM | POA: Diagnosis not present

## 2019-09-29 DIAGNOSIS — N182 Chronic kidney disease, stage 2 (mild): Secondary | ICD-10-CM | POA: Diagnosis not present

## 2019-09-29 DIAGNOSIS — I509 Heart failure, unspecified: Secondary | ICD-10-CM | POA: Diagnosis not present

## 2019-09-29 DIAGNOSIS — I130 Hypertensive heart and chronic kidney disease with heart failure and stage 1 through stage 4 chronic kidney disease, or unspecified chronic kidney disease: Secondary | ICD-10-CM | POA: Diagnosis not present

## 2019-11-24 ENCOUNTER — Telehealth: Payer: Self-pay | Admitting: Student

## 2019-11-24 NOTE — Telephone Encounter (Signed)
Please call xarelto to [facility]   If needed call [phone]   Thanks renee

## 2019-11-24 NOTE — Telephone Encounter (Signed)
    Can provide refills. Please request a copy of recent labs from his PCP to include a CBC and BMET as the most recent labs on file are from 08/2017.  Signed, Ellsworth Lennox, PA-C 11/24/2019, 5:01 PM Pager: [phone]

## 2019-11-25 ENCOUNTER — Encounter: Payer: Self-pay | Admitting: *Deleted

## 2019-11-25 ENCOUNTER — Other Ambulatory Visit: Payer: Self-pay | Admitting: Student

## 2019-11-25 MED ORDER — RIVAROXABAN 20 MG PO TABS: ORAL_TABLET | ORAL | 11 refills | Status: DC

## 2019-11-25 NOTE — Telephone Encounter (Signed)
Please call in xarelto to [facility] Pt left message on yesterday    Per Pt Please call when med has been called to [facility] [phone]

## 2019-11-25 NOTE — Telephone Encounter (Signed)
Pt notified that Xarelto was refilled on yesterday. Pt voiced understanding and stated that the pharmacy called him on last evening to notify that it was ready to be picked up.

## 2019-11-25 NOTE — Telephone Encounter (Signed)
Refill complete 

## 2019-12-30 DIAGNOSIS — N182 Chronic kidney disease, stage 2 (mild): Secondary | ICD-10-CM | POA: Diagnosis not present

## 2019-12-30 DIAGNOSIS — I10 Essential (primary) hypertension: Secondary | ICD-10-CM | POA: Diagnosis not present

## 2019-12-30 DIAGNOSIS — I509 Heart failure, unspecified: Secondary | ICD-10-CM | POA: Diagnosis not present

## 2019-12-30 DIAGNOSIS — G894 Chronic pain syndrome: Secondary | ICD-10-CM | POA: Diagnosis not present

## 2020-01-29 DIAGNOSIS — N182 Chronic kidney disease, stage 2 (mild): Secondary | ICD-10-CM | POA: Diagnosis not present

## 2020-01-29 DIAGNOSIS — I10 Essential (primary) hypertension: Secondary | ICD-10-CM | POA: Diagnosis not present

## 2020-01-29 DIAGNOSIS — G894 Chronic pain syndrome: Secondary | ICD-10-CM | POA: Diagnosis not present

## 2020-01-29 DIAGNOSIS — I509 Heart failure, unspecified: Secondary | ICD-10-CM | POA: Diagnosis not present

## 2020-02-02 ENCOUNTER — Ambulatory Visit: Payer: Medicare Other | Admitting: Student

## 2020-02-29 DIAGNOSIS — I10 Essential (primary) hypertension: Secondary | ICD-10-CM | POA: Diagnosis not present

## 2020-02-29 DIAGNOSIS — G894 Chronic pain syndrome: Secondary | ICD-10-CM | POA: Diagnosis not present

## 2020-02-29 DIAGNOSIS — N182 Chronic kidney disease, stage 2 (mild): Secondary | ICD-10-CM | POA: Diagnosis not present

## 2020-02-29 DIAGNOSIS — I509 Heart failure, unspecified: Secondary | ICD-10-CM | POA: Diagnosis not present

## 2020-03-02 DIAGNOSIS — I10 Essential (primary) hypertension: Secondary | ICD-10-CM | POA: Diagnosis not present

## 2020-03-02 DIAGNOSIS — N182 Chronic kidney disease, stage 2 (mild): Secondary | ICD-10-CM | POA: Diagnosis not present

## 2020-03-02 DIAGNOSIS — Z0000 Encounter for general adult medical examination without abnormal findings: Secondary | ICD-10-CM | POA: Diagnosis not present

## 2020-03-02 DIAGNOSIS — E538 Deficiency of other specified B group vitamins: Secondary | ICD-10-CM | POA: Diagnosis not present

## 2020-03-02 DIAGNOSIS — I509 Heart failure, unspecified: Secondary | ICD-10-CM | POA: Diagnosis not present

## 2020-03-02 DIAGNOSIS — Z6841 Body Mass Index (BMI) 40.0 and over, adult: Secondary | ICD-10-CM | POA: Diagnosis not present

## 2020-03-02 DIAGNOSIS — I4891 Unspecified atrial fibrillation: Secondary | ICD-10-CM | POA: Diagnosis not present

## 2020-03-02 DIAGNOSIS — E782 Mixed hyperlipidemia: Secondary | ICD-10-CM | POA: Diagnosis not present

## 2020-03-13 ENCOUNTER — Ambulatory Visit: Payer: Medicare Other | Admitting: Cardiology

## 2020-05-01 DIAGNOSIS — I509 Heart failure, unspecified: Secondary | ICD-10-CM | POA: Diagnosis not present

## 2020-05-01 DIAGNOSIS — I10 Essential (primary) hypertension: Secondary | ICD-10-CM | POA: Diagnosis not present

## 2020-05-01 DIAGNOSIS — N182 Chronic kidney disease, stage 2 (mild): Secondary | ICD-10-CM | POA: Diagnosis not present

## 2020-05-01 DIAGNOSIS — G894 Chronic pain syndrome: Secondary | ICD-10-CM | POA: Diagnosis not present

## 2020-05-29 DIAGNOSIS — N182 Chronic kidney disease, stage 2 (mild): Secondary | ICD-10-CM | POA: Diagnosis not present

## 2020-05-29 DIAGNOSIS — G894 Chronic pain syndrome: Secondary | ICD-10-CM | POA: Diagnosis not present

## 2020-05-29 DIAGNOSIS — I10 Essential (primary) hypertension: Secondary | ICD-10-CM | POA: Diagnosis not present

## 2020-05-29 DIAGNOSIS — I509 Heart failure, unspecified: Secondary | ICD-10-CM | POA: Diagnosis not present

## 2020-06-16 ENCOUNTER — Telehealth: Payer: Self-pay | Admitting: Student

## 2020-06-16 NOTE — Telephone Encounter (Signed)
Spoke to patient about the difference between Xarelto and Warfarin. Patient decided that the travel every couple of weeks for INR checks would not be worth switching medication. Patient has decided to stay on Xarelto for the time being.

## 2020-06-16 NOTE — Telephone Encounter (Signed)
New message     Patient states he cant afford the Xarelto - we wants to switch to warfarin ?  He is trying to research his options, also he is switching insurance companies and Xarelto is more expensive

## 2020-06-28 DIAGNOSIS — I10 Essential (primary) hypertension: Secondary | ICD-10-CM | POA: Diagnosis not present

## 2020-06-28 DIAGNOSIS — N182 Chronic kidney disease, stage 2 (mild): Secondary | ICD-10-CM | POA: Diagnosis not present

## 2020-06-28 DIAGNOSIS — I509 Heart failure, unspecified: Secondary | ICD-10-CM | POA: Diagnosis not present

## 2020-07-29 DIAGNOSIS — I10 Essential (primary) hypertension: Secondary | ICD-10-CM | POA: Diagnosis not present

## 2020-07-29 DIAGNOSIS — N182 Chronic kidney disease, stage 2 (mild): Secondary | ICD-10-CM | POA: Diagnosis not present

## 2020-08-29 DIAGNOSIS — I10 Essential (primary) hypertension: Secondary | ICD-10-CM | POA: Diagnosis not present

## 2020-08-29 DIAGNOSIS — I509 Heart failure, unspecified: Secondary | ICD-10-CM | POA: Diagnosis not present

## 2020-08-29 DIAGNOSIS — N182 Chronic kidney disease, stage 2 (mild): Secondary | ICD-10-CM | POA: Diagnosis not present

## 2020-09-08 ENCOUNTER — Other Ambulatory Visit: Payer: Self-pay | Admitting: Cardiology

## 2020-09-28 DIAGNOSIS — N182 Chronic kidney disease, stage 2 (mild): Secondary | ICD-10-CM | POA: Diagnosis not present

## 2020-09-28 DIAGNOSIS — I10 Essential (primary) hypertension: Secondary | ICD-10-CM | POA: Diagnosis not present

## 2020-09-28 DIAGNOSIS — I509 Heart failure, unspecified: Secondary | ICD-10-CM | POA: Diagnosis not present

## 2020-10-29 DIAGNOSIS — I10 Essential (primary) hypertension: Secondary | ICD-10-CM | POA: Diagnosis not present

## 2020-10-29 DIAGNOSIS — N182 Chronic kidney disease, stage 2 (mild): Secondary | ICD-10-CM | POA: Diagnosis not present

## 2020-10-29 DIAGNOSIS — I509 Heart failure, unspecified: Secondary | ICD-10-CM | POA: Diagnosis not present

## 2020-11-10 ENCOUNTER — Other Ambulatory Visit: Payer: Self-pay | Admitting: Student

## 2020-11-10 NOTE — Telephone Encounter (Signed)
Pt has not been seen since 01/15/19, pt is overdue for follow-up.  Pt needs appt will send message to schedulers to contact pt for appt.  Last labs 02/09/19 Creat 1.05, pt is also overdue for labwork.  Will need OV before refill can be done. Sent message to [place] office schedulers.  Will await pt appt to refill rx.

## 2020-11-13 NOTE — Telephone Encounter (Signed)
Carney Bern, Will you please get this pt scheduled with someone ASAP.  He was last seen in 2020 and needs refills.  Put in appt notes he will need a BMP/CBC. Thanks, Misty Stanley

## 2020-12-12 ENCOUNTER — Ambulatory Visit: Payer: Medicare Other | Admitting: Cardiology

## 2021-01-02 ENCOUNTER — Telehealth: Payer: Self-pay | Admitting: Student

## 2021-01-02 MED ORDER — RIVAROXABAN 20 MG PO TABS: 20.0000 mg | ORAL_TABLET | Freq: Every day | ORAL | 1 refills | Status: DC

## 2021-01-02 NOTE — Telephone Encounter (Signed)
Prescription refill request for Xarelto received.  Indication: Atrial Fib Last office visit: 01/15/19  Junius Argyle MD Weight: 166kg Age: 70 Scr: 1.3 on 03/02/20 CrCl: 125.92  Based on above findings Xarelto 20mg  daily is the appropriate dose.  Pt is past due for MD appt and Lab work.  Appr made with on 02/13/21.  Will need BMP/CBC at that time.  Refill approved x 1 until appt.

## 2021-01-02 NOTE — Telephone Encounter (Signed)
New message    *STAT* If patient is at the pharmacy, call can be transferred to refill team.   1. Which medications need to be refilled? (please list name of each medication and dose if known)  xarelto  2. Which pharmacy/location (including street and city if local pharmacy) is medication to be sent to? [facility] [address]   3. Do they need a 30 day or 90 day supply? 30

## 2021-02-09 ENCOUNTER — Other Ambulatory Visit: Payer: Self-pay | Admitting: Cardiology

## 2021-02-09 NOTE — Progress Notes (Deleted)
Cardiology Office Note   Date:  02/09/2021   ID:  Frederick Brown, DOB [birth year], MRN [id number]  PCP:  Assunta Found, MD  Cardiologist:   was Dr. Purvis Sheffield    No chief complaint on file.     History of Present Illness: Frederick Brown is a 70 y.o. male who presents for atrial fib.  Pt with hx of permanent atrial fib, HTN, and chronic diastolic HF.  Hx of non compliance, morbid obesity and panniculectomy in 2016.  Has OSA but did not tolerate CPAP.  Echo 01/07/2017 which demonstrated normal left ventricular systolic function, LVEF 50 to 35%, with moderate biatrial dilatation.    Today***  Needs CMP and CBC  Past Medical History:  Diagnosis Date   A-fib (HCC)    Alcohol abuse    CHF (congestive heart failure) (HCC)    Chronic respiratory failure with hypoxia (HCC) 05/28/2011   Cor pulmonale, chronic (HCC) February 2013   Degenerative joint disease    Fracture of great toe, left, closed 05/28/2011   Fracture of metatarsal of left foot, closed 05/28/2011   Gastroesophageal reflux disease    Hypertension    Obesity hypoventilation syndrome (HCC) February 2013   Obesity, morbid (more than 100 lbs over ideal weight or BMI > 40) (HCC)    Pickwickian syndrome (HCC)    Umbilical hernia    Urticarial rash 05/31/2011   From Cipro   Vitamin B12 deficiency 05/22/2011    Past Surgical History:  Procedure Laterality Date   CHOLECYSTECTOMY     CYSTOSCOPY     CYSTOSCOPY W/ URETERAL STENT PLACEMENT Bilateral 02/22/2015   Procedure: CYSTOSCOPY WITH BILATERAL STENT EXCHANGE;  Surgeon: Frederick Gauze, MD;  Location: [facility];  Service: Urology;  Laterality: Bilateral;   CYSTOSCOPY WITH HOLMIUM LASER LITHOTRIPSY Bilateral 02/22/2015   Procedure: CYSTOSCOPY WITH HOLMIUM LASER BILATERAL LITHOTRIPSY;  Surgeon: Frederick Gauze, MD;  Location: [facility];  Service: Urology;  Laterality: Bilateral;   CYSTOSCOPY WITH RETROGRADE PYELOGRAM, URETEROSCOPY AND STENT PLACEMENT Bilateral 01/18/2015    Procedure: CYSTOSCOPY WITH BILATERAL RETROGRADE PYELOGRAM, AND BILATERAL STENT PLACEMENT;  Surgeon: Frederick Gauze, MD;  Location: [facility];  Service: Urology;  Laterality: Bilateral;   CYSTOSCOPY/RETROGRADE/URETEROSCOPY/STONE EXTRACTION WITH BASKET Bilateral 02/22/2015   Procedure: CYSTOSCOPY/BILATERAL RETROGRADE/BILATERAL URETEROSCOPY/BILATERAL STONE EXTRACTION WITH BASKET;  Surgeon: Frederick Gauze, MD;  Location: [facility];  Service: Urology;  Laterality: Bilateral;   KNEE SURGERY       Current Outpatient Medications  Medication Sig Dispense Refill   acetaminophen (TYLENOL) 500 MG tablet Take 1,000 mg by mouth every 6 (six) hours as needed for mild pain.     allopurinol (ZYLOPRIM) 300 MG tablet Take 300 mg by mouth daily.     furosemide (LASIX) 80 MG tablet TAKE 1 TABLET BY MOUTH TWICE DAILY. 180 tablet 1   metoprolol tartrate (LOPRESSOR) 25 MG tablet Take 2 tablets (50 mg total) by mouth 2 (two) times daily. 60 tablet 2   omeprazole (PRILOSEC) 20 MG capsule Take 20 mg by mouth every other day.      potassium chloride SA (KLOR-CON) 20 MEQ tablet TAKE 1 TABLET BY MOUTH ONCE DAILY. 90 tablet 3   rivaroxaban (XARELTO) 20 MG TABS tablet Take 1 tablet (20 mg total) by mouth daily with supper. 30 tablet 1   simvastatin (ZOCOR) 10 MG tablet Take 10 mg by mouth daily.     No current facility-administered medications for this visit.    Allergies:   Ciprofloxacin  Social History:  The patient  reports that he quit smoking about 39 years ago. He has never used smokeless tobacco. He reports current alcohol use of about 1.0 standard drink per week. He reports that he does not use drugs.   Family History:  The patient's ***family history includes Heart attack in his father; Heart failure in his father; Hypertension in his father; Stroke in his father.    ROS:  General:no colds or fevers, no weight changes Skin:no rashes or ulcers HEENT:no blurred vision, no congestion CV:see HPI PUL:see  HPI GI:no diarrhea constipation or melena, no indigestion GU:no hematuria, no dysuria MS:no joint pain, no claudication Neuro:no syncope, no lightheadedness Endo:no diabetes, no thyroid disease Wt Readings from Last 3 Encounters:  01/15/19 (!) 366 lb (166 kg)  09/25/17 (!) 369 lb (167.4 kg)  12/31/16 (!) 339 lb (153.8 kg)     PHYSICAL EXAM: VS:  There were no vitals taken for this visit. , BMI There is no height or weight on file to calculate BMI. General:Pleasant affect, NAD Skin:Warm and dry, brisk capillary refill HEENT:normocephalic, sclera clear, mucus membranes moist Neck:supple, no JVD, no bruits  Heart:S1S2 RRR without murmur, gallup, rub or click Lungs:clear without rales, rhonchi, or wheezes [id number], non tender, + BS, do not palpate liver spleen or masses Ext:no lower ext edema, 2+ pedal pulses, 2+ radial pulses Neuro:alert and oriented, MAE, follows commands, + facial symmetry    EKG:  EKG is ordered today. The ekg ordered today demonstrates ***   Recent Labs: No results found for requested labs within last 8760 hours.    Lipid Panel No results found for: CHOL, TRIG, HDL, CHOLHDL, VLDL, LDLCALC, LDLDIRECT     Other studies Reviewed: Additional studies/ records that were reviewed today include: ***.   ASSESSMENT AND PLAN:  1.  ***   Current medicines are reviewed with the patient today.  The patient Has no concerns regarding medicines.  The following changes have been made:  See above Labs/ tests ordered today include:see above  Disposition:   FU:  see above  Signed, Cecilie Kicks, NP  02/09/2021 2:22 PM    [facility], [place], [place], [place] Phone: [phone]; Fax: [phone]

## 2021-02-10 ENCOUNTER — Other Ambulatory Visit: Payer: Self-pay | Admitting: Cardiology

## 2021-02-12 NOTE — Telephone Encounter (Signed)
Prescription refill request for Xarelto received.  Indication: Atrial Fib Last office visit: 01/15/19  Junius Argyle MD Weight: 166kg Age: 70 Scr: 1.3 on 03/02/20 CrCl: 125.92   Based on above findings Xarelto 20mg  daily is the appropriate dose.  Pt is past due for MD appt and Lab work.  Appt made with on 02/13/21.  Will need BMP/CBC at that time.  Refill approved x 1 until appt.

## 2021-02-13 ENCOUNTER — Ambulatory Visit: Payer: Medicare Other | Admitting: Cardiology

## 2021-03-15 ENCOUNTER — Other Ambulatory Visit: Payer: Self-pay | Admitting: Cardiology

## 2021-03-15 NOTE — Telephone Encounter (Signed)
Prescription refill request for Xarelto received.  Indication: Atrial Fib Last office visit: 01/15/19  Frederick Argyle MD Weight: 166kg Age: 70 Scr: 1.3 on 03/02/20 CrCl: 125.92   Based on above findings Xarelto 20mg  daily is the appropriate dose.  Pt is past due for MD appt and Lab work.  Pt cancelled appt with on 02/13/21.  Has rescheduled for 04/09/20.  Will need BMP/CBC at that time.  Refill approved for 30 tablets until appt.

## 2021-04-03 NOTE — Progress Notes (Deleted)
Cardiology Office Note    Date:  04/03/2021   ID:  Frederick Brown, DOB 09-Feb-1951, MRN [id number]   PCP:  Assunta FoundGolding, John, MD   [facility]  Cardiologist:  Prentice DockerSuresh Koneswaran, MD (Inactive) *** Advanced Practice Provider:  No care team member to display Electrophysiologist:  None   [phone]   No chief complaint on file.   History of Present Illness:  Frederick Brown is a 71 y.o. male with a history of permanent atrial fibrillation, hypertension, and chronic diastolic heart failure,    suspected sleep apnea but declined sleep study.  He has a history of medication noncompliance.  He also has a history of morbid obesity and underwent panniculectomy in 2016.  He has a history of significant sleep apnea but did not tolerate CPAP in the past.echocardiogram 01/07/2017 which demonstrated normal left ventricular systolic function, LVEF 50 to 84%55%, with moderate biatrial dilatation.   Patient last had telemedicine visit in 2020 at which time he was stable.  Sleep study recommended but he declined because he would not use CPAP anyway.  Weight loss recommended.   Past Medical History:  Diagnosis Date   A-fib [facility](HCC)    Alcohol abuse    CHF (congestive heart failure) (HCC)    Chronic respiratory failure with hypoxia (HCC) 05/28/2011   Cor pulmonale, chronic (HCC) February 2013   Degenerative joint disease    Fracture of great toe, left, closed 05/28/2011   Fracture of metatarsal of left foot, closed 05/28/2011   Gastroesophageal reflux disease    Hypertension    Obesity hypoventilation syndrome (HCC) February 2013   Obesity, morbid (more than 100 lbs over ideal weight or BMI > 40) (HCC)    Pickwickian syndrome (HCC)    Umbilical hernia    Urticarial rash 05/31/2011   From Cipro   Vitamin B12 deficiency 05/22/2011    Past Surgical History:  Procedure Laterality Date   CHOLECYSTECTOMY     CYSTOSCOPY     CYSTOSCOPY W/ URETERAL STENT PLACEMENT Bilateral 02/22/2015    Procedure: CYSTOSCOPY WITH BILATERAL STENT EXCHANGE;  Surgeon: Malen GauzePatrick L McKenzie, MD;  Location: [facility];  Service: Urology;  Laterality: Bilateral;   CYSTOSCOPY WITH HOLMIUM LASER LITHOTRIPSY Bilateral 02/22/2015   Procedure: CYSTOSCOPY WITH HOLMIUM LASER BILATERAL LITHOTRIPSY;  Surgeon: Malen GauzePatrick L McKenzie, MD;  Location: [facility];  Service: Urology;  Laterality: Bilateral;   CYSTOSCOPY WITH RETROGRADE PYELOGRAM, URETEROSCOPY AND STENT PLACEMENT Bilateral 01/18/2015   Procedure: CYSTOSCOPY WITH BILATERAL RETROGRADE PYELOGRAM, AND BILATERAL STENT PLACEMENT;  Surgeon: Malen GauzePatrick L McKenzie, MD;  Location: [facility];  Service: Urology;  Laterality: Bilateral;   CYSTOSCOPY/RETROGRADE/URETEROSCOPY/STONE EXTRACTION WITH BASKET Bilateral 02/22/2015   Procedure: CYSTOSCOPY/BILATERAL RETROGRADE/BILATERAL URETEROSCOPY/BILATERAL STONE EXTRACTION WITH BASKET;  Surgeon: Malen GauzePatrick L McKenzie, MD;  Location: [facility];  Service: Urology;  Laterality: Bilateral;   KNEE SURGERY      Current Medications: No outpatient medications have been marked as taking for the 04/09/21 encounter (Appointment) with Dyann KiefLenze, Blayde Bacigalupi M, PA-C.     Allergies:   Ciprofloxacin   Social History   Socioeconomic History   Marital status: Divorced    Spouse name: Not on file   Number of children: Not on file   Years of education: Not on file   Highest education level: Not on file  Occupational History   Not on file  Tobacco Use   Smoking status: Former    Types: Cigarettes    Quit date: 04/02/1981    Years since quitting: 40.0  Smokeless tobacco: Never  Vaping Use   Vaping Use: Never used  Substance and Sexual Activity   Alcohol use: Yes    Alcohol/week: 1.0 standard drink    Types: 1 Shots of liquor per week    Comment: occ   Drug use: No   Sexual activity: Not Currently  Other Topics Concern   Not on file  Social History Narrative   Not on file   Social Determinants of Health   Financial Resource Strain: Not on file  Food  Insecurity: Not on file  Transportation Needs: Not on file  Physical Activity: Not on file  Stress: Not on file  Social Connections: Not on file     Family History:  The patient's ***family history includes Heart attack in his father; Heart failure in his father; Hypertension in his father; Stroke in his father.   ROS:   Please see the history of present illness.    ROS All other systems reviewed and are negative.   PHYSICAL EXAM:   VS:  There were no vitals taken for this visit.  Physical Exam  GEN: Well nourished, well developed, in no acute distress  HEENT: normal  Neck: no JVD, carotid bruits, or masses Cardiac:RRR; no murmurs, rubs, or gallops  Respiratory:  clear to auscultation bilaterally, normal work of breathing GI: soft, nontender, nondistended, + BS Ext: without cyanosis, clubbing, or edema, Good distal pulses bilaterally MS: no deformity or atrophy  Skin: warm and dry, no rash Neuro:  Alert and Oriented x 3, Strength and sensation are intact Psych: euthymic mood, full affect  Wt Readings from Last 3 Encounters:  01/15/19 (!) 366 lb (166 kg)  09/25/17 (!) 369 lb (167.4 kg)  12/31/16 (!) 339 lb (153.8 kg)      Studies/Labs Reviewed:   EKG:  EKG is*** ordered today.  The ekg ordered today demonstrates ***  Recent Labs: No results found for requested labs within last 8760 hours.   Lipid Panel No results found for: CHOL, TRIG, HDL, CHOLHDL, VLDL, LDLCALC, LDLDIRECT  Additional studies/ records that were reviewed today include:  Echo 12/2016 Study Conclusions   - Left ventricle: The cavity size was normal. Wall thickness was    normal. Systolic function was normal. The estimated ejection    fraction was in the range of 50% to 55%.  - Left atrium: The atrium was moderately dilated.  - Right atrium: The atrium was moderately dilated.  - Atrial septum: No defect or patent foramen ovale was identified.    -------------------------------------------------------------------  Study data:  Comparison was made to the study of 05/22/2011.  Study  status:  Routine.  Procedure:  The patient reported no pain pre or  post test. Transthoracic echocardiography. Image quality was  adequate.  Study completion:  There were no complications.  Transthoracic echocardiography.  M-mode, complete 2D, spectral  Doppler, and color Doppler.  Birthdate:  Patient birthdate:  [birth year].  Age:  Patient is 71 yr old.  Sex:  Gender: male.  BMI: 48.6 kg/m^2.  Blood pressure:     161/91  Patient status:  Inpatient.  Study date:  Study date: 01/07/2017. Study time: 01:08  PM.  Location:  Echo laboratory.   -------------------------------------------------------------------   -------------------------------------------------------------------  Left ventricle:  The cavity size was normal. Wall thickness was  normal. Systolic function was normal. The estimated ejection  fraction was in the range of 50% to 55%.    Risk Assessment/Calculations:   {Does this patient have ATRIAL FIBRILLATION?[phone]}  ASSESSMENT:    No diagnosis found.   PLAN:  In order of problems listed above: Permanent atrial fibrillation on metoprolol and Xarelto  Chronic diastolic CHF normal LVEF on echo in 2018  Hypertension  Morbid obesity  Suspected sleep apnea   Shared Decision Making/Informed Consent   {Are you ordering a CV Procedure (e.g. stress test, cath, DCCV, TEE, etc)?   Press F2        :[phone]}    Medication Adjustments/Labs and Tests Ordered: Current medicines are reviewed at length with the patient today.  Concerns regarding medicines are outlined above.  Medication changes, Labs and Tests ordered today are listed in the Patient Instructions below. There are no Patient Instructions on file for this visit.   Elson Clan, PA-C  04/03/2021 12:43 PM    [facility] [address] [place], [place], [place]  [address] Phone: [phone]; Fax: [phone]

## 2021-04-06 DIAGNOSIS — G894 Chronic pain syndrome: Secondary | ICD-10-CM | POA: Diagnosis not present

## 2021-04-06 DIAGNOSIS — E782 Mixed hyperlipidemia: Secondary | ICD-10-CM | POA: Diagnosis not present

## 2021-04-06 DIAGNOSIS — E669 Obesity, unspecified: Secondary | ICD-10-CM | POA: Diagnosis not present

## 2021-04-06 DIAGNOSIS — E559 Vitamin D deficiency, unspecified: Secondary | ICD-10-CM | POA: Diagnosis not present

## 2021-04-09 ENCOUNTER — Ambulatory Visit: Payer: Medicare Other | Admitting: Physician Assistant

## 2021-04-09 DIAGNOSIS — I4821 Permanent atrial fibrillation: Secondary | ICD-10-CM

## 2021-04-09 DIAGNOSIS — I10 Essential (primary) hypertension: Secondary | ICD-10-CM

## 2021-04-09 DIAGNOSIS — I5032 Chronic diastolic (congestive) heart failure: Secondary | ICD-10-CM

## 2021-04-09 DIAGNOSIS — R29818 Other symptoms and signs involving the nervous system: Secondary | ICD-10-CM

## 2021-04-10 DIAGNOSIS — E7849 Other hyperlipidemia: Secondary | ICD-10-CM | POA: Diagnosis not present

## 2021-04-10 DIAGNOSIS — E559 Vitamin D deficiency, unspecified: Secondary | ICD-10-CM | POA: Diagnosis not present

## 2021-04-18 ENCOUNTER — Telehealth: Payer: Self-pay | Admitting: Student

## 2021-04-18 NOTE — Telephone Encounter (Signed)
Left a message for patient to call office back regarding Xarelto refill.

## 2021-04-18 NOTE — Telephone Encounter (Signed)
Patient is returning CMA's call.  ?

## 2021-04-18 NOTE — Telephone Encounter (Signed)
°*  STAT* If patient is at the pharmacy, call can be transferred to refill team.   1. Which medications need to be refilled? (please list name of each medication and dose if known) XARELTO 20 MG TABS tablet  2. Which pharmacy/location (including street and city if local pharmacy) is medication to be sent to? [facility] [phone] - , [place] - [address] AT [facility] [address]  3. Do they need a 30 day or 90 day supply? 30 day

## 2021-04-18 NOTE — Telephone Encounter (Signed)
Prescription refill request for Xarelto received.  Indication: Atrial Fib Last office visit: 01/15/19  Junius Argyle MD Weight: 166kg Age: 71 Scr: 1.3 on 03/02/20 CrCl: 125.92   Based on above findings Xarelto 20mg  daily is the appropriate dose.  Pt is past due for MD appt and Lab work.  He has not been seen since 01/15/2019.  He has cancelled the last 5 MD appts and has NOT had lab work needed for Xarelto refills.  Refill denied.  Message sent to nurse to reschedule pt aolng with CBC/BMP.

## 2021-04-19 MED ORDER — RIVAROXABAN 20 MG PO TABS: ORAL_TABLET | ORAL | 0 refills | Status: DC

## 2021-04-19 NOTE — Telephone Encounter (Signed)
If provider is good with Virtual visit it is OK with me.

## 2021-04-19 NOTE — Telephone Encounter (Signed)
Pt has VV with P. Nishan on 05/20/2021.

## 2021-04-19 NOTE — Telephone Encounter (Signed)
Pt has not been seen since 2020, needs f/u for Xarelto refills. Please advise if pt can have VV for refill f/u. Former SK pt

## 2021-04-19 NOTE — Telephone Encounter (Signed)
Spoke to pt who stated that it is dfficult to go to appts. Pt is able to have carside appt's with PCP, but not at [facility]. Pt would like to know if he can schedule a VV for refills. Pt had lab work done last week. Will request from PCP's office.

## 2021-05-01 DIAGNOSIS — I509 Heart failure, unspecified: Secondary | ICD-10-CM | POA: Diagnosis not present

## 2021-05-01 DIAGNOSIS — I10 Essential (primary) hypertension: Secondary | ICD-10-CM | POA: Diagnosis not present

## 2021-05-01 DIAGNOSIS — N182 Chronic kidney disease, stage 2 (mild): Secondary | ICD-10-CM | POA: Diagnosis not present

## 2021-05-10 NOTE — Progress Notes (Addendum)
CARDIOLOGY CONSULT NOTE     Virtual Visit via Telephone  Note   This visit type was conducted due to national recommendations for restrictions regarding the COVID-19 Pandemic (e.g. social distancing) in an effort to limit this patient's exposure and mitigate transmission in our community.  Due to her co-morbid illnesses, this patient is at least at moderate risk for complications without adequate follow up.  This format is felt to be most appropriate for this patient at this time.  All issues noted in this document were discussed and addressed.  A limited physical exam was performed with this format.  Please refer to the patient's chart for her consent to telehealth for [facility].   Patient location Home Physician location Office   Patient ID: Frederick Brown MRN: [id number] DOB/AGE: September 29, 1950 71 y.o.  Referring Physician: Hilma Brown Primary Physician: Frederick Sites, MD Primary Cardiologist: Frederick Brown Reason for Consultation: Anticoagulation   HPI:  71 y.o. referred by Dr Frederick Brown Needs cardiology visit to prescribe further xarelto. Last seen by DR Frederick Brown 3 years ago He has chronic afib , HTN, morbid obesity with panniculectomy in 2016 and OSA intolerant to CPAP Previous history of ETOH abuse He has been maintained on lopressor and xarelto No bleeding issues He takes lasix for his LE edema  Labs with primary 04/11/21 reviewed Normal CBC/PLT and BMET normal TSH LDL 133   He admits to 2-3 vodka/OJ drinks per day Discussed long term risks of this Discussed seeing if Eliquis is cheaper for him than xarelto  Still very sedentary No bleeding issues   ROS All other systems reviewed and negative except as noted above  Past Medical History:  Diagnosis Date   A-fib ([place])    Alcohol abuse    CHF (congestive heart failure) ([place])    Chronic respiratory failure with hypoxia ([place]) 05/28/2011   Cor pulmonale, chronic ([place]) February 2013   Degenerative joint disease    Fracture of great toe,  left, closed 05/28/2011   Fracture of metatarsal of left foot, closed 05/28/2011   Gastroesophageal reflux disease    Hypertension    Obesity hypoventilation syndrome ([place]) February 2013   Obesity, morbid (more than 100 lbs over ideal weight or BMI > 40) (HCC)    Pickwickian syndrome (HCC)    Umbilical hernia    Urticarial rash 05/31/2011   From Cipro   Vitamin B12 deficiency 05/22/2011    Family History  Problem Relation Age of Onset   Heart attack Father    Heart failure Father    Stroke Father    Hypertension Father     Social History   Socioeconomic History   Marital status: Divorced    Spouse name: Not on file   Number of children: Not on file   Years of education: Not on file   Highest education level: Not on file  Occupational History   Not on file  Tobacco Use   Smoking status: Former    Types: Cigarettes    Quit date: 04/02/1981    Years since quitting: 40.1   Smokeless tobacco: Never  Vaping Use   Vaping Use: Never used  Substance and Sexual Activity   Alcohol use: Yes    Alcohol/week: 1.0 standard drink    Types: 1 Shots of liquor per week    Comment: occ   Drug use: No   Sexual activity: Not Currently  Other Topics Concern   Not on file  Social History Narrative   Not on file   Social  Determinants of Health   Financial Resource Strain: Not on file  Food Insecurity: Not on file  Transportation Needs: Not on file  Physical Activity: Not on file  Stress: Not on file  Social Connections: Not on file  Intimate Partner Violence: Not on file    Past Surgical History:  Procedure Laterality Date   CHOLECYSTECTOMY     CYSTOSCOPY     CYSTOSCOPY W/ URETERAL STENT PLACEMENT Bilateral 02/22/2015   Procedure: CYSTOSCOPY WITH BILATERAL STENT EXCHANGE;  Surgeon: Frederick Gustin, MD;  Location: [facility];  Service: Urology;  Laterality: Bilateral;   CYSTOSCOPY WITH HOLMIUM LASER LITHOTRIPSY Bilateral 02/22/2015   Procedure: CYSTOSCOPY WITH HOLMIUM LASER BILATERAL  LITHOTRIPSY;  Surgeon: Frederick Gustin, MD;  Location: [facility];  Service: Urology;  Laterality: Bilateral;   CYSTOSCOPY WITH RETROGRADE PYELOGRAM, URETEROSCOPY AND STENT PLACEMENT Bilateral 01/18/2015   Procedure: CYSTOSCOPY WITH BILATERAL RETROGRADE PYELOGRAM, AND BILATERAL STENT PLACEMENT;  Surgeon: Frederick Gustin, MD;  Location: [facility];  Service: Urology;  Laterality: Bilateral;   CYSTOSCOPY/RETROGRADE/URETEROSCOPY/STONE EXTRACTION WITH BASKET Bilateral 02/22/2015   Procedure: CYSTOSCOPY/BILATERAL RETROGRADE/BILATERAL URETEROSCOPY/BILATERAL STONE EXTRACTION WITH BASKET;  Surgeon: Frederick Gustin, MD;  Location: [facility];  Service: Urology;  Laterality: Bilateral;   KNEE SURGERY        Current Outpatient Medications:    acetaminophen (TYLENOL) 500 MG tablet, Take 1,000 mg by mouth every 6 (six) hours as needed for mild pain., Disp: , Rfl:    allopurinol (ZYLOPRIM) 300 MG tablet, Take 300 mg by mouth daily., Disp: , Rfl:    furosemide (LASIX) 80 MG tablet, TAKE 1 TABLET BY MOUTH TWICE DAILY., Disp: 180 tablet, Rfl: 1   metoprolol tartrate (LOPRESSOR) 25 MG tablet, Take 2 tablets (50 mg total) by mouth 2 (two) times daily., Disp: 60 tablet, Rfl: 2   omeprazole (PRILOSEC) 20 MG capsule, Take 20 mg by mouth every other day. , Disp: , Rfl:    potassium chloride SA (KLOR-CON) 20 MEQ tablet, TAKE 1 TABLET BY MOUTH ONCE DAILY., Disp: 90 tablet, Rfl: 3   rivaroxaban (XARELTO) 20 MG TABS tablet, TAKE 1 TABLET BY MOUTH DAILY WITH SUPPER. SCHEDULE APPOINTMENT WITH MD, Disp: 30 tablet, Rfl: 0   simvastatin (ZOCOR) 10 MG tablet, Take 10 mg by mouth daily., Disp: , Rfl:     Physical Exam: There were no vitals taken for this visit.    Telephone no exam    Labs:   Lab Results  Component Value Date   WBC 6.1 09/25/2017   HGB 14.8 09/25/2017   HCT 46.0 09/25/2017   MCV 97.5 09/25/2017   PLT 186 09/25/2017   No results for input(s): NA, K, CL, CO2, BUN, CREATININE, CALCIUM, PROT, BILITOT,  ALKPHOS, ALT, AST, GLUCOSE in the last 168 hours.  Invalid input(s): LABALBU Lab Results  Component Value Date   CKTOTAL 15 (L) 01/25/2015   CKMB 1.9 05/22/2011   TROPONINI <0.03 02/23/2015   No results found for: CHOL No results found for: HDL No results found for: LDLCALC No results found for: TRIG No results found for: CHOLHDL No results found for: LDLDIRECT    Radiology: No results found.  EKG: 2019 afib rate 76 low voltage    ASSESSMENT AND PLAN:   Afib:  chronic good rate control and anticoagulation Labs with primary ok No bleeding issues  OSA:  f/u primary too heavy for Inspiris device and will not wear CPAP HLD:  no documented vascular dx continue zocor Edema: dependant from obesity on lasix  F/U in a year   Time spent reviewing chart notes from primary /Dr [facility] lab review, ECG direct patient interview and composing note 25 minutes   Signed: Jenkins Rouge 05/10/2021, 12:09 PM

## 2021-05-17 ENCOUNTER — Other Ambulatory Visit: Payer: Self-pay

## 2021-05-17 ENCOUNTER — Encounter: Payer: Self-pay | Admitting: Cardiovascular Disease

## 2021-05-17 ENCOUNTER — Ambulatory Visit (INDEPENDENT_AMBULATORY_CARE_PROVIDER_SITE_OTHER): Payer: Medicare Other | Admitting: Cardiovascular Disease

## 2021-05-17 VITALS — Ht 70.0 in | Wt 366.0 lb

## 2021-05-17 DIAGNOSIS — I482 Chronic atrial fibrillation, unspecified: Secondary | ICD-10-CM

## 2021-05-17 DIAGNOSIS — R609 Edema, unspecified: Secondary | ICD-10-CM

## 2021-05-17 DIAGNOSIS — E782 Mixed hyperlipidemia: Secondary | ICD-10-CM | POA: Diagnosis not present

## 2021-05-17 DIAGNOSIS — Z5181 Encounter for therapeutic drug level monitoring: Secondary | ICD-10-CM | POA: Diagnosis not present

## 2021-05-17 DIAGNOSIS — G4733 Obstructive sleep apnea (adult) (pediatric): Secondary | ICD-10-CM | POA: Diagnosis not present

## 2021-05-17 DIAGNOSIS — Z7901 Long term (current) use of anticoagulants: Secondary | ICD-10-CM

## 2021-05-17 MED ORDER — RIVAROXABAN 20 MG PO TABS: ORAL_TABLET | ORAL | 11 refills | Status: DC

## 2021-05-17 NOTE — Patient Instructions (Signed)
Medication Instructions:  Your physician recommends that you continue on your current medications as directed. Please refer to the Current Medication list given to you today.  *If you need a refill on your cardiac medications before your next appointment, please call your pharmacy*   Lab Work: NONE   If you have labs (blood work) drawn today and your tests are completely normal, you will receive your results only by: . MyChart Message (if you have MyChart) OR . A paper copy in the mail If you have any lab test that is abnormal or we need to change your treatment, we will call you to review the results.   Testing/Procedures: NONE    Follow-Up: At [facility], you and your health needs are our priority.  As part of our continuing mission to provide you with exceptional heart care, we have created designated Provider Care Teams.  These Care Teams include your primary Cardiologist (physician) and Advanced Practice Providers (APPs -  Physician Assistants and Nurse Practitioners) who all work together to provide you with the care you need, when you need it.  We recommend signing up for the patient portal called "MyChart".  Sign up information is provided on this After Visit Summary.  MyChart is used to connect with patients for Virtual Visits (Telemedicine).  Patients are able to view lab/test results, encounter notes, upcoming appointments, etc.  Non-urgent messages can be sent to your provider as well.   To learn more about what you can do with MyChart, go to [email or web address].    Your next appointment:   1 year(s)  The format for your next appointment:   In Person  Provider:   Peter Nishan, MD   Other Instructions Thank you for choosing [facility]!    

## 2021-05-17 NOTE — Addendum Note (Signed)
Addended by: Kerney Elbe on: 05/17/2021 10:31 AM   Modules accepted: Orders

## 2021-10-29 DIAGNOSIS — E782 Mixed hyperlipidemia: Secondary | ICD-10-CM | POA: Diagnosis not present

## 2021-10-29 DIAGNOSIS — N182 Chronic kidney disease, stage 2 (mild): Secondary | ICD-10-CM | POA: Diagnosis not present

## 2021-10-29 DIAGNOSIS — I10 Essential (primary) hypertension: Secondary | ICD-10-CM | POA: Diagnosis not present

## 2021-11-29 DIAGNOSIS — E782 Mixed hyperlipidemia: Secondary | ICD-10-CM | POA: Diagnosis not present

## 2021-11-29 DIAGNOSIS — I10 Essential (primary) hypertension: Secondary | ICD-10-CM | POA: Diagnosis not present

## 2021-11-29 DIAGNOSIS — N182 Chronic kidney disease, stage 2 (mild): Secondary | ICD-10-CM | POA: Diagnosis not present

## 2022-01-29 DIAGNOSIS — I10 Essential (primary) hypertension: Secondary | ICD-10-CM | POA: Diagnosis not present

## 2022-01-29 DIAGNOSIS — E782 Mixed hyperlipidemia: Secondary | ICD-10-CM | POA: Diagnosis not present

## 2022-01-29 DIAGNOSIS — N182 Chronic kidney disease, stage 2 (mild): Secondary | ICD-10-CM | POA: Diagnosis not present

## 2022-03-21 DIAGNOSIS — G629 Polyneuropathy, unspecified: Secondary | ICD-10-CM | POA: Diagnosis not present

## 2022-03-21 DIAGNOSIS — I10 Essential (primary) hypertension: Secondary | ICD-10-CM | POA: Diagnosis not present

## 2022-03-21 DIAGNOSIS — I4891 Unspecified atrial fibrillation: Secondary | ICD-10-CM | POA: Diagnosis not present

## 2022-03-21 DIAGNOSIS — E559 Vitamin D deficiency, unspecified: Secondary | ICD-10-CM | POA: Diagnosis not present

## 2022-05-01 DIAGNOSIS — N182 Chronic kidney disease, stage 2 (mild): Secondary | ICD-10-CM | POA: Diagnosis not present

## 2022-05-01 DIAGNOSIS — I10 Essential (primary) hypertension: Secondary | ICD-10-CM | POA: Diagnosis not present

## 2022-05-01 DIAGNOSIS — E782 Mixed hyperlipidemia: Secondary | ICD-10-CM | POA: Diagnosis not present

## 2022-05-08 DIAGNOSIS — H52223 Regular astigmatism, bilateral: Secondary | ICD-10-CM | POA: Diagnosis not present

## 2022-05-23 ENCOUNTER — Emergency Department (HOSPITAL_COMMUNITY): Payer: Medicare Other

## 2022-05-23 ENCOUNTER — Encounter (HOSPITAL_COMMUNITY): Payer: Self-pay

## 2022-05-23 ENCOUNTER — Other Ambulatory Visit: Payer: Self-pay

## 2022-05-23 ENCOUNTER — Emergency Department (HOSPITAL_COMMUNITY)
Admission: EM | Admit: 2022-05-23 | Discharge: 2022-05-27 | Disposition: A | Discharge status: Home / Self Care | Payer: Medicare Other | Attending: Emergency Medicine | Admitting: Emergency Medicine

## 2022-05-23 DIAGNOSIS — S82192A Other fracture of upper end of left tibia, initial encounter for closed fracture: Secondary | ICD-10-CM | POA: Diagnosis not present

## 2022-05-23 DIAGNOSIS — M7989 Other specified soft tissue disorders: Secondary | ICD-10-CM | POA: Insufficient documentation

## 2022-05-23 DIAGNOSIS — S82832A Other fracture of upper and lower end of left fibula, initial encounter for closed fracture: Secondary | ICD-10-CM | POA: Diagnosis not present

## 2022-05-23 DIAGNOSIS — I4891 Unspecified atrial fibrillation: Secondary | ICD-10-CM | POA: Insufficient documentation

## 2022-05-23 DIAGNOSIS — Y92810 Car as the place of occurrence of the external cause: Secondary | ICD-10-CM | POA: Insufficient documentation

## 2022-05-23 DIAGNOSIS — Z7901 Long term (current) use of anticoagulants: Secondary | ICD-10-CM | POA: Insufficient documentation

## 2022-05-23 DIAGNOSIS — Z79899 Other long term (current) drug therapy: Secondary | ICD-10-CM | POA: Insufficient documentation

## 2022-05-23 DIAGNOSIS — W19XXXA Unspecified fall, initial encounter: Secondary | ICD-10-CM | POA: Insufficient documentation

## 2022-05-23 DIAGNOSIS — M25462 Effusion, left knee: Secondary | ICD-10-CM

## 2022-05-23 DIAGNOSIS — M79605 Pain in left leg: Secondary | ICD-10-CM | POA: Diagnosis not present

## 2022-05-23 DIAGNOSIS — I959 Hypotension, unspecified: Secondary | ICD-10-CM | POA: Diagnosis not present

## 2022-05-23 DIAGNOSIS — I110 Hypertensive heart disease with heart failure: Secondary | ICD-10-CM | POA: Diagnosis not present

## 2022-05-23 DIAGNOSIS — I509 Heart failure, unspecified: Secondary | ICD-10-CM | POA: Diagnosis not present

## 2022-05-23 DIAGNOSIS — S8992XA Unspecified injury of left lower leg, initial encounter: Secondary | ICD-10-CM | POA: Diagnosis not present

## 2022-05-23 DIAGNOSIS — M25562 Pain in left knee: Secondary | ICD-10-CM | POA: Diagnosis not present

## 2022-05-23 LAB — BASIC METABOLIC PANEL
Anion gap: 15 (ref 5–15)
BUN: 18 mg/dL (ref 8–23)
CO2: 26 mmol/L (ref 22–32)
Calcium: 9.9 mg/dL (ref 8.9–10.3)
Chloride: 98 mmol/L (ref 98–111)
Creatinine, Ser: 1.35 mg/dL — ABNORMAL HIGH (ref 0.61–1.24)
GFR, Estimated: 56 mL/min — ABNORMAL LOW (ref >60)
Glucose, Bld: 135 mg/dL — ABNORMAL HIGH (ref 70–99)
Potassium: 3.4 mmol/L — ABNORMAL LOW (ref 3.5–5.1)
Sodium: 139 mmol/L (ref 135–145)

## 2022-05-23 LAB — CBC WITH DIFFERENTIAL/PLATELET
Abs Immature Granulocytes: 0.03 10*3/uL (ref 0.00–0.07)
Basophils Absolute: 0.1 10*3/uL (ref 0.0–0.1)
Basophils Relative: 1 %
Eosinophils Absolute: 0 10*3/uL (ref 0.0–0.5)
Eosinophils Relative: 0 %
HCT: 36.3 % — ABNORMAL LOW (ref 39.0–52.0)
Hemoglobin: 11 g/dL — ABNORMAL LOW (ref 13.0–17.0)
Immature Granulocytes: 0 %
Lymphocytes Relative: 9 %
Lymphs Abs: 0.9 10*3/uL (ref 0.7–4.0)
MCH: 29 pg (ref 26.0–34.0)
MCHC: 30.3 g/dL (ref 30.0–36.0)
MCV: 95.8 fL (ref 80.0–100.0)
Monocytes Absolute: 0.7 10*3/uL (ref 0.1–1.0)
Monocytes Relative: 7 %
Neutro Abs: 8.4 10*3/uL — ABNORMAL HIGH (ref 1.7–7.7)
Neutrophils Relative %: 83 %
Platelets: 242 10*3/uL (ref 150–400)
RBC: 3.79 MIL/uL — ABNORMAL LOW (ref 4.22–5.81)
RDW: 15.3 % (ref 11.5–15.5)
WBC: 10 10*3/uL (ref 4.0–10.5)
nRBC: 0 % (ref 0.0–0.2)

## 2022-05-23 MED ORDER — ROSUVASTATIN CALCIUM 20 MG PO TABS
20.0000 mg | ORAL_TABLET | Freq: Every day | ORAL | Status: DC
  Administered 2022-05-23 – 2022-05-26 (×4): 20 mg via ORAL
  Filled 2022-05-23 (×5): qty 1

## 2022-05-23 MED ORDER — FUROSEMIDE 40 MG PO TABS
80.0000 mg | ORAL_TABLET | Freq: Two times a day (BID) | ORAL | Status: DC
  Administered 2022-05-23 – 2022-05-27 (×8): 80 mg via ORAL
  Filled 2022-05-23 (×8): qty 2

## 2022-05-23 MED ORDER — ACETAMINOPHEN 500 MG PO TABS
1000.0000 mg | ORAL_TABLET | Freq: Four times a day (QID) | ORAL | Status: DC | PRN
  Administered 2022-05-24 – 2022-05-27 (×2): 1000 mg via ORAL
  Filled 2022-05-23 (×3): qty 2

## 2022-05-23 MED ORDER — POTASSIUM CHLORIDE CRYS ER 20 MEQ PO TBCR
20.0000 meq | EXTENDED_RELEASE_TABLET | Freq: Every day | ORAL | Status: DC
  Administered 2022-05-23 – 2022-05-27 (×5): 20 meq via ORAL
  Filled 2022-05-23 (×5): qty 1

## 2022-05-23 MED ORDER — METOPROLOL TARTRATE 50 MG PO TABS
50.0000 mg | ORAL_TABLET | Freq: Two times a day (BID) | ORAL | Status: DC
  Administered 2022-05-23 – 2022-05-27 (×7): 50 mg via ORAL
  Filled 2022-05-23 (×8): qty 1

## 2022-05-23 MED ORDER — PANTOPRAZOLE SODIUM 40 MG PO TBEC
40.0000 mg | DELAYED_RELEASE_TABLET | Freq: Every day | ORAL | Status: DC
  Administered 2022-05-24 – 2022-05-27 (×4): 40 mg via ORAL
  Filled 2022-05-23 (×5): qty 1

## 2022-05-23 MED ORDER — OXYCODONE-ACETAMINOPHEN 5-325 MG PO TABS
1.0000 | ORAL_TABLET | Freq: Once | ORAL | Status: AC
  Administered 2022-05-23: 1 via ORAL
  Filled 2022-05-23: qty 1

## 2022-05-23 MED ORDER — OXYCODONE-ACETAMINOPHEN 5-325 MG PO TABS
1.0000 | ORAL_TABLET | Freq: Four times a day (QID) | ORAL | Status: DC | PRN
  Administered 2022-05-23: 1 via ORAL
  Filled 2022-05-23: qty 1

## 2022-05-23 MED ORDER — RIVAROXABAN 20 MG PO TABS
20.0000 mg | ORAL_TABLET | Freq: Every day | ORAL | Status: DC
  Administered 2022-05-23 – 2022-05-26 (×4): 20 mg via ORAL
  Filled 2022-05-23 (×5): qty 1

## 2022-05-23 NOTE — ED Provider Notes (Signed)
[place] Provider Note   CSN: [id number] Arrival date & time: 05/23/22  1119     History  Chief Complaint  Patient presents with   Knee Injury    RAYMEN TONDRE is a 72 y.o. male with history including hypertension, CHF, GERD, cor pulmonale with pickwickian syndrome, history of atrial fibrillation on Xarelto presenting for evaluation of left lower extremity pain which occurred suddenly 2 days ago when he was weightbearing on the left leg while he was trying to pivot into his vehicle.  Patient relies heavily on a wheelchair at his home, describes having a handicap assessable Lucianne Lei but has to pivot from his wheelchair to get into the vehicle.  Since this event he has had severe pain in his left knee and upper leg along with increased swelling of the lower extremity and pain with attempts at weightbearing.  He has taken Tylenol without symptom relief.  The history is provided by the patient.       Home Medications Prior to Admission medications   Medication Sig Start Date End Date Taking? Authorizing Provider  acetaminophen (TYLENOL) 500 MG tablet Take 1,000 mg by mouth every 6 (six) hours as needed for mild pain.    [provider]  allopurinol (ZYLOPRIM) 300 MG tablet Take 300 mg by mouth daily.    [provider]  furosemide (LASIX) 80 MG tablet TAKE 1 TABLET BY MOUTH TWICE DAILY. Patient taking differently: 40 mg. Taking 80 in the am and taking 40 in the pm 08/13/19   Herminio Commons, MD  metoprolol tartrate (LOPRESSOR) 25 MG tablet Take 2 tablets (50 mg total) by mouth 2 (two) times daily. 02/25/15   Orvan Falconer, MD  omeprazole (PRILOSEC) 20 MG capsule Take 20 mg by mouth every other day.    [provider]  potassium chloride SA (KLOR-CON) 20 MEQ tablet TAKE 1 TABLET BY MOUTH ONCE DAILY. Patient not taking: Reported on 05/17/2021 07/26/19   Herminio Commons, MD  rivaroxaban (XARELTO) 20 MG TABS tablet  TAKE 1 TABLET BY MOUTH DAILY WITH SUPPER. SCHEDULE APPOINTMENT WITH MD 05/17/21   Josue Hector, MD  rosuvastatin (CRESTOR) 20 MG tablet Take 20 mg by mouth daily. 04/19/21   [provider]      Allergies    Patient has no known allergies.    Review of Systems   Review of Systems  Constitutional:  Negative for fever.  Musculoskeletal:  Positive for arthralgias and joint swelling. Negative for myalgias.  Neurological:  Negative for weakness and numbness.  All other systems reviewed and are negative.   Physical Exam Updated Vital Signs BP 136/75 (BP Location: Right Arm)   Pulse 95   Temp 98 F (36.7 C) (Oral)   Resp 18   Ht '5\' 10"'$  (1.778 m)   Wt (!) 166 kg   SpO2 98%   BMI 52.52 kg/m  Physical Exam Constitutional:      Appearance: He is well-developed.  HENT:     Head: Atraumatic.  Cardiovascular:     Comments: Pulses equal bilaterally Musculoskeletal:        General: Tenderness present.     Cervical back: Normal range of motion.     Left knee: Swelling and bony tenderness present.  Skin:    General: Skin is warm and dry.  Neurological:     Mental Status: He is alert.     Sensory: No sensory deficit.     Motor: No  weakness.     Deep Tendon Reflexes: Reflexes normal.     ED Results / Procedures / Treatments   Labs (all labs ordered are listed, but only abnormal results are displayed) Labs Reviewed  CBC WITH DIFFERENTIAL/PLATELET - Abnormal; Notable for the following components:      Result Value   RBC 3.79 (*)    Hemoglobin 11.0 (*)    HCT 36.3 (*)    Neutro Abs 8.4 (*)    All other components within normal limits  BASIC METABOLIC PANEL - Abnormal; Notable for the following components:   Potassium 3.4 (*)    Glucose, Bld 135 (*)    Creatinine, Ser 1.35 (*)    GFR, Estimated 56 (*)    All other components within normal limits    EKG None  Radiology DG Foot Complete Left  Result Date: 05/23/2022 CLINICAL DATA:  Pain EXAM: LEFT FOOT -  COMPLETE 3+ VIEW COMPARISON:  05/27/2011 FINDINGS: Diffuse bone demineralization. Prominent degenerative changes in the interphalangeal joints and intertarsal joints. Degenerative changes in the tibiotalar joint. Large plantar calcaneal spur. There appears to be an old ununited fracture deformity at the base of the proximal phalanx of the left first toe. Periarticular bone erosion suggested at the third and fourth metatarsal heads and possibly at the third and fourth proximal phalanges at the proximal interphalangeal joints. Changes suggest erosive arthritis, possibly rheumatoid arthritis, possibly gout, possibly septic arthritis if in the appropriate clinical setting. Diffuse soft tissue swelling. No radiopaque soft tissue foreign bodies or soft tissue gas collections. Prominent vascular calcifications. Hallux valgus deformity. IMPRESSION: Diffuse bone demineralization. Diffuse degenerative changes. Erosive arthritic changes suggested in the third and fourth metatarsal-phalangeal and proximal interphalangeal joints. No definite evidence of acute fracture or dislocation. Electronically Signed   By: Lucienne Capers M.D.   On: 05/23/2022 17:50   CT Knee Left Wo Contrast  Result Date: 05/23/2022 CLINICAL DATA:  [place] 2 days ago.  Left knee pain. EXAM: CT OF THE LEFT KNEE WITHOUT CONTRAST TECHNIQUE: Multidetector CT imaging of the left knee was performed according to the standard protocol. Multiplanar CT image reconstructions were also generated. RADIATION DOSE REDUCTION: This exam was performed according to the departmental dose-optimization program which includes automated exposure control, adjustment of the mA and/or kV according to patient size and/or use of iterative reconstruction technique. COMPARISON:  Radiographs, same date. FINDINGS: As demonstrated on the plain films there is a nondisplaced fracture of the left fibular neck. The femur and patella are intact. No definite tibial fracture. The lateral and  medial tibial plateaus are maintained. Severe degenerative changes severe osteoporosis and body habitus limit this examination. Loose ossified bodies are noted in the joint which could suggest synovial osteochondromatosis. Moderate-sized knee joint effusion. Advanced fatty atrophy of the the musculature. IMPRESSION: 1. Nondisplaced fracture of the left fibular neck. 2. No definite fractures of the tibia, femur or patella. 3. Severe degenerative changes and severe osteoporosis and body habitus limit this examination. 4. Loose ossified bodies in the joint could suggest synovial osteochondromatosis. 5. Moderate-sized knee joint effusion. 6. Advanced fatty atrophy of the musculature. Electronically Signed   By: Marijo Sanes M.D.   On: 05/23/2022 16:30   DG Knee Complete 4 Views Left  Result Date: 05/23/2022 CLINICAL DATA:  Fall.  Pain. EXAM: LEFT KNEE - COMPLETE 4+ VIEW COMPARISON:  Tibia and fibula films of same date FINDINGS: Limitations secondary to patient body habitus. Moderate to severe medial and mild lateral/patellofemoral compartment osteoarthritis. Fibular neck fracture again  identified. Oblique fracture involving the lateral tibial plateau contacting the articular surface at the lateral most aspect of the medial tibial plateau, just adjacent to the medial tibial spine. Vascular calcifications. Probable joint effusion, suboptimally evaluated secondary to patient body habitus. IMPRESSION: Patient body habitus degraded exam. Oblique fracture of the proximal lateral tibia traversing the joint surface in the lateral aspect of the medial tibial plateau. Fibular neck fracture. Underlying osteoarthritis. CT may be informative to further evaluate the extent of articular step-off. Electronically Signed   By: Abigail Miyamoto M.D.   On: 05/23/2022 14:22   DG Tibia/Fibula Left  Result Date: 05/23/2022 CLINICAL DATA:  Pain after fall EXAM: LEFT TIBIA AND FIBULA - 2 VIEW COMPARISON:  None Available. FINDINGS:  Osteopenia. Nondisplaced fibular neck fracture. No additional fracture or dislocation. Degenerative changes are seen to the ankle and the knee joint adjacent. Scattered vascular and soft tissue calcifications. Diffuse soft tissue swelling. Question deformity of the tibial plateau. IMPRESSION: Severe osteopenia.  Nondisplaced fibular neck fracture. Question deformity of the tibial plateau. Recommend dedicated knee x-rays when appropriate Multifocal degenerative changes.  Soft tissue swelling. Electronically Signed   By: Jill Side M.D.   On: 05/23/2022 13:01    Procedures Procedures    Medications Ordered in ED Medications  oxyCODONE-acetaminophen (PERCOCET/ROXICET) 5-325 MG per tablet 1 tablet (1 tablet Oral Given 05/23/22 1339)    ED Course/ Medical Decision Making/ A&P                             Medical Decision Making Patient presenting with a plant and pivot type injury of the left knee with severe pain and inability to weight-bear.  Plain film imaging positive for a proximal fibular fracture and a tibial plateau fracture.  CT imaging is currently pending, however I spoke with Dr.Cairns of orthopedics, and unless the CT shows more extensive fracture through the tibial plateau toe than the plain film suggest, he will probably not be a surgical candidate rather knee immobilizer and nonweightbearing.  Discussed this possibility with patient who expressed great concern over being able to take care of himself in his home.  He lives alone, uses a lift chair, walker and wheelchair for ADLs, but does not feel he would be able to care for himself with this new injury.  If he is not admitted to the hospital, he will need rehab placement.  I have placed a consult to [facility] to explore options.  Once CT resulted,  no surgical need for admission, PT eval also requested.  Pending at this time.   Amount and/or Complexity of Data Reviewed Labs: ordered.    Details: Basic labs obtained including results  significant for a glucose of 135, he also has a creatinine 1.35, there are no recent labs in this patient's chart for meaningful comparison.  He also has a hemoglobin of 11.0, his last hemoglobin was 14.8  4 years ago. Radiology: ordered.    Details: Plain film imaging reviewed, results above.  CT of the left knee confirming effusion but no obvious tibial plateau fracture. Discussion of management or test interpretation with external provider(s): Patient was discussed with Dr. Amedeo Kinsman, after plain film imaging was obtained.  He was messaged with CT imaging results.  Patient will be a known surgical patient, he will not need inpatient it admission but will need rehab assistance while his injury heals.  He is placed in a knee immobilizer.  Pending full PT eval  and TOC involvement at this time.  Risk Prescription drug management. Diagnosis or treatment significantly limited by social determinants of health.           Final Clinical Impression(s) / ED Diagnoses Final diagnoses:  Other closed fracture of proximal end of left fibula, initial encounter  Effusion of left knee    Rx / DC Orders ED Discharge Orders     None         Landis Martins 05/23/22 1819    Milton Ferguson, MD 05/24/22 1104

## 2022-05-23 NOTE — ED Notes (Signed)
Patient transported to CT 

## 2022-05-23 NOTE — ED Notes (Signed)
CSW consulted for SNF placement. CSW updated treatment team that a PT order will need to be placed. TOC cannot work up SNF referral until PT has made recommendations. TOC to follow.

## 2022-05-23 NOTE — ED Triage Notes (Signed)
Pt reports he fell getting into his car on Tuesday and has had left knee pain ever since that is not improving.

## 2022-05-24 MED ORDER — OXYCODONE HCL 5 MG PO TABS
5.0000 mg | ORAL_TABLET | ORAL | Status: DC | PRN
  Administered 2022-05-24 – 2022-05-27 (×10): 5 mg via ORAL
  Filled 2022-05-24 (×12): qty 1

## 2022-05-24 NOTE — NC FL2 (Signed)
[place] LEVEL OF CARE FORM     IDENTIFICATION  Patient Name: Frederick Brown Birthdate: [birth year] Sex: male Admission Date (Current Location): 05/23/2022  [facility] and [place] Number:  [employer] and Address:  [place] [address] [place]      Provider Number: [id number]  Attending Physician Name and Address:  Default, Provider, MD  Relative Name and Phone Number:       Current Level of Care: Hospital Recommended Level of Care: [place] Prior Approval Number:    Date Approved/Denied:   PASRR Number: [id number]  Discharge Plan: SNF    Current Diagnoses: Patient Active Problem List   Diagnosis Date Noted   Ureteral calculi 02/22/2015   Atrial fibrillation with RVR ([place]) 02/22/2015   Pyelonephritis 01/17/2015   Hydronephrosis 01/17/2015   Lower extremity weakness 01/17/2015   Essential hypertension 01/17/2015   GERD without esophagitis 01/17/2015   Jaundice 10/07/2013   Morbid obesity ([place]) 10/07/2013   Hyperkalemia 06/23/2013   Hypotension 06/23/2013   Dehydration 123456   Diastolic dysfunction 123456   Gout 06/23/2013   Urinary tract infection, site not specified 06/23/2013   AKI (acute kidney injury) ([place]) 06/22/2013   Urticarial rash 05/31/2011   UTI (lower urinary tract infection) 05/30/2011   Hypercalcemia 05/29/2011   Fracture of metatarsal of left foot, closed 05/28/2011   Fracture of great toe, left, closed 05/28/2011   Chronic respiratory failure with hypoxia ([place]) 05/28/2011   Cor pulmonale, chronic (HCC) 0000000   Acute diastolic heart failure ([place]) 05/23/2011   Vitamin B12 deficiency 05/22/2011   Bronchospasm 05/21/2011   Atrial fibrillation ([place]) 05/21/2011   Superficial fungal infection of skin 05/21/2011   Hypernatremia 05/21/2011   Macrocytosis 05/21/2011   Alcohol abuse 05/21/2011    Orientation RESPIRATION BLADDER Height & Weight     Self, Time,  Situation, Place  Normal Continent Weight: (!) 366 lb (166 kg) Height:  '5\' 10"'$  (177.8 cm)  BEHAVIORAL SYMPTOMS/MOOD NEUROLOGICAL BOWEL NUTRITION STATUS      Continent Diet (Regular)  AMBULATORY STATUS COMMUNICATION OF NEEDS Skin   Extensive Assist Verbally Normal                       Personal Care Assistance Level of Assistance  Bathing, Feeding, Dressing Bathing Assistance: Maximum assistance Feeding assistance: Independent Dressing Assistance: Maximum assistance     Functional Limitations Info  Sight, Hearing, Speech Sight Info: Adequate Hearing Info: Adequate Speech Info: Adequate    SPECIAL CARE FACTORS FREQUENCY  PT (By licensed PT), OT (By licensed OT)     PT Frequency: 5 times weekly OT Frequency: 5 times weekly            Contractures Contractures Info: Not present    Additional Factors Info  Code Status, Allergies Code Status Info: FULL Allergies Info: NKA           Current Medications (05/24/2022):  This is the current hospital active medication list Current Facility-Administered Medications  Medication Dose Route Frequency Provider Last Rate Last Admin   acetaminophen (TYLENOL) tablet 1,000 mg  1,000 mg Oral Q6H PRN Idol, Almyra Free, PA-C       furosemide (LASIX) tablet 80 mg  80 mg Oral BID Evalee Jefferson, PA-C   80 mg at 05/24/22 0911   metoprolol tartrate (LOPRESSOR) tablet 50 mg  50 mg Oral BID Evalee Jefferson, PA-C   50 mg at 05/24/22 [id number]   oxyCODONE (Oxy IR/ROXICODONE) immediate  release tablet 5 mg  5 mg Oral Q4H PRN Fransico Meadow, MD   5 mg at 05/24/22 [id number]   pantoprazole (PROTONIX) EC tablet 40 mg  40 mg Oral Daily Evalee Jefferson, PA-C   40 mg at 05/24/22 [id number]   potassium chloride SA (KLOR-CON M) CR tablet 20 mEq  20 mEq Oral Daily Evalee Jefferson, PA-C   20 mEq at 05/24/22 [id number]   rivaroxaban (XARELTO) tablet 20 mg  20 mg Oral Daily Evalee Jefferson, PA-C   20 mg at 05/24/22 0912   rosuvastatin (CRESTOR) tablet 20 mg  20 mg Oral Daily Evalee Jefferson, PA-C   20  mg at 05/24/22 0911   Current Outpatient Medications  Medication Sig Dispense Refill   acetaminophen (TYLENOL) 500 MG tablet Take 1,000 mg by mouth every 6 (six) hours as needed for mild pain.     Cholecalciferol (VITAMIN D-3 PO) Take 1 tablet by mouth daily.     furosemide (LASIX) 80 MG tablet TAKE 1 TABLET BY MOUTH TWICE DAILY. (Patient taking differently: 40 mg. Taking 80 in the am and taking 40 in the pm) 180 tablet 1   metoprolol tartrate (LOPRESSOR) 25 MG tablet Take 2 tablets (50 mg total) by mouth 2 (two) times daily. 60 tablet 2   omeprazole (PRILOSEC) 20 MG capsule Take 20 mg by mouth daily as needed (acid reflux).     rosuvastatin (CRESTOR) 20 MG tablet Take 20 mg by mouth daily.     allopurinol (ZYLOPRIM) 300 MG tablet Take 300 mg by mouth daily.     potassium chloride SA (KLOR-CON) 20 MEQ tablet TAKE 1 TABLET BY MOUTH ONCE DAILY. (Patient not taking: Reported on 05/17/2021) 90 tablet 3   rivaroxaban (XARELTO) 20 MG TABS tablet TAKE 1 TABLET BY MOUTH DAILY WITH SUPPER. SCHEDULE APPOINTMENT WITH MD 30 tablet 11     Discharge Medications: Please see after visit summary for a list of discharge medications.  Relevant Imaging Results:  Relevant Lab Results:   Additional Information SSN: [id number], [place]

## 2022-05-24 NOTE — Evaluation (Signed)
Physical Therapy Evaluation Patient Details Name: Frederick Brown MRN: [id number] DOB: 1950/10/19 Today's Date: 05/24/2022  History of Present Illness  Frederick Brown is a 72 y.o. male with history including hypertension, CHF, GERD, cor pulmonale with pickwickian syndrome, history of atrial fibrillation on Xarelto presenting for evaluation of left lower extremity pain which occurred suddenly 2 days ago when he was weightbearing on the left leg while he was trying to pivot into his vehicle.  Patient relies heavily on a wheelchair at his home, describes having a handicap assessable Lucianne Lei but has to pivot from his wheelchair to get into the vehicle.  Since this event he has had severe pain in his left knee and upper leg along with increased swelling of the lower extremity and pain with attempts at weightbearing.  He has taken Tylenol without symptom relief.   Clinical Impression  Patient demonstrates slow labored movement for sitting up at bedside with poor tolerance for moving LLE due to c/o severe left knee pain, once seated unable to bear weight on left leg or attempt sit to stands/transfers due to c/o left knee pain.  Patient required Mod/max assist to reposition when put back to bed.  Patient will benefit from continued skilled physical therapy in hospital and recommended venue below to increase strength, balance, endurance for safe ADLs and gait.         Recommendations for follow up therapy are one component of a multi-disciplinary discharge planning process, led by the attending physician.  Recommendations may be updated based on patient status, additional functional criteria and insurance authorization.  Follow Up Recommendations Skilled nursing-short term rehab (<3 hours/day) Can patient physically be transported by private vehicle: No    Assistance Recommended at Discharge Set up Supervision/Assistance  Patient can return home with the following  A lot of help with walking and/or  transfers;A lot of help with bathing/dressing/bathroom;Help with stairs or ramp for entrance;Assistance with cooking/housework    Equipment Recommendations None recommended by PT  Recommendations for Other Services       Functional Status Assessment Patient has had a recent decline in their functional status and demonstrates the ability to make significant improvements in function in a reasonable and predictable amount of time.     Precautions / Restrictions Precautions Precautions: Fall Required Braces or Orthoses: Knee Immobilizer - Left Restrictions Weight Bearing Restrictions: No      Mobility  Bed Mobility Overal bed mobility: Needs Assistance Bed Mobility: Supine to Sit, Sit to Supine     Supine to sit: Mod assist, Max assist Sit to supine: Mod assist, Max assist   General bed mobility comments: slow labored movement with poor tolerance for moving LLE due to increased knee pain    Transfers                        Ambulation/Gait                  Stairs            Wheelchair Mobility    Modified Rankin (Stroke Patients Only)       Balance Overall balance assessment: Needs assistance Sitting-balance support: Feet supported, No upper extremity supported Sitting balance-Leahy Scale: Fair Sitting balance - Comments: seated at EOB                                     Pertinent  Vitals/Pain Pain Assessment Pain Assessment: Faces Faces Pain Scale: Hurts whole lot Pain Location: left knee Pain Descriptors / Indicators: Moaning, Guarding, Grimacing Pain Intervention(s): Limited activity within patient's tolerance, Monitored during session, Repositioned, Patient requesting pain meds-RN notified    Home Living Family/patient expects to be discharged to:: Private residence Living Arrangements: Alone Available Help at Discharge: Neighbor;Available PRN/intermittently Type of Home: House Home Access: Ramped entrance        Home Layout: One level Home Equipment: Rollator (4 wheels);Wheelchair - manual      Prior Function Prior Level of Function : Independent/Modified Independent;Driving             Mobility Comments: household Geologist, engineering ADLs Comments: Independent     Hand Dominance   Dominant Hand: Right    Extremity/Trunk Assessment   Upper Extremity Assessment Upper Extremity Assessment: Generalized weakness    Lower Extremity Assessment Lower Extremity Assessment: Generalized weakness;LLE deficits/detail LLE Deficits / Details: grossly 3+/5 LLE: Unable to fully assess due to pain LLE Sensation: WNL LLE Coordination: WNL    Cervical / Trunk Assessment Cervical / Trunk Assessment: Normal  Communication   Communication: No difficulties  Cognition Arousal/Alertness: Awake/alert Behavior During Therapy: WFL for tasks assessed/performed Overall Cognitive Status: Within Functional Limits for tasks assessed                                          General Comments      Exercises     Assessment/Plan    PT Assessment Patient needs continued PT services  PT Problem List Decreased strength;Decreased activity tolerance;Decreased range of motion;Decreased mobility;Decreased balance;Pain       PT Treatment Interventions DME instruction;Gait training;Stair training;Functional mobility training;Therapeutic activities;Therapeutic exercise;Balance training;Patient/family education    PT Goals (Current goals can be found in the Care Plan section)  Acute Rehab PT Goals Patient Stated Goal: return home after rehab PT Goal Formulation: With patient Time For Goal Achievement: 05/31/22 Potential to Achieve Goals: Good    Frequency Min 2X/week     Co-evaluation               AM-PAC PT "6 Clicks" Mobility  Outcome Measure Help needed turning from your back to your side while in a flat bed without using bedrails?: A Lot Help needed moving from  lying on your back to sitting on the side of a flat bed without using bedrails?: A Lot Help needed moving to and from a bed to a chair (including a wheelchair)?: Total Help needed standing up from a chair using your arms (e.g., wheelchair or bedside chair)?: Total Help needed to walk in hospital room?: Total Help needed climbing 3-5 steps with a railing? : Total 6 Click Score: 8    End of Session   Activity Tolerance: Patient tolerated treatment well;Patient limited by fatigue;Patient limited by pain Patient left: in bed Nurse Communication: Mobility status PT Visit Diagnosis: Unsteadiness on feet (R26.81);Other abnormalities of gait and mobility (R26.89);Muscle weakness (generalized) (M62.81)    Time: [id number] PT Time Calculation (min) (ACUTE ONLY): 22 min   Charges:   PT Evaluation $PT Eval Moderate Complexity: 1 Mod PT Treatments $Therapeutic Activity: 8-22 mins        11:39 AM, 05/24/22 Lonell Grandchild, MPT Physical Therapist with [facility] [phone] office [phone] mobile phone

## 2022-05-24 NOTE — ED Provider Notes (Signed)
Emergency Medicine Observation Re-evaluation Note  Frederick Brown is a 72 y.o. male hypertension, CHF, GERD, cor pulmonale with pickwickian syndrome, atrial fibrillation on Xarelto, seen on rounds today.  Pt initially presented to the ED for complaints of Knee Injury Currently, the patient is complaining of leg pain.  Physical Exam  BP 116/71 (BP Location: Left Arm)   Pulse 88   Temp 98.1 F (36.7 C) (Oral)   Resp 16   Ht '5\' 10"'$  (1.778 m)   Wt (!) 166 kg   SpO2 98%   BMI 52.52 kg/m  Physical Exam General: Sitting in stretcher complaining of leg discomfort but overall no acute distress MSK: Left knee immobilizer in place.  Swelling of left proximal shin.  Compartments of the calf soft.  Foot appears warm and well-perfused.  ED Course / MDM  EKG:   I have reviewed the labs performed to date as well as medications administered while in observation.  Recent changes in the last 24 hours include none.  Plan  Current plan is for placement.  Pain medications     Fransico Meadow, MD 05/25/22 1200

## 2022-05-24 NOTE — ED Notes (Signed)
CSW updated that PT is recommending SNF for pt at D/C. CSW spoke with pt who is agreeable to SNF placement. Pt would prefer placement at [facility]. CSW explained that referral will be sent there first. CSW to complete Fl2 and send out for review. TOC to follow.

## 2022-05-24 NOTE — Plan of Care (Signed)
  Problem: Acute Rehab PT Goals(only PT should resolve) Goal: Pt Will Go Supine/Side To Sit Outcome: Progressing Flowsheets (Taken 05/24/2022 1145) Pt will go Supine/Side to Sit:  with minimal assist  with moderate assist Goal: Patient Will Transfer Sit To/From Stand Outcome: Progressing Flowsheets (Taken 05/24/2022 1145) Patient will transfer sit to/from stand:  with minimal assist  with moderate assist Goal: Pt Will Transfer Bed To Chair/Chair To Bed Outcome: Progressing Flowsheets (Taken 05/24/2022 1145) Pt will Transfer Bed to Chair/Chair to Bed: with mod assist Goal: Pt Will Ambulate Outcome: Progressing Flowsheets (Taken 05/24/2022 1145) Pt will Ambulate:  15 feet  with moderate assist  with rolling walker   11:46 AM, 05/24/22 Lonell Grandchild, MPT Physical Therapist with [facility] [phone] office [phone] mobile phone

## 2022-05-25 NOTE — ED Notes (Signed)
Pt received lunch tray 

## 2022-05-25 NOTE — ED Provider Notes (Signed)
Emergency Medicine Observation Re-evaluation Note  Frederick Brown is a 72 y.o. male, seen on rounds today.  Pt initially presented to the ED for complaints of Knee Injury Currently, the patient is stable awaiting nursing home placement  Physical Exam  BP 101/79   Pulse 91   Temp 98.1 F (36.7 C) (Oral)   Resp 18   Ht '5\' 10"'$  (1.778 m)   Wt (!) 166 kg   SpO2 96%   BMI 52.52 kg/m  Physical Exam Patient in no acute distress.  Patient alert and oriented  ED Course / MDM  EKG:   I have reviewed the labs performed to date as well as medications administered while in observation.  Recent changes in the last 24 hours include no change.  Plan  Current plan is for nursing home placement.    Milton Ferguson, MD 05/25/22 [phone]

## 2022-05-25 NOTE — ED Notes (Signed)
Pt received dinner tray.

## 2022-05-26 NOTE — ED Notes (Signed)
At pt bedside, pt requesting warm washcloth to wipe off face, pt says he hasn't washed up in several days, says he usually just washes up in bed- pt currently on ED stretcher which will not allow staff to give bed bath in safe manner as they are narrow and pt does not have room to turn from side to side- Nira Conn, RN, [facility] made aware- requesting  hospital bed for pt comfort and safety. Pt is currently TOC boarder.

## 2022-05-26 NOTE — ED Provider Notes (Signed)
Emergency Medicine Observation Re-evaluation Note  Frederick Brown is a 72 y.o. male, seen on rounds today.  Pt initially presented to the ED for complaints of Knee Injury Currently, the patient is stable.  Physical Exam  BP 115/62   Pulse 68   Temp 98.1 F (36.7 C) (Oral)   Resp 17   Ht '5\' 10"'$  (1.778 m)   Wt (!) 166 kg   SpO2 99%   BMI 52.52 kg/m  Physical Exam Alert in no acute distress  ED Course / MDM  EKG:   I have reviewed the labs performed to date as well as medications administered while in observation.  Recent changes in the last 24 hours include none.  Plan  Current plan is for nh placement    Milton Ferguson, MD 05/26/22 [phone]

## 2022-05-26 NOTE — ED Notes (Signed)
Pt has call bell within reach, cell phone, ipad, glasses, and urinal also within reach. Lights dimmed for comfort

## 2022-05-26 NOTE — ED Notes (Signed)
Pt transported from ED stretcher to hospital bed for comfort and safety

## 2022-05-27 DIAGNOSIS — W19XXXA Unspecified fall, initial encounter: Secondary | ICD-10-CM | POA: Diagnosis not present

## 2022-05-27 DIAGNOSIS — M154 Erosive (osteo)arthritis: Secondary | ICD-10-CM | POA: Diagnosis not present

## 2022-05-27 DIAGNOSIS — G4733 Obstructive sleep apnea (adult) (pediatric): Secondary | ICD-10-CM | POA: Diagnosis not present

## 2022-05-27 DIAGNOSIS — Z6841 Body Mass Index (BMI) 40.0 and over, adult: Secondary | ICD-10-CM | POA: Diagnosis not present

## 2022-05-27 DIAGNOSIS — M25462 Effusion, left knee: Secondary | ICD-10-CM | POA: Diagnosis not present

## 2022-05-27 DIAGNOSIS — M25571 Pain in right ankle and joints of right foot: Secondary | ICD-10-CM | POA: Diagnosis not present

## 2022-05-27 DIAGNOSIS — E662 Morbid (severe) obesity with alveolar hypoventilation: Secondary | ICD-10-CM | POA: Diagnosis not present

## 2022-05-27 DIAGNOSIS — I10 Essential (primary) hypertension: Secondary | ICD-10-CM | POA: Diagnosis not present

## 2022-05-27 DIAGNOSIS — S82832A Other fracture of upper and lower end of left fibula, initial encounter for closed fracture: Secondary | ICD-10-CM | POA: Diagnosis not present

## 2022-05-27 DIAGNOSIS — K5903 Drug induced constipation: Secondary | ICD-10-CM | POA: Diagnosis not present

## 2022-05-27 DIAGNOSIS — I110 Hypertensive heart disease with heart failure: Secondary | ICD-10-CM | POA: Diagnosis not present

## 2022-05-27 DIAGNOSIS — R29898 Other symptoms and signs involving the musculoskeletal system: Secondary | ICD-10-CM | POA: Diagnosis not present

## 2022-05-27 DIAGNOSIS — I509 Heart failure, unspecified: Secondary | ICD-10-CM | POA: Diagnosis not present

## 2022-05-27 DIAGNOSIS — I482 Chronic atrial fibrillation, unspecified: Secondary | ICD-10-CM | POA: Diagnosis not present

## 2022-05-27 DIAGNOSIS — R600 Localized edema: Secondary | ICD-10-CM | POA: Diagnosis not present

## 2022-05-27 DIAGNOSIS — G4700 Insomnia, unspecified: Secondary | ICD-10-CM | POA: Diagnosis not present

## 2022-05-27 DIAGNOSIS — G894 Chronic pain syndrome: Secondary | ICD-10-CM | POA: Diagnosis not present

## 2022-05-27 DIAGNOSIS — Y92810 Car as the place of occurrence of the external cause: Secondary | ICD-10-CM | POA: Diagnosis not present

## 2022-05-27 DIAGNOSIS — I4891 Unspecified atrial fibrillation: Secondary | ICD-10-CM | POA: Diagnosis not present

## 2022-05-27 DIAGNOSIS — E119 Type 2 diabetes mellitus without complications: Secondary | ICD-10-CM | POA: Diagnosis not present

## 2022-05-27 DIAGNOSIS — S82492D Other fracture of shaft of left fibula, subsequent encounter for closed fracture with routine healing: Secondary | ICD-10-CM | POA: Diagnosis not present

## 2022-05-27 DIAGNOSIS — G629 Polyneuropathy, unspecified: Secondary | ICD-10-CM | POA: Diagnosis not present

## 2022-05-27 DIAGNOSIS — I480 Paroxysmal atrial fibrillation: Secondary | ICD-10-CM | POA: Diagnosis not present

## 2022-05-27 DIAGNOSIS — R5381 Other malaise: Secondary | ICD-10-CM | POA: Diagnosis not present

## 2022-05-27 DIAGNOSIS — M25562 Pain in left knee: Secondary | ICD-10-CM | POA: Diagnosis not present

## 2022-05-27 DIAGNOSIS — M7989 Other specified soft tissue disorders: Secondary | ICD-10-CM | POA: Diagnosis not present

## 2022-05-27 DIAGNOSIS — I5031 Acute diastolic (congestive) heart failure: Secondary | ICD-10-CM | POA: Diagnosis not present

## 2022-05-27 DIAGNOSIS — M79605 Pain in left leg: Secondary | ICD-10-CM | POA: Diagnosis not present

## 2022-05-27 DIAGNOSIS — F1011 Alcohol abuse, in remission: Secondary | ICD-10-CM | POA: Diagnosis not present

## 2022-05-27 DIAGNOSIS — I5032 Chronic diastolic (congestive) heart failure: Secondary | ICD-10-CM | POA: Diagnosis not present

## 2022-05-27 DIAGNOSIS — M6281 Muscle weakness (generalized): Secondary | ICD-10-CM | POA: Diagnosis not present

## 2022-05-27 DIAGNOSIS — M109 Gout, unspecified: Secondary | ICD-10-CM | POA: Diagnosis not present

## 2022-05-27 DIAGNOSIS — Z993 Dependence on wheelchair: Secondary | ICD-10-CM | POA: Diagnosis not present

## 2022-05-27 DIAGNOSIS — F331 Major depressive disorder, recurrent, moderate: Secondary | ICD-10-CM | POA: Diagnosis not present

## 2022-05-27 DIAGNOSIS — E782 Mixed hyperlipidemia: Secondary | ICD-10-CM | POA: Diagnosis not present

## 2022-05-27 DIAGNOSIS — M80062D Age-related osteoporosis with current pathological fracture, left lower leg, subsequent encounter for fracture with routine healing: Secondary | ICD-10-CM | POA: Diagnosis not present

## 2022-05-27 DIAGNOSIS — S8992XA Unspecified injury of left lower leg, initial encounter: Secondary | ICD-10-CM | POA: Diagnosis not present

## 2022-05-27 DIAGNOSIS — Z7901 Long term (current) use of anticoagulants: Secondary | ICD-10-CM | POA: Diagnosis not present

## 2022-05-27 DIAGNOSIS — R2689 Other abnormalities of gait and mobility: Secondary | ICD-10-CM | POA: Diagnosis not present

## 2022-05-27 DIAGNOSIS — M25551 Pain in right hip: Secondary | ICD-10-CM | POA: Diagnosis not present

## 2022-05-27 DIAGNOSIS — E8809 Other disorders of plasma-protein metabolism, not elsewhere classified: Secondary | ICD-10-CM | POA: Diagnosis not present

## 2022-05-27 DIAGNOSIS — M1712 Unilateral primary osteoarthritis, left knee: Secondary | ICD-10-CM | POA: Diagnosis not present

## 2022-05-27 DIAGNOSIS — J40 Bronchitis, not specified as acute or chronic: Secondary | ICD-10-CM | POA: Diagnosis not present

## 2022-05-27 DIAGNOSIS — Z79899 Other long term (current) drug therapy: Secondary | ICD-10-CM | POA: Diagnosis not present

## 2022-05-27 NOTE — ED Provider Notes (Signed)
Emergency Medicine Observation Re-evaluation Note  Frederick Brown is a 72 y.o. male, seen on rounds today.  Pt initially presented to the ED for complaints of Knee Injury Currently, the patient is awaiting nursing home placement.  Physical Exam  BP (!) 123/42 (BP Location: Left Arm)   Pulse 60   Temp (!) 97.5 F (36.4 C) (Oral)   Resp 20   Ht '5\' 10"'$  (1.778 m)   Wt (!) 166 kg   SpO2 97%   BMI 52.52 kg/m  Physical Exam Alert no acute distress  ED Course / MDM  EKG:   I have reviewed the labs performed to date as well as medications administered while in observation.  Recent changes in the last 24 hours include none.  Plan  Current plan is for nursing home placement.    Milton Ferguson, MD 05/27/22 1021

## 2022-05-27 NOTE — ED Notes (Signed)
Attempted to call report

## 2022-05-27 NOTE — ED Notes (Signed)
Patient meal tray given to patient

## 2022-05-27 NOTE — ED Notes (Addendum)
CSW spoke with pt to go over bed offers. Pt states that he would like to have an hour to think about it. TOC to follow.   Addendum 11:20: CSW spoke with pt to review bed offers again. At this time pt states he would like to accept the bed at [facility]. CSW updated this in the [place]. CSW reached out to admissions at [facility] to see if they have a bed today. CSW also updated insurance auth to reflect bed choice, auth pending at this time. TOC to follow.   Addendum 1:55pm: CSW spoke to [facility] who state they have a bed and can accept pt today. They will be able to accept pt after 4:30. Pt would like to transport via [facility] as he has his personal wheelchair. CSW to provide RN and MD with update. RN to be provided with room and report numbers.

## 2022-05-27 NOTE — Discharge Instructions (Addendum)
Go to [facility] now

## 2022-05-28 DIAGNOSIS — I10 Essential (primary) hypertension: Secondary | ICD-10-CM | POA: Diagnosis not present

## 2022-05-28 DIAGNOSIS — I5031 Acute diastolic (congestive) heart failure: Secondary | ICD-10-CM | POA: Diagnosis not present

## 2022-05-28 DIAGNOSIS — I480 Paroxysmal atrial fibrillation: Secondary | ICD-10-CM | POA: Diagnosis not present

## 2022-05-29 DIAGNOSIS — M79605 Pain in left leg: Secondary | ICD-10-CM | POA: Diagnosis not present

## 2022-06-03 DIAGNOSIS — M79605 Pain in left leg: Secondary | ICD-10-CM | POA: Diagnosis not present

## 2022-06-03 DIAGNOSIS — M25551 Pain in right hip: Secondary | ICD-10-CM | POA: Diagnosis not present

## 2022-06-03 DIAGNOSIS — M109 Gout, unspecified: Secondary | ICD-10-CM | POA: Diagnosis not present

## 2022-06-03 DIAGNOSIS — M25571 Pain in right ankle and joints of right foot: Secondary | ICD-10-CM | POA: Diagnosis not present

## 2022-06-04 DIAGNOSIS — M25571 Pain in right ankle and joints of right foot: Secondary | ICD-10-CM | POA: Diagnosis not present

## 2022-06-04 DIAGNOSIS — M25551 Pain in right hip: Secondary | ICD-10-CM | POA: Diagnosis not present

## 2022-06-04 DIAGNOSIS — G629 Polyneuropathy, unspecified: Secondary | ICD-10-CM | POA: Diagnosis not present

## 2022-06-04 DIAGNOSIS — M79605 Pain in left leg: Secondary | ICD-10-CM | POA: Diagnosis not present

## 2022-06-05 DIAGNOSIS — G894 Chronic pain syndrome: Secondary | ICD-10-CM | POA: Diagnosis not present

## 2022-06-05 DIAGNOSIS — E8809 Other disorders of plasma-protein metabolism, not elsewhere classified: Secondary | ICD-10-CM | POA: Diagnosis not present

## 2022-06-06 DIAGNOSIS — M25571 Pain in right ankle and joints of right foot: Secondary | ICD-10-CM | POA: Diagnosis not present

## 2022-06-06 DIAGNOSIS — R29898 Other symptoms and signs involving the musculoskeletal system: Secondary | ICD-10-CM | POA: Diagnosis not present

## 2022-06-06 DIAGNOSIS — G894 Chronic pain syndrome: Secondary | ICD-10-CM | POA: Diagnosis not present

## 2022-06-06 DIAGNOSIS — M79605 Pain in left leg: Secondary | ICD-10-CM | POA: Diagnosis not present

## 2022-06-11 DIAGNOSIS — J40 Bronchitis, not specified as acute or chronic: Secondary | ICD-10-CM | POA: Diagnosis not present

## 2022-06-12 ENCOUNTER — Other Ambulatory Visit: Payer: Self-pay | Admitting: Cardiovascular Disease

## 2022-06-12 NOTE — Telephone Encounter (Signed)
Prescription refill request for Xarelto received.  Indication: AF Last office visit: 05/17/21  Edmonia Yutaka MD Weight: 166kg Age: 72 Scr: 1.35 on 05/23/22 CrCl: 119.55  Based on above findings Xarelto '20mg'$  daily is the appropriate dose.  Pt is past due for appt with Dr Johnsie Cancel.  Message sent to schedulers to make appt.  Appt needed for future refills.  Refilled x 1.

## 2022-06-13 DIAGNOSIS — M79605 Pain in left leg: Secondary | ICD-10-CM | POA: Diagnosis not present

## 2022-06-13 DIAGNOSIS — M80062D Age-related osteoporosis with current pathological fracture, left lower leg, subsequent encounter for fracture with routine healing: Secondary | ICD-10-CM | POA: Diagnosis not present

## 2022-06-14 ENCOUNTER — Ambulatory Visit: Payer: Medicare Other | Admitting: Orthopedic Surgery

## 2022-06-14 DIAGNOSIS — M80062D Age-related osteoporosis with current pathological fracture, left lower leg, subsequent encounter for fracture with routine healing: Secondary | ICD-10-CM | POA: Diagnosis not present

## 2022-06-17 DIAGNOSIS — K5903 Drug induced constipation: Secondary | ICD-10-CM | POA: Diagnosis not present

## 2022-06-17 DIAGNOSIS — M80062D Age-related osteoporosis with current pathological fracture, left lower leg, subsequent encounter for fracture with routine healing: Secondary | ICD-10-CM | POA: Diagnosis not present

## 2022-06-20 DIAGNOSIS — S82492D Other fracture of shaft of left fibula, subsequent encounter for closed fracture with routine healing: Secondary | ICD-10-CM | POA: Diagnosis not present

## 2022-06-20 DIAGNOSIS — R2689 Other abnormalities of gait and mobility: Secondary | ICD-10-CM | POA: Diagnosis not present

## 2022-06-20 DIAGNOSIS — R5381 Other malaise: Secondary | ICD-10-CM | POA: Diagnosis not present

## 2022-06-20 DIAGNOSIS — R29898 Other symptoms and signs involving the musculoskeletal system: Secondary | ICD-10-CM | POA: Diagnosis not present

## 2022-06-20 DIAGNOSIS — M25562 Pain in left knee: Secondary | ICD-10-CM | POA: Diagnosis not present

## 2022-06-21 DIAGNOSIS — I10 Essential (primary) hypertension: Secondary | ICD-10-CM | POA: Diagnosis not present

## 2022-06-21 DIAGNOSIS — R29898 Other symptoms and signs involving the musculoskeletal system: Secondary | ICD-10-CM | POA: Diagnosis not present

## 2022-06-21 DIAGNOSIS — I480 Paroxysmal atrial fibrillation: Secondary | ICD-10-CM | POA: Diagnosis not present

## 2022-06-24 DIAGNOSIS — E669 Obesity, unspecified: Secondary | ICD-10-CM | POA: Diagnosis not present

## 2022-06-24 DIAGNOSIS — I110 Hypertensive heart disease with heart failure: Secondary | ICD-10-CM | POA: Diagnosis not present

## 2022-06-24 DIAGNOSIS — Z7901 Long term (current) use of anticoagulants: Secondary | ICD-10-CM | POA: Diagnosis not present

## 2022-06-24 DIAGNOSIS — I4891 Unspecified atrial fibrillation: Secondary | ICD-10-CM | POA: Diagnosis not present

## 2022-06-24 DIAGNOSIS — Z9181 History of falling: Secondary | ICD-10-CM | POA: Diagnosis not present

## 2022-06-24 DIAGNOSIS — I509 Heart failure, unspecified: Secondary | ICD-10-CM | POA: Diagnosis not present

## 2022-06-24 DIAGNOSIS — K219 Gastro-esophageal reflux disease without esophagitis: Secondary | ICD-10-CM | POA: Diagnosis not present

## 2022-06-24 DIAGNOSIS — E785 Hyperlipidemia, unspecified: Secondary | ICD-10-CM | POA: Diagnosis not present

## 2022-06-24 DIAGNOSIS — M109 Gout, unspecified: Secondary | ICD-10-CM | POA: Diagnosis not present

## 2022-06-24 DIAGNOSIS — Z6841 Body Mass Index (BMI) 40.0 and over, adult: Secondary | ICD-10-CM | POA: Diagnosis not present

## 2022-06-24 DIAGNOSIS — M80062D Age-related osteoporosis with current pathological fracture, left lower leg, subsequent encounter for fracture with routine healing: Secondary | ICD-10-CM | POA: Diagnosis not present

## 2022-06-24 DIAGNOSIS — M25462 Effusion, left knee: Secondary | ICD-10-CM | POA: Diagnosis not present

## 2022-06-24 DIAGNOSIS — G629 Polyneuropathy, unspecified: Secondary | ICD-10-CM | POA: Diagnosis not present

## 2022-06-25 DIAGNOSIS — E662 Morbid (severe) obesity with alveolar hypoventilation: Secondary | ICD-10-CM | POA: Diagnosis not present

## 2022-06-25 DIAGNOSIS — M80062D Age-related osteoporosis with current pathological fracture, left lower leg, subsequent encounter for fracture with routine healing: Secondary | ICD-10-CM | POA: Diagnosis not present

## 2022-06-25 DIAGNOSIS — S82492D Other fracture of shaft of left fibula, subsequent encounter for closed fracture with routine healing: Secondary | ICD-10-CM | POA: Diagnosis not present

## 2022-06-25 DIAGNOSIS — Z7901 Long term (current) use of anticoagulants: Secondary | ICD-10-CM | POA: Diagnosis not present

## 2022-06-25 DIAGNOSIS — Z6841 Body Mass Index (BMI) 40.0 and over, adult: Secondary | ICD-10-CM | POA: Diagnosis not present

## 2022-06-25 DIAGNOSIS — G629 Polyneuropathy, unspecified: Secondary | ICD-10-CM | POA: Diagnosis not present

## 2022-06-25 DIAGNOSIS — I4891 Unspecified atrial fibrillation: Secondary | ICD-10-CM | POA: Diagnosis not present

## 2022-06-25 DIAGNOSIS — M109 Gout, unspecified: Secondary | ICD-10-CM | POA: Diagnosis not present

## 2022-06-25 DIAGNOSIS — E669 Obesity, unspecified: Secondary | ICD-10-CM | POA: Diagnosis not present

## 2022-06-25 DIAGNOSIS — E785 Hyperlipidemia, unspecified: Secondary | ICD-10-CM | POA: Diagnosis not present

## 2022-06-25 DIAGNOSIS — M25462 Effusion, left knee: Secondary | ICD-10-CM | POA: Diagnosis not present

## 2022-06-25 DIAGNOSIS — K219 Gastro-esophageal reflux disease without esophagitis: Secondary | ICD-10-CM | POA: Diagnosis not present

## 2022-06-25 DIAGNOSIS — Z9181 History of falling: Secondary | ICD-10-CM | POA: Diagnosis not present

## 2022-06-25 DIAGNOSIS — M6281 Muscle weakness (generalized): Secondary | ICD-10-CM | POA: Diagnosis not present

## 2022-06-25 DIAGNOSIS — Z993 Dependence on wheelchair: Secondary | ICD-10-CM | POA: Diagnosis not present

## 2022-06-25 DIAGNOSIS — I5032 Chronic diastolic (congestive) heart failure: Secondary | ICD-10-CM | POA: Diagnosis not present

## 2022-06-25 DIAGNOSIS — I509 Heart failure, unspecified: Secondary | ICD-10-CM | POA: Diagnosis not present

## 2022-06-25 DIAGNOSIS — I110 Hypertensive heart disease with heart failure: Secondary | ICD-10-CM | POA: Diagnosis not present

## 2022-06-26 DIAGNOSIS — M109 Gout, unspecified: Secondary | ICD-10-CM | POA: Diagnosis not present

## 2022-06-26 DIAGNOSIS — E785 Hyperlipidemia, unspecified: Secondary | ICD-10-CM | POA: Diagnosis not present

## 2022-06-26 DIAGNOSIS — I4891 Unspecified atrial fibrillation: Secondary | ICD-10-CM | POA: Diagnosis not present

## 2022-06-26 DIAGNOSIS — Z6841 Body Mass Index (BMI) 40.0 and over, adult: Secondary | ICD-10-CM | POA: Diagnosis not present

## 2022-06-26 DIAGNOSIS — I509 Heart failure, unspecified: Secondary | ICD-10-CM | POA: Diagnosis not present

## 2022-06-26 DIAGNOSIS — K219 Gastro-esophageal reflux disease without esophagitis: Secondary | ICD-10-CM | POA: Diagnosis not present

## 2022-06-26 DIAGNOSIS — Z7901 Long term (current) use of anticoagulants: Secondary | ICD-10-CM | POA: Diagnosis not present

## 2022-06-26 DIAGNOSIS — I110 Hypertensive heart disease with heart failure: Secondary | ICD-10-CM | POA: Diagnosis not present

## 2022-06-26 DIAGNOSIS — M80062D Age-related osteoporosis with current pathological fracture, left lower leg, subsequent encounter for fracture with routine healing: Secondary | ICD-10-CM | POA: Diagnosis not present

## 2022-06-26 DIAGNOSIS — E669 Obesity, unspecified: Secondary | ICD-10-CM | POA: Diagnosis not present

## 2022-06-26 DIAGNOSIS — G629 Polyneuropathy, unspecified: Secondary | ICD-10-CM | POA: Diagnosis not present

## 2022-06-26 DIAGNOSIS — M25462 Effusion, left knee: Secondary | ICD-10-CM | POA: Diagnosis not present

## 2022-06-26 DIAGNOSIS — Z9181 History of falling: Secondary | ICD-10-CM | POA: Diagnosis not present

## 2022-06-28 DIAGNOSIS — Z9181 History of falling: Secondary | ICD-10-CM | POA: Diagnosis not present

## 2022-06-28 DIAGNOSIS — I110 Hypertensive heart disease with heart failure: Secondary | ICD-10-CM | POA: Diagnosis not present

## 2022-06-28 DIAGNOSIS — Z6841 Body Mass Index (BMI) 40.0 and over, adult: Secondary | ICD-10-CM | POA: Diagnosis not present

## 2022-06-28 DIAGNOSIS — I509 Heart failure, unspecified: Secondary | ICD-10-CM | POA: Diagnosis not present

## 2022-06-28 DIAGNOSIS — G629 Polyneuropathy, unspecified: Secondary | ICD-10-CM | POA: Diagnosis not present

## 2022-06-28 DIAGNOSIS — M25462 Effusion, left knee: Secondary | ICD-10-CM | POA: Diagnosis not present

## 2022-06-28 DIAGNOSIS — M109 Gout, unspecified: Secondary | ICD-10-CM | POA: Diagnosis not present

## 2022-06-28 DIAGNOSIS — E669 Obesity, unspecified: Secondary | ICD-10-CM | POA: Diagnosis not present

## 2022-06-28 DIAGNOSIS — I4891 Unspecified atrial fibrillation: Secondary | ICD-10-CM | POA: Diagnosis not present

## 2022-06-28 DIAGNOSIS — M80062D Age-related osteoporosis with current pathological fracture, left lower leg, subsequent encounter for fracture with routine healing: Secondary | ICD-10-CM | POA: Diagnosis not present

## 2022-06-28 DIAGNOSIS — K219 Gastro-esophageal reflux disease without esophagitis: Secondary | ICD-10-CM | POA: Diagnosis not present

## 2022-06-28 DIAGNOSIS — E785 Hyperlipidemia, unspecified: Secondary | ICD-10-CM | POA: Diagnosis not present

## 2022-06-28 DIAGNOSIS — Z7901 Long term (current) use of anticoagulants: Secondary | ICD-10-CM | POA: Diagnosis not present

## 2022-07-01 DIAGNOSIS — Z7901 Long term (current) use of anticoagulants: Secondary | ICD-10-CM | POA: Diagnosis not present

## 2022-07-01 DIAGNOSIS — I509 Heart failure, unspecified: Secondary | ICD-10-CM | POA: Diagnosis not present

## 2022-07-01 DIAGNOSIS — M25462 Effusion, left knee: Secondary | ICD-10-CM | POA: Diagnosis not present

## 2022-07-01 DIAGNOSIS — E669 Obesity, unspecified: Secondary | ICD-10-CM | POA: Diagnosis not present

## 2022-07-01 DIAGNOSIS — K219 Gastro-esophageal reflux disease without esophagitis: Secondary | ICD-10-CM | POA: Diagnosis not present

## 2022-07-01 DIAGNOSIS — Z6841 Body Mass Index (BMI) 40.0 and over, adult: Secondary | ICD-10-CM | POA: Diagnosis not present

## 2022-07-01 DIAGNOSIS — I110 Hypertensive heart disease with heart failure: Secondary | ICD-10-CM | POA: Diagnosis not present

## 2022-07-01 DIAGNOSIS — E785 Hyperlipidemia, unspecified: Secondary | ICD-10-CM | POA: Diagnosis not present

## 2022-07-01 DIAGNOSIS — M109 Gout, unspecified: Secondary | ICD-10-CM | POA: Diagnosis not present

## 2022-07-01 DIAGNOSIS — G629 Polyneuropathy, unspecified: Secondary | ICD-10-CM | POA: Diagnosis not present

## 2022-07-01 DIAGNOSIS — M80062D Age-related osteoporosis with current pathological fracture, left lower leg, subsequent encounter for fracture with routine healing: Secondary | ICD-10-CM | POA: Diagnosis not present

## 2022-07-01 DIAGNOSIS — I4891 Unspecified atrial fibrillation: Secondary | ICD-10-CM | POA: Diagnosis not present

## 2022-07-01 DIAGNOSIS — Z9181 History of falling: Secondary | ICD-10-CM | POA: Diagnosis not present

## 2022-07-02 DIAGNOSIS — E669 Obesity, unspecified: Secondary | ICD-10-CM | POA: Diagnosis not present

## 2022-07-02 DIAGNOSIS — I110 Hypertensive heart disease with heart failure: Secondary | ICD-10-CM | POA: Diagnosis not present

## 2022-07-02 DIAGNOSIS — K219 Gastro-esophageal reflux disease without esophagitis: Secondary | ICD-10-CM | POA: Diagnosis not present

## 2022-07-02 DIAGNOSIS — M80062D Age-related osteoporosis with current pathological fracture, left lower leg, subsequent encounter for fracture with routine healing: Secondary | ICD-10-CM | POA: Diagnosis not present

## 2022-07-02 DIAGNOSIS — G629 Polyneuropathy, unspecified: Secondary | ICD-10-CM | POA: Diagnosis not present

## 2022-07-02 DIAGNOSIS — E785 Hyperlipidemia, unspecified: Secondary | ICD-10-CM | POA: Diagnosis not present

## 2022-07-02 DIAGNOSIS — Z6841 Body Mass Index (BMI) 40.0 and over, adult: Secondary | ICD-10-CM | POA: Diagnosis not present

## 2022-07-02 DIAGNOSIS — Z9181 History of falling: Secondary | ICD-10-CM | POA: Diagnosis not present

## 2022-07-02 DIAGNOSIS — Z7901 Long term (current) use of anticoagulants: Secondary | ICD-10-CM | POA: Diagnosis not present

## 2022-07-02 DIAGNOSIS — M25462 Effusion, left knee: Secondary | ICD-10-CM | POA: Diagnosis not present

## 2022-07-02 DIAGNOSIS — I4891 Unspecified atrial fibrillation: Secondary | ICD-10-CM | POA: Diagnosis not present

## 2022-07-02 DIAGNOSIS — I509 Heart failure, unspecified: Secondary | ICD-10-CM | POA: Diagnosis not present

## 2022-07-02 DIAGNOSIS — M109 Gout, unspecified: Secondary | ICD-10-CM | POA: Diagnosis not present

## 2022-07-03 ENCOUNTER — Ambulatory Visit: Payer: Medicare Other | Admitting: Orthopedic Surgery

## 2022-07-04 DIAGNOSIS — G629 Polyneuropathy, unspecified: Secondary | ICD-10-CM | POA: Diagnosis not present

## 2022-07-04 DIAGNOSIS — Z7901 Long term (current) use of anticoagulants: Secondary | ICD-10-CM | POA: Diagnosis not present

## 2022-07-04 DIAGNOSIS — I509 Heart failure, unspecified: Secondary | ICD-10-CM | POA: Diagnosis not present

## 2022-07-04 DIAGNOSIS — M25462 Effusion, left knee: Secondary | ICD-10-CM | POA: Diagnosis not present

## 2022-07-04 DIAGNOSIS — Z9181 History of falling: Secondary | ICD-10-CM | POA: Diagnosis not present

## 2022-07-04 DIAGNOSIS — M80062D Age-related osteoporosis with current pathological fracture, left lower leg, subsequent encounter for fracture with routine healing: Secondary | ICD-10-CM | POA: Diagnosis not present

## 2022-07-04 DIAGNOSIS — Z6841 Body Mass Index (BMI) 40.0 and over, adult: Secondary | ICD-10-CM | POA: Diagnosis not present

## 2022-07-04 DIAGNOSIS — I4891 Unspecified atrial fibrillation: Secondary | ICD-10-CM | POA: Diagnosis not present

## 2022-07-04 DIAGNOSIS — E785 Hyperlipidemia, unspecified: Secondary | ICD-10-CM | POA: Diagnosis not present

## 2022-07-04 DIAGNOSIS — E669 Obesity, unspecified: Secondary | ICD-10-CM | POA: Diagnosis not present

## 2022-07-04 DIAGNOSIS — M109 Gout, unspecified: Secondary | ICD-10-CM | POA: Diagnosis not present

## 2022-07-04 DIAGNOSIS — I110 Hypertensive heart disease with heart failure: Secondary | ICD-10-CM | POA: Diagnosis not present

## 2022-07-04 DIAGNOSIS — K219 Gastro-esophageal reflux disease without esophagitis: Secondary | ICD-10-CM | POA: Diagnosis not present

## 2022-07-06 DIAGNOSIS — Z6841 Body Mass Index (BMI) 40.0 and over, adult: Secondary | ICD-10-CM | POA: Diagnosis not present

## 2022-07-06 DIAGNOSIS — I110 Hypertensive heart disease with heart failure: Secondary | ICD-10-CM | POA: Diagnosis not present

## 2022-07-06 DIAGNOSIS — I509 Heart failure, unspecified: Secondary | ICD-10-CM | POA: Diagnosis not present

## 2022-07-06 DIAGNOSIS — I4891 Unspecified atrial fibrillation: Secondary | ICD-10-CM | POA: Diagnosis not present

## 2022-07-06 DIAGNOSIS — Z9181 History of falling: Secondary | ICD-10-CM | POA: Diagnosis not present

## 2022-07-06 DIAGNOSIS — G629 Polyneuropathy, unspecified: Secondary | ICD-10-CM | POA: Diagnosis not present

## 2022-07-06 DIAGNOSIS — M25462 Effusion, left knee: Secondary | ICD-10-CM | POA: Diagnosis not present

## 2022-07-06 DIAGNOSIS — E785 Hyperlipidemia, unspecified: Secondary | ICD-10-CM | POA: Diagnosis not present

## 2022-07-06 DIAGNOSIS — Z7901 Long term (current) use of anticoagulants: Secondary | ICD-10-CM | POA: Diagnosis not present

## 2022-07-06 DIAGNOSIS — K219 Gastro-esophageal reflux disease without esophagitis: Secondary | ICD-10-CM | POA: Diagnosis not present

## 2022-07-06 DIAGNOSIS — M109 Gout, unspecified: Secondary | ICD-10-CM | POA: Diagnosis not present

## 2022-07-06 DIAGNOSIS — M80062D Age-related osteoporosis with current pathological fracture, left lower leg, subsequent encounter for fracture with routine healing: Secondary | ICD-10-CM | POA: Diagnosis not present

## 2022-07-06 DIAGNOSIS — E669 Obesity, unspecified: Secondary | ICD-10-CM | POA: Diagnosis not present

## 2022-07-08 DIAGNOSIS — E785 Hyperlipidemia, unspecified: Secondary | ICD-10-CM | POA: Diagnosis not present

## 2022-07-08 DIAGNOSIS — K219 Gastro-esophageal reflux disease without esophagitis: Secondary | ICD-10-CM | POA: Diagnosis not present

## 2022-07-08 DIAGNOSIS — E669 Obesity, unspecified: Secondary | ICD-10-CM | POA: Diagnosis not present

## 2022-07-08 DIAGNOSIS — Z7901 Long term (current) use of anticoagulants: Secondary | ICD-10-CM | POA: Diagnosis not present

## 2022-07-08 DIAGNOSIS — G629 Polyneuropathy, unspecified: Secondary | ICD-10-CM | POA: Diagnosis not present

## 2022-07-08 DIAGNOSIS — M25462 Effusion, left knee: Secondary | ICD-10-CM | POA: Diagnosis not present

## 2022-07-08 DIAGNOSIS — Z6841 Body Mass Index (BMI) 40.0 and over, adult: Secondary | ICD-10-CM | POA: Diagnosis not present

## 2022-07-08 DIAGNOSIS — Z9181 History of falling: Secondary | ICD-10-CM | POA: Diagnosis not present

## 2022-07-08 DIAGNOSIS — I509 Heart failure, unspecified: Secondary | ICD-10-CM | POA: Diagnosis not present

## 2022-07-08 DIAGNOSIS — M80062D Age-related osteoporosis with current pathological fracture, left lower leg, subsequent encounter for fracture with routine healing: Secondary | ICD-10-CM | POA: Diagnosis not present

## 2022-07-08 DIAGNOSIS — I4891 Unspecified atrial fibrillation: Secondary | ICD-10-CM | POA: Diagnosis not present

## 2022-07-08 DIAGNOSIS — I110 Hypertensive heart disease with heart failure: Secondary | ICD-10-CM | POA: Diagnosis not present

## 2022-07-08 DIAGNOSIS — M109 Gout, unspecified: Secondary | ICD-10-CM | POA: Diagnosis not present

## 2022-07-09 DIAGNOSIS — M25462 Effusion, left knee: Secondary | ICD-10-CM | POA: Diagnosis not present

## 2022-07-09 DIAGNOSIS — M109 Gout, unspecified: Secondary | ICD-10-CM | POA: Diagnosis not present

## 2022-07-09 DIAGNOSIS — G629 Polyneuropathy, unspecified: Secondary | ICD-10-CM | POA: Diagnosis not present

## 2022-07-09 DIAGNOSIS — Z7901 Long term (current) use of anticoagulants: Secondary | ICD-10-CM | POA: Diagnosis not present

## 2022-07-09 DIAGNOSIS — Z6841 Body Mass Index (BMI) 40.0 and over, adult: Secondary | ICD-10-CM | POA: Diagnosis not present

## 2022-07-09 DIAGNOSIS — I4891 Unspecified atrial fibrillation: Secondary | ICD-10-CM | POA: Diagnosis not present

## 2022-07-09 DIAGNOSIS — E785 Hyperlipidemia, unspecified: Secondary | ICD-10-CM | POA: Diagnosis not present

## 2022-07-09 DIAGNOSIS — I509 Heart failure, unspecified: Secondary | ICD-10-CM | POA: Diagnosis not present

## 2022-07-09 DIAGNOSIS — M80062D Age-related osteoporosis with current pathological fracture, left lower leg, subsequent encounter for fracture with routine healing: Secondary | ICD-10-CM | POA: Diagnosis not present

## 2022-07-09 DIAGNOSIS — K219 Gastro-esophageal reflux disease without esophagitis: Secondary | ICD-10-CM | POA: Diagnosis not present

## 2022-07-09 DIAGNOSIS — Z9181 History of falling: Secondary | ICD-10-CM | POA: Diagnosis not present

## 2022-07-09 DIAGNOSIS — I110 Hypertensive heart disease with heart failure: Secondary | ICD-10-CM | POA: Diagnosis not present

## 2022-07-09 DIAGNOSIS — E669 Obesity, unspecified: Secondary | ICD-10-CM | POA: Diagnosis not present

## 2022-07-11 DIAGNOSIS — Z7901 Long term (current) use of anticoagulants: Secondary | ICD-10-CM | POA: Diagnosis not present

## 2022-07-11 DIAGNOSIS — M25462 Effusion, left knee: Secondary | ICD-10-CM | POA: Diagnosis not present

## 2022-07-11 DIAGNOSIS — M109 Gout, unspecified: Secondary | ICD-10-CM | POA: Diagnosis not present

## 2022-07-11 DIAGNOSIS — E669 Obesity, unspecified: Secondary | ICD-10-CM | POA: Diagnosis not present

## 2022-07-11 DIAGNOSIS — E785 Hyperlipidemia, unspecified: Secondary | ICD-10-CM | POA: Diagnosis not present

## 2022-07-11 DIAGNOSIS — I4891 Unspecified atrial fibrillation: Secondary | ICD-10-CM | POA: Diagnosis not present

## 2022-07-11 DIAGNOSIS — G629 Polyneuropathy, unspecified: Secondary | ICD-10-CM | POA: Diagnosis not present

## 2022-07-11 DIAGNOSIS — I509 Heart failure, unspecified: Secondary | ICD-10-CM | POA: Diagnosis not present

## 2022-07-11 DIAGNOSIS — I110 Hypertensive heart disease with heart failure: Secondary | ICD-10-CM | POA: Diagnosis not present

## 2022-07-11 DIAGNOSIS — Z9181 History of falling: Secondary | ICD-10-CM | POA: Diagnosis not present

## 2022-07-11 DIAGNOSIS — M80062D Age-related osteoporosis with current pathological fracture, left lower leg, subsequent encounter for fracture with routine healing: Secondary | ICD-10-CM | POA: Diagnosis not present

## 2022-07-11 DIAGNOSIS — K219 Gastro-esophageal reflux disease without esophagitis: Secondary | ICD-10-CM | POA: Diagnosis not present

## 2022-07-11 DIAGNOSIS — Z6841 Body Mass Index (BMI) 40.0 and over, adult: Secondary | ICD-10-CM | POA: Diagnosis not present

## 2022-07-12 DIAGNOSIS — E785 Hyperlipidemia, unspecified: Secondary | ICD-10-CM | POA: Diagnosis not present

## 2022-07-12 DIAGNOSIS — K219 Gastro-esophageal reflux disease without esophagitis: Secondary | ICD-10-CM | POA: Diagnosis not present

## 2022-07-12 DIAGNOSIS — I509 Heart failure, unspecified: Secondary | ICD-10-CM | POA: Diagnosis not present

## 2022-07-12 DIAGNOSIS — Z7901 Long term (current) use of anticoagulants: Secondary | ICD-10-CM | POA: Diagnosis not present

## 2022-07-12 DIAGNOSIS — M109 Gout, unspecified: Secondary | ICD-10-CM | POA: Diagnosis not present

## 2022-07-12 DIAGNOSIS — Z9181 History of falling: Secondary | ICD-10-CM | POA: Diagnosis not present

## 2022-07-12 DIAGNOSIS — I110 Hypertensive heart disease with heart failure: Secondary | ICD-10-CM | POA: Diagnosis not present

## 2022-07-12 DIAGNOSIS — M80062D Age-related osteoporosis with current pathological fracture, left lower leg, subsequent encounter for fracture with routine healing: Secondary | ICD-10-CM | POA: Diagnosis not present

## 2022-07-12 DIAGNOSIS — E669 Obesity, unspecified: Secondary | ICD-10-CM | POA: Diagnosis not present

## 2022-07-12 DIAGNOSIS — G629 Polyneuropathy, unspecified: Secondary | ICD-10-CM | POA: Diagnosis not present

## 2022-07-12 DIAGNOSIS — Z6841 Body Mass Index (BMI) 40.0 and over, adult: Secondary | ICD-10-CM | POA: Diagnosis not present

## 2022-07-12 DIAGNOSIS — M25462 Effusion, left knee: Secondary | ICD-10-CM | POA: Diagnosis not present

## 2022-07-12 DIAGNOSIS — I4891 Unspecified atrial fibrillation: Secondary | ICD-10-CM | POA: Diagnosis not present

## 2022-07-15 DIAGNOSIS — E669 Obesity, unspecified: Secondary | ICD-10-CM | POA: Diagnosis not present

## 2022-07-15 DIAGNOSIS — I509 Heart failure, unspecified: Secondary | ICD-10-CM | POA: Diagnosis not present

## 2022-07-15 DIAGNOSIS — Z9181 History of falling: Secondary | ICD-10-CM | POA: Diagnosis not present

## 2022-07-15 DIAGNOSIS — K219 Gastro-esophageal reflux disease without esophagitis: Secondary | ICD-10-CM | POA: Diagnosis not present

## 2022-07-15 DIAGNOSIS — G629 Polyneuropathy, unspecified: Secondary | ICD-10-CM | POA: Diagnosis not present

## 2022-07-15 DIAGNOSIS — Z7901 Long term (current) use of anticoagulants: Secondary | ICD-10-CM | POA: Diagnosis not present

## 2022-07-15 DIAGNOSIS — M109 Gout, unspecified: Secondary | ICD-10-CM | POA: Diagnosis not present

## 2022-07-15 DIAGNOSIS — E785 Hyperlipidemia, unspecified: Secondary | ICD-10-CM | POA: Diagnosis not present

## 2022-07-15 DIAGNOSIS — I4891 Unspecified atrial fibrillation: Secondary | ICD-10-CM | POA: Diagnosis not present

## 2022-07-15 DIAGNOSIS — I110 Hypertensive heart disease with heart failure: Secondary | ICD-10-CM | POA: Diagnosis not present

## 2022-07-15 DIAGNOSIS — M25462 Effusion, left knee: Secondary | ICD-10-CM | POA: Diagnosis not present

## 2022-07-15 DIAGNOSIS — Z6841 Body Mass Index (BMI) 40.0 and over, adult: Secondary | ICD-10-CM | POA: Diagnosis not present

## 2022-07-15 DIAGNOSIS — M80062D Age-related osteoporosis with current pathological fracture, left lower leg, subsequent encounter for fracture with routine healing: Secondary | ICD-10-CM | POA: Diagnosis not present

## 2022-07-16 DIAGNOSIS — G629 Polyneuropathy, unspecified: Secondary | ICD-10-CM | POA: Diagnosis not present

## 2022-07-16 DIAGNOSIS — M80062D Age-related osteoporosis with current pathological fracture, left lower leg, subsequent encounter for fracture with routine healing: Secondary | ICD-10-CM | POA: Diagnosis not present

## 2022-07-16 DIAGNOSIS — K219 Gastro-esophageal reflux disease without esophagitis: Secondary | ICD-10-CM | POA: Diagnosis not present

## 2022-07-16 DIAGNOSIS — M109 Gout, unspecified: Secondary | ICD-10-CM | POA: Diagnosis not present

## 2022-07-16 DIAGNOSIS — E785 Hyperlipidemia, unspecified: Secondary | ICD-10-CM | POA: Diagnosis not present

## 2022-07-16 DIAGNOSIS — M25462 Effusion, left knee: Secondary | ICD-10-CM | POA: Diagnosis not present

## 2022-07-16 DIAGNOSIS — Z6841 Body Mass Index (BMI) 40.0 and over, adult: Secondary | ICD-10-CM | POA: Diagnosis not present

## 2022-07-16 DIAGNOSIS — I509 Heart failure, unspecified: Secondary | ICD-10-CM | POA: Diagnosis not present

## 2022-07-16 DIAGNOSIS — Z9181 History of falling: Secondary | ICD-10-CM | POA: Diagnosis not present

## 2022-07-16 DIAGNOSIS — I4891 Unspecified atrial fibrillation: Secondary | ICD-10-CM | POA: Diagnosis not present

## 2022-07-16 DIAGNOSIS — I110 Hypertensive heart disease with heart failure: Secondary | ICD-10-CM | POA: Diagnosis not present

## 2022-07-16 DIAGNOSIS — E669 Obesity, unspecified: Secondary | ICD-10-CM | POA: Diagnosis not present

## 2022-07-16 DIAGNOSIS — Z7901 Long term (current) use of anticoagulants: Secondary | ICD-10-CM | POA: Diagnosis not present

## 2022-07-17 DIAGNOSIS — M109 Gout, unspecified: Secondary | ICD-10-CM | POA: Diagnosis not present

## 2022-07-17 DIAGNOSIS — M25462 Effusion, left knee: Secondary | ICD-10-CM | POA: Diagnosis not present

## 2022-07-17 DIAGNOSIS — I110 Hypertensive heart disease with heart failure: Secondary | ICD-10-CM | POA: Diagnosis not present

## 2022-07-17 DIAGNOSIS — G629 Polyneuropathy, unspecified: Secondary | ICD-10-CM | POA: Diagnosis not present

## 2022-07-17 DIAGNOSIS — E785 Hyperlipidemia, unspecified: Secondary | ICD-10-CM | POA: Diagnosis not present

## 2022-07-17 DIAGNOSIS — K219 Gastro-esophageal reflux disease without esophagitis: Secondary | ICD-10-CM | POA: Diagnosis not present

## 2022-07-17 DIAGNOSIS — I4891 Unspecified atrial fibrillation: Secondary | ICD-10-CM | POA: Diagnosis not present

## 2022-07-17 DIAGNOSIS — Z6841 Body Mass Index (BMI) 40.0 and over, adult: Secondary | ICD-10-CM | POA: Diagnosis not present

## 2022-07-17 DIAGNOSIS — E669 Obesity, unspecified: Secondary | ICD-10-CM | POA: Diagnosis not present

## 2022-07-17 DIAGNOSIS — I509 Heart failure, unspecified: Secondary | ICD-10-CM | POA: Diagnosis not present

## 2022-07-17 DIAGNOSIS — Z7901 Long term (current) use of anticoagulants: Secondary | ICD-10-CM | POA: Diagnosis not present

## 2022-07-17 DIAGNOSIS — M80062D Age-related osteoporosis with current pathological fracture, left lower leg, subsequent encounter for fracture with routine healing: Secondary | ICD-10-CM | POA: Diagnosis not present

## 2022-07-17 DIAGNOSIS — Z9181 History of falling: Secondary | ICD-10-CM | POA: Diagnosis not present

## 2022-07-18 DIAGNOSIS — Z6841 Body Mass Index (BMI) 40.0 and over, adult: Secondary | ICD-10-CM | POA: Diagnosis not present

## 2022-07-18 DIAGNOSIS — E785 Hyperlipidemia, unspecified: Secondary | ICD-10-CM | POA: Diagnosis not present

## 2022-07-18 DIAGNOSIS — G629 Polyneuropathy, unspecified: Secondary | ICD-10-CM | POA: Diagnosis not present

## 2022-07-18 DIAGNOSIS — E669 Obesity, unspecified: Secondary | ICD-10-CM | POA: Diagnosis not present

## 2022-07-18 DIAGNOSIS — M109 Gout, unspecified: Secondary | ICD-10-CM | POA: Diagnosis not present

## 2022-07-18 DIAGNOSIS — Z9181 History of falling: Secondary | ICD-10-CM | POA: Diagnosis not present

## 2022-07-18 DIAGNOSIS — I110 Hypertensive heart disease with heart failure: Secondary | ICD-10-CM | POA: Diagnosis not present

## 2022-07-18 DIAGNOSIS — Z7901 Long term (current) use of anticoagulants: Secondary | ICD-10-CM | POA: Diagnosis not present

## 2022-07-18 DIAGNOSIS — I509 Heart failure, unspecified: Secondary | ICD-10-CM | POA: Diagnosis not present

## 2022-07-18 DIAGNOSIS — I4891 Unspecified atrial fibrillation: Secondary | ICD-10-CM | POA: Diagnosis not present

## 2022-07-18 DIAGNOSIS — M25462 Effusion, left knee: Secondary | ICD-10-CM | POA: Diagnosis not present

## 2022-07-18 DIAGNOSIS — K219 Gastro-esophageal reflux disease without esophagitis: Secondary | ICD-10-CM | POA: Diagnosis not present

## 2022-07-18 DIAGNOSIS — M80062D Age-related osteoporosis with current pathological fracture, left lower leg, subsequent encounter for fracture with routine healing: Secondary | ICD-10-CM | POA: Diagnosis not present

## 2022-07-21 DIAGNOSIS — Z993 Dependence on wheelchair: Secondary | ICD-10-CM | POA: Diagnosis not present

## 2022-07-21 DIAGNOSIS — I5032 Chronic diastolic (congestive) heart failure: Secondary | ICD-10-CM | POA: Diagnosis not present

## 2022-07-21 DIAGNOSIS — E662 Morbid (severe) obesity with alveolar hypoventilation: Secondary | ICD-10-CM | POA: Diagnosis not present

## 2022-07-21 DIAGNOSIS — M80062D Age-related osteoporosis with current pathological fracture, left lower leg, subsequent encounter for fracture with routine healing: Secondary | ICD-10-CM | POA: Diagnosis not present

## 2022-07-21 DIAGNOSIS — S82492D Other fracture of shaft of left fibula, subsequent encounter for closed fracture with routine healing: Secondary | ICD-10-CM | POA: Diagnosis not present

## 2022-07-21 DIAGNOSIS — M6281 Muscle weakness (generalized): Secondary | ICD-10-CM | POA: Diagnosis not present

## 2022-07-22 DIAGNOSIS — I4891 Unspecified atrial fibrillation: Secondary | ICD-10-CM | POA: Diagnosis not present

## 2022-07-22 DIAGNOSIS — I509 Heart failure, unspecified: Secondary | ICD-10-CM | POA: Diagnosis not present

## 2022-07-22 DIAGNOSIS — M80062D Age-related osteoporosis with current pathological fracture, left lower leg, subsequent encounter for fracture with routine healing: Secondary | ICD-10-CM | POA: Diagnosis not present

## 2022-07-22 DIAGNOSIS — M109 Gout, unspecified: Secondary | ICD-10-CM | POA: Diagnosis not present

## 2022-07-22 DIAGNOSIS — G629 Polyneuropathy, unspecified: Secondary | ICD-10-CM | POA: Diagnosis not present

## 2022-07-22 DIAGNOSIS — I110 Hypertensive heart disease with heart failure: Secondary | ICD-10-CM | POA: Diagnosis not present

## 2022-07-22 DIAGNOSIS — Z7901 Long term (current) use of anticoagulants: Secondary | ICD-10-CM | POA: Diagnosis not present

## 2022-07-22 DIAGNOSIS — E669 Obesity, unspecified: Secondary | ICD-10-CM | POA: Diagnosis not present

## 2022-07-22 DIAGNOSIS — E785 Hyperlipidemia, unspecified: Secondary | ICD-10-CM | POA: Diagnosis not present

## 2022-07-22 DIAGNOSIS — M25462 Effusion, left knee: Secondary | ICD-10-CM | POA: Diagnosis not present

## 2022-07-22 DIAGNOSIS — Z6841 Body Mass Index (BMI) 40.0 and over, adult: Secondary | ICD-10-CM | POA: Diagnosis not present

## 2022-07-22 DIAGNOSIS — K219 Gastro-esophageal reflux disease without esophagitis: Secondary | ICD-10-CM | POA: Diagnosis not present

## 2022-07-22 DIAGNOSIS — Z9181 History of falling: Secondary | ICD-10-CM | POA: Diagnosis not present

## 2022-07-24 DIAGNOSIS — Z6841 Body Mass Index (BMI) 40.0 and over, adult: Secondary | ICD-10-CM | POA: Diagnosis not present

## 2022-07-24 DIAGNOSIS — Z7901 Long term (current) use of anticoagulants: Secondary | ICD-10-CM | POA: Diagnosis not present

## 2022-07-24 DIAGNOSIS — E785 Hyperlipidemia, unspecified: Secondary | ICD-10-CM | POA: Diagnosis not present

## 2022-07-24 DIAGNOSIS — I509 Heart failure, unspecified: Secondary | ICD-10-CM | POA: Diagnosis not present

## 2022-07-24 DIAGNOSIS — I4891 Unspecified atrial fibrillation: Secondary | ICD-10-CM | POA: Diagnosis not present

## 2022-07-24 DIAGNOSIS — I110 Hypertensive heart disease with heart failure: Secondary | ICD-10-CM | POA: Diagnosis not present

## 2022-07-24 DIAGNOSIS — M80062D Age-related osteoporosis with current pathological fracture, left lower leg, subsequent encounter for fracture with routine healing: Secondary | ICD-10-CM | POA: Diagnosis not present

## 2022-07-24 DIAGNOSIS — G629 Polyneuropathy, unspecified: Secondary | ICD-10-CM | POA: Diagnosis not present

## 2022-07-24 DIAGNOSIS — M25462 Effusion, left knee: Secondary | ICD-10-CM | POA: Diagnosis not present

## 2022-07-24 DIAGNOSIS — Z9181 History of falling: Secondary | ICD-10-CM | POA: Diagnosis not present

## 2022-07-24 DIAGNOSIS — E669 Obesity, unspecified: Secondary | ICD-10-CM | POA: Diagnosis not present

## 2022-07-24 DIAGNOSIS — M109 Gout, unspecified: Secondary | ICD-10-CM | POA: Diagnosis not present

## 2022-07-24 DIAGNOSIS — K219 Gastro-esophageal reflux disease without esophagitis: Secondary | ICD-10-CM | POA: Diagnosis not present

## 2022-07-25 DIAGNOSIS — I509 Heart failure, unspecified: Secondary | ICD-10-CM | POA: Diagnosis not present

## 2022-07-25 DIAGNOSIS — I110 Hypertensive heart disease with heart failure: Secondary | ICD-10-CM | POA: Diagnosis not present

## 2022-07-25 DIAGNOSIS — Z6841 Body Mass Index (BMI) 40.0 and over, adult: Secondary | ICD-10-CM | POA: Diagnosis not present

## 2022-07-25 DIAGNOSIS — G629 Polyneuropathy, unspecified: Secondary | ICD-10-CM | POA: Diagnosis not present

## 2022-07-25 DIAGNOSIS — M80062D Age-related osteoporosis with current pathological fracture, left lower leg, subsequent encounter for fracture with routine healing: Secondary | ICD-10-CM | POA: Diagnosis not present

## 2022-07-25 DIAGNOSIS — E669 Obesity, unspecified: Secondary | ICD-10-CM | POA: Diagnosis not present

## 2022-07-25 DIAGNOSIS — Z9181 History of falling: Secondary | ICD-10-CM | POA: Diagnosis not present

## 2022-07-25 DIAGNOSIS — I4891 Unspecified atrial fibrillation: Secondary | ICD-10-CM | POA: Diagnosis not present

## 2022-07-25 DIAGNOSIS — E785 Hyperlipidemia, unspecified: Secondary | ICD-10-CM | POA: Diagnosis not present

## 2022-07-25 DIAGNOSIS — M25462 Effusion, left knee: Secondary | ICD-10-CM | POA: Diagnosis not present

## 2022-07-25 DIAGNOSIS — K219 Gastro-esophageal reflux disease without esophagitis: Secondary | ICD-10-CM | POA: Diagnosis not present

## 2022-07-25 DIAGNOSIS — Z7901 Long term (current) use of anticoagulants: Secondary | ICD-10-CM | POA: Diagnosis not present

## 2022-07-25 DIAGNOSIS — M109 Gout, unspecified: Secondary | ICD-10-CM | POA: Diagnosis not present

## 2022-07-26 DIAGNOSIS — E662 Morbid (severe) obesity with alveolar hypoventilation: Secondary | ICD-10-CM | POA: Diagnosis not present

## 2022-07-26 DIAGNOSIS — S82492D Other fracture of shaft of left fibula, subsequent encounter for closed fracture with routine healing: Secondary | ICD-10-CM | POA: Diagnosis not present

## 2022-07-26 DIAGNOSIS — M6281 Muscle weakness (generalized): Secondary | ICD-10-CM | POA: Diagnosis not present

## 2022-07-29 DIAGNOSIS — I509 Heart failure, unspecified: Secondary | ICD-10-CM | POA: Diagnosis not present

## 2022-07-29 DIAGNOSIS — E785 Hyperlipidemia, unspecified: Secondary | ICD-10-CM | POA: Diagnosis not present

## 2022-07-29 DIAGNOSIS — E669 Obesity, unspecified: Secondary | ICD-10-CM | POA: Diagnosis not present

## 2022-07-29 DIAGNOSIS — I110 Hypertensive heart disease with heart failure: Secondary | ICD-10-CM | POA: Diagnosis not present

## 2022-07-29 DIAGNOSIS — K219 Gastro-esophageal reflux disease without esophagitis: Secondary | ICD-10-CM | POA: Diagnosis not present

## 2022-07-29 DIAGNOSIS — G629 Polyneuropathy, unspecified: Secondary | ICD-10-CM | POA: Diagnosis not present

## 2022-07-29 DIAGNOSIS — M25462 Effusion, left knee: Secondary | ICD-10-CM | POA: Diagnosis not present

## 2022-07-29 DIAGNOSIS — Z9181 History of falling: Secondary | ICD-10-CM | POA: Diagnosis not present

## 2022-07-29 DIAGNOSIS — Z6841 Body Mass Index (BMI) 40.0 and over, adult: Secondary | ICD-10-CM | POA: Diagnosis not present

## 2022-07-29 DIAGNOSIS — M109 Gout, unspecified: Secondary | ICD-10-CM | POA: Diagnosis not present

## 2022-07-29 DIAGNOSIS — M80062D Age-related osteoporosis with current pathological fracture, left lower leg, subsequent encounter for fracture with routine healing: Secondary | ICD-10-CM | POA: Diagnosis not present

## 2022-07-29 DIAGNOSIS — Z7901 Long term (current) use of anticoagulants: Secondary | ICD-10-CM | POA: Diagnosis not present

## 2022-07-29 DIAGNOSIS — I4891 Unspecified atrial fibrillation: Secondary | ICD-10-CM | POA: Diagnosis not present

## 2022-07-30 DIAGNOSIS — I4891 Unspecified atrial fibrillation: Secondary | ICD-10-CM | POA: Diagnosis not present

## 2022-07-30 DIAGNOSIS — I509 Heart failure, unspecified: Secondary | ICD-10-CM | POA: Diagnosis not present

## 2022-07-30 DIAGNOSIS — Z7901 Long term (current) use of anticoagulants: Secondary | ICD-10-CM | POA: Diagnosis not present

## 2022-07-30 DIAGNOSIS — Z9181 History of falling: Secondary | ICD-10-CM | POA: Diagnosis not present

## 2022-07-30 DIAGNOSIS — M80062D Age-related osteoporosis with current pathological fracture, left lower leg, subsequent encounter for fracture with routine healing: Secondary | ICD-10-CM | POA: Diagnosis not present

## 2022-07-30 DIAGNOSIS — G629 Polyneuropathy, unspecified: Secondary | ICD-10-CM | POA: Diagnosis not present

## 2022-07-30 DIAGNOSIS — K219 Gastro-esophageal reflux disease without esophagitis: Secondary | ICD-10-CM | POA: Diagnosis not present

## 2022-07-30 DIAGNOSIS — I110 Hypertensive heart disease with heart failure: Secondary | ICD-10-CM | POA: Diagnosis not present

## 2022-07-30 DIAGNOSIS — M25462 Effusion, left knee: Secondary | ICD-10-CM | POA: Diagnosis not present

## 2022-07-30 DIAGNOSIS — E669 Obesity, unspecified: Secondary | ICD-10-CM | POA: Diagnosis not present

## 2022-07-30 DIAGNOSIS — Z6841 Body Mass Index (BMI) 40.0 and over, adult: Secondary | ICD-10-CM | POA: Diagnosis not present

## 2022-07-30 DIAGNOSIS — E785 Hyperlipidemia, unspecified: Secondary | ICD-10-CM | POA: Diagnosis not present

## 2022-07-30 DIAGNOSIS — M109 Gout, unspecified: Secondary | ICD-10-CM | POA: Diagnosis not present

## 2022-08-01 DIAGNOSIS — M80062D Age-related osteoporosis with current pathological fracture, left lower leg, subsequent encounter for fracture with routine healing: Secondary | ICD-10-CM | POA: Diagnosis not present

## 2022-08-01 DIAGNOSIS — E669 Obesity, unspecified: Secondary | ICD-10-CM | POA: Diagnosis not present

## 2022-08-01 DIAGNOSIS — E785 Hyperlipidemia, unspecified: Secondary | ICD-10-CM | POA: Diagnosis not present

## 2022-08-01 DIAGNOSIS — M25462 Effusion, left knee: Secondary | ICD-10-CM | POA: Diagnosis not present

## 2022-08-01 DIAGNOSIS — Z7901 Long term (current) use of anticoagulants: Secondary | ICD-10-CM | POA: Diagnosis not present

## 2022-08-01 DIAGNOSIS — M109 Gout, unspecified: Secondary | ICD-10-CM | POA: Diagnosis not present

## 2022-08-01 DIAGNOSIS — K219 Gastro-esophageal reflux disease without esophagitis: Secondary | ICD-10-CM | POA: Diagnosis not present

## 2022-08-01 DIAGNOSIS — I110 Hypertensive heart disease with heart failure: Secondary | ICD-10-CM | POA: Diagnosis not present

## 2022-08-01 DIAGNOSIS — Z6841 Body Mass Index (BMI) 40.0 and over, adult: Secondary | ICD-10-CM | POA: Diagnosis not present

## 2022-08-01 DIAGNOSIS — G629 Polyneuropathy, unspecified: Secondary | ICD-10-CM | POA: Diagnosis not present

## 2022-08-01 DIAGNOSIS — I509 Heart failure, unspecified: Secondary | ICD-10-CM | POA: Diagnosis not present

## 2022-08-01 DIAGNOSIS — Z9181 History of falling: Secondary | ICD-10-CM | POA: Diagnosis not present

## 2022-08-01 DIAGNOSIS — I4891 Unspecified atrial fibrillation: Secondary | ICD-10-CM | POA: Diagnosis not present

## 2022-08-02 DIAGNOSIS — E785 Hyperlipidemia, unspecified: Secondary | ICD-10-CM | POA: Diagnosis not present

## 2022-08-02 DIAGNOSIS — Z6841 Body Mass Index (BMI) 40.0 and over, adult: Secondary | ICD-10-CM | POA: Diagnosis not present

## 2022-08-02 DIAGNOSIS — Z7901 Long term (current) use of anticoagulants: Secondary | ICD-10-CM | POA: Diagnosis not present

## 2022-08-02 DIAGNOSIS — M80062D Age-related osteoporosis with current pathological fracture, left lower leg, subsequent encounter for fracture with routine healing: Secondary | ICD-10-CM | POA: Diagnosis not present

## 2022-08-02 DIAGNOSIS — I509 Heart failure, unspecified: Secondary | ICD-10-CM | POA: Diagnosis not present

## 2022-08-02 DIAGNOSIS — G629 Polyneuropathy, unspecified: Secondary | ICD-10-CM | POA: Diagnosis not present

## 2022-08-02 DIAGNOSIS — I4891 Unspecified atrial fibrillation: Secondary | ICD-10-CM | POA: Diagnosis not present

## 2022-08-02 DIAGNOSIS — I110 Hypertensive heart disease with heart failure: Secondary | ICD-10-CM | POA: Diagnosis not present

## 2022-08-02 DIAGNOSIS — M25462 Effusion, left knee: Secondary | ICD-10-CM | POA: Diagnosis not present

## 2022-08-02 DIAGNOSIS — M109 Gout, unspecified: Secondary | ICD-10-CM | POA: Diagnosis not present

## 2022-08-02 DIAGNOSIS — E669 Obesity, unspecified: Secondary | ICD-10-CM | POA: Diagnosis not present

## 2022-08-02 DIAGNOSIS — Z9181 History of falling: Secondary | ICD-10-CM | POA: Diagnosis not present

## 2022-08-02 DIAGNOSIS — K219 Gastro-esophageal reflux disease without esophagitis: Secondary | ICD-10-CM | POA: Diagnosis not present

## 2022-08-05 DIAGNOSIS — Z6841 Body Mass Index (BMI) 40.0 and over, adult: Secondary | ICD-10-CM | POA: Diagnosis not present

## 2022-08-05 DIAGNOSIS — M109 Gout, unspecified: Secondary | ICD-10-CM | POA: Diagnosis not present

## 2022-08-05 DIAGNOSIS — I509 Heart failure, unspecified: Secondary | ICD-10-CM | POA: Diagnosis not present

## 2022-08-05 DIAGNOSIS — Z7901 Long term (current) use of anticoagulants: Secondary | ICD-10-CM | POA: Diagnosis not present

## 2022-08-05 DIAGNOSIS — E785 Hyperlipidemia, unspecified: Secondary | ICD-10-CM | POA: Diagnosis not present

## 2022-08-05 DIAGNOSIS — I110 Hypertensive heart disease with heart failure: Secondary | ICD-10-CM | POA: Diagnosis not present

## 2022-08-05 DIAGNOSIS — I4891 Unspecified atrial fibrillation: Secondary | ICD-10-CM | POA: Diagnosis not present

## 2022-08-05 DIAGNOSIS — Z9181 History of falling: Secondary | ICD-10-CM | POA: Diagnosis not present

## 2022-08-05 DIAGNOSIS — E669 Obesity, unspecified: Secondary | ICD-10-CM | POA: Diagnosis not present

## 2022-08-05 DIAGNOSIS — G629 Polyneuropathy, unspecified: Secondary | ICD-10-CM | POA: Diagnosis not present

## 2022-08-05 DIAGNOSIS — M80062D Age-related osteoporosis with current pathological fracture, left lower leg, subsequent encounter for fracture with routine healing: Secondary | ICD-10-CM | POA: Diagnosis not present

## 2022-08-05 DIAGNOSIS — K219 Gastro-esophageal reflux disease without esophagitis: Secondary | ICD-10-CM | POA: Diagnosis not present

## 2022-08-05 DIAGNOSIS — M25462 Effusion, left knee: Secondary | ICD-10-CM | POA: Diagnosis not present

## 2022-08-08 DIAGNOSIS — I509 Heart failure, unspecified: Secondary | ICD-10-CM | POA: Diagnosis not present

## 2022-08-08 DIAGNOSIS — G629 Polyneuropathy, unspecified: Secondary | ICD-10-CM | POA: Diagnosis not present

## 2022-08-08 DIAGNOSIS — Z7901 Long term (current) use of anticoagulants: Secondary | ICD-10-CM | POA: Diagnosis not present

## 2022-08-08 DIAGNOSIS — I4891 Unspecified atrial fibrillation: Secondary | ICD-10-CM | POA: Diagnosis not present

## 2022-08-08 DIAGNOSIS — I110 Hypertensive heart disease with heart failure: Secondary | ICD-10-CM | POA: Diagnosis not present

## 2022-08-08 DIAGNOSIS — Z6841 Body Mass Index (BMI) 40.0 and over, adult: Secondary | ICD-10-CM | POA: Diagnosis not present

## 2022-08-08 DIAGNOSIS — M109 Gout, unspecified: Secondary | ICD-10-CM | POA: Diagnosis not present

## 2022-08-08 DIAGNOSIS — M25462 Effusion, left knee: Secondary | ICD-10-CM | POA: Diagnosis not present

## 2022-08-08 DIAGNOSIS — Z9181 History of falling: Secondary | ICD-10-CM | POA: Diagnosis not present

## 2022-08-08 DIAGNOSIS — M80062D Age-related osteoporosis with current pathological fracture, left lower leg, subsequent encounter for fracture with routine healing: Secondary | ICD-10-CM | POA: Diagnosis not present

## 2022-08-08 DIAGNOSIS — E669 Obesity, unspecified: Secondary | ICD-10-CM | POA: Diagnosis not present

## 2022-08-08 DIAGNOSIS — E785 Hyperlipidemia, unspecified: Secondary | ICD-10-CM | POA: Diagnosis not present

## 2022-08-08 DIAGNOSIS — K219 Gastro-esophageal reflux disease without esophagitis: Secondary | ICD-10-CM | POA: Diagnosis not present

## 2022-08-13 DIAGNOSIS — Z9181 History of falling: Secondary | ICD-10-CM | POA: Diagnosis not present

## 2022-08-13 DIAGNOSIS — I509 Heart failure, unspecified: Secondary | ICD-10-CM | POA: Diagnosis not present

## 2022-08-13 DIAGNOSIS — E785 Hyperlipidemia, unspecified: Secondary | ICD-10-CM | POA: Diagnosis not present

## 2022-08-13 DIAGNOSIS — M80062D Age-related osteoporosis with current pathological fracture, left lower leg, subsequent encounter for fracture with routine healing: Secondary | ICD-10-CM | POA: Diagnosis not present

## 2022-08-13 DIAGNOSIS — K219 Gastro-esophageal reflux disease without esophagitis: Secondary | ICD-10-CM | POA: Diagnosis not present

## 2022-08-13 DIAGNOSIS — Z7901 Long term (current) use of anticoagulants: Secondary | ICD-10-CM | POA: Diagnosis not present

## 2022-08-13 DIAGNOSIS — I4891 Unspecified atrial fibrillation: Secondary | ICD-10-CM | POA: Diagnosis not present

## 2022-08-13 DIAGNOSIS — M109 Gout, unspecified: Secondary | ICD-10-CM | POA: Diagnosis not present

## 2022-08-13 DIAGNOSIS — E669 Obesity, unspecified: Secondary | ICD-10-CM | POA: Diagnosis not present

## 2022-08-13 DIAGNOSIS — G629 Polyneuropathy, unspecified: Secondary | ICD-10-CM | POA: Diagnosis not present

## 2022-08-13 DIAGNOSIS — M25462 Effusion, left knee: Secondary | ICD-10-CM | POA: Diagnosis not present

## 2022-08-13 DIAGNOSIS — Z6841 Body Mass Index (BMI) 40.0 and over, adult: Secondary | ICD-10-CM | POA: Diagnosis not present

## 2022-08-13 DIAGNOSIS — I110 Hypertensive heart disease with heart failure: Secondary | ICD-10-CM | POA: Diagnosis not present

## 2022-08-15 DIAGNOSIS — Z7901 Long term (current) use of anticoagulants: Secondary | ICD-10-CM | POA: Diagnosis not present

## 2022-08-15 DIAGNOSIS — Z6841 Body Mass Index (BMI) 40.0 and over, adult: Secondary | ICD-10-CM | POA: Diagnosis not present

## 2022-08-15 DIAGNOSIS — M25462 Effusion, left knee: Secondary | ICD-10-CM | POA: Diagnosis not present

## 2022-08-15 DIAGNOSIS — Z9181 History of falling: Secondary | ICD-10-CM | POA: Diagnosis not present

## 2022-08-15 DIAGNOSIS — M80062D Age-related osteoporosis with current pathological fracture, left lower leg, subsequent encounter for fracture with routine healing: Secondary | ICD-10-CM | POA: Diagnosis not present

## 2022-08-15 DIAGNOSIS — I509 Heart failure, unspecified: Secondary | ICD-10-CM | POA: Diagnosis not present

## 2022-08-15 DIAGNOSIS — G629 Polyneuropathy, unspecified: Secondary | ICD-10-CM | POA: Diagnosis not present

## 2022-08-15 DIAGNOSIS — I4891 Unspecified atrial fibrillation: Secondary | ICD-10-CM | POA: Diagnosis not present

## 2022-08-15 DIAGNOSIS — I110 Hypertensive heart disease with heart failure: Secondary | ICD-10-CM | POA: Diagnosis not present

## 2022-08-15 DIAGNOSIS — E669 Obesity, unspecified: Secondary | ICD-10-CM | POA: Diagnosis not present

## 2022-08-15 DIAGNOSIS — M109 Gout, unspecified: Secondary | ICD-10-CM | POA: Diagnosis not present

## 2022-08-15 DIAGNOSIS — K219 Gastro-esophageal reflux disease without esophagitis: Secondary | ICD-10-CM | POA: Diagnosis not present

## 2022-08-15 DIAGNOSIS — E785 Hyperlipidemia, unspecified: Secondary | ICD-10-CM | POA: Diagnosis not present

## 2022-08-16 DIAGNOSIS — Z7901 Long term (current) use of anticoagulants: Secondary | ICD-10-CM | POA: Diagnosis not present

## 2022-08-16 DIAGNOSIS — Z9181 History of falling: Secondary | ICD-10-CM | POA: Diagnosis not present

## 2022-08-16 DIAGNOSIS — I509 Heart failure, unspecified: Secondary | ICD-10-CM | POA: Diagnosis not present

## 2022-08-16 DIAGNOSIS — E669 Obesity, unspecified: Secondary | ICD-10-CM | POA: Diagnosis not present

## 2022-08-16 DIAGNOSIS — I110 Hypertensive heart disease with heart failure: Secondary | ICD-10-CM | POA: Diagnosis not present

## 2022-08-16 DIAGNOSIS — M109 Gout, unspecified: Secondary | ICD-10-CM | POA: Diagnosis not present

## 2022-08-16 DIAGNOSIS — G629 Polyneuropathy, unspecified: Secondary | ICD-10-CM | POA: Diagnosis not present

## 2022-08-16 DIAGNOSIS — I4891 Unspecified atrial fibrillation: Secondary | ICD-10-CM | POA: Diagnosis not present

## 2022-08-16 DIAGNOSIS — Z6841 Body Mass Index (BMI) 40.0 and over, adult: Secondary | ICD-10-CM | POA: Diagnosis not present

## 2022-08-16 DIAGNOSIS — K219 Gastro-esophageal reflux disease without esophagitis: Secondary | ICD-10-CM | POA: Diagnosis not present

## 2022-08-16 DIAGNOSIS — E785 Hyperlipidemia, unspecified: Secondary | ICD-10-CM | POA: Diagnosis not present

## 2022-08-16 DIAGNOSIS — M80062D Age-related osteoporosis with current pathological fracture, left lower leg, subsequent encounter for fracture with routine healing: Secondary | ICD-10-CM | POA: Diagnosis not present

## 2022-08-16 DIAGNOSIS — M25462 Effusion, left knee: Secondary | ICD-10-CM | POA: Diagnosis not present

## 2022-08-19 DIAGNOSIS — M109 Gout, unspecified: Secondary | ICD-10-CM | POA: Diagnosis not present

## 2022-08-19 DIAGNOSIS — Z9181 History of falling: Secondary | ICD-10-CM | POA: Diagnosis not present

## 2022-08-19 DIAGNOSIS — G629 Polyneuropathy, unspecified: Secondary | ICD-10-CM | POA: Diagnosis not present

## 2022-08-19 DIAGNOSIS — M80062D Age-related osteoporosis with current pathological fracture, left lower leg, subsequent encounter for fracture with routine healing: Secondary | ICD-10-CM | POA: Diagnosis not present

## 2022-08-19 DIAGNOSIS — I509 Heart failure, unspecified: Secondary | ICD-10-CM | POA: Diagnosis not present

## 2022-08-19 DIAGNOSIS — I110 Hypertensive heart disease with heart failure: Secondary | ICD-10-CM | POA: Diagnosis not present

## 2022-08-19 DIAGNOSIS — E669 Obesity, unspecified: Secondary | ICD-10-CM | POA: Diagnosis not present

## 2022-08-19 DIAGNOSIS — E785 Hyperlipidemia, unspecified: Secondary | ICD-10-CM | POA: Diagnosis not present

## 2022-08-19 DIAGNOSIS — M25462 Effusion, left knee: Secondary | ICD-10-CM | POA: Diagnosis not present

## 2022-08-19 DIAGNOSIS — K219 Gastro-esophageal reflux disease without esophagitis: Secondary | ICD-10-CM | POA: Diagnosis not present

## 2022-08-19 DIAGNOSIS — Z7901 Long term (current) use of anticoagulants: Secondary | ICD-10-CM | POA: Diagnosis not present

## 2022-08-19 DIAGNOSIS — Z6841 Body Mass Index (BMI) 40.0 and over, adult: Secondary | ICD-10-CM | POA: Diagnosis not present

## 2022-08-19 DIAGNOSIS — I4891 Unspecified atrial fibrillation: Secondary | ICD-10-CM | POA: Diagnosis not present

## 2022-08-20 DIAGNOSIS — I5032 Chronic diastolic (congestive) heart failure: Secondary | ICD-10-CM | POA: Diagnosis not present

## 2022-08-20 DIAGNOSIS — S82492D Other fracture of shaft of left fibula, subsequent encounter for closed fracture with routine healing: Secondary | ICD-10-CM | POA: Diagnosis not present

## 2022-08-20 DIAGNOSIS — Z993 Dependence on wheelchair: Secondary | ICD-10-CM | POA: Diagnosis not present

## 2022-08-20 DIAGNOSIS — E662 Morbid (severe) obesity with alveolar hypoventilation: Secondary | ICD-10-CM | POA: Diagnosis not present

## 2022-08-20 DIAGNOSIS — M6281 Muscle weakness (generalized): Secondary | ICD-10-CM | POA: Diagnosis not present

## 2022-08-20 DIAGNOSIS — M80062D Age-related osteoporosis with current pathological fracture, left lower leg, subsequent encounter for fracture with routine healing: Secondary | ICD-10-CM | POA: Diagnosis not present

## 2022-08-21 DIAGNOSIS — Z7901 Long term (current) use of anticoagulants: Secondary | ICD-10-CM | POA: Diagnosis not present

## 2022-08-21 DIAGNOSIS — I110 Hypertensive heart disease with heart failure: Secondary | ICD-10-CM | POA: Diagnosis not present

## 2022-08-21 DIAGNOSIS — I509 Heart failure, unspecified: Secondary | ICD-10-CM | POA: Diagnosis not present

## 2022-08-21 DIAGNOSIS — K219 Gastro-esophageal reflux disease without esophagitis: Secondary | ICD-10-CM | POA: Diagnosis not present

## 2022-08-21 DIAGNOSIS — I4891 Unspecified atrial fibrillation: Secondary | ICD-10-CM | POA: Diagnosis not present

## 2022-08-21 DIAGNOSIS — M109 Gout, unspecified: Secondary | ICD-10-CM | POA: Diagnosis not present

## 2022-08-21 DIAGNOSIS — Z9181 History of falling: Secondary | ICD-10-CM | POA: Diagnosis not present

## 2022-08-21 DIAGNOSIS — M25462 Effusion, left knee: Secondary | ICD-10-CM | POA: Diagnosis not present

## 2022-08-21 DIAGNOSIS — Z6841 Body Mass Index (BMI) 40.0 and over, adult: Secondary | ICD-10-CM | POA: Diagnosis not present

## 2022-08-21 DIAGNOSIS — M80062D Age-related osteoporosis with current pathological fracture, left lower leg, subsequent encounter for fracture with routine healing: Secondary | ICD-10-CM | POA: Diagnosis not present

## 2022-08-21 DIAGNOSIS — G629 Polyneuropathy, unspecified: Secondary | ICD-10-CM | POA: Diagnosis not present

## 2022-08-21 DIAGNOSIS — E669 Obesity, unspecified: Secondary | ICD-10-CM | POA: Diagnosis not present

## 2022-08-21 DIAGNOSIS — E785 Hyperlipidemia, unspecified: Secondary | ICD-10-CM | POA: Diagnosis not present

## 2022-08-22 DIAGNOSIS — M109 Gout, unspecified: Secondary | ICD-10-CM | POA: Diagnosis not present

## 2022-08-22 DIAGNOSIS — E785 Hyperlipidemia, unspecified: Secondary | ICD-10-CM | POA: Diagnosis not present

## 2022-08-22 DIAGNOSIS — M80062D Age-related osteoporosis with current pathological fracture, left lower leg, subsequent encounter for fracture with routine healing: Secondary | ICD-10-CM | POA: Diagnosis not present

## 2022-08-22 DIAGNOSIS — Z7901 Long term (current) use of anticoagulants: Secondary | ICD-10-CM | POA: Diagnosis not present

## 2022-08-22 DIAGNOSIS — E669 Obesity, unspecified: Secondary | ICD-10-CM | POA: Diagnosis not present

## 2022-08-22 DIAGNOSIS — G629 Polyneuropathy, unspecified: Secondary | ICD-10-CM | POA: Diagnosis not present

## 2022-08-22 DIAGNOSIS — K219 Gastro-esophageal reflux disease without esophagitis: Secondary | ICD-10-CM | POA: Diagnosis not present

## 2022-08-22 DIAGNOSIS — Z9181 History of falling: Secondary | ICD-10-CM | POA: Diagnosis not present

## 2022-08-22 DIAGNOSIS — I4891 Unspecified atrial fibrillation: Secondary | ICD-10-CM | POA: Diagnosis not present

## 2022-08-22 DIAGNOSIS — I509 Heart failure, unspecified: Secondary | ICD-10-CM | POA: Diagnosis not present

## 2022-08-22 DIAGNOSIS — I110 Hypertensive heart disease with heart failure: Secondary | ICD-10-CM | POA: Diagnosis not present

## 2022-08-22 DIAGNOSIS — M25462 Effusion, left knee: Secondary | ICD-10-CM | POA: Diagnosis not present

## 2022-08-22 DIAGNOSIS — Z6841 Body Mass Index (BMI) 40.0 and over, adult: Secondary | ICD-10-CM | POA: Diagnosis not present

## 2022-08-23 DIAGNOSIS — G629 Polyneuropathy, unspecified: Secondary | ICD-10-CM | POA: Diagnosis not present

## 2022-08-23 DIAGNOSIS — M80062D Age-related osteoporosis with current pathological fracture, left lower leg, subsequent encounter for fracture with routine healing: Secondary | ICD-10-CM | POA: Diagnosis not present

## 2022-08-23 DIAGNOSIS — Z7901 Long term (current) use of anticoagulants: Secondary | ICD-10-CM | POA: Diagnosis not present

## 2022-08-23 DIAGNOSIS — Z9181 History of falling: Secondary | ICD-10-CM | POA: Diagnosis not present

## 2022-08-23 DIAGNOSIS — E669 Obesity, unspecified: Secondary | ICD-10-CM | POA: Diagnosis not present

## 2022-08-23 DIAGNOSIS — K219 Gastro-esophageal reflux disease without esophagitis: Secondary | ICD-10-CM | POA: Diagnosis not present

## 2022-08-23 DIAGNOSIS — E785 Hyperlipidemia, unspecified: Secondary | ICD-10-CM | POA: Diagnosis not present

## 2022-08-23 DIAGNOSIS — I509 Heart failure, unspecified: Secondary | ICD-10-CM | POA: Diagnosis not present

## 2022-08-23 DIAGNOSIS — M109 Gout, unspecified: Secondary | ICD-10-CM | POA: Diagnosis not present

## 2022-08-23 DIAGNOSIS — I4891 Unspecified atrial fibrillation: Secondary | ICD-10-CM | POA: Diagnosis not present

## 2022-08-23 DIAGNOSIS — I110 Hypertensive heart disease with heart failure: Secondary | ICD-10-CM | POA: Diagnosis not present

## 2022-08-23 DIAGNOSIS — Z6841 Body Mass Index (BMI) 40.0 and over, adult: Secondary | ICD-10-CM | POA: Diagnosis not present

## 2022-08-25 DIAGNOSIS — M6281 Muscle weakness (generalized): Secondary | ICD-10-CM | POA: Diagnosis not present

## 2022-08-25 DIAGNOSIS — I5032 Chronic diastolic (congestive) heart failure: Secondary | ICD-10-CM | POA: Diagnosis not present

## 2022-08-25 DIAGNOSIS — Z993 Dependence on wheelchair: Secondary | ICD-10-CM | POA: Diagnosis not present

## 2022-08-25 DIAGNOSIS — S82492D Other fracture of shaft of left fibula, subsequent encounter for closed fracture with routine healing: Secondary | ICD-10-CM | POA: Diagnosis not present

## 2022-08-25 DIAGNOSIS — E662 Morbid (severe) obesity with alveolar hypoventilation: Secondary | ICD-10-CM | POA: Diagnosis not present

## 2022-08-27 DIAGNOSIS — I4891 Unspecified atrial fibrillation: Secondary | ICD-10-CM | POA: Diagnosis not present

## 2022-08-27 DIAGNOSIS — I509 Heart failure, unspecified: Secondary | ICD-10-CM | POA: Diagnosis not present

## 2022-08-27 DIAGNOSIS — M109 Gout, unspecified: Secondary | ICD-10-CM | POA: Diagnosis not present

## 2022-08-27 DIAGNOSIS — M80062D Age-related osteoporosis with current pathological fracture, left lower leg, subsequent encounter for fracture with routine healing: Secondary | ICD-10-CM | POA: Diagnosis not present

## 2022-08-27 DIAGNOSIS — E785 Hyperlipidemia, unspecified: Secondary | ICD-10-CM | POA: Diagnosis not present

## 2022-08-27 DIAGNOSIS — G629 Polyneuropathy, unspecified: Secondary | ICD-10-CM | POA: Diagnosis not present

## 2022-08-27 DIAGNOSIS — K219 Gastro-esophageal reflux disease without esophagitis: Secondary | ICD-10-CM | POA: Diagnosis not present

## 2022-08-27 DIAGNOSIS — E669 Obesity, unspecified: Secondary | ICD-10-CM | POA: Diagnosis not present

## 2022-08-27 DIAGNOSIS — Z9181 History of falling: Secondary | ICD-10-CM | POA: Diagnosis not present

## 2022-08-27 DIAGNOSIS — Z6841 Body Mass Index (BMI) 40.0 and over, adult: Secondary | ICD-10-CM | POA: Diagnosis not present

## 2022-08-27 DIAGNOSIS — I110 Hypertensive heart disease with heart failure: Secondary | ICD-10-CM | POA: Diagnosis not present

## 2022-08-27 DIAGNOSIS — Z7901 Long term (current) use of anticoagulants: Secondary | ICD-10-CM | POA: Diagnosis not present

## 2022-09-02 DIAGNOSIS — G629 Polyneuropathy, unspecified: Secondary | ICD-10-CM | POA: Diagnosis not present

## 2022-09-02 DIAGNOSIS — M109 Gout, unspecified: Secondary | ICD-10-CM | POA: Diagnosis not present

## 2022-09-02 DIAGNOSIS — I4891 Unspecified atrial fibrillation: Secondary | ICD-10-CM | POA: Diagnosis not present

## 2022-09-02 DIAGNOSIS — Z6841 Body Mass Index (BMI) 40.0 and over, adult: Secondary | ICD-10-CM | POA: Diagnosis not present

## 2022-09-02 DIAGNOSIS — E785 Hyperlipidemia, unspecified: Secondary | ICD-10-CM | POA: Diagnosis not present

## 2022-09-02 DIAGNOSIS — I509 Heart failure, unspecified: Secondary | ICD-10-CM | POA: Diagnosis not present

## 2022-09-02 DIAGNOSIS — Z9181 History of falling: Secondary | ICD-10-CM | POA: Diagnosis not present

## 2022-09-02 DIAGNOSIS — K219 Gastro-esophageal reflux disease without esophagitis: Secondary | ICD-10-CM | POA: Diagnosis not present

## 2022-09-02 DIAGNOSIS — E669 Obesity, unspecified: Secondary | ICD-10-CM | POA: Diagnosis not present

## 2022-09-02 DIAGNOSIS — Z7901 Long term (current) use of anticoagulants: Secondary | ICD-10-CM | POA: Diagnosis not present

## 2022-09-02 DIAGNOSIS — I110 Hypertensive heart disease with heart failure: Secondary | ICD-10-CM | POA: Diagnosis not present

## 2022-09-02 DIAGNOSIS — M80062D Age-related osteoporosis with current pathological fracture, left lower leg, subsequent encounter for fracture with routine healing: Secondary | ICD-10-CM | POA: Diagnosis not present

## 2022-09-03 DIAGNOSIS — E785 Hyperlipidemia, unspecified: Secondary | ICD-10-CM | POA: Diagnosis not present

## 2022-09-03 DIAGNOSIS — Z9181 History of falling: Secondary | ICD-10-CM | POA: Diagnosis not present

## 2022-09-03 DIAGNOSIS — M80062D Age-related osteoporosis with current pathological fracture, left lower leg, subsequent encounter for fracture with routine healing: Secondary | ICD-10-CM | POA: Diagnosis not present

## 2022-09-03 DIAGNOSIS — K219 Gastro-esophageal reflux disease without esophagitis: Secondary | ICD-10-CM | POA: Diagnosis not present

## 2022-09-03 DIAGNOSIS — I110 Hypertensive heart disease with heart failure: Secondary | ICD-10-CM | POA: Diagnosis not present

## 2022-09-03 DIAGNOSIS — G629 Polyneuropathy, unspecified: Secondary | ICD-10-CM | POA: Diagnosis not present

## 2022-09-03 DIAGNOSIS — I509 Heart failure, unspecified: Secondary | ICD-10-CM | POA: Diagnosis not present

## 2022-09-03 DIAGNOSIS — M109 Gout, unspecified: Secondary | ICD-10-CM | POA: Diagnosis not present

## 2022-09-03 DIAGNOSIS — E669 Obesity, unspecified: Secondary | ICD-10-CM | POA: Diagnosis not present

## 2022-09-03 DIAGNOSIS — I4891 Unspecified atrial fibrillation: Secondary | ICD-10-CM | POA: Diagnosis not present

## 2022-09-03 DIAGNOSIS — Z6841 Body Mass Index (BMI) 40.0 and over, adult: Secondary | ICD-10-CM | POA: Diagnosis not present

## 2022-09-03 DIAGNOSIS — Z7901 Long term (current) use of anticoagulants: Secondary | ICD-10-CM | POA: Diagnosis not present

## 2022-09-10 DIAGNOSIS — I4891 Unspecified atrial fibrillation: Secondary | ICD-10-CM | POA: Diagnosis not present

## 2022-09-10 DIAGNOSIS — K219 Gastro-esophageal reflux disease without esophagitis: Secondary | ICD-10-CM | POA: Diagnosis not present

## 2022-09-10 DIAGNOSIS — Z7901 Long term (current) use of anticoagulants: Secondary | ICD-10-CM | POA: Diagnosis not present

## 2022-09-10 DIAGNOSIS — I509 Heart failure, unspecified: Secondary | ICD-10-CM | POA: Diagnosis not present

## 2022-09-10 DIAGNOSIS — M80062D Age-related osteoporosis with current pathological fracture, left lower leg, subsequent encounter for fracture with routine healing: Secondary | ICD-10-CM | POA: Diagnosis not present

## 2022-09-10 DIAGNOSIS — Z6841 Body Mass Index (BMI) 40.0 and over, adult: Secondary | ICD-10-CM | POA: Diagnosis not present

## 2022-09-10 DIAGNOSIS — I110 Hypertensive heart disease with heart failure: Secondary | ICD-10-CM | POA: Diagnosis not present

## 2022-09-10 DIAGNOSIS — E785 Hyperlipidemia, unspecified: Secondary | ICD-10-CM | POA: Diagnosis not present

## 2022-09-10 DIAGNOSIS — E669 Obesity, unspecified: Secondary | ICD-10-CM | POA: Diagnosis not present

## 2022-09-10 DIAGNOSIS — M109 Gout, unspecified: Secondary | ICD-10-CM | POA: Diagnosis not present

## 2022-09-10 DIAGNOSIS — Z9181 History of falling: Secondary | ICD-10-CM | POA: Diagnosis not present

## 2022-09-10 DIAGNOSIS — G629 Polyneuropathy, unspecified: Secondary | ICD-10-CM | POA: Diagnosis not present

## 2022-09-12 DIAGNOSIS — I4891 Unspecified atrial fibrillation: Secondary | ICD-10-CM | POA: Diagnosis not present

## 2022-09-12 DIAGNOSIS — G629 Polyneuropathy, unspecified: Secondary | ICD-10-CM | POA: Diagnosis not present

## 2022-09-12 DIAGNOSIS — Z7901 Long term (current) use of anticoagulants: Secondary | ICD-10-CM | POA: Diagnosis not present

## 2022-09-12 DIAGNOSIS — I110 Hypertensive heart disease with heart failure: Secondary | ICD-10-CM | POA: Diagnosis not present

## 2022-09-12 DIAGNOSIS — Z6841 Body Mass Index (BMI) 40.0 and over, adult: Secondary | ICD-10-CM | POA: Diagnosis not present

## 2022-09-12 DIAGNOSIS — K219 Gastro-esophageal reflux disease without esophagitis: Secondary | ICD-10-CM | POA: Diagnosis not present

## 2022-09-12 DIAGNOSIS — E669 Obesity, unspecified: Secondary | ICD-10-CM | POA: Diagnosis not present

## 2022-09-12 DIAGNOSIS — M109 Gout, unspecified: Secondary | ICD-10-CM | POA: Diagnosis not present

## 2022-09-12 DIAGNOSIS — M80062D Age-related osteoporosis with current pathological fracture, left lower leg, subsequent encounter for fracture with routine healing: Secondary | ICD-10-CM | POA: Diagnosis not present

## 2022-09-12 DIAGNOSIS — E785 Hyperlipidemia, unspecified: Secondary | ICD-10-CM | POA: Diagnosis not present

## 2022-09-12 DIAGNOSIS — Z9181 History of falling: Secondary | ICD-10-CM | POA: Diagnosis not present

## 2022-09-12 DIAGNOSIS — I509 Heart failure, unspecified: Secondary | ICD-10-CM | POA: Diagnosis not present

## 2022-09-13 NOTE — Progress Notes (Deleted)
  Cardiology Office Note:  .   Date:  09/13/2022  ID:  Frederick Brown, DOB [birth year], MRN [id number] PCP: Assunta Found, MD  [facility] Providers Cardiologist:  Prentice Docker, MD (Inactive) { Click to update primary MD,subspecialty MD or APP then REFRESH:1}   History of Present Illness: .   Frederick Brown is a 72 y.o. male  with history of chronic afib , HTN, morbid obesity with panniculectomy in 2016 and OSA intolerant to CPAP Previous history of ETOH abuse He has been maintained on lopressor and xarelto  ROS: ***  Studies Reviewed: Marland Kitchen    EKG:  ***  *** Risk Assessment/Calculations:   {Does this patient have ATRIAL FIBRILLATION?[phone]} No BP recorded.  {Refresh Note OR Click here to enter BP  :1}***       Physical Exam:   VS:  There were no vitals taken for this visit.   Wt Readings from Last 3 Encounters:  05/23/22 (!) 366 lb (166 kg)  05/17/21 (!) 366 lb (166 kg)  01/15/19 (!) 366 lb (166 kg)    GEN: Well nourished, well developed in no acute distress NECK: No JVD; No carotid bruits CARDIAC: ***RRR, no murmurs, rubs, gallops RESPIRATORY:  Clear to auscultation without rales, wheezing or rhonchi  ABDOMEN: Soft, non-tender, non-distended EXTREMITIES:  No edema; No deformity   ASSESSMENT AND PLAN: .   Permanent atrial fibrillation on metoprolol and Xarelto  Chronic diastolic CHF normal LVEF on echo in 2018  Hypertension  Morbid obesity  Obstructed sleep apnea intolerant to CPAP    {Are you ordering a CV Procedure (e.g. stress test, cath, DCCV, TEE, etc)?   Press F2        :[id number]}  Dispo: ***  Signed, Jacolyn Reedy, PA-C

## 2022-09-18 DIAGNOSIS — Z6841 Body Mass Index (BMI) 40.0 and over, adult: Secondary | ICD-10-CM | POA: Diagnosis not present

## 2022-09-18 DIAGNOSIS — M80062D Age-related osteoporosis with current pathological fracture, left lower leg, subsequent encounter for fracture with routine healing: Secondary | ICD-10-CM | POA: Diagnosis not present

## 2022-09-18 DIAGNOSIS — I509 Heart failure, unspecified: Secondary | ICD-10-CM | POA: Diagnosis not present

## 2022-09-18 DIAGNOSIS — K219 Gastro-esophageal reflux disease without esophagitis: Secondary | ICD-10-CM | POA: Diagnosis not present

## 2022-09-18 DIAGNOSIS — E669 Obesity, unspecified: Secondary | ICD-10-CM | POA: Diagnosis not present

## 2022-09-18 DIAGNOSIS — E785 Hyperlipidemia, unspecified: Secondary | ICD-10-CM | POA: Diagnosis not present

## 2022-09-18 DIAGNOSIS — Z9181 History of falling: Secondary | ICD-10-CM | POA: Diagnosis not present

## 2022-09-18 DIAGNOSIS — Z7901 Long term (current) use of anticoagulants: Secondary | ICD-10-CM | POA: Diagnosis not present

## 2022-09-18 DIAGNOSIS — I110 Hypertensive heart disease with heart failure: Secondary | ICD-10-CM | POA: Diagnosis not present

## 2022-09-18 DIAGNOSIS — G629 Polyneuropathy, unspecified: Secondary | ICD-10-CM | POA: Diagnosis not present

## 2022-09-18 DIAGNOSIS — I4891 Unspecified atrial fibrillation: Secondary | ICD-10-CM | POA: Diagnosis not present

## 2022-09-18 DIAGNOSIS — M109 Gout, unspecified: Secondary | ICD-10-CM | POA: Diagnosis not present

## 2022-09-20 DIAGNOSIS — Z993 Dependence on wheelchair: Secondary | ICD-10-CM | POA: Diagnosis not present

## 2022-09-20 DIAGNOSIS — I5032 Chronic diastolic (congestive) heart failure: Secondary | ICD-10-CM | POA: Diagnosis not present

## 2022-09-20 DIAGNOSIS — M6281 Muscle weakness (generalized): Secondary | ICD-10-CM | POA: Diagnosis not present

## 2022-09-20 DIAGNOSIS — M80062D Age-related osteoporosis with current pathological fracture, left lower leg, subsequent encounter for fracture with routine healing: Secondary | ICD-10-CM | POA: Diagnosis not present

## 2022-09-22 DIAGNOSIS — K219 Gastro-esophageal reflux disease without esophagitis: Secondary | ICD-10-CM | POA: Diagnosis not present

## 2022-09-22 DIAGNOSIS — E669 Obesity, unspecified: Secondary | ICD-10-CM | POA: Diagnosis not present

## 2022-09-22 DIAGNOSIS — Z9181 History of falling: Secondary | ICD-10-CM | POA: Diagnosis not present

## 2022-09-22 DIAGNOSIS — I110 Hypertensive heart disease with heart failure: Secondary | ICD-10-CM | POA: Diagnosis not present

## 2022-09-22 DIAGNOSIS — I509 Heart failure, unspecified: Secondary | ICD-10-CM | POA: Diagnosis not present

## 2022-09-22 DIAGNOSIS — Z7901 Long term (current) use of anticoagulants: Secondary | ICD-10-CM | POA: Diagnosis not present

## 2022-09-22 DIAGNOSIS — Z6841 Body Mass Index (BMI) 40.0 and over, adult: Secondary | ICD-10-CM | POA: Diagnosis not present

## 2022-09-22 DIAGNOSIS — E785 Hyperlipidemia, unspecified: Secondary | ICD-10-CM | POA: Diagnosis not present

## 2022-09-22 DIAGNOSIS — G629 Polyneuropathy, unspecified: Secondary | ICD-10-CM | POA: Diagnosis not present

## 2022-09-22 DIAGNOSIS — M80062D Age-related osteoporosis with current pathological fracture, left lower leg, subsequent encounter for fracture with routine healing: Secondary | ICD-10-CM | POA: Diagnosis not present

## 2022-09-22 DIAGNOSIS — M109 Gout, unspecified: Secondary | ICD-10-CM | POA: Diagnosis not present

## 2022-09-22 DIAGNOSIS — I4891 Unspecified atrial fibrillation: Secondary | ICD-10-CM | POA: Diagnosis not present

## 2022-09-24 ENCOUNTER — Ambulatory Visit: Payer: Medicare Other | Admitting: Physician Assistant

## 2022-09-25 DIAGNOSIS — M6281 Muscle weakness (generalized): Secondary | ICD-10-CM | POA: Diagnosis not present

## 2022-09-25 DIAGNOSIS — I5032 Chronic diastolic (congestive) heart failure: Secondary | ICD-10-CM | POA: Diagnosis not present

## 2022-09-25 DIAGNOSIS — Z993 Dependence on wheelchair: Secondary | ICD-10-CM | POA: Diagnosis not present

## 2022-09-26 DIAGNOSIS — G629 Polyneuropathy, unspecified: Secondary | ICD-10-CM | POA: Diagnosis not present

## 2022-09-26 DIAGNOSIS — E785 Hyperlipidemia, unspecified: Secondary | ICD-10-CM | POA: Diagnosis not present

## 2022-09-26 DIAGNOSIS — Z9181 History of falling: Secondary | ICD-10-CM | POA: Diagnosis not present

## 2022-09-26 DIAGNOSIS — I509 Heart failure, unspecified: Secondary | ICD-10-CM | POA: Diagnosis not present

## 2022-09-26 DIAGNOSIS — I110 Hypertensive heart disease with heart failure: Secondary | ICD-10-CM | POA: Diagnosis not present

## 2022-09-26 DIAGNOSIS — M80062D Age-related osteoporosis with current pathological fracture, left lower leg, subsequent encounter for fracture with routine healing: Secondary | ICD-10-CM | POA: Diagnosis not present

## 2022-09-26 DIAGNOSIS — Z7901 Long term (current) use of anticoagulants: Secondary | ICD-10-CM | POA: Diagnosis not present

## 2022-09-26 DIAGNOSIS — M109 Gout, unspecified: Secondary | ICD-10-CM | POA: Diagnosis not present

## 2022-09-26 DIAGNOSIS — Z6841 Body Mass Index (BMI) 40.0 and over, adult: Secondary | ICD-10-CM | POA: Diagnosis not present

## 2022-09-26 DIAGNOSIS — K219 Gastro-esophageal reflux disease without esophagitis: Secondary | ICD-10-CM | POA: Diagnosis not present

## 2022-09-26 DIAGNOSIS — E669 Obesity, unspecified: Secondary | ICD-10-CM | POA: Diagnosis not present

## 2022-09-26 DIAGNOSIS — I4891 Unspecified atrial fibrillation: Secondary | ICD-10-CM | POA: Diagnosis not present

## 2022-09-30 ENCOUNTER — Other Ambulatory Visit: Payer: Self-pay | Admitting: Cardiovascular Disease

## 2022-09-30 NOTE — Telephone Encounter (Signed)
Prescription refill request for Xarelto received.  Indication:afib Last office visit:needs appt Weight:166 kg Age:72 Scr:1.35 2/24 CrCl:117.84  ml/min  Prescription refilled

## 2022-10-02 DIAGNOSIS — M80062D Age-related osteoporosis with current pathological fracture, left lower leg, subsequent encounter for fracture with routine healing: Secondary | ICD-10-CM | POA: Diagnosis not present

## 2022-10-02 DIAGNOSIS — M109 Gout, unspecified: Secondary | ICD-10-CM | POA: Diagnosis not present

## 2022-10-02 DIAGNOSIS — Z9181 History of falling: Secondary | ICD-10-CM | POA: Diagnosis not present

## 2022-10-02 DIAGNOSIS — I110 Hypertensive heart disease with heart failure: Secondary | ICD-10-CM | POA: Diagnosis not present

## 2022-10-02 DIAGNOSIS — I509 Heart failure, unspecified: Secondary | ICD-10-CM | POA: Diagnosis not present

## 2022-10-02 DIAGNOSIS — G629 Polyneuropathy, unspecified: Secondary | ICD-10-CM | POA: Diagnosis not present

## 2022-10-02 DIAGNOSIS — I4891 Unspecified atrial fibrillation: Secondary | ICD-10-CM | POA: Diagnosis not present

## 2022-10-02 DIAGNOSIS — E785 Hyperlipidemia, unspecified: Secondary | ICD-10-CM | POA: Diagnosis not present

## 2022-10-02 DIAGNOSIS — K219 Gastro-esophageal reflux disease without esophagitis: Secondary | ICD-10-CM | POA: Diagnosis not present

## 2022-10-02 DIAGNOSIS — Z7901 Long term (current) use of anticoagulants: Secondary | ICD-10-CM | POA: Diagnosis not present

## 2022-10-02 DIAGNOSIS — Z6841 Body Mass Index (BMI) 40.0 and over, adult: Secondary | ICD-10-CM | POA: Diagnosis not present

## 2022-10-02 DIAGNOSIS — E669 Obesity, unspecified: Secondary | ICD-10-CM | POA: Diagnosis not present

## 2022-10-07 DIAGNOSIS — I509 Heart failure, unspecified: Secondary | ICD-10-CM | POA: Diagnosis not present

## 2022-10-08 DIAGNOSIS — Z9181 History of falling: Secondary | ICD-10-CM | POA: Diagnosis not present

## 2022-10-08 DIAGNOSIS — I110 Hypertensive heart disease with heart failure: Secondary | ICD-10-CM | POA: Diagnosis not present

## 2022-10-08 DIAGNOSIS — M80062D Age-related osteoporosis with current pathological fracture, left lower leg, subsequent encounter for fracture with routine healing: Secondary | ICD-10-CM | POA: Diagnosis not present

## 2022-10-08 DIAGNOSIS — Z6841 Body Mass Index (BMI) 40.0 and over, adult: Secondary | ICD-10-CM | POA: Diagnosis not present

## 2022-10-08 DIAGNOSIS — E785 Hyperlipidemia, unspecified: Secondary | ICD-10-CM | POA: Diagnosis not present

## 2022-10-08 DIAGNOSIS — I509 Heart failure, unspecified: Secondary | ICD-10-CM | POA: Diagnosis not present

## 2022-10-08 DIAGNOSIS — G629 Polyneuropathy, unspecified: Secondary | ICD-10-CM | POA: Diagnosis not present

## 2022-10-08 DIAGNOSIS — E669 Obesity, unspecified: Secondary | ICD-10-CM | POA: Diagnosis not present

## 2022-10-08 DIAGNOSIS — M109 Gout, unspecified: Secondary | ICD-10-CM | POA: Diagnosis not present

## 2022-10-08 DIAGNOSIS — I4891 Unspecified atrial fibrillation: Secondary | ICD-10-CM | POA: Diagnosis not present

## 2022-10-08 DIAGNOSIS — K219 Gastro-esophageal reflux disease without esophagitis: Secondary | ICD-10-CM | POA: Diagnosis not present

## 2022-10-08 DIAGNOSIS — Z7901 Long term (current) use of anticoagulants: Secondary | ICD-10-CM | POA: Diagnosis not present

## 2022-10-17 DIAGNOSIS — Z6841 Body Mass Index (BMI) 40.0 and over, adult: Secondary | ICD-10-CM | POA: Diagnosis not present

## 2022-10-17 DIAGNOSIS — I509 Heart failure, unspecified: Secondary | ICD-10-CM | POA: Diagnosis not present

## 2022-10-17 DIAGNOSIS — M109 Gout, unspecified: Secondary | ICD-10-CM | POA: Diagnosis not present

## 2022-10-17 DIAGNOSIS — E785 Hyperlipidemia, unspecified: Secondary | ICD-10-CM | POA: Diagnosis not present

## 2022-10-17 DIAGNOSIS — G629 Polyneuropathy, unspecified: Secondary | ICD-10-CM | POA: Diagnosis not present

## 2022-10-17 DIAGNOSIS — I4891 Unspecified atrial fibrillation: Secondary | ICD-10-CM | POA: Diagnosis not present

## 2022-10-17 DIAGNOSIS — Z7901 Long term (current) use of anticoagulants: Secondary | ICD-10-CM | POA: Diagnosis not present

## 2022-10-17 DIAGNOSIS — E669 Obesity, unspecified: Secondary | ICD-10-CM | POA: Diagnosis not present

## 2022-10-17 DIAGNOSIS — K219 Gastro-esophageal reflux disease without esophagitis: Secondary | ICD-10-CM | POA: Diagnosis not present

## 2022-10-17 DIAGNOSIS — Z9181 History of falling: Secondary | ICD-10-CM | POA: Diagnosis not present

## 2022-10-17 DIAGNOSIS — M80062D Age-related osteoporosis with current pathological fracture, left lower leg, subsequent encounter for fracture with routine healing: Secondary | ICD-10-CM | POA: Diagnosis not present

## 2022-10-17 DIAGNOSIS — I110 Hypertensive heart disease with heart failure: Secondary | ICD-10-CM | POA: Diagnosis not present

## 2022-10-20 DIAGNOSIS — Z993 Dependence on wheelchair: Secondary | ICD-10-CM | POA: Diagnosis not present

## 2022-10-20 DIAGNOSIS — I5032 Chronic diastolic (congestive) heart failure: Secondary | ICD-10-CM | POA: Diagnosis not present

## 2022-10-20 DIAGNOSIS — M80062D Age-related osteoporosis with current pathological fracture, left lower leg, subsequent encounter for fracture with routine healing: Secondary | ICD-10-CM | POA: Diagnosis not present

## 2022-10-20 DIAGNOSIS — M6281 Muscle weakness (generalized): Secondary | ICD-10-CM | POA: Diagnosis not present

## 2022-10-22 DIAGNOSIS — Z9181 History of falling: Secondary | ICD-10-CM | POA: Diagnosis not present

## 2022-10-22 DIAGNOSIS — Z6841 Body Mass Index (BMI) 40.0 and over, adult: Secondary | ICD-10-CM | POA: Diagnosis not present

## 2022-10-22 DIAGNOSIS — I4891 Unspecified atrial fibrillation: Secondary | ICD-10-CM | POA: Diagnosis not present

## 2022-10-22 DIAGNOSIS — I110 Hypertensive heart disease with heart failure: Secondary | ICD-10-CM | POA: Diagnosis not present

## 2022-10-22 DIAGNOSIS — G629 Polyneuropathy, unspecified: Secondary | ICD-10-CM | POA: Diagnosis not present

## 2022-10-22 DIAGNOSIS — Z7901 Long term (current) use of anticoagulants: Secondary | ICD-10-CM | POA: Diagnosis not present

## 2022-10-22 DIAGNOSIS — E785 Hyperlipidemia, unspecified: Secondary | ICD-10-CM | POA: Diagnosis not present

## 2022-10-22 DIAGNOSIS — I509 Heart failure, unspecified: Secondary | ICD-10-CM | POA: Diagnosis not present

## 2022-10-22 DIAGNOSIS — M109 Gout, unspecified: Secondary | ICD-10-CM | POA: Diagnosis not present

## 2022-10-22 DIAGNOSIS — K219 Gastro-esophageal reflux disease without esophagitis: Secondary | ICD-10-CM | POA: Diagnosis not present

## 2022-10-22 DIAGNOSIS — M80062D Age-related osteoporosis with current pathological fracture, left lower leg, subsequent encounter for fracture with routine healing: Secondary | ICD-10-CM | POA: Diagnosis not present

## 2022-10-22 DIAGNOSIS — E669 Obesity, unspecified: Secondary | ICD-10-CM | POA: Diagnosis not present

## 2022-10-23 DIAGNOSIS — I509 Heart failure, unspecified: Secondary | ICD-10-CM | POA: Diagnosis not present

## 2022-10-23 DIAGNOSIS — M80062D Age-related osteoporosis with current pathological fracture, left lower leg, subsequent encounter for fracture with routine healing: Secondary | ICD-10-CM | POA: Diagnosis not present

## 2022-10-23 DIAGNOSIS — I110 Hypertensive heart disease with heart failure: Secondary | ICD-10-CM | POA: Diagnosis not present

## 2022-10-23 DIAGNOSIS — I4891 Unspecified atrial fibrillation: Secondary | ICD-10-CM | POA: Diagnosis not present

## 2022-10-25 DIAGNOSIS — I5032 Chronic diastolic (congestive) heart failure: Secondary | ICD-10-CM | POA: Diagnosis not present

## 2022-10-25 DIAGNOSIS — M109 Gout, unspecified: Secondary | ICD-10-CM | POA: Diagnosis not present

## 2022-10-25 DIAGNOSIS — E785 Hyperlipidemia, unspecified: Secondary | ICD-10-CM | POA: Diagnosis not present

## 2022-10-25 DIAGNOSIS — I110 Hypertensive heart disease with heart failure: Secondary | ICD-10-CM | POA: Diagnosis not present

## 2022-10-25 DIAGNOSIS — I4891 Unspecified atrial fibrillation: Secondary | ICD-10-CM | POA: Diagnosis not present

## 2022-10-25 DIAGNOSIS — M6281 Muscle weakness (generalized): Secondary | ICD-10-CM | POA: Diagnosis not present

## 2022-10-25 DIAGNOSIS — G629 Polyneuropathy, unspecified: Secondary | ICD-10-CM | POA: Diagnosis not present

## 2022-10-25 DIAGNOSIS — K219 Gastro-esophageal reflux disease without esophagitis: Secondary | ICD-10-CM | POA: Diagnosis not present

## 2022-10-25 DIAGNOSIS — M80062D Age-related osteoporosis with current pathological fracture, left lower leg, subsequent encounter for fracture with routine healing: Secondary | ICD-10-CM | POA: Diagnosis not present

## 2022-10-25 DIAGNOSIS — Z7901 Long term (current) use of anticoagulants: Secondary | ICD-10-CM | POA: Diagnosis not present

## 2022-10-25 DIAGNOSIS — Z993 Dependence on wheelchair: Secondary | ICD-10-CM | POA: Diagnosis not present

## 2022-10-25 DIAGNOSIS — I509 Heart failure, unspecified: Secondary | ICD-10-CM | POA: Diagnosis not present

## 2022-10-25 DIAGNOSIS — Z9181 History of falling: Secondary | ICD-10-CM | POA: Diagnosis not present

## 2022-10-25 DIAGNOSIS — E669 Obesity, unspecified: Secondary | ICD-10-CM | POA: Diagnosis not present

## 2022-10-25 DIAGNOSIS — Z6841 Body Mass Index (BMI) 40.0 and over, adult: Secondary | ICD-10-CM | POA: Diagnosis not present

## 2022-11-01 DIAGNOSIS — E785 Hyperlipidemia, unspecified: Secondary | ICD-10-CM | POA: Diagnosis not present

## 2022-11-01 DIAGNOSIS — I509 Heart failure, unspecified: Secondary | ICD-10-CM | POA: Diagnosis not present

## 2022-11-01 DIAGNOSIS — G629 Polyneuropathy, unspecified: Secondary | ICD-10-CM | POA: Diagnosis not present

## 2022-11-01 DIAGNOSIS — I4891 Unspecified atrial fibrillation: Secondary | ICD-10-CM | POA: Diagnosis not present

## 2022-11-01 DIAGNOSIS — M109 Gout, unspecified: Secondary | ICD-10-CM | POA: Diagnosis not present

## 2022-11-01 DIAGNOSIS — Z9181 History of falling: Secondary | ICD-10-CM | POA: Diagnosis not present

## 2022-11-01 DIAGNOSIS — Z7901 Long term (current) use of anticoagulants: Secondary | ICD-10-CM | POA: Diagnosis not present

## 2022-11-01 DIAGNOSIS — K219 Gastro-esophageal reflux disease without esophagitis: Secondary | ICD-10-CM | POA: Diagnosis not present

## 2022-11-01 DIAGNOSIS — E669 Obesity, unspecified: Secondary | ICD-10-CM | POA: Diagnosis not present

## 2022-11-01 DIAGNOSIS — Z6841 Body Mass Index (BMI) 40.0 and over, adult: Secondary | ICD-10-CM | POA: Diagnosis not present

## 2022-11-01 DIAGNOSIS — I110 Hypertensive heart disease with heart failure: Secondary | ICD-10-CM | POA: Diagnosis not present

## 2022-11-01 DIAGNOSIS — M80062D Age-related osteoporosis with current pathological fracture, left lower leg, subsequent encounter for fracture with routine healing: Secondary | ICD-10-CM | POA: Diagnosis not present

## 2022-11-04 ENCOUNTER — Telehealth: Payer: Self-pay | Admitting: Cardiovascular Disease

## 2022-11-04 ENCOUNTER — Other Ambulatory Visit: Payer: Self-pay | Admitting: Cardiovascular Disease

## 2022-11-04 NOTE — Telephone Encounter (Signed)
Prescription refill request for Xarelto received.  Indication: AF Last office visit: 05/17/21  Burna Forts MD Appt 02/10/23 Weight: 166kg Age: 72 Scr: 1.35 on 05/23/22  Epic CrCl: 117.84  Based on above findings Xarelto 20mg  daily is the appropriate dose.  Refill approved.

## 2022-11-04 NOTE — Telephone Encounter (Signed)
*  STAT* If patient is at the pharmacy, call can be transferred to refill team.   1. Which medications need to be refilled? (please list name of each medication and dose if known)   rivaroxaban (XARELTO) 20 MG TABS tablet    2. Which pharmacy/location (including street and city if local pharmacy) is medication to be sent to?  [facility] [phone] - [place], [place] - [address] AT [facility] [address]      3. Do they need a 30 day or 90 day supply? 90 day    Pt is out of medication and has an office visit scheduled for 02/10/2023.

## 2022-11-05 MED ORDER — ROSUVASTATIN CALCIUM 20 MG PO TABS: 20.0000 mg | ORAL_TABLET | Freq: Every day | ORAL | 0 refills | Status: AC

## 2022-11-05 NOTE — Telephone Encounter (Signed)
Prescription refill request for Xarelto received.  Indication: a fib Last office visit: 05/17/21 has appt scheduled Weight: 366# Age: 72 Scr: 1.35 epic 05/23/22 CrCl:  118 ml/min

## 2022-11-08 DIAGNOSIS — E669 Obesity, unspecified: Secondary | ICD-10-CM | POA: Diagnosis not present

## 2022-11-08 DIAGNOSIS — Z9181 History of falling: Secondary | ICD-10-CM | POA: Diagnosis not present

## 2022-11-08 DIAGNOSIS — E785 Hyperlipidemia, unspecified: Secondary | ICD-10-CM | POA: Diagnosis not present

## 2022-11-08 DIAGNOSIS — M80062D Age-related osteoporosis with current pathological fracture, left lower leg, subsequent encounter for fracture with routine healing: Secondary | ICD-10-CM | POA: Diagnosis not present

## 2022-11-08 DIAGNOSIS — M109 Gout, unspecified: Secondary | ICD-10-CM | POA: Diagnosis not present

## 2022-11-08 DIAGNOSIS — G629 Polyneuropathy, unspecified: Secondary | ICD-10-CM | POA: Diagnosis not present

## 2022-11-08 DIAGNOSIS — Z6841 Body Mass Index (BMI) 40.0 and over, adult: Secondary | ICD-10-CM | POA: Diagnosis not present

## 2022-11-08 DIAGNOSIS — I4891 Unspecified atrial fibrillation: Secondary | ICD-10-CM | POA: Diagnosis not present

## 2022-11-08 DIAGNOSIS — Z7901 Long term (current) use of anticoagulants: Secondary | ICD-10-CM | POA: Diagnosis not present

## 2022-11-08 DIAGNOSIS — K219 Gastro-esophageal reflux disease without esophagitis: Secondary | ICD-10-CM | POA: Diagnosis not present

## 2022-11-08 DIAGNOSIS — I509 Heart failure, unspecified: Secondary | ICD-10-CM | POA: Diagnosis not present

## 2022-11-08 DIAGNOSIS — I110 Hypertensive heart disease with heart failure: Secondary | ICD-10-CM | POA: Diagnosis not present

## 2022-11-13 DIAGNOSIS — Z6841 Body Mass Index (BMI) 40.0 and over, adult: Secondary | ICD-10-CM | POA: Diagnosis not present

## 2022-11-13 DIAGNOSIS — G629 Polyneuropathy, unspecified: Secondary | ICD-10-CM | POA: Diagnosis not present

## 2022-11-13 DIAGNOSIS — K219 Gastro-esophageal reflux disease without esophagitis: Secondary | ICD-10-CM | POA: Diagnosis not present

## 2022-11-13 DIAGNOSIS — M80062D Age-related osteoporosis with current pathological fracture, left lower leg, subsequent encounter for fracture with routine healing: Secondary | ICD-10-CM | POA: Diagnosis not present

## 2022-11-13 DIAGNOSIS — I110 Hypertensive heart disease with heart failure: Secondary | ICD-10-CM | POA: Diagnosis not present

## 2022-11-13 DIAGNOSIS — Z9181 History of falling: Secondary | ICD-10-CM | POA: Diagnosis not present

## 2022-11-13 DIAGNOSIS — I4891 Unspecified atrial fibrillation: Secondary | ICD-10-CM | POA: Diagnosis not present

## 2022-11-13 DIAGNOSIS — Z7901 Long term (current) use of anticoagulants: Secondary | ICD-10-CM | POA: Diagnosis not present

## 2022-11-13 DIAGNOSIS — I509 Heart failure, unspecified: Secondary | ICD-10-CM | POA: Diagnosis not present

## 2022-11-13 DIAGNOSIS — E785 Hyperlipidemia, unspecified: Secondary | ICD-10-CM | POA: Diagnosis not present

## 2022-11-13 DIAGNOSIS — E669 Obesity, unspecified: Secondary | ICD-10-CM | POA: Diagnosis not present

## 2022-11-13 DIAGNOSIS — M109 Gout, unspecified: Secondary | ICD-10-CM | POA: Diagnosis not present

## 2022-11-20 DIAGNOSIS — I5032 Chronic diastolic (congestive) heart failure: Secondary | ICD-10-CM | POA: Diagnosis not present

## 2022-11-20 DIAGNOSIS — M80062D Age-related osteoporosis with current pathological fracture, left lower leg, subsequent encounter for fracture with routine healing: Secondary | ICD-10-CM | POA: Diagnosis not present

## 2022-11-20 DIAGNOSIS — M6281 Muscle weakness (generalized): Secondary | ICD-10-CM | POA: Diagnosis not present

## 2022-11-20 DIAGNOSIS — Z993 Dependence on wheelchair: Secondary | ICD-10-CM | POA: Diagnosis not present

## 2022-11-21 DIAGNOSIS — I4891 Unspecified atrial fibrillation: Secondary | ICD-10-CM | POA: Diagnosis not present

## 2022-11-21 DIAGNOSIS — Z9181 History of falling: Secondary | ICD-10-CM | POA: Diagnosis not present

## 2022-11-21 DIAGNOSIS — M80062D Age-related osteoporosis with current pathological fracture, left lower leg, subsequent encounter for fracture with routine healing: Secondary | ICD-10-CM | POA: Diagnosis not present

## 2022-11-21 DIAGNOSIS — G629 Polyneuropathy, unspecified: Secondary | ICD-10-CM | POA: Diagnosis not present

## 2022-11-21 DIAGNOSIS — Z7901 Long term (current) use of anticoagulants: Secondary | ICD-10-CM | POA: Diagnosis not present

## 2022-11-21 DIAGNOSIS — I110 Hypertensive heart disease with heart failure: Secondary | ICD-10-CM | POA: Diagnosis not present

## 2022-11-21 DIAGNOSIS — K219 Gastro-esophageal reflux disease without esophagitis: Secondary | ICD-10-CM | POA: Diagnosis not present

## 2022-11-21 DIAGNOSIS — E669 Obesity, unspecified: Secondary | ICD-10-CM | POA: Diagnosis not present

## 2022-11-21 DIAGNOSIS — Z6841 Body Mass Index (BMI) 40.0 and over, adult: Secondary | ICD-10-CM | POA: Diagnosis not present

## 2022-11-21 DIAGNOSIS — E785 Hyperlipidemia, unspecified: Secondary | ICD-10-CM | POA: Diagnosis not present

## 2022-11-21 DIAGNOSIS — M109 Gout, unspecified: Secondary | ICD-10-CM | POA: Diagnosis not present

## 2022-11-21 DIAGNOSIS — I509 Heart failure, unspecified: Secondary | ICD-10-CM | POA: Diagnosis not present

## 2022-11-22 DIAGNOSIS — K219 Gastro-esophageal reflux disease without esophagitis: Secondary | ICD-10-CM | POA: Diagnosis not present

## 2022-11-22 DIAGNOSIS — M80062D Age-related osteoporosis with current pathological fracture, left lower leg, subsequent encounter for fracture with routine healing: Secondary | ICD-10-CM | POA: Diagnosis not present

## 2022-11-22 DIAGNOSIS — I110 Hypertensive heart disease with heart failure: Secondary | ICD-10-CM | POA: Diagnosis not present

## 2022-11-22 DIAGNOSIS — G629 Polyneuropathy, unspecified: Secondary | ICD-10-CM | POA: Diagnosis not present

## 2022-11-22 DIAGNOSIS — I509 Heart failure, unspecified: Secondary | ICD-10-CM | POA: Diagnosis not present

## 2022-11-22 DIAGNOSIS — I4891 Unspecified atrial fibrillation: Secondary | ICD-10-CM | POA: Diagnosis not present

## 2022-11-22 DIAGNOSIS — Z9181 History of falling: Secondary | ICD-10-CM | POA: Diagnosis not present

## 2022-11-22 DIAGNOSIS — M109 Gout, unspecified: Secondary | ICD-10-CM | POA: Diagnosis not present

## 2022-11-22 DIAGNOSIS — Z6841 Body Mass Index (BMI) 40.0 and over, adult: Secondary | ICD-10-CM | POA: Diagnosis not present

## 2022-11-22 DIAGNOSIS — E785 Hyperlipidemia, unspecified: Secondary | ICD-10-CM | POA: Diagnosis not present

## 2022-11-22 DIAGNOSIS — E669 Obesity, unspecified: Secondary | ICD-10-CM | POA: Diagnosis not present

## 2022-11-22 DIAGNOSIS — Z7901 Long term (current) use of anticoagulants: Secondary | ICD-10-CM | POA: Diagnosis not present

## 2022-11-25 DIAGNOSIS — I5032 Chronic diastolic (congestive) heart failure: Secondary | ICD-10-CM | POA: Diagnosis not present

## 2022-11-25 DIAGNOSIS — M6281 Muscle weakness (generalized): Secondary | ICD-10-CM | POA: Diagnosis not present

## 2022-11-25 DIAGNOSIS — Z993 Dependence on wheelchair: Secondary | ICD-10-CM | POA: Diagnosis not present

## 2022-11-27 DIAGNOSIS — E669 Obesity, unspecified: Secondary | ICD-10-CM | POA: Diagnosis not present

## 2022-11-27 DIAGNOSIS — G629 Polyneuropathy, unspecified: Secondary | ICD-10-CM | POA: Diagnosis not present

## 2022-11-27 DIAGNOSIS — I110 Hypertensive heart disease with heart failure: Secondary | ICD-10-CM | POA: Diagnosis not present

## 2022-11-27 DIAGNOSIS — M80062D Age-related osteoporosis with current pathological fracture, left lower leg, subsequent encounter for fracture with routine healing: Secondary | ICD-10-CM | POA: Diagnosis not present

## 2022-11-27 DIAGNOSIS — M109 Gout, unspecified: Secondary | ICD-10-CM | POA: Diagnosis not present

## 2022-11-27 DIAGNOSIS — I509 Heart failure, unspecified: Secondary | ICD-10-CM | POA: Diagnosis not present

## 2022-11-27 DIAGNOSIS — E785 Hyperlipidemia, unspecified: Secondary | ICD-10-CM | POA: Diagnosis not present

## 2022-11-27 DIAGNOSIS — Z7901 Long term (current) use of anticoagulants: Secondary | ICD-10-CM | POA: Diagnosis not present

## 2022-11-27 DIAGNOSIS — Z6841 Body Mass Index (BMI) 40.0 and over, adult: Secondary | ICD-10-CM | POA: Diagnosis not present

## 2022-11-27 DIAGNOSIS — Z9181 History of falling: Secondary | ICD-10-CM | POA: Diagnosis not present

## 2022-11-27 DIAGNOSIS — I4891 Unspecified atrial fibrillation: Secondary | ICD-10-CM | POA: Diagnosis not present

## 2022-11-27 DIAGNOSIS — K219 Gastro-esophageal reflux disease without esophagitis: Secondary | ICD-10-CM | POA: Diagnosis not present

## 2022-12-05 DIAGNOSIS — K219 Gastro-esophageal reflux disease without esophagitis: Secondary | ICD-10-CM | POA: Diagnosis not present

## 2022-12-05 DIAGNOSIS — E669 Obesity, unspecified: Secondary | ICD-10-CM | POA: Diagnosis not present

## 2022-12-05 DIAGNOSIS — Z7901 Long term (current) use of anticoagulants: Secondary | ICD-10-CM | POA: Diagnosis not present

## 2022-12-05 DIAGNOSIS — I110 Hypertensive heart disease with heart failure: Secondary | ICD-10-CM | POA: Diagnosis not present

## 2022-12-05 DIAGNOSIS — M109 Gout, unspecified: Secondary | ICD-10-CM | POA: Diagnosis not present

## 2022-12-05 DIAGNOSIS — Z6841 Body Mass Index (BMI) 40.0 and over, adult: Secondary | ICD-10-CM | POA: Diagnosis not present

## 2022-12-05 DIAGNOSIS — Z9181 History of falling: Secondary | ICD-10-CM | POA: Diagnosis not present

## 2022-12-05 DIAGNOSIS — I509 Heart failure, unspecified: Secondary | ICD-10-CM | POA: Diagnosis not present

## 2022-12-05 DIAGNOSIS — M80062D Age-related osteoporosis with current pathological fracture, left lower leg, subsequent encounter for fracture with routine healing: Secondary | ICD-10-CM | POA: Diagnosis not present

## 2022-12-05 DIAGNOSIS — E785 Hyperlipidemia, unspecified: Secondary | ICD-10-CM | POA: Diagnosis not present

## 2022-12-05 DIAGNOSIS — G629 Polyneuropathy, unspecified: Secondary | ICD-10-CM | POA: Diagnosis not present

## 2022-12-05 DIAGNOSIS — I4891 Unspecified atrial fibrillation: Secondary | ICD-10-CM | POA: Diagnosis not present

## 2022-12-18 DIAGNOSIS — E669 Obesity, unspecified: Secondary | ICD-10-CM | POA: Diagnosis not present

## 2022-12-18 DIAGNOSIS — Z7901 Long term (current) use of anticoagulants: Secondary | ICD-10-CM | POA: Diagnosis not present

## 2022-12-18 DIAGNOSIS — E785 Hyperlipidemia, unspecified: Secondary | ICD-10-CM | POA: Diagnosis not present

## 2022-12-18 DIAGNOSIS — Z9181 History of falling: Secondary | ICD-10-CM | POA: Diagnosis not present

## 2022-12-18 DIAGNOSIS — I4891 Unspecified atrial fibrillation: Secondary | ICD-10-CM | POA: Diagnosis not present

## 2022-12-18 DIAGNOSIS — Z6841 Body Mass Index (BMI) 40.0 and over, adult: Secondary | ICD-10-CM | POA: Diagnosis not present

## 2022-12-18 DIAGNOSIS — G629 Polyneuropathy, unspecified: Secondary | ICD-10-CM | POA: Diagnosis not present

## 2022-12-18 DIAGNOSIS — K219 Gastro-esophageal reflux disease without esophagitis: Secondary | ICD-10-CM | POA: Diagnosis not present

## 2022-12-18 DIAGNOSIS — I110 Hypertensive heart disease with heart failure: Secondary | ICD-10-CM | POA: Diagnosis not present

## 2022-12-18 DIAGNOSIS — M109 Gout, unspecified: Secondary | ICD-10-CM | POA: Diagnosis not present

## 2022-12-18 DIAGNOSIS — M80062D Age-related osteoporosis with current pathological fracture, left lower leg, subsequent encounter for fracture with routine healing: Secondary | ICD-10-CM | POA: Diagnosis not present

## 2022-12-18 DIAGNOSIS — I509 Heart failure, unspecified: Secondary | ICD-10-CM | POA: Diagnosis not present

## 2022-12-21 DIAGNOSIS — Z993 Dependence on wheelchair: Secondary | ICD-10-CM | POA: Diagnosis not present

## 2022-12-21 DIAGNOSIS — I5032 Chronic diastolic (congestive) heart failure: Secondary | ICD-10-CM | POA: Diagnosis not present

## 2022-12-21 DIAGNOSIS — M80062D Age-related osteoporosis with current pathological fracture, left lower leg, subsequent encounter for fracture with routine healing: Secondary | ICD-10-CM | POA: Diagnosis not present

## 2022-12-21 DIAGNOSIS — M6281 Muscle weakness (generalized): Secondary | ICD-10-CM | POA: Diagnosis not present

## 2022-12-27 DIAGNOSIS — I5032 Chronic diastolic (congestive) heart failure: Secondary | ICD-10-CM | POA: Diagnosis not present

## 2022-12-27 DIAGNOSIS — M6281 Muscle weakness (generalized): Secondary | ICD-10-CM | POA: Diagnosis not present

## 2022-12-27 DIAGNOSIS — Z993 Dependence on wheelchair: Secondary | ICD-10-CM | POA: Diagnosis not present

## 2023-01-20 DIAGNOSIS — M80062D Age-related osteoporosis with current pathological fracture, left lower leg, subsequent encounter for fracture with routine healing: Secondary | ICD-10-CM | POA: Diagnosis not present

## 2023-01-20 DIAGNOSIS — I5032 Chronic diastolic (congestive) heart failure: Secondary | ICD-10-CM | POA: Diagnosis not present

## 2023-01-20 DIAGNOSIS — M6281 Muscle weakness (generalized): Secondary | ICD-10-CM | POA: Diagnosis not present

## 2023-01-20 DIAGNOSIS — Z993 Dependence on wheelchair: Secondary | ICD-10-CM | POA: Diagnosis not present

## 2023-01-22 ENCOUNTER — Other Ambulatory Visit: Payer: Self-pay | Admitting: Cardiovascular Disease

## 2023-01-22 NOTE — Telephone Encounter (Signed)
Xarelto 20mg  refill request received. Pt is 72 years old, weight-166kg, Crea-1.35 on 05/23/22, last seen by Dr. Eden Emms on 05/17/21 and pending appt on 02/10/23, Diagnosis-Afib, CrCl-117.84 mL/min; Dose is appropriate based on dosing criteria. Will send in refill to requested pharmacy.

## 2023-01-26 DIAGNOSIS — I5032 Chronic diastolic (congestive) heart failure: Secondary | ICD-10-CM | POA: Diagnosis not present

## 2023-01-26 DIAGNOSIS — M6281 Muscle weakness (generalized): Secondary | ICD-10-CM | POA: Diagnosis not present

## 2023-01-26 DIAGNOSIS — Z993 Dependence on wheelchair: Secondary | ICD-10-CM | POA: Diagnosis not present

## 2023-01-26 DIAGNOSIS — M80062D Age-related osteoporosis with current pathological fracture, left lower leg, subsequent encounter for fracture with routine healing: Secondary | ICD-10-CM | POA: Diagnosis not present

## 2023-02-04 NOTE — Progress Notes (Deleted)
CARDIOLOGY CONSULT NOTE      Patient ID: Frederick Brown MRN: [id number] DOB/AGE: March 09, 1951 72 y.o.  Referring Physician: Phillips Odor Primary Physician: Assunta Found, MD Primary Cardiologist: Eden Emms Reason for Consultation: Anticoagulation   HPI:  72 y.o. referred by Dr Phillips Odor Needs cardiology visit to prescribe further xarelto.First seen by me 05/17/21 after being followed by Dr Purvis Sheffield  He has chronic afib , HTN, morbid obesity with panniculectomy in 2016 and OSA intolerant to CPAP Previous history of ETOH abuse He has been maintained on lopressor and xarelto No bleeding issues He takes lasix for his LE edema  Labs with primary 04/11/21 reviewed Normal CBC/PLT and BMET normal TSH LDL 133   He admits to 2-3 vodka/OJ drinks per day Discussed long term risks of this Discussed seeing if Eliquis is cheaper for him than xarelto  Still very sedentary No bleeding issues   ***  ROS All other systems reviewed and negative except as noted above  Past Medical History:  Diagnosis Date   A-fib (HCC)    Alcohol abuse    CHF (congestive heart failure) (HCC)    Chronic respiratory failure with hypoxia (HCC) 05/28/2011   Cor pulmonale, chronic (HCC) February 2013   Degenerative joint disease    Fracture of great toe, left, closed 05/28/2011   Fracture of metatarsal of left foot, closed 05/28/2011   Gastroesophageal reflux disease    Hypertension    Obesity hypoventilation syndrome (HCC) February 2013   Obesity, morbid (more than 100 lbs over ideal weight or BMI > 40) (HCC)    Pickwickian syndrome (HCC)    Umbilical hernia    Urticarial rash 05/31/2011   From Cipro   Vitamin B12 deficiency 05/22/2011    Family History  Problem Relation Age of Onset   Heart attack Father    Heart failure Father    Stroke Father    Hypertension Father     Social History   Socioeconomic History   Marital status: Divorced    Spouse name: Not on file   Number of children: Not on file   Years of  education: Not on file   Highest education level: Not on file  Occupational History   Not on file  Tobacco Use   Smoking status: Former    Current packs/day: 0.00    Types: Cigarettes    Quit date: 04/02/1981    Years since quitting: 41.8   Smokeless tobacco: Never  Vaping Use   Vaping status: Never Used  Substance and Sexual Activity   Alcohol use: Yes    Alcohol/week: 1.0 standard drink of alcohol    Types: 1 Shots of liquor per week    Comment: 2-3 drinks daily   Drug use: No   Sexual activity: Not Currently  Other Topics Concern   Not on file  Social History Narrative   Not on file   Social Determinants of Health   Financial Resource Strain: Not on file  Food Insecurity: Not on file  Transportation Needs: Not on file  Physical Activity: Not on file  Stress: Not on file  Social Connections: Not on file  Intimate Partner Violence: Not on file    Past Surgical History:  Procedure Laterality Date   CHOLECYSTECTOMY     CYSTOSCOPY     CYSTOSCOPY W/ URETERAL STENT PLACEMENT Bilateral 02/22/2015   Procedure: CYSTOSCOPY WITH BILATERAL STENT EXCHANGE;  Surgeon: Malen Gauze, MD;  Location: [facility];  Service: Urology;  Laterality: Bilateral;   CYSTOSCOPY WITH HOLMIUM  LASER LITHOTRIPSY Bilateral 02/22/2015   Procedure: CYSTOSCOPY WITH HOLMIUM LASER BILATERAL LITHOTRIPSY;  Surgeon: Malen Gauze, MD;  Location: [facility];  Service: Urology;  Laterality: Bilateral;   CYSTOSCOPY WITH RETROGRADE PYELOGRAM, URETEROSCOPY AND STENT PLACEMENT Bilateral 01/18/2015   Procedure: CYSTOSCOPY WITH BILATERAL RETROGRADE PYELOGRAM, AND BILATERAL STENT PLACEMENT;  Surgeon: Malen Gauze, MD;  Location: [facility];  Service: Urology;  Laterality: Bilateral;   CYSTOSCOPY/RETROGRADE/URETEROSCOPY/STONE EXTRACTION WITH BASKET Bilateral 02/22/2015   Procedure: CYSTOSCOPY/BILATERAL RETROGRADE/BILATERAL URETEROSCOPY/BILATERAL STONE EXTRACTION WITH BASKET;  Surgeon: Malen Gauze, MD;   Location: [facility];  Service: Urology;  Laterality: Bilateral;   KNEE SURGERY        Current Outpatient Medications:    acetaminophen (TYLENOL) 500 MG tablet, Take 1,000 mg by mouth every 6 (six) hours as needed for mild pain., Disp: , Rfl:    allopurinol (ZYLOPRIM) 300 MG tablet, Take 300 mg by mouth daily., Disp: , Rfl:    Cholecalciferol (VITAMIN D-3 PO), Take 1 tablet by mouth daily., Disp: , Rfl:    furosemide (LASIX) 80 MG tablet, TAKE 1 TABLET BY MOUTH TWICE DAILY. (Patient taking differently: 40 mg. Taking 80 in the am and taking 40 in the pm), Disp: 180 tablet, Rfl: 1   metoprolol tartrate (LOPRESSOR) 25 MG tablet, Take 2 tablets (50 mg total) by mouth 2 (two) times daily., Disp: 60 tablet, Rfl: 2   omeprazole (PRILOSEC) 20 MG capsule, Take 20 mg by mouth daily as needed (acid reflux)., Disp: , Rfl:    potassium chloride SA (KLOR-CON) 20 MEQ tablet, TAKE 1 TABLET BY MOUTH ONCE DAILY. (Patient not taking: Reported on 05/17/2021), Disp: 90 tablet, Rfl: 3   rivaroxaban (XARELTO) 20 MG TABS tablet, TAKE 1 TABLET(20 MG) BY MOUTH DAILY WITH SUPPER, Disp: 30 tablet, Rfl: 5   rosuvastatin (CRESTOR) 20 MG tablet, Take 1 tablet (20 mg total) by mouth daily., Disp: 90 tablet, Rfl: 0    Physical Exam: There were no vitals taken for this visit.    Affect appropriate Overweight male HEENT: normal Neck supple with no adenopathy JVP normal no bruits no thyromegaly Lungs clear with no wheezing and good diaphragmatic motion Heart:  S1/S2 no murmur, no rub, gallop or click PMI normal Abdomen: benighn, BS positve, no tenderness, no AAA no bruit.  No HSM or HJR Distal pulses intact with no bruits No edema Neuro non-focal Skin warm and dry No muscular weakness   Labs:   Lab Results  Component Value Date   WBC 10.0 05/23/2022   HGB 11.0 (L) 05/23/2022   HCT 36.3 (L) 05/23/2022   MCV 95.8 05/23/2022   PLT 242 05/23/2022   No results for input(s): "NA", "K", "CL", "CO2", "BUN",  "CREATININE", "CALCIUM", "PROT", "BILITOT", "ALKPHOS", "ALT", "AST", "GLUCOSE" in the last 168 hours.  Invalid input(s): "LABALBU" Lab Results  Component Value Date   CKTOTAL 15 (L) 01/25/2015   CKMB 1.9 05/22/2011   TROPONINI <0.03 02/23/2015   No results found for: "CHOL" No results found for: "HDL" No results found for: "LDLCALC" No results found for: "TRIG" No results found for: "CHOLHDL" No results found for: "LDLDIRECT"    Radiology: No results found.  EKG: 2019 afib rate 76 low voltage    ASSESSMENT AND PLAN:   Afib:  chronic good rate control and anticoagulation Labs with primary ok No bleeding issues continue Xarelto Hct 36.3 PLT 242 Cr 1.35 on labs 05/23/22  OSA:  f/u primary too heavy for Inspiris device and will not wear CPAP HLD:  no documented vascular dx continue zocor Edema: dependant from obesity on lasix   F/U in a year   Signed: Charlton Haws 02/04/2023, 1:49 PM

## 2023-02-10 ENCOUNTER — Ambulatory Visit: Payer: Medicare Other | Attending: Cardiovascular Disease | Admitting: Cardiovascular Disease

## 2023-02-10 ENCOUNTER — Encounter: Payer: Self-pay | Admitting: Cardiovascular Disease

## 2023-02-12 DIAGNOSIS — I110 Hypertensive heart disease with heart failure: Secondary | ICD-10-CM | POA: Diagnosis not present

## 2023-02-12 DIAGNOSIS — Z23 Encounter for immunization: Secondary | ICD-10-CM | POA: Diagnosis not present

## 2023-02-12 DIAGNOSIS — E782 Mixed hyperlipidemia: Secondary | ICD-10-CM | POA: Diagnosis not present

## 2023-02-12 DIAGNOSIS — E559 Vitamin D deficiency, unspecified: Secondary | ICD-10-CM | POA: Diagnosis not present

## 2023-02-12 DIAGNOSIS — Z6841 Body Mass Index (BMI) 40.0 and over, adult: Secondary | ICD-10-CM | POA: Diagnosis not present

## 2023-02-12 DIAGNOSIS — E538 Deficiency of other specified B group vitamins: Secondary | ICD-10-CM | POA: Diagnosis not present

## 2023-02-12 DIAGNOSIS — Z0001 Encounter for general adult medical examination with abnormal findings: Secondary | ICD-10-CM | POA: Diagnosis not present

## 2023-02-12 DIAGNOSIS — I4891 Unspecified atrial fibrillation: Secondary | ICD-10-CM | POA: Diagnosis not present

## 2023-02-12 DIAGNOSIS — I509 Heart failure, unspecified: Secondary | ICD-10-CM | POA: Diagnosis not present

## 2023-02-20 DIAGNOSIS — Z993 Dependence on wheelchair: Secondary | ICD-10-CM | POA: Diagnosis not present

## 2023-02-20 DIAGNOSIS — I5032 Chronic diastolic (congestive) heart failure: Secondary | ICD-10-CM | POA: Diagnosis not present

## 2023-02-20 DIAGNOSIS — M6281 Muscle weakness (generalized): Secondary | ICD-10-CM | POA: Diagnosis not present

## 2023-02-20 DIAGNOSIS — M80062D Age-related osteoporosis with current pathological fracture, left lower leg, subsequent encounter for fracture with routine healing: Secondary | ICD-10-CM | POA: Diagnosis not present

## 2023-02-26 DIAGNOSIS — Z993 Dependence on wheelchair: Secondary | ICD-10-CM | POA: Diagnosis not present

## 2023-02-26 DIAGNOSIS — M80062D Age-related osteoporosis with current pathological fracture, left lower leg, subsequent encounter for fracture with routine healing: Secondary | ICD-10-CM | POA: Diagnosis not present

## 2023-02-26 DIAGNOSIS — I5032 Chronic diastolic (congestive) heart failure: Secondary | ICD-10-CM | POA: Diagnosis not present

## 2023-02-26 DIAGNOSIS — M6281 Muscle weakness (generalized): Secondary | ICD-10-CM | POA: Diagnosis not present

## 2023-03-22 DIAGNOSIS — M6281 Muscle weakness (generalized): Secondary | ICD-10-CM | POA: Diagnosis not present

## 2023-03-22 DIAGNOSIS — Z993 Dependence on wheelchair: Secondary | ICD-10-CM | POA: Diagnosis not present

## 2023-03-22 DIAGNOSIS — M80062D Age-related osteoporosis with current pathological fracture, left lower leg, subsequent encounter for fracture with routine healing: Secondary | ICD-10-CM | POA: Diagnosis not present

## 2023-03-22 DIAGNOSIS — I5032 Chronic diastolic (congestive) heart failure: Secondary | ICD-10-CM | POA: Diagnosis not present

## 2023-03-28 DIAGNOSIS — M6281 Muscle weakness (generalized): Secondary | ICD-10-CM | POA: Diagnosis not present

## 2023-03-28 DIAGNOSIS — M80062D Age-related osteoporosis with current pathological fracture, left lower leg, subsequent encounter for fracture with routine healing: Secondary | ICD-10-CM | POA: Diagnosis not present

## 2023-03-28 DIAGNOSIS — Z993 Dependence on wheelchair: Secondary | ICD-10-CM | POA: Diagnosis not present

## 2023-03-28 DIAGNOSIS — I5032 Chronic diastolic (congestive) heart failure: Secondary | ICD-10-CM | POA: Diagnosis not present

## 2023-04-25 ENCOUNTER — Telehealth: Payer: Self-pay | Admitting: Cardiovascular Disease

## 2023-04-25 NOTE — Telephone Encounter (Signed)
Called pt in regards to recall on file for Dr. Eden Emms, pt did not wish to sch at this time, stated he's not able to get out at this time. Would call back at a later time to sch apt if he needed it.   Recall deleted

## 2023-04-28 DIAGNOSIS — M80062D Age-related osteoporosis with current pathological fracture, left lower leg, subsequent encounter for fracture with routine healing: Secondary | ICD-10-CM | POA: Diagnosis not present

## 2023-04-28 DIAGNOSIS — Z993 Dependence on wheelchair: Secondary | ICD-10-CM | POA: Diagnosis not present

## 2023-04-28 DIAGNOSIS — I5032 Chronic diastolic (congestive) heart failure: Secondary | ICD-10-CM | POA: Diagnosis not present

## 2023-04-28 DIAGNOSIS — M6281 Muscle weakness (generalized): Secondary | ICD-10-CM | POA: Diagnosis not present

## 2023-05-29 DIAGNOSIS — I5032 Chronic diastolic (congestive) heart failure: Secondary | ICD-10-CM | POA: Diagnosis not present

## 2023-05-29 DIAGNOSIS — Z993 Dependence on wheelchair: Secondary | ICD-10-CM | POA: Diagnosis not present

## 2023-05-29 DIAGNOSIS — M6281 Muscle weakness (generalized): Secondary | ICD-10-CM | POA: Diagnosis not present

## 2023-05-29 DIAGNOSIS — M80062D Age-related osteoporosis with current pathological fracture, left lower leg, subsequent encounter for fracture with routine healing: Secondary | ICD-10-CM | POA: Diagnosis not present

## 2023-06-26 DIAGNOSIS — M80062D Age-related osteoporosis with current pathological fracture, left lower leg, subsequent encounter for fracture with routine healing: Secondary | ICD-10-CM | POA: Diagnosis not present

## 2023-06-26 DIAGNOSIS — M6281 Muscle weakness (generalized): Secondary | ICD-10-CM | POA: Diagnosis not present

## 2023-06-26 DIAGNOSIS — Z993 Dependence on wheelchair: Secondary | ICD-10-CM | POA: Diagnosis not present

## 2023-06-26 DIAGNOSIS — I5032 Chronic diastolic (congestive) heart failure: Secondary | ICD-10-CM | POA: Diagnosis not present

## 2023-07-27 DIAGNOSIS — Z993 Dependence on wheelchair: Secondary | ICD-10-CM | POA: Diagnosis not present

## 2023-07-27 DIAGNOSIS — M80062D Age-related osteoporosis with current pathological fracture, left lower leg, subsequent encounter for fracture with routine healing: Secondary | ICD-10-CM | POA: Diagnosis not present

## 2023-07-27 DIAGNOSIS — I5032 Chronic diastolic (congestive) heart failure: Secondary | ICD-10-CM | POA: Diagnosis not present

## 2023-07-27 DIAGNOSIS — M6281 Muscle weakness (generalized): Secondary | ICD-10-CM | POA: Diagnosis not present

## 2023-08-18 DIAGNOSIS — R053 Chronic cough: Secondary | ICD-10-CM | POA: Diagnosis not present

## 2023-08-20 ENCOUNTER — Other Ambulatory Visit (HOSPITAL_COMMUNITY): Payer: Self-pay | Admitting: Family Medicine

## 2023-08-20 DIAGNOSIS — R053 Chronic cough: Secondary | ICD-10-CM

## 2023-08-26 DIAGNOSIS — I5032 Chronic diastolic (congestive) heart failure: Secondary | ICD-10-CM | POA: Diagnosis not present

## 2023-08-26 DIAGNOSIS — M80062D Age-related osteoporosis with current pathological fracture, left lower leg, subsequent encounter for fracture with routine healing: Secondary | ICD-10-CM | POA: Diagnosis not present

## 2023-08-26 DIAGNOSIS — Z993 Dependence on wheelchair: Secondary | ICD-10-CM | POA: Diagnosis not present

## 2023-08-26 DIAGNOSIS — M6281 Muscle weakness (generalized): Secondary | ICD-10-CM | POA: Diagnosis not present

## 2023-09-08 ENCOUNTER — Other Ambulatory Visit: Payer: Self-pay

## 2023-09-08 ENCOUNTER — Encounter (HOSPITAL_COMMUNITY): Payer: Self-pay | Admitting: Emergency Medicine

## 2023-09-08 ENCOUNTER — Inpatient Hospital Stay (HOSPITAL_COMMUNITY)
Admission: EM | Admit: 2023-09-08 | Discharge: 2023-09-28 | DRG: 291 | Disposition: A | Discharge status: Skilled Nursing Facility | Attending: Internal Medicine | Admitting: Internal Medicine

## 2023-09-08 ENCOUNTER — Emergency Department (HOSPITAL_COMMUNITY)

## 2023-09-08 DIAGNOSIS — J9621 Acute and chronic respiratory failure with hypoxia: Secondary | ICD-10-CM | POA: Diagnosis present

## 2023-09-08 DIAGNOSIS — Z87891 Personal history of nicotine dependence: Secondary | ICD-10-CM

## 2023-09-08 DIAGNOSIS — Z7189 Other specified counseling: Secondary | ICD-10-CM | POA: Diagnosis not present

## 2023-09-08 DIAGNOSIS — N1831 Chronic kidney disease, stage 3a: Secondary | ICD-10-CM | POA: Diagnosis not present

## 2023-09-08 DIAGNOSIS — I071 Rheumatic tricuspid insufficiency: Secondary | ICD-10-CM | POA: Diagnosis not present

## 2023-09-08 DIAGNOSIS — K219 Gastro-esophageal reflux disease without esophagitis: Secondary | ICD-10-CM | POA: Diagnosis present

## 2023-09-08 DIAGNOSIS — Z5941 Food insecurity: Secondary | ICD-10-CM

## 2023-09-08 DIAGNOSIS — E662 Morbid (severe) obesity with alveolar hypoventilation: Secondary | ICD-10-CM | POA: Diagnosis not present

## 2023-09-08 DIAGNOSIS — L89313 Pressure ulcer of right buttock, stage 3: Secondary | ICD-10-CM | POA: Diagnosis not present

## 2023-09-08 DIAGNOSIS — M109 Gout, unspecified: Secondary | ICD-10-CM | POA: Diagnosis not present

## 2023-09-08 DIAGNOSIS — I509 Heart failure, unspecified: Secondary | ICD-10-CM

## 2023-09-08 DIAGNOSIS — I4891 Unspecified atrial fibrillation: Secondary | ICD-10-CM | POA: Diagnosis not present

## 2023-09-08 DIAGNOSIS — E873 Alkalosis: Secondary | ICD-10-CM | POA: Diagnosis not present

## 2023-09-08 DIAGNOSIS — E876 Hypokalemia: Secondary | ICD-10-CM | POA: Diagnosis present

## 2023-09-08 DIAGNOSIS — F1010 Alcohol abuse, uncomplicated: Secondary | ICD-10-CM | POA: Diagnosis not present

## 2023-09-08 DIAGNOSIS — I10 Essential (primary) hypertension: Secondary | ICD-10-CM | POA: Diagnosis present

## 2023-09-08 DIAGNOSIS — I5033 Acute on chronic diastolic (congestive) heart failure: Secondary | ICD-10-CM | POA: Diagnosis not present

## 2023-09-08 DIAGNOSIS — Z8249 Family history of ischemic heart disease and other diseases of the circulatory system: Secondary | ICD-10-CM

## 2023-09-08 DIAGNOSIS — J9601 Acute respiratory failure with hypoxia: Secondary | ICD-10-CM | POA: Diagnosis not present

## 2023-09-08 DIAGNOSIS — I110 Hypertensive heart disease with heart failure: Secondary | ICD-10-CM | POA: Diagnosis not present

## 2023-09-08 DIAGNOSIS — I4821 Permanent atrial fibrillation: Secondary | ICD-10-CM | POA: Diagnosis not present

## 2023-09-08 DIAGNOSIS — E785 Hyperlipidemia, unspecified: Secondary | ICD-10-CM | POA: Diagnosis not present

## 2023-09-08 DIAGNOSIS — D631 Anemia in chronic kidney disease: Secondary | ICD-10-CM | POA: Diagnosis present

## 2023-09-08 DIAGNOSIS — I4819 Other persistent atrial fibrillation: Secondary | ICD-10-CM | POA: Diagnosis not present

## 2023-09-08 DIAGNOSIS — J9622 Acute and chronic respiratory failure with hypercapnia: Secondary | ICD-10-CM | POA: Diagnosis present

## 2023-09-08 DIAGNOSIS — Z7901 Long term (current) use of anticoagulants: Secondary | ICD-10-CM

## 2023-09-08 DIAGNOSIS — I2729 Other secondary pulmonary hypertension: Secondary | ICD-10-CM | POA: Diagnosis present

## 2023-09-08 DIAGNOSIS — R0902 Hypoxemia: Secondary | ICD-10-CM | POA: Diagnosis not present

## 2023-09-08 DIAGNOSIS — L8990 Pressure ulcer of unspecified site, unspecified stage: Secondary | ICD-10-CM | POA: Insufficient documentation

## 2023-09-08 DIAGNOSIS — Z452 Encounter for adjustment and management of vascular access device: Secondary | ICD-10-CM | POA: Diagnosis not present

## 2023-09-08 DIAGNOSIS — I5032 Chronic diastolic (congestive) heart failure: Secondary | ICD-10-CM | POA: Diagnosis not present

## 2023-09-08 DIAGNOSIS — Z604 Social exclusion and rejection: Secondary | ICD-10-CM | POA: Diagnosis present

## 2023-09-08 DIAGNOSIS — M6281 Muscle weakness (generalized): Secondary | ICD-10-CM | POA: Diagnosis not present

## 2023-09-08 DIAGNOSIS — E8770 Fluid overload, unspecified: Secondary | ICD-10-CM | POA: Diagnosis not present

## 2023-09-08 DIAGNOSIS — I130 Hypertensive heart and chronic kidney disease with heart failure and stage 1 through stage 4 chronic kidney disease, or unspecified chronic kidney disease: Secondary | ICD-10-CM | POA: Diagnosis not present

## 2023-09-08 DIAGNOSIS — Z79899 Other long term (current) drug therapy: Secondary | ICD-10-CM | POA: Diagnosis not present

## 2023-09-08 DIAGNOSIS — D649 Anemia, unspecified: Principal | ICD-10-CM

## 2023-09-08 DIAGNOSIS — R609 Edema, unspecified: Secondary | ICD-10-CM | POA: Diagnosis not present

## 2023-09-08 DIAGNOSIS — K5909 Other constipation: Secondary | ICD-10-CM | POA: Diagnosis present

## 2023-09-08 DIAGNOSIS — R0989 Other specified symptoms and signs involving the circulatory and respiratory systems: Secondary | ICD-10-CM | POA: Diagnosis not present

## 2023-09-08 DIAGNOSIS — R54 Age-related physical debility: Secondary | ICD-10-CM | POA: Diagnosis present

## 2023-09-08 DIAGNOSIS — Z6841 Body Mass Index (BMI) 40.0 and over, adult: Secondary | ICD-10-CM

## 2023-09-08 DIAGNOSIS — Z5948 Other specified lack of adequate food: Secondary | ICD-10-CM

## 2023-09-08 DIAGNOSIS — I2781 Cor pulmonale (chronic): Secondary | ICD-10-CM | POA: Diagnosis present

## 2023-09-08 DIAGNOSIS — R262 Difficulty in walking, not elsewhere classified: Secondary | ICD-10-CM | POA: Diagnosis present

## 2023-09-08 DIAGNOSIS — Z91148 Patient's other noncompliance with medication regimen for other reason: Secondary | ICD-10-CM

## 2023-09-08 DIAGNOSIS — Z993 Dependence on wheelchair: Secondary | ICD-10-CM | POA: Diagnosis not present

## 2023-09-08 DIAGNOSIS — M80062D Age-related osteoporosis with current pathological fracture, left lower leg, subsequent encounter for fracture with routine healing: Secondary | ICD-10-CM | POA: Diagnosis not present

## 2023-09-08 DIAGNOSIS — Z515 Encounter for palliative care: Secondary | ICD-10-CM | POA: Diagnosis not present

## 2023-09-08 DIAGNOSIS — R531 Weakness: Secondary | ICD-10-CM | POA: Diagnosis not present

## 2023-09-08 LAB — CBC WITH DIFFERENTIAL/PLATELET
Abs Immature Granulocytes: 0.03 10*3/uL (ref 0.00–0.07)
Basophils Absolute: 0 10*3/uL (ref 0.0–0.1)
Basophils Relative: 1 %
Eosinophils Absolute: 0.1 10*3/uL (ref 0.0–0.5)
Eosinophils Relative: 1 %
HCT: 29.6 % — ABNORMAL LOW (ref 39.0–52.0)
Hemoglobin: 7.6 g/dL — ABNORMAL LOW (ref 13.0–17.0)
Immature Granulocytes: 0 %
Lymphocytes Relative: 13 %
Lymphs Abs: 0.9 10*3/uL (ref 0.7–4.0)
MCH: 21.4 pg — ABNORMAL LOW (ref 26.0–34.0)
MCHC: 25.7 g/dL — ABNORMAL LOW (ref 30.0–36.0)
MCV: 83.4 fL (ref 80.0–100.0)
Monocytes Absolute: 0.5 10*3/uL (ref 0.1–1.0)
Monocytes Relative: 8 %
Neutro Abs: 5.3 10*3/uL (ref 1.7–7.7)
Neutrophils Relative %: 77 %
Platelets: 300 10*3/uL (ref 150–400)
RBC: 3.55 MIL/uL — ABNORMAL LOW (ref 4.22–5.81)
RDW: 21.9 % — ABNORMAL HIGH (ref 11.5–15.5)
WBC: 6.9 10*3/uL (ref 4.0–10.5)
nRBC: 0.4 % — ABNORMAL HIGH (ref 0.0–0.2)

## 2023-09-08 LAB — COMPREHENSIVE METABOLIC PANEL WITH GFR
ALT: 9 U/L (ref 0–44)
AST: 21 U/L (ref 15–41)
Albumin: 3 g/dL — ABNORMAL LOW (ref 3.5–5.0)
Alkaline Phosphatase: 62 U/L (ref 38–126)
Anion gap: 8 (ref 5–15)
BUN: 21 mg/dL (ref 8–23)
CO2: 28 mmol/L (ref 22–32)
Calcium: 9.4 mg/dL (ref 8.9–10.3)
Chloride: 107 mmol/L (ref 98–111)
Creatinine, Ser: 1.4 mg/dL — ABNORMAL HIGH (ref 0.61–1.24)
GFR, Estimated: 53 mL/min — ABNORMAL LOW (ref >60)
Glucose, Bld: 106 mg/dL — ABNORMAL HIGH (ref 70–99)
Potassium: 3.7 mmol/L (ref 3.5–5.1)
Sodium: 143 mmol/L (ref 135–145)
Total Bilirubin: 1 mg/dL (ref 0.0–1.2)
Total Protein: 6 g/dL — ABNORMAL LOW (ref 6.5–8.1)

## 2023-09-08 LAB — TROPONIN I (HIGH SENSITIVITY)
Troponin I (High Sensitivity): 9 ng/L (ref <18)
Troponin I (High Sensitivity): 9 ng/L (ref <18)

## 2023-09-08 LAB — MAGNESIUM: Magnesium: 2.4 mg/dL (ref 1.7–2.4)

## 2023-09-08 LAB — POC OCCULT BLOOD, ED: Occult Blood: NEGATIVE

## 2023-09-08 LAB — BRAIN NATRIURETIC PEPTIDE: B Natriuretic Peptide: 875 pg/mL — ABNORMAL HIGH (ref 0.0–100.0)

## 2023-09-08 LAB — LACTIC ACID, PLASMA: Lactic Acid, Venous: 1 mmol/L (ref 0.5–1.9)

## 2023-09-08 MED ORDER — ACETAMINOPHEN 325 MG PO TABS
650.0000 mg | ORAL_TABLET | Freq: Four times a day (QID) | ORAL | Status: DC | PRN
  Administered 2023-09-10 – 2023-09-16 (×5): 650 mg via ORAL
  Filled 2023-09-08 (×6): qty 2

## 2023-09-08 MED ORDER — ROSUVASTATIN CALCIUM 20 MG PO TABS
20.0000 mg | ORAL_TABLET | Freq: Every day | ORAL | Status: DC
  Administered 2023-09-09 – 2023-09-28 (×19): 20 mg via ORAL
  Filled 2023-09-08 (×20): qty 1

## 2023-09-08 MED ORDER — MELATONIN 3 MG PO TABS
3.0000 mg | ORAL_TABLET | Freq: Every day | ORAL | Status: DC
  Administered 2023-09-08 – 2023-09-27 (×18): 3 mg via ORAL
  Filled 2023-09-08 (×20): qty 1

## 2023-09-08 MED ORDER — ALLOPURINOL 100 MG PO TABS
300.0000 mg | ORAL_TABLET | Freq: Every day | ORAL | Status: DC
  Administered 2023-09-09 – 2023-09-28 (×19): 300 mg via ORAL
  Filled 2023-09-08 (×20): qty 3

## 2023-09-08 MED ORDER — RIVAROXABAN 20 MG PO TABS
20.0000 mg | ORAL_TABLET | Freq: Every day | ORAL | Status: DC
  Administered 2023-09-08 – 2023-09-27 (×19): 20 mg via ORAL
  Filled 2023-09-08 (×20): qty 1

## 2023-09-08 MED ORDER — METOPROLOL TARTRATE 50 MG PO TABS
50.0000 mg | ORAL_TABLET | Freq: Two times a day (BID) | ORAL | Status: DC
  Administered 2023-09-08: 50 mg via ORAL
  Filled 2023-09-08: qty 1

## 2023-09-08 MED ORDER — FUROSEMIDE 10 MG/ML IJ SOLN
40.0000 mg | Freq: Two times a day (BID) | INTRAMUSCULAR | Status: DC
Start: 2023-09-09 — End: 2023-09-11
  Administered 2023-09-09 – 2023-09-10 (×4): 40 mg via INTRAVENOUS
  Filled 2023-09-08 (×5): qty 4

## 2023-09-08 MED ORDER — THIAMINE MONONITRATE 100 MG PO TABS
100.0000 mg | ORAL_TABLET | Freq: Every day | ORAL | Status: DC
  Administered 2023-09-09 – 2023-09-28 (×19): 100 mg via ORAL
  Filled 2023-09-08 (×20): qty 1

## 2023-09-08 MED ORDER — FUROSEMIDE 10 MG/ML IJ SOLN
40.0000 mg | INTRAMUSCULAR | Status: AC
  Administered 2023-09-08: 40 mg via INTRAVENOUS
  Filled 2023-09-08: qty 4

## 2023-09-08 MED ORDER — LORAZEPAM 1 MG PO TABS: 1.0000 mg | ORAL_TABLET | ORAL | Status: AC | PRN

## 2023-09-08 MED ORDER — POTASSIUM CHLORIDE CRYS ER 20 MEQ PO TBCR
40.0000 meq | EXTENDED_RELEASE_TABLET | Freq: Once | ORAL | Status: AC
  Administered 2023-09-08: 40 meq via ORAL
  Filled 2023-09-08: qty 2

## 2023-09-08 MED ORDER — FOLIC ACID 1 MG PO TABS
1.0000 mg | ORAL_TABLET | Freq: Every day | ORAL | Status: DC
  Administered 2023-09-09 – 2023-09-28 (×19): 1 mg via ORAL
  Filled 2023-09-08 (×20): qty 1

## 2023-09-08 MED ORDER — POLYETHYLENE GLYCOL 3350 17 G PO PACK
17.0000 g | PACK | Freq: Every day | ORAL | Status: DC | PRN
  Filled 2023-09-08 (×2): qty 1

## 2023-09-08 MED ORDER — ONDANSETRON HCL 4 MG PO TABS
4.0000 mg | ORAL_TABLET | Freq: Four times a day (QID) | ORAL | Status: DC | PRN
Start: 2023-09-08 — End: 2023-09-28

## 2023-09-08 MED ORDER — LORAZEPAM 2 MG/ML IJ SOLN: 1.0000 mg | INTRAMUSCULAR | Status: AC | PRN

## 2023-09-08 MED ORDER — ACETAMINOPHEN 650 MG RE SUPP: 650.0000 mg | Freq: Four times a day (QID) | RECTAL | Status: DC | PRN

## 2023-09-08 MED ORDER — THIAMINE HCL 100 MG/ML IJ SOLN
100.0000 mg | Freq: Every day | INTRAMUSCULAR | Status: DC
  Filled 2023-09-08 (×2): qty 2

## 2023-09-08 MED ORDER — PANTOPRAZOLE SODIUM 40 MG PO TBEC: 40.0000 mg | DELAYED_RELEASE_TABLET | ORAL | Status: DC

## 2023-09-08 MED ORDER — ADULT MULTIVITAMIN W/MINERALS CH
1.0000 | ORAL_TABLET | Freq: Every day | ORAL | Status: DC
  Administered 2023-09-09 – 2023-09-28 (×19): 1 via ORAL
  Filled 2023-09-08 (×20): qty 1

## 2023-09-08 MED ORDER — ONDANSETRON HCL 4 MG/2ML IJ SOLN: 4.0000 mg | Freq: Four times a day (QID) | INTRAMUSCULAR | Status: DC | PRN

## 2023-09-08 NOTE — H&P (Addendum)
 History and Physical    Frederick Brown:[id number] DOB: 12/17/1950 DOA: 09/08/2023  PCP: Minus Amel, MD   Patient coming from: Home  I have personally briefly reviewed patient's old medical records in [facility]  Chief Complaint: Difficulty breathing  HPI: Frederick Brown is a 73 y.o. male with medical history significant for diastolic CHF, alcohol abuse, atrial fibrillation, hypertension, gout, morbid obesity. Patient presented to the ED with complaints of generalized weakness, and increased difficulty breathing with exertion with bilateral lower extremity swelling for the past 3 weeks, with abdominal distention.  He supposed to be on Lasix  40 mg TID but has not been compliant.  His alcohol daily-about 2 glasses of vodka daily.  He lives alone and has had difficulty ambulating (due to weakness, dyspnea and leg swelling) to get his alcohol beverages hence he has not had any in the past 3 to 4 days.  Denies hallucinations or shakes. He denies history of alcohol withdrawal.  ED Course: Temperature 98.  Heart rate 66-73.  Respiratory rate 17-23.  Blood pressure systolic 120s.  O2 sats 85% on room air, placed on 3 L-sats improved to- 90 to 96%. BNP elevated at 275.  Troponin 9. Chest x-ray shows cardiomegaly with findings of interstitial edema, small right and trace left pleural effusions. IV Lasix  40 mg x 1 given. Hospitalist to admit for CHF exacerbation.  Review of Systems: As per HPI all other systems reviewed and negative.  Past Medical History:  Diagnosis Date   A-fib [facility])    Alcohol abuse    CHF (congestive heart failure) (HCC)    Chronic respiratory failure with hypoxia (HCC) 05/28/2011   Cor pulmonale, chronic (HCC) February 2013   Degenerative joint disease    Fracture of great toe, left, closed 05/28/2011   Fracture of metatarsal of left foot, closed 05/28/2011   Gastroesophageal reflux disease    Hypertension    Obesity hypoventilation syndrome (HCC) February 2013    Obesity, morbid (more than 100 lbs over ideal weight or BMI > 40) (HCC)    Pickwickian syndrome (HCC)    Umbilical hernia    Urticarial rash 05/31/2011   From Cipro    Vitamin B12 deficiency 05/22/2011    Past Surgical History:  Procedure Laterality Date   CHOLECYSTECTOMY     CYSTOSCOPY     CYSTOSCOPY W/ URETERAL STENT PLACEMENT Bilateral 02/22/2015   Procedure: CYSTOSCOPY WITH BILATERAL STENT EXCHANGE;  Surgeon: Marco Severs, MD;  Location: [facility];  Service: Urology;  Laterality: Bilateral;   CYSTOSCOPY WITH HOLMIUM LASER LITHOTRIPSY Bilateral 02/22/2015   Procedure: CYSTOSCOPY WITH HOLMIUM LASER BILATERAL LITHOTRIPSY;  Surgeon: Marco Severs, MD;  Location: [facility];  Service: Urology;  Laterality: Bilateral;   CYSTOSCOPY WITH RETROGRADE PYELOGRAM, URETEROSCOPY AND STENT PLACEMENT Bilateral 01/18/2015   Procedure: CYSTOSCOPY WITH BILATERAL RETROGRADE PYELOGRAM, AND BILATERAL STENT PLACEMENT;  Surgeon: Marco Severs, MD;  Location: [facility];  Service: Urology;  Laterality: Bilateral;   CYSTOSCOPY/RETROGRADE/URETEROSCOPY/STONE EXTRACTION WITH BASKET Bilateral 02/22/2015   Procedure: CYSTOSCOPY/BILATERAL RETROGRADE/BILATERAL URETEROSCOPY/BILATERAL STONE EXTRACTION WITH BASKET;  Surgeon: Marco Severs, MD;  Location: [facility];  Service: Urology;  Laterality: Bilateral;   KNEE SURGERY       reports that he quit smoking about 42 years ago. His smoking use included cigarettes. He has never used smokeless tobacco. He reports current alcohol use of about 1.0 standard drink of alcohol per week. He reports that he does not use drugs.  No Known Allergies  Family History  Problem Relation Age of Onset   Heart attack Father    Heart failure Father    Stroke Father    Hypertension Father     Prior to Admission medications   Medication Sig Start Date End Date Taking? Authorizing Provider  acetaminophen  (TYLENOL ) 500 MG tablet Take 1,000 mg by mouth every 6 (six) hours as needed  for mild pain.    [provider]  allopurinol  (ZYLOPRIM ) 300 MG tablet Take 300 mg by mouth daily.    [provider]  Cholecalciferol (VITAMIN D -3 PO) Take 1 tablet by mouth daily.    [provider]  furosemide  (LASIX ) 40 MG tablet Take 40 mg by mouth daily. 10/10/13   [provider]  metoprolol  tartrate (LOPRESSOR ) 25 MG tablet Take 2 tablets (50 mg total) by mouth 2 (two) times daily. 02/25/15   Tyra Galley, MD  omeprazole (PRILOSEC) 40 MG capsule Take 40 mg by mouth daily. 06/14/23   [provider]  potassium chloride  SA (KLOR-CON ) 20 MEQ tablet TAKE 1 TABLET BY MOUTH ONCE DAILY. Patient not taking: Reported on 05/17/2021 07/26/19   Koneswaran, Suresh A, MD  rivaroxaban  (XARELTO ) 20 MG TABS tablet TAKE 1 TABLET(20 MG) BY MOUTH DAILY WITH SUPPER 01/22/23   Loyde Rule, MD  rosuvastatin  (CRESTOR ) 20 MG tablet Take 1 tablet (20 mg total) by mouth daily. 11/05/22   Loyde Rule, MD    Physical Exam: Vitals:   09/08/23 1637 09/08/23 1700 09/08/23 1745 09/08/23 1900  BP:  (!) 121/55 (!) 127/55   Pulse:  67 66 68  Resp:  (!) 22 (!) 23 17  Temp:      TempSrc:      SpO2: 94% 96% 93% 90%  Weight:      Height:        Constitutional: NAD, calm, comfortable Vitals:   09/08/23 1637 09/08/23 1700 09/08/23 1745 09/08/23 1900  BP:  (!) 121/55 (!) 127/55   Pulse:  67 66 68  Resp:  (!) 22 (!) 23 17  Temp:      TempSrc:      SpO2: 94% 96% 93% 90%  Weight:      Height:       Eyes: PERRL, lids and conjunctivae normal ENMT: Mucous membranes are moist.  Neck: normal, supple, no masses, no thyromegaly Respiratory: Anterior auscultation, clear to auscultation bilaterally, no wheezing, no crackles. Normal respiratory effort. No accessory muscle use.  Cardiovascular: Regular rate and rhythm, no murmurs / rubs / gallops.  At least 2+ bilateral pitting lower extremity edema to knees, extremities warm. Abdomen: Obese,soft, no tenderness, no masses  palpated.  Musculoskeletal: no clubbing / cyanosis. No joint deformity upper and lower extremities.   Skin: no rashes, lesions, ulcers. No induration Neurologic: No facial asymmetry, good equal grip strength bilateral 5 strength bilateral lower extremities/.  Reports abnormal speech today 2/2 current throat congestion. Psychiatric: Normal judgment and insight. Alert and oriented x 3. Normal mood.   Labs on Admission: I have personally reviewed following labs and imaging studies  CBC: Recent Labs  Lab 09/08/23 1719  WBC 6.9  NEUTROABS 5.3  HGB 7.6*  HCT 29.6*  MCV 83.4  PLT 300   Basic Metabolic Panel: Recent Labs  Lab 09/08/23 1719  NA 143  K 3.7  CL 107  CO2 28  GLUCOSE 106*  BUN 21  CREATININE 1.40*  CALCIUM  9.4  MG 2.4   GFR: Estimated Creatinine Clearance: 79.5 mL/min (A) (by C-G formula based on  SCr of 1.4 mg/dL (H)). Liver Function Tests: Recent Labs  Lab 09/08/23 1719  AST 21  ALT 9  ALKPHOS 62  BILITOT 1.0  PROT 6.0*  ALBUMIN 3.0*   Radiological Exams on Admission: DG Chest Port 1 View Result Date: 09/08/2023 CLINICAL DATA:  sob EXAM: PORTABLE CHEST - 1 VIEW COMPARISON:  None available. FINDINGS: Diffuse bilateral perihilar interstitial opacities. Blunting of the right costophrenic angle with hazy airspace attenuation, likely reflecting a small effusion and atelectasis. Trace left pleural effusion. Cardiomegaly. Tortuous aorta with aortic atherosclerosis. No acute fracture or destructive lesions. Multilevel thoracic osteophytosis. IMPRESSION: Cardiomegaly with findings of interstitial edema. Small right and trace left pleural effusions. Electronically Signed   By: Rance Burrows M.D.   On: 09/08/2023 18:02    EKG: Independently reviewed.  Atrial fibrillation, rate 68, QTc 404.  Low voltage diffusely.  No significant change from prior.  Assessment/Plan Principal Problem:   Acute on chronic diastolic (congestive) heart failure (HCC) Active Problems:    Alcohol abuse   Atrial fibrillation (HCC)   Gout   Morbid obesity (HCC)   Essential hypertension   Assessment and Plan: * Acute on chronic diastolic (congestive) heart failure (HCC) Decompensated CHF, presenting with 2+ pitting bilateral lower extremity edema, chest x-ray showing interstitial edema.  BNP elevated at 875-no prior to compare.  Not compliant with home Lasix  40 TID.  Last echo 2018 EF 55% - IV Lasix  40 mg twice daily -Strict input output, daily weight, daily BMP - Obtain updated echocardiogram  Alcohol abuse Reports daily alcohol abuse-2 glasses of vodka daily, up until 3-4 nights ago-he has not been able to ambulate.  Denies history of alcohol withdrawals.  At this time he appears calm, with no sign of withdrawal. -CIWA as needed - Thiamine , folate, multivitamins -Potassium 3.7, Mag- 2.4 - I have counselled patient on quitting alcohol abuse, he is not ready to quit.  Atrial fibrillation (HCC) EKG shows atrial fibrillation.  Rate controlled.  Reports compliance with Xarelto . -Resume metoprolol , Xarelto   Essential hypertension Stable. -Resume metoprolol   Morbid obesity (HCC) BMI 58.5.  Gout Stable.  Resume allopurinol    DVT prophylaxis: Xarelto  Code Status: Full Code- Confirmed with patient at bedside Family Communication: None at bedside Disposition Plan: > 2 days Consults called: None  Admission status: Inpt Tele I certify that at the point of admission it is my clinical judgment that the patient will require inpatient hospital care spanning beyond 2 midnights from the point of admission due to high intensity of service, high risk for further deterioration and high frequency of surveillance required.    Author: Pati Bonine, MD 09/08/2023 9:39 PM  For on call review [email or web address].

## 2023-09-08 NOTE — Assessment & Plan Note (Signed)
 BMI 58.5.

## 2023-09-08 NOTE — Assessment & Plan Note (Signed)
 Stable.  Resume allopurinol 

## 2023-09-08 NOTE — ED Triage Notes (Signed)
 Pt c/o generalized weakness, sob and fluid retention.

## 2023-09-08 NOTE — Assessment & Plan Note (Addendum)
 Decompensated CHF, presenting with 2+ pitting bilateral lower extremity edema, chest x-ray showing interstitial edema.  BNP elevated at 875-no prior to compare.  Not compliant with home Lasix  40 TID.  Last echo 2018 EF 55% - IV Lasix  40 mg twice daily -Strict input output, daily weight, daily BMP - Obtain updated echocardiogram

## 2023-09-08 NOTE — Assessment & Plan Note (Addendum)
 Reports daily alcohol abuse-2 glasses of vodka daily, up until 3-4 nights ago-he has not been able to ambulate.  Denies history of alcohol withdrawals.  At this time he appears calm, with no sign of withdrawal. -CIWA as needed - Thiamine , folate, multivitamins -Potassium 3.7, Mag- 2.4 - I have counselled patient on quitting alcohol abuse, he is not ready to quit.

## 2023-09-08 NOTE — Assessment & Plan Note (Signed)
Stable. -Resume metoprolol 

## 2023-09-08 NOTE — ED Provider Notes (Signed)
 [place] EMERGENCY DEPARTMENT AT [facility] Provider Note   CSN: [id number] Arrival date & time: 09/08/23  1619     History  Chief Complaint  Patient presents with   Weakness    Frederick Brown is a 73 y.o. male.   Weakness    This patient is a 73 year old male, he has a history of multiple medical problems including possibly having atrial fibrillation chronically, congestive heart failure, hyperlipidemia, he is taking medications such as metoprolol  rivaroxaban  rosuvastatin  and Lasix .  Reportedly the patient does not take his Lasix  every day, he does not give an answer for this but over the last month his had persistent continuous generalized weakness which is gradually worsening and associated with a rapid increase in swelling and weight, increasing shortness of breath and a productive cough of clear phlegm.  Denies chest pain, headache, blurred vision, he states that he drinks about 2 glasses of liquor every night, he has not had any in several days.  This is primarily because he is having difficulty getting around the house, he lives by himself  Home Medications Prior to Admission medications   Medication Sig Start Date End Date Taking? Authorizing Provider  acetaminophen  (TYLENOL ) 500 MG tablet Take 1,000 mg by mouth every 6 (six) hours as needed for mild pain.    [provider]  allopurinol  (ZYLOPRIM ) 300 MG tablet Take 300 mg by mouth daily.    [provider]  Cholecalciferol (VITAMIN D -3 PO) Take 1 tablet by mouth daily.    [provider]  furosemide  (LASIX ) 80 MG tablet TAKE 1 TABLET BY MOUTH TWICE DAILY. Patient taking differently: 40 mg. Taking 80 in the am and taking 40 in the pm 08/13/19   Flavia Hughs, MD  metoprolol  tartrate (LOPRESSOR ) 25 MG tablet Take 2 tablets (50 mg total) by mouth 2 (two) times daily. 02/25/15   Tyra Galley, MD  omeprazole (PRILOSEC) 20 MG capsule Take 20 mg by mouth daily as needed (acid reflux).     [provider]  potassium chloride  SA (KLOR-CON ) 20 MEQ tablet TAKE 1 TABLET BY MOUTH ONCE DAILY. Patient not taking: Reported on 05/17/2021 07/26/19   Flavia Hughs, MD  rivaroxaban  (XARELTO ) 20 MG TABS tablet TAKE 1 TABLET(20 MG) BY MOUTH DAILY WITH SUPPER 01/22/23   Nishan, Peter C, MD  rosuvastatin  (CRESTOR ) 20 MG tablet Take 1 tablet (20 mg total) by mouth daily. 11/05/22   Nishan, Peter C, MD      Allergies    Patient has no known allergies.    Review of Systems   Review of Systems  Neurological:  Positive for weakness.  All other systems reviewed and are negative.   Physical Exam Updated Vital Signs Ht 1.778 m (5\' 10" )   Wt (!) 185 kg   BMI 58.52 kg/m  Physical Exam Vitals and nursing note reviewed.  Constitutional:      General: He is not in acute distress.    Appearance: He is well-developed.  HENT:     Head: Normocephalic and atraumatic.     Mouth/Throat:     Pharynx: No oropharyngeal exudate.  Eyes:     General: No scleral icterus.       Right eye: No discharge.        Left eye: No discharge.     Conjunctiva/sclera: Conjunctivae normal.     Pupils: Pupils are equal, round, and reactive to light.  Neck:     Thyroid : No thyromegaly.  Vascular: No JVD.  Cardiovascular:     Rate and Rhythm: Normal rate. Rhythm irregular.     Heart sounds: Normal heart sounds. No murmur heard.    No friction rub. No gallop.  Pulmonary:     Effort: Respiratory distress present.     Breath sounds: Wheezing and rales present.  Abdominal:     General: Bowel sounds are normal. There is no distension.     Palpations: Abdomen is soft. There is no mass.     Tenderness: There is no abdominal tenderness.  Musculoskeletal:        General: No tenderness. Normal range of motion.     Cervical back: Normal range of motion and neck supple.     Right lower leg: Edema present.     Left lower leg: Edema present.  Lymphadenopathy:     Cervical: No cervical adenopathy.   Skin:    General: Skin is warm and dry.     Findings: No erythema or rash.  Neurological:     Mental Status: He is alert.     Coordination: Coordination normal.  Psychiatric:        Behavior: Behavior normal.     ED Results / Procedures / Treatments   Labs (all labs ordered are listed, but only abnormal results are displayed) Labs Reviewed  CBC WITH DIFFERENTIAL/PLATELET  COMPREHENSIVE METABOLIC PANEL WITH GFR  LACTIC ACID, PLASMA  LACTIC ACID, PLASMA  MAGNESIUM   BRAIN NATRIURETIC PEPTIDE  TROPONIN I (HIGH SENSITIVITY)    EKG None  Radiology No results found.  Procedures .Critical Care  Performed by: Early Glisson, MD Authorized by: Early Glisson, MD   Critical care provider statement:    Critical care time (minutes):  45   Critical care time was exclusive of:  Separately billable procedures and treating other patients and teaching time   Critical care was necessary to treat or prevent imminent or life-threatening deterioration of the following conditions:  Cardiac failure (severe anemia)   Critical care was time spent personally by me on the following activities:  Development of treatment plan with patient or surrogate, discussions with consultants, evaluation of patient's response to treatment, examination of patient, obtaining history from patient or surrogate, review of old charts, re-evaluation of patient's condition, pulse oximetry, ordering and review of radiographic studies, ordering and review of laboratory studies and ordering and performing treatments and interventions   I assumed direction of critical care for this patient from another provider in my specialty: no     Care discussed with: admitting provider   Comments:           Medications Ordered in ED Medications  furosemide  (LASIX ) injection 40 mg (has no administration in time range)    ED Course/ Medical Decision Making/ A&P                                 Medical Decision Making Amount  and/or Complexity of Data Reviewed Labs: ordered. Radiology: ordered. ECG/medicine tests: ordered.  Risk Prescription drug management. Decision regarding hospitalization.   The patient has anasarca up onto his abdominal wall, he has vital signs which are concerning including hypoxia down to 90% on room air, he will require supplemental oxygen.   This patient presents to the ED for concern of shortness of breath and weight gain, this involves an extensive number of treatment options, and is a complaint that carries with it a high risk of complications  and morbidity.  The differential diagnosis includes just of heart failure, liver failure, fluid retention, kidney failure   Co morbidities / Chronic conditions that complicate the patient evaluation  As above the patient has known A-fib anticoagulated on Xarelto , last echocardiogram was 7 years ago, this revealed an ejection fraction of 50 to 55%   Additional history obtained:  Additional history obtained from EMR External records from outside source obtained and reviewed including electronic medical record Echocardiogram from 2018, follows in the office for his atrial fibrillation as recently as 2023, notes from that visit showed that the patient has a history of alcohol abuse congestive heart failure, chronic cor pulmonale, hypertension, pickwickian syndrome   Lab Tests:  I Ordered, and personally interpreted labs.  The pertinent results include:     Imaging Studies ordered:  I ordered imaging studies including chest x-ray with some pulmonary edema I independently visualized and interpreted imaging which showed pulmonary edema I agree with the radiologist interpretation   Cardiac Monitoring: / EKG:  The patient was maintained on a cardiac monitor.  I personally viewed and interpreted the cardiac monitored which showed an underlying rhythm of: afib   Problem List / ED Course / Critical interventions / Medication  management  Pt with severe anemia Rectal exam performed - no blood in stool - soft and brown. I ordered medication including lasix    Reevaluation of the patient after these medicines showed that the patient slight improvement. I have reviewed the patients home medicines and have made adjustments as needed   Consultations Obtained:  I requested consultation with the hospitalist Dr. Quintella Buck,  and discussed lab and imaging findings as well as pertinent plan - they recommend: admission   Social Determinants of Health:  Alcohol abuse - CHF   Test / Admission - Considered:  Admit to high level of care Acute pulmonary edema Severe anemia         Final Clinical Impression(s) / ED Diagnoses Final diagnoses:  Severe anemia  Acute congestive heart failure, unspecified heart failure type (HCC)     Early Glisson, MD 09/08/23 [phone]

## 2023-09-08 NOTE — Assessment & Plan Note (Signed)
 EKG shows atrial fibrillation.  Rate controlled.  Reports compliance with Xarelto . -Resume metoprolol , Xarelto 

## 2023-09-09 ENCOUNTER — Other Ambulatory Visit (HOSPITAL_COMMUNITY)

## 2023-09-09 DIAGNOSIS — F1010 Alcohol abuse, uncomplicated: Secondary | ICD-10-CM | POA: Diagnosis not present

## 2023-09-09 DIAGNOSIS — I4821 Permanent atrial fibrillation: Secondary | ICD-10-CM | POA: Diagnosis not present

## 2023-09-09 DIAGNOSIS — I5033 Acute on chronic diastolic (congestive) heart failure: Secondary | ICD-10-CM | POA: Diagnosis not present

## 2023-09-09 DIAGNOSIS — D649 Anemia, unspecified: Secondary | ICD-10-CM | POA: Diagnosis not present

## 2023-09-09 LAB — BASIC METABOLIC PANEL WITH GFR
Anion gap: 8 (ref 5–15)
BUN: 22 mg/dL (ref 8–23)
CO2: 29 mmol/L (ref 22–32)
Calcium: 9.1 mg/dL (ref 8.9–10.3)
Chloride: 108 mmol/L (ref 98–111)
Creatinine, Ser: 1.37 mg/dL — ABNORMAL HIGH (ref 0.61–1.24)
GFR, Estimated: 55 mL/min — ABNORMAL LOW (ref >60)
Glucose, Bld: 110 mg/dL — ABNORMAL HIGH (ref 70–99)
Potassium: 4 mmol/L (ref 3.5–5.1)
Sodium: 145 mmol/L (ref 135–145)

## 2023-09-09 MED ORDER — METOPROLOL TARTRATE 25 MG PO TABS
12.5000 mg | ORAL_TABLET | Freq: Two times a day (BID) | ORAL | Status: DC
  Administered 2023-09-10 – 2023-09-28 (×27): 12.5 mg via ORAL
  Filled 2023-09-09 (×36): qty 1

## 2023-09-09 MED ORDER — PANTOPRAZOLE SODIUM 40 MG PO TBEC
40.0000 mg | DELAYED_RELEASE_TABLET | Freq: Every day | ORAL | Status: DC
  Administered 2023-09-09 – 2023-09-28 (×19): 40 mg via ORAL
  Filled 2023-09-09 (×20): qty 1

## 2023-09-09 MED ORDER — MEDIHONEY WOUND/BURN DRESSING EX PSTE
1.0000 | PASTE | Freq: Every day | CUTANEOUS | Status: DC
  Administered 2023-09-09 – 2023-09-28 (×19): 1 via TOPICAL
  Filled 2023-09-09 (×6): qty 44

## 2023-09-09 NOTE — Evaluation (Signed)
 Physical Therapy Evaluation Patient Details Name: Frederick Brown MRN: [id number] DOB: [birth year] Today's Date: 09/09/2023  History of Present Illness  Frederick Brown is a 73 y.o. male with medical history significant for diastolic CHF, alcohol abuse, atrial fibrillation, hypertension, gout, morbid obesity.  Patient presented to the ED with complaints of generalized weakness, and increased difficulty breathing with exertion with bilateral lower extremity swelling for the past 3 weeks, with abdominal distention.  He supposed to be on Lasix  40 mg TID but has not been compliant.  His alcohol daily-about 2 glasses of vodka daily.  He lives alone and has had difficulty ambulating (due to weakness, dyspnea and leg swelling) to get his alcohol beverages hence he has not had any in the past 3 to 4 days.  Denies hallucinations or shakes. He denies history of alcohol withdrawal.   Clinical Impression  Patient demonstrates slow labored movement for sitting up at bedside requiring use of bed rails and HOB bed raised. Patient on room air with SpO2 dropping from 93% to 96% and put back on 2 LPM O2.  Patient declined to attempt sit to stands, transfers due to c/o fatigue and apprehension. Patient tolerated sitting up at bedside after therapy - nursing staff notified. Patient will benefit from continued skilled physical therapy in hospital and recommended venue below to increase strength, balance, endurance for safe ADLs and gait.           If plan is discharge home, recommend the following: A lot of help with bathing/dressing/bathroom;A lot of help with walking and/or transfers;Help with stairs or ramp for entrance;Assistance with cooking/housework   Can travel by private vehicle   No    Equipment Recommendations None recommended by PT  Recommendations for Other Services       Functional Status Assessment Patient has had a recent decline in their functional status and demonstrates the ability to make  significant improvements in function in a reasonable and predictable amount of time.     Precautions / Restrictions Precautions Precautions: Fall Recall of Precautions/Restrictions: Intact Restrictions Weight Bearing Restrictions Per Provider Order: No      Mobility  Bed Mobility Overal bed mobility: Needs Assistance Bed Mobility: Supine to Sit     Supine to sit: Min assist     General bed mobility comments: increased time requiring use of bed rail with HOB raised    Transfers                        Ambulation/Gait                  Stairs            Wheelchair Mobility     Tilt Bed    Modified Rankin (Stroke Patients Only)       Balance Overall balance assessment: Needs assistance Sitting-balance support: Feet supported, No upper extremity supported Sitting balance-Leahy Scale: Good Sitting balance - Comments: seated at EOB                                     Pertinent Vitals/Pain Pain Assessment Pain Assessment: No/denies pain    Home Living Family/patient expects to be discharged to:: Private residence Living Arrangements: Alone Available Help at Discharge: Neighbor;Available PRN/intermittently Type of Home: House Home Access: Ramped entrance       Home Layout: One level Home Equipment: Rollator (4 wheels);Wheelchair -  manual      Prior Function Prior Level of Function : Needs assist       Physical Assist : Mobility (physical);ADLs (physical) Mobility (physical): Bed mobility;Transfers;Gait;Stairs   Mobility Comments: household ambulator using Rollator, does not drive ADLs Comments: Independent for most household ADLs, neighbor assist PRN, has groceris, food delivered to home     Extremity/Trunk Assessment   Upper Extremity Assessment Upper Extremity Assessment: Defer to OT evaluation    Lower Extremity Assessment Lower Extremity Assessment: Generalized weakness    Cervical / Trunk  Assessment Cervical / Trunk Assessment: Normal  Communication   Communication Communication: No apparent difficulties    Cognition Arousal: Alert Behavior During Therapy: WFL for tasks assessed/performed   PT - Cognitive impairments: No apparent impairments                         Following commands: Intact       Cueing Cueing Techniques: Verbal cues, Tactile cues     General Comments      Exercises     Assessment/Plan    PT Assessment Patient needs continued PT services  PT Problem List Decreased strength;Decreased activity tolerance;Decreased balance;Decreased mobility       PT Treatment Interventions DME instruction;Gait training;Stair training;Functional mobility training;Therapeutic activities;Therapeutic exercise;Balance training;Patient/family education    PT Goals (Current goals can be found in the Care Plan section)  Acute Rehab PT Goals Patient Stated Goal: return home with neighbor, friends to assist PT Goal Formulation: With patient Time For Goal Achievement: 09/23/23 Potential to Achieve Goals: Good    Frequency Min 3X/week     Co-evaluation               AM-PAC PT "6 Clicks" Mobility  Outcome Measure Help needed turning from your back to your side while in a flat bed without using bedrails?: A Little Help needed moving from lying on your back to sitting on the side of a flat bed without using bedrails?: A Little Help needed moving to and from a bed to a chair (including a wheelchair)?: Total Help needed standing up from a chair using your arms (e.g., wheelchair or bedside chair)?: Total Help needed to walk in hospital room?: Total Help needed climbing 3-5 steps with a railing? : Total 6 Click Score: 10    End of Session Equipment Utilized During Treatment: Oxygen Activity Tolerance: Patient tolerated treatment well;Patient limited by fatigue Patient left: in bed;with call bell/phone within reach;Other (comment) (Patent left  seated at EOB) Nurse Communication: Mobility status PT Visit Diagnosis: Unsteadiness on feet (R26.81);Other abnormalities of gait and mobility (R26.89);Muscle weakness (generalized) (M62.81)    Time: 1010-1030 PT Time Calculation (min) (ACUTE ONLY): 20 min   Charges:   PT Evaluation $PT Eval Moderate Complexity: 1 Mod PT Treatments $Therapeutic Activity: 8-22 mins PT General Charges $$ ACUTE PT VISIT: 1 Visit         3:14 PM, 09/09/23 Walton Guppy, MPT Physical Therapist with [facility] [phone] office [phone] mobile phone

## 2023-09-09 NOTE — Plan of Care (Signed)
  Problem: Acute Rehab PT Goals(only PT should resolve) Goal: Pt Will Go Supine/Side To Sit Outcome: Progressing Flowsheets (Taken 09/09/2023 1515) Pt will go Supine/Side to Sit:  with contact guard assist  with supervision Goal: Patient Will Transfer Sit To/From Stand Outcome: Progressing Flowsheets (Taken 09/09/2023 1515) Patient will transfer sit to/from stand:  with supervision  with contact guard assist Goal: Pt Will Transfer Bed To Chair/Chair To Bed Outcome: Progressing Flowsheets (Taken 09/09/2023 1515) Pt will Transfer Bed to Chair/Chair to Bed:  with contact guard assist  with min assist Goal: Pt Will Ambulate Outcome: Progressing Flowsheets (Taken 09/09/2023 1515) Pt will Ambulate:  25 feet  with minimal assist  with rolling walker   3:16 PM, 09/09/23 Walton Guppy, MPT Physical Therapist with [facility] [phone] office [phone] mobile phone

## 2023-09-09 NOTE — Consult Note (Signed)
 WOC Nurse Consult Note: Reason for Consult:R thigh wound  Wound type:Stage 3 Pressure Injury R ischium  Pressure Injury POA: Yes Measurement: see nursing flowsheet  Wound bed: 50% pink 50% yellow  Drainage (amount, consistency, odor) see nursing flowsheet  Periwound: erythema  Dressing procedure/placement/frequency: Cleanse R ischial wound with NS, apply Medihoney to wound bed daily, cover with ry gauze and silicone foam to secure.   POC discussed with bedside nurse. WOC team will not follow. Re-consult if further needs arise.   Thank you,    Ronni Colace MSN, RN-BC, [employer]

## 2023-09-09 NOTE — Progress Notes (Signed)
 [facility] PROGRESS NOTE   Frederick Brown ZOX:[id number] DOB: [birth year] DOA: 09/08/2023  PCP: Minus Amel, MD  Brief History: 73 y.o. male with medical history significant for diastolic CHF, alcohol abuse, atrial fibrillation, hypertension, gout, morbid obesity.  Presented with generalized weakness, shortness of breath and worsening leg swelling.  He had not been compliant with his furosemide  at home.  Drinks about 2 glasses of vodka on a daily basis.  He was hospitalized for further management of volume overload.  Consultants: None yet  Procedures: Echocardiogram is pending    Subjective/Interval History: Patient mentions that his shortness of breath is about the same.  Denies any chest pain.  No nausea or vomiting.  Complains of leg swelling.  Does not use oxygen at home.    Assessment/Plan:  Acute on chronic diastolic CHF/acute respiratory failure with hypoxia Echocardiogram has been ordered and is pending.  Last echo from 2018 showed LVEF of 55%. Patient is on IV furosemide  which will be continued. Strict ins and outs and daily weights. Noted to be on oxygen by nasal cannula at 2 to 3 L/min.  Saturations are in the mid 90s. His morbid obesity is also contributing to his symptoms.  Possibly has obesity hypoventilation syndrome.  Might have sleep apnea as well.  This will need further workup in the outpatient setting.  Alcohol abuse Counseled.  He states that he has only 2 drinks every evening while cooking his meal.  Continue CIWA protocol thiamine  folate multivitamins.  No evidence for withdrawal currently.  Persistent atrial fibrillation Noted to be on metoprolol .  Borderline low blood pressures noted.  Will hold metoprolol  for today and decrease the dose from tomorrow. Anticoagulated with Xarelto .  Low hemoglobin noted.  Normocytic anemia Hemoglobin of 7.6 noted.  No bleeding episodes.  Patient denies any black stools.  Stool was Hemoccult negative.  Patient  mentioned that he had a colonoscopy within the last 2 to 3 years and which was unremarkable. Will check anemia panel.  Monitor hemoglobin closely.  Noted that he is on anticoagulation. Last hemoglobin in our system is from February 2024 when it was 11.0. Continue PPI. May need further workup for anemia.  However currently he is not a candidate for endoscopic evaluation due to his dyspnea.  Chronic kidney disease stage IIIa Renal function is stable.  Avoid nephrotoxic agents.  Essential hypertension Monitor blood pressures closely.  History of gout Continue allopurinol .  Morbid obesity Estimated body mass index is 58.01 kg/m as calculated from the following:   Height as of this encounter: 5\' 10"  (1.778 m).   Weight as of this encounter: 183.4 kg.   DVT Prophylaxis: On rivaroxaban  Code Status: Full code Family Communication: Discussed with patient Disposition Plan: PT and OT eval.  Hopefully return home when improved.  He will does live by himself.  Status is: Inpatient Remains inpatient appropriate because: Concern for CHF      Medications: Scheduled:  allopurinol   300 mg Oral Daily   folic acid   1 mg Oral Daily   furosemide   40 mg Intravenous BID   leptospermum manuka honey  1 Application Topical Daily   melatonin  3 mg Oral QHS   [START ON 09/10/2023] metoprolol  tartrate  12.5 mg Oral BID   multivitamin with minerals  1 tablet Oral Daily   [START ON 09/10/2023] pantoprazole   40 mg Oral Once per day on Monday Wednesday Friday   rivaroxaban   20 mg Oral Daily   rosuvastatin   20 mg Oral Daily  thiamine   100 mg Oral Daily   Or   thiamine   100 mg Intravenous Daily   Continuous: PRN:acetaminophen  **OR** acetaminophen , LORazepam  **OR** LORazepam , ondansetron  **OR** ondansetron  (ZOFRAN ) IV, polyethylene glycol  Antibiotics: Anti-infectives (From admission, onward)    None       Objective:  Vital Signs  Vitals:   09/09/23 0031 09/09/23 0503 09/09/23 0520  09/09/23 0852  BP: (!) 95/54 (!) 103/45 (!) 106/57 (!) 101/35  Pulse: 68 65  65  Resp: 20 20    Temp: 98.6 F (37 C) 98 F (36.7 C)  98.6 F (37 C)  TempSrc: Oral Oral  Oral  SpO2: 92% 92%  94%  Weight:  (!) 183.4 kg    Height:        Intake/Output Summary (Last 24 hours) at 09/09/2023 0931 Last data filed at 09/09/2023 0500 Gross per 24 hour  Intake 240 ml  Output 900 ml  Net -660 ml   Filed Weights   09/08/23 1626 09/08/23 2117 09/09/23 0503  Weight: (!) 185 kg (!) 183.4 kg (!) 183.4 kg    General appearance: Awake alert.  In no distress Resp: Mildly tachypneic at rest.  Normal use of accessory muscles.  Crackles at the bases.  No wheezing or rhonchi. Cardio: S1-S2 is normal regular.  No S3-S4.  No rubs murmurs or bruit GI: Abdomen is soft.  Nontender nondistended.  Bowel sounds are present normal.  No masses organomegaly Extremities: 2+ pitting edema bilateral lower extremities.  Physical deconditioning noted. Neurologic: Alert and oriented x3.  No focal neurological deficits.    Lab Results:  Data Reviewed: I have personally reviewed following labs and reports of the imaging studies  CBC: Recent Labs  Lab 09/08/23 1719  WBC 6.9  NEUTROABS 5.3  HGB 7.6*  HCT 29.6*  MCV 83.4  PLT 300    Basic Metabolic Panel: Recent Labs  Lab 09/08/23 1719 09/09/23 0405  NA 143 145  K 3.7 4.0  CL 107 108  CO2 28 29  GLUCOSE 106* 110*  BUN 21 22  CREATININE 1.40* 1.37*  CALCIUM  9.4 9.1  MG 2.4  --     GFR: Estimated Creatinine Clearance: 80.8 mL/min (A) (by C-G formula based on SCr of 1.37 mg/dL (H)).  Liver Function Tests: Recent Labs  Lab 09/08/23 1719  AST 21  ALT 9  ALKPHOS 62  BILITOT 1.0  PROT 6.0*  ALBUMIN 3.0*     Radiology Studies: DG Chest Port 1 View Result Date: 09/08/2023 CLINICAL DATA:  sob EXAM: PORTABLE CHEST - 1 VIEW COMPARISON:  None available. FINDINGS: Diffuse bilateral perihilar interstitial opacities. Blunting of the right  costophrenic angle with hazy airspace attenuation, likely reflecting a small effusion and atelectasis. Trace left pleural effusion. Cardiomegaly. Tortuous aorta with aortic atherosclerosis. No acute fracture or destructive lesions. Multilevel thoracic osteophytosis. IMPRESSION: Cardiomegaly with findings of interstitial edema. Small right and trace left pleural effusions. Electronically Signed   By: Rance Burrows M.D.   On: 09/08/2023 18:02       LOS: 1 day   Story Conti  [facility] Pager on [email or web address]  09/09/2023, 9:31 AM

## 2023-09-09 NOTE — TOC Progression Note (Signed)
 Transition of Care [facility]) - Progression Note    Patient Details  Name: Frederick Brown MRN: [id number] Date of Birth: 08/03/1950  [facility] CM/SW Contact  Katrinka Parr, [place] Phone Number: 09/09/2023, 11:42 AM  Clinical Narrative:     CSW met with pt bedside to discuss SNF recommendation. Pt wants to go home with [facility] instead of SNF. States he lives at home alone. He is normally able to get around in his home using an electric lift chair and a bariatric wheelchair. He feels he will be able to manage at home once his fluid buildup improves. Pt is agreeable to [facility] services; no preference. Does not wear oxygen at home.  HH PT/OT arranged with [facility].   TOC also consulted for substance use. Pt endorses drinking 2 alcoholic drinks/night with dinner. Does not feel he has an issue with drinking. States he has not had a drink in 5 days and has had other extended periods of abstinence. He does not feel he requires resources for this .   Expected Discharge Plan: Home w Home Health Services Barriers to Discharge: Continued Medical Work up  Expected Discharge Plan and Services       Living arrangements for the past 2 months: Single Family Home                                       Social Determinants of Health (SDOH) Interventions SDOH Screenings   Food Insecurity: Food Insecurity Present (09/08/2023)  Housing: Low Risk  (09/08/2023)  Transportation Needs: No Transportation Needs (09/08/2023)  Utilities: Not At Risk (09/08/2023)  Social Connections: Socially Isolated (09/08/2023)  Tobacco Use: Medium Risk (09/08/2023)    Readmission Risk Interventions     No data to display

## 2023-09-09 NOTE — Plan of Care (Signed)

## 2023-09-10 ENCOUNTER — Inpatient Hospital Stay (HOSPITAL_COMMUNITY)

## 2023-09-10 DIAGNOSIS — I509 Heart failure, unspecified: Secondary | ICD-10-CM | POA: Diagnosis not present

## 2023-09-10 DIAGNOSIS — I5033 Acute on chronic diastolic (congestive) heart failure: Secondary | ICD-10-CM | POA: Diagnosis not present

## 2023-09-10 DIAGNOSIS — D649 Anemia, unspecified: Secondary | ICD-10-CM | POA: Diagnosis not present

## 2023-09-10 LAB — RETICULOCYTES
Immature Retic Fract: 28 % — ABNORMAL HIGH (ref 2.3–15.9)
RBC.: 3.44 MIL/uL — ABNORMAL LOW (ref 4.22–5.81)
Retic Count, Absolute: 82.9 10*3/uL (ref 19.0–186.0)
Retic Ct Pct: 2.4 % (ref 0.4–3.1)

## 2023-09-10 LAB — CBC
HCT: 29 % — ABNORMAL LOW (ref 39.0–52.0)
Hemoglobin: 7.2 g/dL — ABNORMAL LOW (ref 13.0–17.0)
MCH: 20.9 pg — ABNORMAL LOW (ref 26.0–34.0)
MCHC: 24.8 g/dL — ABNORMAL LOW (ref 30.0–36.0)
MCV: 84.1 fL (ref 80.0–100.0)
Platelets: 260 10*3/uL (ref 150–400)
RBC: 3.45 MIL/uL — ABNORMAL LOW (ref 4.22–5.81)
RDW: 21.5 % — ABNORMAL HIGH (ref 11.5–15.5)
WBC: 8.2 10*3/uL (ref 4.0–10.5)
nRBC: 0.2 % (ref 0.0–0.2)

## 2023-09-10 LAB — ECHOCARDIOGRAM COMPLETE
Height: 70 in
S' Lateral: 4.3 cm
Weight: 6486.82 [oz_av]

## 2023-09-10 LAB — BASIC METABOLIC PANEL WITH GFR
Anion gap: 10 (ref 5–15)
BUN: 22 mg/dL (ref 8–23)
CO2: 28 mmol/L (ref 22–32)
Calcium: 9.5 mg/dL (ref 8.9–10.3)
Chloride: 106 mmol/L (ref 98–111)
Creatinine, Ser: 1.26 mg/dL — ABNORMAL HIGH (ref 0.61–1.24)
GFR, Estimated: >60 mL/min (ref >60)
Glucose, Bld: 120 mg/dL — ABNORMAL HIGH (ref 70–99)
Potassium: 3.8 mmol/L (ref 3.5–5.1)
Sodium: 144 mmol/L (ref 135–145)

## 2023-09-10 LAB — FERRITIN: Ferritin: 5 ng/mL — ABNORMAL LOW (ref 24–336)

## 2023-09-10 LAB — VITAMIN B12: Vitamin B-12: 395 pg/mL (ref 180–914)

## 2023-09-10 LAB — IRON AND TIBC
Iron: 16 ug/dL — ABNORMAL LOW (ref 45–182)
Saturation Ratios: 4 % — ABNORMAL LOW (ref 17.9–39.5)
TIBC: 449 ug/dL (ref 250–450)
UIBC: 433 ug/dL

## 2023-09-10 LAB — TSH: TSH: 0.505 u[IU]/mL (ref 0.350–4.500)

## 2023-09-10 LAB — FOLATE: Folate: 28.1 ng/mL (ref >5.9)

## 2023-09-10 MED ORDER — SODIUM CHLORIDE 0.9 % IV SOLN
200.0000 mg | Freq: Once | INTRAVENOUS | Status: AC
  Administered 2023-09-10: 200 mg via INTRAVENOUS
  Filled 2023-09-10: qty 10

## 2023-09-10 NOTE — Progress Notes (Signed)
  Progress Note   Patient: Frederick Brown NWG:[id number] DOB: 1950-10-28 DOA: 09/08/2023     2 DOS: the patient was seen and examined on 09/10/2023   Brief hospital course: 73 y.o. male with medical history significant for diastolic CHF, alcohol abuse, atrial fibrillation, hypertension, gout, morbid obesity.  Presented with generalized weakness, shortness of breath and worsening leg swelling.  He had not been compliant with his furosemide  at home.  Drinks about 2 glasses of vodka on a daily basis.  He was hospitalized for further management of volume overload.   Assessment and Plan: Acute on chronic diastolic CHF/acute respiratory failure with hypoxia Pt admitted to noncomliance with diuretic  Last echo from 2018 showed LVEF of 55%. Repeat echo pending Patient is on IV furosemide  which will be continued. Strict ins and outs and daily weights. His morbid obesity is also contributing to his symptoms.  Possibly has obesity hypoventilation syndrome.  Might have sleep apnea as well.  This will need further workup in the outpatient setting.   Alcohol abuse Counseled.  He states that he has only 2 drinks every evening while cooking his meal.  Continue CIWA protocol thiamine  folate multivitamins.  No evidence for withdrawal currently.   Persistent atrial fibrillation Noted to be on metoprolol .  Borderline low blood pressures noted.  Will hold metoprolol  for today and decrease the dose from tomorrow. Anticoagulated with Xarelto .  Low hemoglobin noted.   Normocytic anemia Hemoglobin of 7.6 noted.  No bleeding episodes.  Patient denies any black stools.  Stool was Hemoccult negative.  Patient mentioned to Dr. Lyndon Santiago that he had a colonoscopy within the last 2 to 3 years and which was unremarkable. -On chart review, noted colonoscopy in 2009 with 3 tubular adenomas  Iron level is low at 16 with sat of 4%. Will order IV iron   Chronic kidney disease stage IIIa Renal function is stable.  Avoid  nephrotoxic agents.   Essential hypertension Monitor blood pressures closely.   History of gout Continue allopurinol .   Morbid obesity Estimated body mass index is 58.01 kg/m as calculated from the following:   Height as of this encounter: 5' 10 (1.778 m).   Weight as of this encounter: 183.4 kg.      Subjective: Voiding very well. Still sob and coughing  Physical Exam: Vitals:   09/09/23 2024 09/10/23 0438 09/10/23 0800 09/10/23 1324  BP: (!) 105/47 (!) 101/45 (!) 129/58 (!) 107/50  Pulse: (!) 53 (!) 55 84 74  Resp: 19 18 20 18   Temp: 98 F (36.7 C) 97.8 F (36.6 C) 97.6 F (36.4 C) 98.2 F (36.8 C)  TempSrc: Oral Oral Axillary Oral  SpO2: 93% 92% 92% 96%  Weight:  (!) 183.9 kg    Height:       General exam: Awake, laying in bed, in nad Respiratory system: Normal respiratory effort, no wheezing Cardiovascular system: regular rate, s1, s2 Gastrointestinal system: Soft, nondistended, positive BS Central nervous system: CN2-12 grossly intact, strength intact Extremities: Perfused, no clubbing Skin: Normal skin turgor, no notable skin lesions seen Psychiatry: Mood normal // no visual hallucinations   Data Reviewed:  Labs reviewed: Na 144, K 3.8, Cr 1.26, Fe 16, WBC 8.2, Hgb 7.2, Plts 260  Family Communication: Pt in room, family not at bedside  Disposition: Status is: Inpatient Remains inpatient appropriate because: severity of illness  Planned Discharge Destination: Home     Author: Cherylle Corwin, MD 09/10/2023 2:03 PM  For on call review [email or web address].

## 2023-09-10 NOTE — Progress Notes (Signed)
 Physical Therapy Treatment Patient Details Name: Frederick Brown MRN: [id number] DOB: [birth year] Today's Date: 09/10/2023   History of Present Illness Frederick Brown is a 73 y.o. male with medical history significant for diastolic CHF, alcohol abuse, atrial fibrillation, hypertension, gout, morbid obesity.  Patient presented to the ED with complaints of generalized weakness, and increased difficulty breathing with exertion with bilateral lower extremity swelling for the past 3 weeks, with abdominal distention.  He supposed to be on Lasix  40 mg TID but has not been compliant.  His alcohol daily-about 2 glasses of vodka daily.  He lives alone and has had difficulty ambulating (due to weakness, dyspnea and leg swelling) to get his alcohol beverages hence he has not had any in the past 3 to 4 days.  Denies hallucinations or shakes. He denies history of alcohol withdrawal.    PT Comments  Patient had to use bed rails with HOB elevated for sitting up at bedside, required repeated attempts and long rest breaks before able to come to complete standing at bedside and unable to take steps or transfer to chair due to c/o weakness and fear of falling. Patient SpO2 dropped when attempting activities on room air and required 2 LPM to keep SpO2 over 90%.  Patient tolerated sitting up at bedside after therapy - nursing staff aware. Patient will benefit from continued skilled physical therapy in hospital and recommended venue below to increase strength, balance, endurance for safe ADLs and gait.    If plan is discharge home, recommend the following: A lot of help with bathing/dressing/bathroom;A lot of help with walking and/or transfers;Help with stairs or ramp for entrance;Assistance with cooking/housework   Can travel by private vehicle     No  Equipment Recommendations  None recommended by PT    Recommendations for Other Services       Precautions / Restrictions Precautions Precautions: Fall Recall of  Precautions/Restrictions: Intact Restrictions Weight Bearing Restrictions Per Provider Order: No     Mobility  Bed Mobility Overal bed mobility: Needs Assistance Bed Mobility: Supine to Sit, Sit to Supine     Supine to sit: Mod assist Sit to supine: Mod assist, Max assist   General bed mobility comments: increased time requiring use of bed rail with HOB raised    Transfers Overall transfer level: Needs assistance Equipment used: Rolling walker (2 wheels) Transfers: Sit to/from Stand Sit to Stand: Max assist, From elevated surface           General transfer comment: Patient required repeated attemtps and bed elevated  in order to complete sit to stand at bedside    Ambulation/Gait                   Stairs             Wheelchair Mobility     Tilt Bed    Modified Rankin (Stroke Patients Only)       Balance Overall balance assessment: Needs assistance Sitting-balance support: Feet supported, No upper extremity supported Sitting balance-Leahy Scale: Fair Sitting balance - Comments: fair/good seated at EOB   Standing balance support: Reliant on assistive device for balance, During functional activity, Bilateral upper extremity supported Standing balance-Leahy Scale: Poor Standing balance comment: using RW                            Communication Communication Communication: No apparent difficulties  Cognition Arousal: Alert Behavior During Therapy: WFL for tasks assessed/performed,  Anxious   PT - Cognitive impairments: No apparent impairments                         Following commands: Intact      Cueing Cueing Techniques: Verbal cues, Tactile cues  Exercises      General Comments        Pertinent Vitals/Pain Pain Assessment Pain Assessment: Faces Faces Pain Scale: Hurts a little bit Pain Location: general pain all over, per patient Pain Descriptors / Indicators: Discomfort, Sore Pain Intervention(s):  Limited activity within patient's tolerance, Monitored during session, Repositioned    Home Living                          Prior Function            PT Goals (current goals can now be found in the care plan section) Acute Rehab PT Goals Patient Stated Goal: return home with neighbor, friends to assist PT Goal Formulation: With patient Time For Goal Achievement: 09/23/23 Potential to Achieve Goals: Good Progress towards PT goals: Progressing toward goals    Frequency    Min 3X/week      PT Plan      Co-evaluation PT/OT/SLP Co-Evaluation/Treatment: Yes Reason for Co-Treatment: To address functional/ADL transfers PT goals addressed during session: Mobility/safety with mobility;Balance;Proper use of DME        AM-PAC PT 6 Clicks Mobility   Outcome Measure  Help needed turning from your back to your side while in a flat bed without using bedrails?: A Little Help needed moving from lying on your back to sitting on the side of a flat bed without using bedrails?: A Little Help needed moving to and from a bed to a chair (including a wheelchair)?: Total Help needed standing up from a chair using your arms (e.g., wheelchair or bedside chair)?: A Lot Help needed to walk in hospital room?: Total Help needed climbing 3-5 steps with a railing? : Total 6 Click Score: 11    End of Session Equipment Utilized During Treatment: Oxygen Activity Tolerance: Patient tolerated treatment well;Patient limited by fatigue Patient left: in bed;with call bell/phone within reach;Other (comment) Nurse Communication: Mobility status PT Visit Diagnosis: Unsteadiness on feet (R26.81);Other abnormalities of gait and mobility (R26.89);Muscle weakness (generalized) (M62.81)     Time: 0981-1914 PT Time Calculation (min) (ACUTE ONLY): 32 min  Charges:    $Therapeutic Activity: 23-37 mins PT General Charges $$ ACUTE PT VISIT: 1 Visit                     2:12 PM, 09/10/23 Walton Guppy, MPT Physical Therapist with [facility] [phone] office [phone] mobile phone

## 2023-09-10 NOTE — Progress Notes (Signed)
 This RN attests to all student documentation.   Zyden Suman V. Trevyon Swor, MSN-RN Clinical Instructor/Faculty [employer] Program [facility]

## 2023-09-10 NOTE — Hospital Course (Addendum)
 73 y.o. male with medical history significant for diastolic CHF, alcohol abuse, atrial fibrillation, hypertension, gout, morbid obesity.  Presented with generalized weakness, shortness of breath and worsening leg swelling.  He had not been compliant with his furosemide  at home.  Drinks about 2 glasses of vodka on a daily basis.  He was hospitalized for further management of volume overload.  There's been no signs of alcohol withdrawal.  Hospitalization has been prolonged due to massive fluid overload requiring IV lasix  continuous infustion.  Cardiology was consulted.  Metolazone  was given intermittently.  He diuresed 39L.  He improved clinically.  He was transitioned to torsemide 80 mg bid with daily KCl supplementation. His discharge was delayed due to logistics regarding transport to SNF. He remained stable for d/c on 09/28/23

## 2023-09-10 NOTE — Evaluation (Signed)
 Occupational Therapy Evaluation Patient Details Name: Frederick Brown MRN: [id number] DOB: Mar 01, 1951 Today's Date: 09/10/2023   History of Present Illness   Frederick Brown is a 73 y.o. male with medical history significant for diastolic CHF, alcohol abuse, atrial fibrillation, hypertension, gout, morbid obesity.  Patient presented to the ED with complaints of generalized weakness, and increased difficulty breathing with exertion with bilateral lower extremity swelling for the past 3 weeks, with abdominal distention.  He supposed to be on Lasix  40 mg TID but has not been compliant.  His alcohol daily-about 2 glasses of vodka daily.  He lives alone and has had difficulty ambulating (due to weakness, dyspnea and leg swelling) to get his alcohol beverages hence he has not had any in the past 3 to 4 days.  Denies hallucinations or shakes. He denies history of alcohol withdrawal.     Clinical Impressions Pt agreeable to OT and PT co-evaluation/treatment. Pt noted to desaturate to 84% SpO2 without 2 L of supplemental O2. Pt placed back on supplemental O2 for the remainder of the session with frequent rest breaks needed for the pt to catch his breath. Pt required mod to max A for bed mobility and max/total +2 assist for partial sit to stand from an elevated EOB ~1/4 attempts. Pt generally weak with several rest breaks needed today. Pt left seated at the EOB with call bell within reach. Pt will benefit from continued OT in the hospital and recommended venue below to increase strength, balance, and endurance for safe ADL's.        If plan is discharge home, recommend the following:   Two people to help with walking and/or transfers;A lot of help with bathing/dressing/bathroom;Assistance with cooking/housework;Assist for transportation;Help with stairs or ramp for entrance     Functional Status Assessment   Patient has had a recent decline in their functional status and demonstrates the ability to  make significant improvements in function in a reasonable and predictable amount of time.     Equipment Recommendations   None recommended by OT             Precautions/Restrictions   Precautions Precautions: Fall Restrictions Weight Bearing Restrictions Per Provider Order: No     Mobility Bed Mobility Overal bed mobility: Needs Assistance Bed Mobility: Supine to Sit, Sit to Supine     Supine to sit: Mod assist Sit to supine: Mod assist, Max assist   General bed mobility comments: increased time requiring use of bed rail with HOB raised    Transfers Overall transfer level: Needs assistance Equipment used: Rolling walker (2 wheels)               General transfer comment: ~3 to 4 attempts with one partial stand with max to total +2 assist      Balance Overall balance assessment: Needs assistance Sitting-balance support: Feet supported, No upper extremity supported Sitting balance-Leahy Scale: Fair Sitting balance - Comments: seated at EOB                                   ADL either performed or assessed with clinical judgement   ADL Overall ADL's : Needs assistance/impaired     Grooming: Set up;Sitting   Upper Body Bathing: Set up;Sitting;Minimal assistance   Lower Body Bathing: Sitting/lateral leans;Maximal assistance;Moderate assistance   Upper Body Dressing : Set up;Sitting   Lower Body Dressing: Maximal assistance;Bed level Lower Body Dressing Details (  indicate cue type and reason): Assist to don shoes at EOB today. Toilet Transfer: Total assistance;+2 for physical assistance;Maximal assistance Toilet Transfer Details (indicate cue type and reason): Partially simulated via partial sit to stand from EOB with +2 assist. Toileting- Clothing Manipulation and Hygiene: Maximal assistance;Bed level;Sitting/lateral lean;Total assistance               Vision Baseline Vision/History: 1 Wears glasses Ability to See in Adequate  Light: 1 Impaired Patient Visual Report: No change from baseline Vision Assessment?: Wears glasses for reading (needs glasses to drive as well)     Perception Perception: Not tested       Praxis Praxis: Not tested       Pertinent Vitals/Pain Pain Assessment Pain Assessment: Faces Faces Pain Scale: Hurts a little bit Pain Location: general pain Pain Descriptors / Indicators: Discomfort Pain Intervention(s): Monitored during session, Repositioned     Extremity/Trunk Assessment Upper Extremity Assessment Upper Extremity Assessment: Generalized weakness   Lower Extremity Assessment Lower Extremity Assessment: Defer to PT evaluation   Cervical / Trunk Assessment Cervical / Trunk Assessment: Normal   Communication Communication Communication: No apparent difficulties   Cognition Arousal: Alert Behavior During Therapy: WFL for tasks assessed/performed Cognition: No apparent impairments                               Following commands: Intact       Cueing  General Comments   Cueing Techniques: Verbal cues;Tactile cues                 Home Living Family/patient expects to be discharged to:: Private residence Living Arrangements: Alone Available Help at Discharge: Neighbor;Available PRN/intermittently Type of Home: House Home Access: Ramped entrance     Home Layout: One level     Bathroom Shower/Tub: Sponge bathes at baseline;Walk-in shower   Bathroom Toilet: Handicapped height Bathroom Accessibility: Yes   Home Equipment: Rollator (4 wheels);Wheelchair - manual   Additional Comments: per chart      Prior Functioning/Environment Prior Level of Function : Needs assist       Physical Assist : ADLs (physical)   ADLs (physical): IADLs Mobility Comments: household ambulator using Rollator, does not drive (per chart) ADLs Comments: Independent for most household ADLs, neighbor assist PRN, has groceris, food delivered to home (per chart  and pt report)    OT Problem List: Decreased strength;Decreased activity tolerance;Impaired balance (sitting and/or standing);Obesity;Increased edema;Cardiopulmonary status limiting activity   OT Treatment/Interventions: Self-care/ADL training;Therapeutic exercise;DME and/or AE instruction;Therapeutic activities;Patient/family education;Balance training;Energy conservation      OT Goals(Current goals can be found in the care plan section)   Acute Rehab OT Goals Patient Stated Goal: return home OT Goal Formulation: With patient Time For Goal Achievement: 09/24/23 Potential to Achieve Goals: Good   OT Frequency:  Min 2X/week    Co-evaluation PT/OT/SLP Co-Evaluation/Treatment: Yes Reason for Co-Treatment: To address functional/ADL transfers   OT goals addressed during session: ADL's and self-care                       End of Session Equipment Utilized During Treatment: Rolling walker (2 wheels);Gait belt;Oxygen  Activity Tolerance: Patient tolerated treatment well Patient left: in bed;with call bell/phone within reach  OT Visit Diagnosis: Unsteadiness on feet (R26.81);Other abnormalities of gait and mobility (R26.89);Muscle weakness (generalized) (M62.81)                Time: 1610-9604 OT  Time Calculation (min): 41 min Charges:  OT General Charges $OT Visit: 1 Visit OT Evaluation $OT Eval Moderate Complexity: 1 Mod  Shalaunda Weatherholtz OT, MOT  Thurnell Floss 09/10/2023, 10:30 AM

## 2023-09-10 NOTE — Plan of Care (Signed)
  Problem: Acute Rehab OT Goals (only OT should resolve) Goal: Pt. Will Perform Upper Body Dressing Flowsheets (Taken 09/10/2023 1037) Pt Will Perform Upper Body Dressing: with modified independence Goal: Pt. Will Perform Lower Body Dressing Flowsheets (Taken 09/10/2023 1037) Pt Will Perform Lower Body Dressing:  with modified independence  sitting/lateral leans Goal: Pt. Will Transfer To Toilet Flowsheets (Taken 09/10/2023 1037) Pt Will Transfer to Toilet:  with min assist  stand pivot transfer  with mod assist Goal: Pt. Will Perform Toileting-Clothing Manipulation Flowsheets (Taken 09/10/2023 1037) Pt Will Perform Toileting - Clothing Manipulation and hygiene:  sitting/lateral leans  with contact guard assist Goal: Pt/Caregiver Will Perform Home Exercise Program Flowsheets (Taken 09/10/2023 1037) Pt/caregiver will Perform Home Exercise Program:  Increased strength  Both right and left upper extremity  Independently  Shaylee Stanislawski OT, MOT

## 2023-09-10 NOTE — Progress Notes (Signed)
  Echocardiogram 2D Echocardiogram has been performed.  Farley Honer, RDCS 09/10/2023, 2:23 PM

## 2023-09-11 DIAGNOSIS — D649 Anemia, unspecified: Secondary | ICD-10-CM | POA: Diagnosis not present

## 2023-09-11 DIAGNOSIS — I509 Heart failure, unspecified: Secondary | ICD-10-CM | POA: Diagnosis not present

## 2023-09-11 DIAGNOSIS — I5033 Acute on chronic diastolic (congestive) heart failure: Secondary | ICD-10-CM | POA: Diagnosis not present

## 2023-09-11 LAB — CBC
HCT: 30.2 % — ABNORMAL LOW (ref 39.0–52.0)
Hemoglobin: 7.6 g/dL — ABNORMAL LOW (ref 13.0–17.0)
MCH: 21.4 pg — ABNORMAL LOW (ref 26.0–34.0)
MCHC: 25.2 g/dL — ABNORMAL LOW (ref 30.0–36.0)
MCV: 85.1 fL (ref 80.0–100.0)
Platelets: 256 10*3/uL (ref 150–400)
RBC: 3.55 MIL/uL — ABNORMAL LOW (ref 4.22–5.81)
RDW: 21.2 % — ABNORMAL HIGH (ref 11.5–15.5)
WBC: 7.9 10*3/uL (ref 4.0–10.5)
nRBC: 0.6 % — ABNORMAL HIGH (ref 0.0–0.2)

## 2023-09-11 LAB — BASIC METABOLIC PANEL WITH GFR
Anion gap: 8 (ref 5–15)
BUN: 21 mg/dL (ref 8–23)
CO2: 30 mmol/L (ref 22–32)
Calcium: 9.4 mg/dL (ref 8.9–10.3)
Chloride: 105 mmol/L (ref 98–111)
Creatinine, Ser: 1.29 mg/dL — ABNORMAL HIGH (ref 0.61–1.24)
GFR, Estimated: 59 mL/min — ABNORMAL LOW (ref >60)
Glucose, Bld: 114 mg/dL — ABNORMAL HIGH (ref 70–99)
Potassium: 3.7 mmol/L (ref 3.5–5.1)
Sodium: 143 mmol/L (ref 135–145)

## 2023-09-11 MED ORDER — FUROSEMIDE 10 MG/ML IJ SOLN
60.0000 mg | Freq: Two times a day (BID) | INTRAMUSCULAR | Status: DC
  Administered 2023-09-11 – 2023-09-12 (×3): 60 mg via INTRAVENOUS
  Filled 2023-09-11 (×3): qty 6

## 2023-09-11 MED ORDER — GUAIFENESIN-DM 100-10 MG/5ML PO SYRP
5.0000 mL | ORAL_SOLUTION | ORAL | Status: DC | PRN
  Administered 2023-09-11 – 2023-09-18 (×6): 5 mL via ORAL
  Filled 2023-09-11 (×8): qty 5

## 2023-09-11 NOTE — Progress Notes (Signed)
  Progress Note   Patient: Frederick Brown ZOX:[id number] DOB: [birth year] DOA: 09/08/2023     3 DOS: the patient was seen and examined on 09/11/2023   Brief hospital course: 73 y.o. male with medical history significant for diastolic CHF, alcohol abuse, atrial fibrillation, hypertension, gout, morbid obesity.  Presented with generalized weakness, shortness of breath and worsening leg swelling.  He had not been compliant with his furosemide  at home.  Drinks about 2 glasses of vodka on a daily basis.  He was hospitalized for further management of volume overload.   Assessment and Plan: Acute on chronic diastolic CHF/acute respiratory failure with hypoxia Pt admitted to noncomliance with diuretic  Last echo from 2018 showed LVEF of 55%. Repeat echo with normal LVEF but with evidence of vol overload Strict ins and outs and daily weights. Continue BID IV lasix , increase to 60mg  Thus far net neg 2.3L Recheck bmet in AM   Alcohol abuse Counseled.  He states that he has only 2 drinks every evening while cooking his meal.  Continue CIWA protocol thiamine  folate multivitamins.  No evidence for withdrawal currently.   Persistent atrial fibrillation Noted to be on metoprolol .  Borderline low blood pressures noted.  Will hold metoprolol  for today and decrease the dose from tomorrow. Anticoagulated with Xarelto .  Low hemoglobin noted.   Normocytic anemia Hemoglobin of 7.6 noted.  No bleeding episodes.  Patient denies any black stools.  Stool was Hemoccult negative.  Patient mentioned to Dr. Lyndon Santiago that he had a colonoscopy within the last 2 to 3 years and which was unremarkable. -On chart review, noted colonoscopy in 2009 with 3 tubular adenomas  Iron level is low at 16 with sat of 4%. S/p dose of IV iron   Chronic kidney disease stage IIIa Renal function is stable.  Avoid nephrotoxic agents.   Essential hypertension Monitor blood pressures closely.   History of gout Continue allopurinol .    Morbid obesity Estimated body mass index is 58.01 kg/m as calculated from the following:   Height as of this encounter: 5' 10 (1.778 m).   Weight as of this encounter: 183.4 kg.      Subjective: Complains of coughing   Physical Exam: Vitals:   09/10/23 2105 09/10/23 2348 09/11/23 0604 09/11/23 1335  BP: (!) 115/48 (!) 112/50 (!) 123/56 103/61  Pulse: 66 75 96 79  Resp:  20 16   Temp:  97.8 F (36.6 C) 97.7 F (36.5 C) 100.1 F (37.8 C)  TempSrc:  Oral Oral Oral  SpO2:  94% 94% 93%  Weight:   (!) 181.8 kg   Height:       General exam: Conversant, in no acute distress Respiratory system: normal chest rise, clear, no audible wheezing Cardiovascular system: regular rhythm, s1-s2 Gastrointestinal system: Nondistended, nontender, pos BS Central nervous system: No seizures, no tremors Extremities: No cyanosis, no joint deformities Skin: No rashes, no pallor Psychiatry: Affect normal // no auditory hallucinations   Data Reviewed:  Labs reviewed: Na 143, K 3.7, Cr 1.29, WBC 7.9, Hgb 7.6, Plts 256  Family Communication: Pt in room, family not at bedside  Disposition: Status is: Inpatient Remains inpatient appropriate because: severity of illness  Planned Discharge Destination: Home     Author: Cherylle Corwin, MD 09/11/2023 3:49 PM  For on call review [email or web address].

## 2023-09-12 DIAGNOSIS — I5032 Chronic diastolic (congestive) heart failure: Secondary | ICD-10-CM | POA: Diagnosis not present

## 2023-09-12 DIAGNOSIS — I4891 Unspecified atrial fibrillation: Secondary | ICD-10-CM | POA: Diagnosis not present

## 2023-09-12 DIAGNOSIS — I5033 Acute on chronic diastolic (congestive) heart failure: Secondary | ICD-10-CM | POA: Diagnosis not present

## 2023-09-12 DIAGNOSIS — I509 Heart failure, unspecified: Secondary | ICD-10-CM | POA: Diagnosis not present

## 2023-09-12 DIAGNOSIS — D649 Anemia, unspecified: Secondary | ICD-10-CM | POA: Diagnosis not present

## 2023-09-12 LAB — BASIC METABOLIC PANEL WITH GFR
Anion gap: 9 (ref 5–15)
BUN: 23 mg/dL (ref 8–23)
CO2: 31 mmol/L (ref 22–32)
Calcium: 9.2 mg/dL (ref 8.9–10.3)
Chloride: 105 mmol/L (ref 98–111)
Creatinine, Ser: 1.36 mg/dL — ABNORMAL HIGH (ref 0.61–1.24)
GFR, Estimated: 55 mL/min — ABNORMAL LOW (ref >60)
Glucose, Bld: 127 mg/dL — ABNORMAL HIGH (ref 70–99)
Potassium: 4 mmol/L (ref 3.5–5.1)
Sodium: 145 mmol/L (ref 135–145)

## 2023-09-12 LAB — CBC
HCT: 29.5 % — ABNORMAL LOW (ref 39.0–52.0)
Hemoglobin: 7.2 g/dL — ABNORMAL LOW (ref 13.0–17.0)
MCH: 20.4 pg — ABNORMAL LOW (ref 26.0–34.0)
MCHC: 24.4 g/dL — ABNORMAL LOW (ref 30.0–36.0)
MCV: 83.6 fL (ref 80.0–100.0)
Platelets: 257 10*3/uL (ref 150–400)
RBC: 3.53 MIL/uL — ABNORMAL LOW (ref 4.22–5.81)
RDW: 21.5 % — ABNORMAL HIGH (ref 11.5–15.5)
WBC: 8.5 10*3/uL (ref 4.0–10.5)
nRBC: 0.8 % — ABNORMAL HIGH (ref 0.0–0.2)

## 2023-09-12 MED ORDER — FERROUS SULFATE 325 (65 FE) MG PO TABS
325.0000 mg | ORAL_TABLET | Freq: Every day | ORAL | Status: DC
  Administered 2023-09-14 – 2023-09-28 (×15): 325 mg via ORAL
  Filled 2023-09-12 (×16): qty 1

## 2023-09-12 MED ORDER — DOCUSATE SODIUM 100 MG PO CAPS
100.0000 mg | ORAL_CAPSULE | Freq: Two times a day (BID) | ORAL | Status: DC
  Administered 2023-09-12 – 2023-09-28 (×30): 100 mg via ORAL
  Filled 2023-09-12 (×32): qty 1

## 2023-09-12 MED ORDER — FUROSEMIDE 10 MG/ML IJ SOLN
18.0000 mg/h | INTRAVENOUS | Status: DC
  Administered 2023-09-12 – 2023-09-14 (×3): 8 mg/h via INTRAVENOUS
  Administered 2023-09-16 – 2023-09-17 (×2): 12 mg/h via INTRAVENOUS
  Administered 2023-09-18 – 2023-09-21 (×7): 15 mg/h via INTRAVENOUS
  Administered 2023-09-22 – 2023-09-25 (×7): 18 mg/h via INTRAVENOUS
  Filled 2023-09-12 (×28): qty 20

## 2023-09-12 NOTE — Progress Notes (Signed)
 Occupational Therapy Treatment Patient Details Name: RAYDEL HOSICK MRN: [id number] DOB: 11-02-1950 Today's Date: 09/12/2023   History of present illness Frederick Brown is a 73 y.o. male with medical history significant for diastolic CHF, alcohol abuse, atrial fibrillation, hypertension, gout, morbid obesity.  Patient presented to the ED with complaints of generalized weakness, and increased difficulty breathing with exertion with bilateral lower extremity swelling for the past 3 weeks, with abdominal distention.  He supposed to be on Lasix  40 mg TID but has not been compliant.  His alcohol daily-about 2 glasses of vodka daily.  He lives alone and has had difficulty ambulating (due to weakness, dyspnea and leg swelling) to get his alcohol beverages hence he has not had any in the past 3 to 4 days.  Denies hallucinations or shakes. He denies history of alcohol withdrawal.   OT comments  Pt agreeable to OT and PT co-treatment. Very slow and labored movement with frequent rests breaks today. Mod A for bed mobility and mod to max A for sit to stand from EOB with some lateral steps to L side. Max A to doff socks but pt was able to slide on shoes with mod A. Much labored effort and reports of fatigue. Improved ability to stand from an elevated surface today.       If plan is discharge home, recommend the following:  Two people to help with walking and/or transfers;A lot of help with bathing/dressing/bathroom;Assistance with cooking/housework;Assist for transportation;Help with stairs or ramp for entrance   Equipment Recommendations  None recommended by OT          Precautions / Restrictions Precautions Precautions: Fall Recall of Precautions/Restrictions: Intact Restrictions Weight Bearing Restrictions Per Provider Order: No       Mobility Bed Mobility Overal bed mobility: Needs Assistance Bed Mobility: Supine to Sit     Supine to sit: Mod assist     General bed mobility comments:  slow labored movement; assist to scoot to EOB    Transfers Overall transfer level: Needs assistance Equipment used: Rolling walker (2 wheels) Transfers: Sit to/from Stand Sit to Stand: Mod assist, Max assist           General transfer comment: Improved sit to stand with +2 assist for safety. Able to take L lateral steps to head of bed as well.     Balance Overall balance assessment: Needs assistance Sitting-balance support: Feet supported, No upper extremity supported Sitting balance-Leahy Scale: Fair Sitting balance - Comments: fair/good seated at EOB   Standing balance support: Reliant on assistive device for balance, During functional activity, Bilateral upper extremity supported Standing balance-Leahy Scale: Poor Standing balance comment: using RW                           ADL either performed or assessed with clinical judgement   ADL Overall ADL's : Needs assistance/impaired                     Lower Body Dressing: Moderate assistance;Sitting/lateral leans;Maximal assistance Lower Body Dressing Details (indicate cue type and reason): Able to slide on shoes with assist. Max A to doff socks seated at EOB.                     Communication Communication Communication: No apparent difficulties   Cognition Arousal: Alert Behavior During Therapy: WFL for tasks assessed/performed, Anxious Cognition: No apparent impairments  Following commands: Intact        Cueing   Cueing Techniques: Verbal cues, Tactile cues  Exercises                   Pertinent Vitals/ Pain       Pain Assessment Pain Assessment: Faces Faces Pain Scale: Hurts a little bit Pain Location: general pain all over, per patient Pain Descriptors / Indicators: Discomfort, Sore Pain Intervention(s): Monitored during session, Repositioned                                                           Frequency  Min 2X/week        Progress Toward Goals  OT Goals(current goals can now be found in the care plan section)  Progress towards OT goals: Progressing toward goals  Acute Rehab OT Goals Patient Stated Goal: return home OT Goal Formulation: With patient Time For Goal Achievement: 09/24/23 Potential to Achieve Goals: Good ADL Goals Pt Will Perform Upper Body Dressing: with modified independence Pt Will Perform Lower Body Dressing: with modified independence;sitting/lateral leans Pt Will Transfer to Toilet: with min assist;stand pivot transfer;with mod assist Pt Will Perform Toileting - Clothing Manipulation and hygiene: sitting/lateral leans;with contact guard assist Pt/caregiver will Perform Home Exercise Program: Increased strength;Both right and left upper extremity;Independently  Plan      Co-evaluation    PT/OT/SLP Co-Evaluation/Treatment: Yes Reason for Co-Treatment: To address functional/ADL transfers   OT goals addressed during session: ADL's and self-care                        End of Session Equipment Utilized During Treatment: Rolling walker (2 wheels);Gait belt  OT Visit Diagnosis: Unsteadiness on feet (R26.81);Other abnormalities of gait and mobility (R26.89);Muscle weakness (generalized) (M62.81)   Activity Tolerance Patient tolerated treatment well   Patient Left in bed;with call bell/phone within reach   Nurse Communication          Time: 1610-9604 OT Time Calculation (min): 32 min  Charges: OT General Charges $OT Visit: 1 Visit OT Treatments $Self Care/Home Management : 8-22 mins  Holden Maniscalco OT, MOT  Thurnell Floss 09/12/2023, 10:10 AM

## 2023-09-12 NOTE — Progress Notes (Incomplete)
 Attending Note   Patient seen and discussed with PA Finis Hugger, I agree with her documentation. 73 yo male history of chronic afib, HTN, OSA intolerant to cpap, EtOH abuse,  admitted 09/08/23 with SOB and progressive LE edema.     WBC 6.9 Hgb 7.6 K 3.7 BUN 21 Cr 1.4 Lactic 1 Mg 2.4 BNP 875 Ferritin 5 TSH 0.505 Trop 9--> 9 FOBT neg EKG afib, nonspecific ST/T changes CXR +interstitial edema, small right and trace left pleural effusion   12/2016: LVEF 50-55%, mod BAE, normal RV 08/2023 echo: LVEF 55-60%, no WMAs, indet diastolic function, D shaped septum, mod RV dysfunction, mod pulm HTN, mild LAE, mild MR, mod to severe TR,   Assessment and Plan  1.Chronic HFpEF/RV failure -BNP 875, CXR +interstitial edema, small right and trace left pleural effusion  -on IV lasix  60mg  bid, I/Os incomplete.From documentation only 1.5 L of uop yestery, no input documented. Bed weights appear inaccurate. Unknown baseline weight. Mild variation in Cr without clear trend, within prior range.  - massively volume overloaded, transition to lasix  gtt at 8mg /hr.  - consider SGLT2i closer to discharge.    - suspect pulmonary HTN and RV dysfunction related to LV diastolic dysfunction, OSA not treated. Very likely given BMI may have a component of OHS.  - Would plan on outpatient PFTs, sleep study, VQ scan.   2.Chronic afib - rate controlled, on xarelto  for stroke prevention    3. Anemia - admit Hgb 7.6, 1 year ago Hgb 11 - ferritin 5, FOBT neg - given IV iron   Armida Lander MD

## 2023-09-12 NOTE — Consult Note (Addendum)
 Cardiology Consultation   Patient ID: TERRYN REDNER MRN: [id number]; DOB: 06-17-1950  Admit date: 09/08/2023 Date of Consult: 09/12/2023  PCP:  Minus Amel, MD   [facility] Providers Cardiologist:  Janelle Mediate, MD        Patient Profile: Frederick Brown is a 73 y.o. male with a hx of permanent atrial fibrillation, chronic HFpEF, HTN, HLD, alcohol use and OSA who is being seen 09/12/2023 for the evaluation of acute HFpEF at the request of Dr. Joan Mouton.  History of Present Illness: Frederick Brown most recently had a telehealth visit with Dr. Stann Earnest in 05/2021 and was consuming 2-3 vodka drinks a day and was encouraged to reduce his use given the use of anticoagulation. He denied any anginal symptoms. No changes were made to his current medications and he was continued on Lasix  80 mg in AM/40 mg in PM, Lopressor  50 mg twice daily, Crestor  20 mg daily and Xarelto  20 mg daily.  Was informed to follow-up in 1 year but has canceled or no-show for visits in the interim.  He presented to [facility] ED on 09/08/2023 and reported worsening shortness of breath, lower extremity edema and coughing for the past few weeks. Was not taking his Lasix  daily. Initial labs showed WBC 6.9, Hgb 7.6 (previously 11.0 in 05/2022), platelets 300, Na+ 143, K+ 3.7 and creatinine 1.40 (previously 1.35 in 2024).  Lactic acid 1.0.  Magnesium  2.4. BNP elevated 875. Initial and repeat Hs troponin values negative at 9. CXR showed cardiomegaly and interstitial edema with small right and trace left pleural effusions. EKG showed rate controlled atrial fibrillation, heart rate 68 with IVCD. Repeat echocardiogram was obtained which shows a preserved EF of 55 to 60% with no regional wall motion abnormalities.  Noted to have mild LVH, moderately reduced RV function and moderately elevated PASP at 58 mmHg.  Also noted to have mild MR and moderate to severe TR.  He was admitted for further management of his acute CHF exacerbation.  Also placed on CIWA protocol given alcohol use as he reported drinking at least 2 glasses of vodka daily. He was started on IV Lasix  40 mg twice daily and this was titrated to 60 mg twice daily on 09/11/2023. He has a recorded net output of -3.7 L. Weight has overall been unchanged as recorded at 407 lbs on admission at 405 lbs today. Creatinine stable at 1.36. In regards to his anemia, Ferritin was low at 5 with Iron  at 16 and he received IV iron  this admission. Has not yet been started on PO supplementation.   In talking with the patient today, he reports worsening dyspnea on exertion, a dry cough and lower extremity edema for months. No chest pain or palpitations. No specific orthopnea or PND. Does not use a CPAP. Does not weigh himself and unsure of dry weight but thinks around 370 - 380 lbs. He is not active at baseline and does not go to the grocery store. Uses [employer] and [employer] delivery. Will go several days without taking Lasix . Says he adds Himalayan salt to most foods as he thought this was better for him than regular salt. He does consume several bottles of water  throughout the day. Also consumes 2-3 mixed drinks daily in the form of Vodka mixed with Sunny D and lime juice.    Past Medical History:  Diagnosis Date   A-fib [facility])    Alcohol abuse    CHF (congestive heart failure) (HCC)    Chronic respiratory  failure with hypoxia (HCC) 05/28/2011   Cor pulmonale, chronic (HCC) February 2013   Degenerative joint disease    Fracture of great toe, left, closed 05/28/2011   Fracture of metatarsal of left foot, closed 05/28/2011   Gastroesophageal reflux disease    Hypertension    Obesity hypoventilation syndrome [facility]) February 2013   Obesity, morbid (more than 100 lbs over ideal weight or BMI > 40) (HCC)    Pickwickian syndrome (HCC)    Umbilical hernia    Urticarial rash 05/31/2011   From Cipro    Vitamin B12 deficiency 05/22/2011    Past Surgical History:  Procedure Laterality Date    CHOLECYSTECTOMY     CYSTOSCOPY     CYSTOSCOPY W/ URETERAL STENT PLACEMENT Bilateral 02/22/2015   Procedure: CYSTOSCOPY WITH BILATERAL STENT EXCHANGE;  Surgeon: Marco Severs, MD;  Location: [facility];  Service: Urology;  Laterality: Bilateral;   CYSTOSCOPY WITH HOLMIUM LASER LITHOTRIPSY Bilateral 02/22/2015   Procedure: CYSTOSCOPY WITH HOLMIUM LASER BILATERAL LITHOTRIPSY;  Surgeon: Marco Severs, MD;  Location: [facility];  Service: Urology;  Laterality: Bilateral;   CYSTOSCOPY WITH RETROGRADE PYELOGRAM, URETEROSCOPY AND STENT PLACEMENT Bilateral 01/18/2015   Procedure: CYSTOSCOPY WITH BILATERAL RETROGRADE PYELOGRAM, AND BILATERAL STENT PLACEMENT;  Surgeon: Marco Severs, MD;  Location: [facility];  Service: Urology;  Laterality: Bilateral;   CYSTOSCOPY/RETROGRADE/URETEROSCOPY/STONE EXTRACTION WITH BASKET Bilateral 02/22/2015   Procedure: CYSTOSCOPY/BILATERAL RETROGRADE/BILATERAL URETEROSCOPY/BILATERAL STONE EXTRACTION WITH BASKET;  Surgeon: Marco Severs, MD;  Location: [facility];  Service: Urology;  Laterality: Bilateral;   KNEE SURGERY       Home Medications:  Prior to Admission medications   Medication Sig Start Date End Date Taking? Authorizing Provider  acetaminophen  (TYLENOL ) 500 MG tablet Take 1,000 mg by mouth every 8 (eight) hours as needed for mild pain (pain score 1-3) or headache.   Yes [provider]  allopurinol  (ZYLOPRIM ) 300 MG tablet Take 300 mg by mouth daily.   Yes [provider]  Cholecalciferol (VITAMIN D -3 PO) Take 1 tablet by mouth daily.   Yes [provider]  furosemide  (LASIX ) 40 MG tablet Take 40 mg by mouth 2 (two) times daily. 10/10/13  Yes [provider]  Melatonin 10 MG TABS Take 1 tablet by mouth at bedtime.   Yes [provider]  metoprolol  tartrate (LOPRESSOR ) 25 MG tablet Take 2 tablets (50 mg total) by mouth 2 (two) times daily. 02/25/15  Yes Tyra Galley, MD  omeprazole (PRILOSEC) 40 MG capsule Take 40 mg  by mouth 3 (three) times a week. 06/14/23  Yes [provider]  rivaroxaban  (XARELTO ) 20 MG TABS tablet TAKE 1 TABLET(20 MG) BY MOUTH DAILY WITH SUPPER 01/22/23  Yes Loyde Rule, MD  rosuvastatin  (CRESTOR ) 20 MG tablet Take 1 tablet (20 mg total) by mouth daily. 11/05/22  Yes Nishan, Peter C, MD    Scheduled Meds:  allopurinol   300 mg Oral Daily   folic acid   1 mg Oral Daily   furosemide   60 mg Intravenous BID   leptospermum manuka honey  1 Application Topical Daily   melatonin  3 mg Oral QHS   metoprolol  tartrate  12.5 mg Oral BID   multivitamin with minerals  1 tablet Oral Daily   pantoprazole   40 mg Oral Daily   rivaroxaban   20 mg Oral Daily   rosuvastatin   20 mg Oral Daily   thiamine   100 mg Oral Daily   Or   thiamine   100 mg Intravenous Daily   Continuous Infusions:  PRN Meds: acetaminophen  **OR** acetaminophen , guaiFENesin -dextromethorphan , ondansetron  **OR** ondansetron  (ZOFRAN ) IV, polyethylene glycol  Allergies:   No Known Allergies  Social History:   Social History   Socioeconomic History   Marital status: Divorced    Spouse name: Not on file   Number of children: Not on file   Years of education: Not on file   Highest education level: Not on file  Occupational History   Not on file  Tobacco Use   Smoking status: Former    Current packs/day: 0.00    Types: Cigarettes    Quit date: 04/02/1981    Years since quitting: 42.4   Smokeless tobacco: Never  Vaping Use   Vaping status: Never Used  Substance and Sexual Activity   Alcohol use: Yes    Alcohol/week: 1.0 standard drink of alcohol    Types: 1 Shots of liquor per week    Comment: 2-3 drinks daily   Drug use: No   Sexual activity: Not Currently  Other Topics Concern   Not on file  Social History Narrative   Not on file    Family History:    Family History  Problem Relation Age of Onset   Heart attack Father    Heart failure Father    Stroke Father    Hypertension Father      ROS:   Please see the history of present illness.   All other ROS reviewed and negative.     Physical Exam/Data: Vitals:   09/11/23 1335 09/11/23 2019 09/12/23 0410 09/12/23 0552  BP: 103/61 (!) 130/47 116/65   Pulse: 79 73 82   Resp:  20 (!) 22   Temp: 100.1 F (37.8 C) 98.8 F (37.1 C) 98 F (36.7 C)   TempSrc: Oral Oral Oral   SpO2: 93% 96% 91%   Weight:    (!) 184.1 kg  Height:        Intake/Output Summary (Last 24 hours) at 09/12/2023 1006 Last data filed at 09/12/2023 0552 Gross per 24 hour  Intake --  Output 1450 ml  Net -1450 ml      09/12/2023    5:52 AM 09/11/2023    6:04 AM 09/10/2023    4:38 AM  Last 3 Weights  Weight (lbs) 405 lb 13.9 oz 400 lb 12.7 oz 405 lb 6.8 oz  Weight (kg) 184.1 kg 181.8 kg 183.9 kg     Body mass index is 58.24 kg/m.  General:  Morbidly obese male appearing in no acute distress.  HEENT: normal Neck:  JVD unable to be assessed due to body habitus.  Vascular: No carotid bruits; Distal pulses 2+ bilaterally Cardiac:  normal S1, S2; Irregularly irregular Lungs: decreased breath sounds along bases bilaterally.  Abd: soft, nontender, no hepatomegaly  Ext: 2+ pitting edema bilaterally.  Musculoskeletal:  No deformities, BUE and BLE strength normal and equal Skin: warm and dry  Neuro:  CNs 2-12 intact, no focal abnormalities noted Psych:  Normal affect   EKG:  The EKG was personally reviewed and demonstrates:  rate controlled atrial fibrillation, heart rate 68 with IVCD Telemetry:  Telemetry was personally reviewed and demonstrates:  Atrial fibrillation, HR in 70's to 80's with occasional PVC's and couplets.   Relevant CV Studies:  Echocardiogram: 08/2023 IMPRESSIONS     1. Left ventricular ejection fraction, by estimation, is 55 to 60%. The  left ventricle has normal function. The left ventricle has no regional  wall motion abnormalities. The left ventricular internal cavity size was  mildly  dilated. There is mild  concentric left  ventricular hypertrophy. Left ventricular diastolic  parameters are indeterminate. There is the interventricular septum is  flattened in systole and diastole, consistent with right ventricular  pressure and volume overload.   2. Right ventricular systolic function is moderately reduced. The right  ventricular size is mildly enlarged with prominent apical trabeculation.  There is moderately elevated pulmonary artery systolic pressure. The  estimated right ventricular systolic  pressure is 58.0 mmHg.   3. Left atrial size was mildly dilated.   4. Right atrial size was mildly dilated.   5. The mitral valve is degenerative. Mild mitral valve regurgitation.   6. Tricuspid valve regurgitation is moderate to severe.   7. The aortic valve was not well visualized. There is moderate  calcification of the aortic valve. Aortic valve regurgitation is not  visualized. Aortic valve sclerosis/calcification is present, without any  evidence of aortic stenosis.   8. The inferior vena cava is dilated in size with <50% respiratory  variability, suggesting right atrial pressure of 15 mmHg.   Comparison(s): Prior images unable to be directly viewed.   Laboratory Data: High Sensitivity Troponin:   Recent Labs  Lab 09/08/23 1719 09/08/23 2128  TROPONINIHS 9 9     Chemistry Recent Labs  Lab 09/08/23 1719 09/09/23 0405 09/10/23 0404 09/11/23 0432 09/12/23 0425  NA 143   < > 144 143 145  K 3.7   < > 3.8 3.7 4.0  CL 107   < > 106 105 105  CO2 28   < > 28 30 31   GLUCOSE 106*   < > 120* 114* 127*  BUN 21   < > 22 21 23   CREATININE 1.40*   < > 1.26* 1.29* 1.36*  CALCIUM  9.4   < > 9.5 9.4 9.2  MG 2.4  --   --   --   --   GFRNONAA 53*   < > >60 59* 55*  ANIONGAP 8   < > 10 8 9    < > = values in this interval not displayed.    Recent Labs  Lab 09/08/23 1719  PROT 6.0*  ALBUMIN 3.0*  AST 21  ALT 9  ALKPHOS 62  BILITOT 1.0   Lipids No results for input(s): CHOL, TRIG, HDL, LABVLDL,  LDLCALC, CHOLHDL in the last 168 hours.  Hematology Recent Labs  Lab 09/10/23 0404 09/11/23 0432 09/12/23 0425  WBC 8.2 7.9 8.5  RBC 3.45*  3.44* 3.55* 3.53*  HGB 7.2* 7.6* 7.2*  HCT 29.0* 30.2* 29.5*  MCV 84.1 85.1 83.6  MCH 20.9* 21.4* 20.4*  MCHC 24.8* 25.2* 24.4*  RDW 21.5* 21.2* 21.5*  PLT 260 256 257   Thyroid   Recent Labs  Lab 09/10/23 0404  TSH 0.505    BNP Recent Labs  Lab 09/08/23 1719  BNP 875.0*    DDimer No results for input(s): DDIMER in the last 168 hours.  Radiology/Studies:   DG Chest Port 1 View Result Date: 09/08/2023 CLINICAL DATA:  sob EXAM: PORTABLE CHEST - 1 VIEW COMPARISON:  None available. FINDINGS: Diffuse bilateral perihilar interstitial opacities. Blunting of the right costophrenic angle with hazy airspace attenuation, likely reflecting a small effusion and atelectasis. Trace left pleural effusion. Cardiomegaly. Tortuous aorta with aortic atherosclerosis. No acute fracture or destructive lesions. Multilevel thoracic osteophytosis. IMPRESSION: Cardiomegaly with findings of interstitial edema. Small right and trace left pleural effusions. Electronically Signed   By: Rance Burrows M.D.   On: 09/08/2023 18:02  Assessment and Plan:  1. Acute HFpEF/RV Dysfunction - Presented with worsening fluid buildup for several months. BNP 875 and CXR consistent with CHF. Echo shows a preserved EF of 55 to 60% with moderately reduced RV function, moderately elevated PASP at 58 mmHg, mild MR and moderate to severe TR. - Unsure of dry weight but reports around 370 - 380 lbs and at 405 lbs today. I&O's not fully recorded but listed at -3.7 L thus far. Currently receiving IV Lasix  60mg  BID and still volume overloaded on examination and requiring supplemental oxygen. Will review with Dr. Amanda Jungling in regards to titration of Lasix  to 80mg  BID and adding intermittent Metolazone  or switching to a Lasix  drip. Would be hesitant to use an SGLT2 inhibitor given his  sedentary state and body habitus as he would be at high-risk for infections.  - Not using a CPAP prior to admission and would recommend arranging for a sleep study as an outpatient. We discussed the importance of reducing his sodium intake and limiting fluid intake to ~ 2 L daily.   2. Permanent Afib - HR has been well-controlled in the 70's to 80's. Continue Lopressor  12.5mg  BID for rate-control.  - No reports of active bleeding. He does have anemia as discussed below. FOBT negative. Remains on Xarelto  20mg  daily for anticoagulation.    3. HTN - BP has overall been well-controlled, at 116/65 on most recent check. Continue Lopressor  12.5mg  BID.   4. HLD - He has been continued on Crestor  20mg  daily.   5. Anemia - Felt to be due to iron  deficiency given Ferritin at 5 and Iron  at 16. Received IV iron  this admission. Would anticipate starting PO dosing. Will defer further management to the admitting team.   6. Alcohol Use - Consumes 2-3 mixed drinks daily as discussed above. Was encouraged to reduce consumption, especially in the setting of his anemia and the need for anticoagulation.    Risk Assessment/Risk Scores:  New York  Heart Association (NYHA) Functional Class NYHA Class III  CHA2DS2-VASc Score = 3   This indicates a 3.2% annual risk of stroke. The patient's score is based upon: CHF History: 1 HTN History: 1 Diabetes History: 0 Stroke History: 0 Vascular Disease History: 0 Age Score: 1 Gender Score: 0    For questions or updates, please contact [facility] Please consult [email or web address] for contact info under    Signed, HAJI DELAINE, PA-C  09/12/2023 10:06 AM  Attending Note   Patient seen and discussed with PA Finis Hugger, I agree with her documentation. 73 yo male history of chronic afib, HTN, OSA intolerant to cpap, EtOH abuse,  admitted 09/08/23 with SOB and progressive LE edema.     WBC 6.9 Hgb 7.6 K 3.7 BUN 21 Cr 1.4 Lactic 1 Mg 2.4 BNP 875  Ferritin 5 TSH 0.505 Trop 9--> 9 FOBT neg EKG afib, nonspecific ST/T changes CXR +interstitial edema, small right and trace left pleural effusion   12/2016: LVEF 50-55%, mod BAE, normal RV 08/2023 echo: LVEF 55-60%, no WMAs, indet diastolic function, D shaped septum, mod RV dysfunction, mod pulm HTN, mild LAE, mild MR, mod to severe TR,   Assessment and Plan  1.Chronic HFpEF/RV failure -BNP 875, CXR +interstitial edema, small right and trace left pleural effusion  -on IV lasix  60mg  bid, I/Os incomplete.From documentation only 1.5 L of uop yestery, no input documented. Bed weights appear inaccurate. Unknown baseline weight. Mild variation in Cr without clear trend, within prior range.  - massively volume overloaded,  transition to lasix  gtt at 8mg /hr.  - consider SGLT2i closer to discharge.    - suspect pulmonary HTN and RV dysfunction related to LV diastolic dysfunction, OSA not treated. Very likely given BMI may have a component of OHS.  - Would plan on outpatient PFTs, sleep study, VQ scan.   2.Chronic afib - rate controlled, on xarelto  for stroke prevention    3. Anemia - admit Hgb 7.6, 1 year ago Hgb 11 - ferritin 5, FOBT neg - given IV iron   Armida Lander MD

## 2023-09-12 NOTE — Progress Notes (Signed)
 Pt took oxygen off several times during the night. Pt educated on importance of wearing his oxygen. Oxygen saturation low this a.m. (80s), attempted to sit patient up in the bed to raise SP02 and pt stated That makes me feel like I can't breathe even more and I am not able to cough while sitting up. Pt saturating at 89-90% on 2.5 L, bumped 02 to 3 L, SP02 94%. Pt irritated with staff entering his room to get vitals, lab work, etc.. during the night and requested not to be disturbed while he is sleeping.

## 2023-09-12 NOTE — Progress Notes (Signed)
  Progress Note   Patient: Frederick Brown WGN:[id number] DOB: 07-17-1950 DOA: 09/08/2023     4 DOS: the patient was seen and examined on 09/12/2023   Brief hospital course: 73 y.o. male with medical history significant for diastolic CHF, alcohol abuse, atrial fibrillation, hypertension, gout, morbid obesity.  Presented with generalized weakness, shortness of breath and worsening leg swelling.  He had not been compliant with his furosemide  at home.  Drinks about 2 glasses of vodka on a daily basis.  He was hospitalized for further management of volume overload.   Assessment and Plan: Acute on chronic diastolic CHF/acute respiratory failure with hypoxia Pt admitted to noncomliance with diuretic  Last echo from 2018 showed LVEF of 55%. Repeat echo with normal LVEF but with evidence of vol overload Strict ins and outs and daily weights. Remains grossly vol overloaded Cardiology consulted. Recs to transition to lasix  gtt   Alcohol abuse Counseled.  Pt claims that he has only 2 drinks every evening while cooking his meal.  Continue CIWA protocol thiamine  folate multivitamins.  No evidence for withdrawal currently.   Persistent atrial fibrillation Noted to be on metoprolol .  Borderline low blood pressures noted.  Will hold metoprolol  for today and decrease the dose from tomorrow. Anticoagulated with Xarelto .  Low hemoglobin noted.   Normocytic anemia Hemoglobin of 7.6 noted.  No bleeding episodes.  Patient denies any black stools.  Stool was Hemoccult negative.  Patient mentioned to Dr. Lyndon Santiago that he had a colonoscopy within the last 2 to 3 years and which was unremarkable. -On chart review, noted colonoscopy in 2009 with 3 tubular adenomas  Iron  level is low at 16 with sat of 4%. S/p dose of IV iron . On iron  supplementation   Chronic kidney disease stage IIIa Renal function is stable.  Avoid nephrotoxic agents.   Essential hypertension Monitor blood pressures closely.   History of  gout Continue allopurinol .   Morbid obesity Estimated body mass index is 58.01 kg/m as calculated from the following:   Height as of this encounter: 5' 10 (1.778 m).   Weight as of this encounter: 183.4 kg.      Subjective: Continues to feel sob. LE remains swollen  Physical Exam: Vitals:   09/11/23 2019 09/12/23 0410 09/12/23 0552 09/12/23 1321  BP: (!) 130/47 116/65  (!) 102/36  Pulse: 73 82  68  Resp: 20 (!) 22  20  Temp: 98.8 F (37.1 C) 98 F (36.7 C)  97.9 F (36.6 C)  TempSrc: Oral Oral  Oral  SpO2: 96% 91%  98%  Weight:   (!) 184.1 kg   Height:       General exam: Awake, laying in bed, in nad Respiratory system: Normal respiratory effort, no wheezing Cardiovascular system: regular rate, s1, s2 Gastrointestinal system: Soft, nondistended, positive BS Central nervous system: CN2-12 grossly intact, strength intact Extremities: Perfused, no clubbing Skin: Normal skin turgor, no notable skin lesions seen Psychiatry: Mood normal // no visual hallucinations   Data Reviewed:  Labs reviewed: Na 145, K 4.0, Cr 1.36, WBC 8.5, Hgb 7.2, Plts 257  Family Communication: Pt in room, family not at bedside  Disposition: Status is: Inpatient Remains inpatient appropriate because: severity of illness  Planned Discharge Destination: Home     Author: Cherylle Corwin, MD 09/12/2023 6:03 PM  For on call review [email or web address].

## 2023-09-12 NOTE — Progress Notes (Signed)
 Physical Therapy Treatment Patient Details Name: Frederick Brown MRN: [id number] DOB: [birth year] Today's Date: 09/12/2023   History of Present Illness Frederick Brown is a 73 y.o. male with medical history significant for diastolic CHF, alcohol abuse, atrial fibrillation, hypertension, gout, morbid obesity.  Patient presented to the ED with complaints of generalized weakness, and increased difficulty breathing with exertion with bilateral lower extremity swelling for the past 3 weeks, with abdominal distention.  He supposed to be on Lasix  40 mg TID but has not been compliant.  His alcohol daily-about 2 glasses of vodka daily.  He lives alone and has had difficulty ambulating (due to weakness, dyspnea and leg swelling) to get his alcohol beverages hence he has not had any in the past 3 to 4 days.  Denies hallucinations or shakes. He denies history of alcohol withdrawal.    PT Comments  Patient demonstrates increase BLE strength for completing sit to stands, required bed elevated and able to take a few side steps before having to sit mostly due to c/o fatigue and generalized weakness. Patient tolerated sitting up at bedside after therapy with tray in front - nursing staff notified. Patient will benefit from continued skilled physical therapy in hospital and recommended venue below to increase strength, balance, endurance for safe ADLs and gait.      If plan is discharge home, recommend the following: A lot of help with bathing/dressing/bathroom;A lot of help with walking and/or transfers;Help with stairs or ramp for entrance;Assistance with cooking/housework   Can travel by private vehicle     No  Equipment Recommendations  None recommended by PT    Recommendations for Other Services       Precautions / Restrictions Precautions Precautions: Fall Recall of Precautions/Restrictions: Intact Restrictions Weight Bearing Restrictions Per Provider Order: No     Mobility  Bed Mobility Overal  bed mobility: Needs Assistance Bed Mobility: Supine to Sit     Supine to sit: Mod assist     General bed mobility comments: increased time, labored movment requiring HOB raised and use of bed rails    Transfers Overall transfer level: Needs assistance Equipment used: Rolling walker (2 wheels) Transfers: Sit to/from Stand Sit to Stand: Mod assist, Max assist, From elevated surface           General transfer comment: Patient required [facility] raised for completing sit to stands and required much time for resting before able to complete    Ambulation/Gait Ambulation/Gait assistance: Mod assist, Max assist Gait Distance (Feet): 4 Feet Assistive device: Rolling walker (2 wheels) Gait Pattern/deviations: Decreased step length - right, Decreased step length - left, Decreased stride length Gait velocity: slow     General Gait Details: limited to a few side steps at bedside before having to sit due to c/o fatigue   Stairs             Wheelchair Mobility     Tilt Bed    Modified Rankin (Stroke Patients Only)       Balance       Sitting balance - Comments: fair/good seated at EOB   Standing balance support: Reliant on assistive device for balance, During functional activity, Bilateral upper extremity supported Standing balance-Leahy Scale: Poor Standing balance comment: using RW                            Communication Communication Communication: No apparent difficulties  Cognition Arousal: Alert Behavior During Therapy: [facility] for  tasks assessed/performed, Anxious   PT - Cognitive impairments: No apparent impairments                         Following commands: Intact      Cueing Cueing Techniques: Verbal cues, Tactile cues  Exercises      General Comments        Pertinent Vitals/Pain Pain Assessment Pain Assessment: Faces Faces Pain Scale: Hurts a little bit Pain Location: general pain all over, per patient Pain Descriptors  / Indicators: Discomfort, Sore Pain Intervention(s): Limited activity within patient's tolerance, Monitored during session, Repositioned    Home Living                          Prior Function            PT Goals (current goals can now be found in the care plan section) Acute Rehab PT Goals Patient Stated Goal: return home with neighbor, friends to assist PT Goal Formulation: With patient Time For Goal Achievement: 09/23/23 Potential to Achieve Goals: Good Progress towards PT goals: Progressing toward goals    Frequency    Min 3X/week      PT Plan      Co-evaluation PT/OT/SLP Co-Evaluation/Treatment: Yes Reason for Co-Treatment: To address functional/ADL transfers PT goals addressed during session: Mobility/safety with mobility;Balance;Proper use of DME OT goals addressed during session: ADL's and self-care      AM-PAC PT 6 Clicks Mobility   Outcome Measure  Help needed turning from your back to your side while in a flat bed without using bedrails?: A Little Help needed moving from lying on your back to sitting on the side of a flat bed without using bedrails?: A Little Help needed moving to and from a bed to a chair (including a wheelchair)?: Total Help needed standing up from a chair using your arms (e.g., wheelchair or bedside chair)?: A Lot Help needed to walk in hospital room?: A Lot Help needed climbing 3-5 steps with a railing? : Total 6 Click Score: 12    End of Session Equipment Utilized During Treatment: Oxygen Activity Tolerance: Patient tolerated treatment well;Patient limited by fatigue Patient left: in bed;with call bell/phone within reach;Other (comment) (left sitting at bedside with tray in front) Nurse Communication: Mobility status PT Visit Diagnosis: Unsteadiness on feet (R26.81);Other abnormalities of gait and mobility (R26.89);Muscle weakness (generalized) (M62.81)     Time: 1610-9604 PT Time Calculation (min) (ACUTE ONLY): 31  min  Charges:    $Therapeutic Activity: 23-37 mins PT General Charges $$ ACUTE PT VISIT: 1 Visit                     12:19 PM, 09/12/23 Walton Guppy, MPT Physical Therapist with [facility] [phone] office [phone] mobile phone

## 2023-09-13 DIAGNOSIS — I5033 Acute on chronic diastolic (congestive) heart failure: Secondary | ICD-10-CM | POA: Diagnosis not present

## 2023-09-13 MED ORDER — NYSTATIN 100000 UNIT/GM EX POWD
Freq: Two times a day (BID) | CUTANEOUS | Status: DC
  Administered 2023-09-14 – 2023-09-21 (×4): 1 via TOPICAL
  Filled 2023-09-13 (×4): qty 15

## 2023-09-13 NOTE — Plan of Care (Signed)
   Problem: Education: Goal: Knowledge of General Education information will improve Description: Including pain rating scale, medication(s)/side effects and non-pharmacologic comfort measures Outcome: Progressing   Problem: Coping: Goal: Level of anxiety will decrease Outcome: Progressing   Problem: Safety: Goal: Ability to remain free from injury will improve Outcome: Progressing

## 2023-09-13 NOTE — Progress Notes (Signed)
 PROGRESS NOTE    Frederick Brown  ZOX:[id number] DOB: 1950-05-25 DOA: 09/08/2023 PCP: Minus Amel, MD   Brief Narrative:    73 y.o. male with medical history significant for diastolic CHF, alcohol abuse, atrial fibrillation, hypertension, gout, morbid obesity.  Presented with generalized weakness, shortness of breath and worsening leg swelling.  He had not been compliant with his furosemide  at home.  Drinks about 2 glasses of vodka on a daily basis.  He was hospitalized for further management of volume overload and currently remains on Lasix  drip.   Assessment & Plan:   Principal Problem:   Acute on chronic diastolic (congestive) heart failure (HCC) Active Problems:   Alcohol abuse   Atrial fibrillation (HCC)   Gout   Morbid obesity (HCC)   Essential hypertension  Assessment and Plan:  Acute on chronic diastolic CHF/acute respiratory failure with hypoxia Pt admitted to noncomliance with diuretic  Last echo from 2018 showed LVEF of 55%. Repeat echo with normal LVEF but with evidence of vol overload Strict ins and outs and daily weights. Remains grossly vol overloaded Cardiology consulted 6/13 with recommendations to transition to Lasix  drip Follow-up labs in a.m. -Currently on 5 L nasal cannula oxygen and typically on room air at home   Alcohol abuse Counseled.  Pt claims that he has only 2 drinks every evening while cooking his meal.  Continue CIWA protocol thiamine  folate multivitamins.  No evidence for withdrawal currently.   Persistent atrial fibrillation Noted to be on metoprolol .  Borderline low blood pressures noted.  Will hold metoprolol  for today and decrease the dose from tomorrow. Anticoagulated with Xarelto .  Low hemoglobin noted.   Normocytic anemia Hemoglobin of 7.6 noted.  No bleeding episodes.  Patient denies any black stools.  Stool was Hemoccult negative.  Patient mentioned to Dr. Lyndon Santiago that he had a colonoscopy within the last 2 to 3 years and which was  unremarkable. -On chart review, noted colonoscopy in 2009 with 3 tubular adenomas  Iron  level is low at 16 with sat of 4%. S/p dose of IV iron . On iron  supplementation -Follow-up a.m. CBC   Chronic kidney disease stage IIIa Renal function is stable.  Avoid nephrotoxic agents.   Essential hypertension Monitor blood pressures closely.   History of gout Continue allopurinol .   Morbid obesity Estimated body mass index is 58.01 kg/m as calculated from the following:   Height as of this encounter: 5' 10 (1.778 m).   Weight as of this encounter: 183.4 kg.  DVT prophylaxis: Rivaroxaban  Code Status: Full Family Communication: Neighbor at bedside Disposition Plan:  Status is: Inpatient Remains inpatient appropriate because: Need for IV medications.   Skin Assessment:  I have examined the patient's skin and I agree with the wound assessment as performed by the wound care RN as outlined below:  Pressure Injury 09/08/23 Thigh Posterior;Right;Upper Stage 3 -  Full thickness tissue loss. Subcutaneous fat may be visible but bone, tendon or muscle are NOT exposed. (Active)  09/08/23 2120  Location: Thigh  Location Orientation: Posterior;Right;Upper  Staging: Stage 3 -  Full thickness tissue loss. Subcutaneous fat may be visible but bone, tendon or muscle are NOT exposed.  Wound Description (Comments):   DO NOT USE:  Present on Admission: Yes  Dressing Type Honey;Foam - Lift dressing to assess site every shift 09/13/23 0905     Pressure Injury 09/08/23 Thigh Left;Posterior;Upper Stage 1 -  Intact skin with non-blanchable redness of a localized area usually over a bony prominence. (Active)  09/08/23  2120  Location: Thigh  Location Orientation: Left;Posterior;Upper  Staging: Stage 1 -  Intact skin with non-blanchable redness of a localized area usually over a bony prominence.  Wound Description (Comments):   DO NOT USE:  Present on Admission: Yes  Dressing Type Foam - Lift dressing to  assess site every shift 09/13/23 0100    Consultants:  Cardiology  Procedures:  None  Antimicrobials:  None   Subjective: Patient seen and evaluated today with no new acute complaints or concerns. No acute concerns or events noted overnight.  Currently on 5 L nasal cannula and continues to diurese.  Objective: Vitals:   09/12/23 2100 09/12/23 2119 09/13/23 0457 09/13/23 0500  BP: 136/70 136/70 (!) 109/53   Pulse: 92 92 81   Resp: 18  19   Temp:   98.4 F (36.9 C)   TempSrc:      SpO2: 93%  95%   Weight:    (!) 184.4 kg  Height:        Intake/Output Summary (Last 24 hours) at 09/13/2023 1204 Last data filed at 09/13/2023 0853 Gross per 24 hour  Intake 540 ml  Output 2050 ml  Net -1510 ml   Filed Weights   09/11/23 0604 09/12/23 0552 09/13/23 0500  Weight: (!) 181.8 kg (!) 184.1 kg (!) 184.4 kg    Examination:  General exam: Appears calm and comfortable, morbidly obese Respiratory system: Clear to auscultation. Respiratory effort normal.  5 L nasal cannula oxygen. Cardiovascular system: S1 & S2 heard, RRR.  Gastrointestinal system: Abdomen is soft Central nervous system: Alert and awake Extremities: Bilateral lower extremity pitting edema Skin: No significant lesions noted Psychiatry: Flat affect.    Data Reviewed: I have personally reviewed following labs and imaging studies  CBC: Recent Labs  Lab 09/08/23 1719 09/10/23 0404 09/11/23 0432 09/12/23 0425  WBC 6.9 8.2 7.9 8.5  NEUTROABS 5.3  --   --   --   HGB 7.6* 7.2* 7.6* 7.2*  HCT 29.6* 29.0* 30.2* 29.5*  MCV 83.4 84.1 85.1 83.6  PLT 300 260 256 257   Basic Metabolic Panel: Recent Labs  Lab 09/08/23 1719 09/09/23 0405 09/10/23 0404 09/11/23 0432 09/12/23 0425  NA 143 145 144 143 145  K 3.7 4.0 3.8 3.7 4.0  CL 107 108 106 105 105  CO2 28 29 28 30 31   GLUCOSE 106* 110* 120* 114* 127*  BUN 21 22 22 21 23   CREATININE 1.40* 1.37* 1.26* 1.29* 1.36*  CALCIUM  9.4 9.1 9.5 9.4 9.2  MG 2.4  --    --   --   --    GFR: Estimated Creatinine Clearance: 81.7 mL/min (A) (by C-G formula based on SCr of 1.36 mg/dL (H)). Liver Function Tests: Recent Labs  Lab 09/08/23 1719  AST 21  ALT 9  ALKPHOS 62  BILITOT 1.0  PROT 6.0*  ALBUMIN 3.0*   No results for input(s): LIPASE, AMYLASE in the last 168 hours. No results for input(s): AMMONIA in the last 168 hours. Coagulation Profile: No results for input(s): INR, PROTIME in the last 168 hours. Cardiac Enzymes: No results for input(s): CKTOTAL, CKMB, CKMBINDEX, TROPONINI in the last 168 hours. BNP (last 3 results) No results for input(s): PROBNP in the last 8760 hours. HbA1C: No results for input(s): HGBA1C in the last 72 hours. CBG: No results for input(s): GLUCAP in the last 168 hours. Lipid Profile: No results for input(s): CHOL, HDL, LDLCALC, TRIG, CHOLHDL, LDLDIRECT in the last 72 hours. Thyroid  Function  Tests: No results for input(s): TSH, T4TOTAL, FREET4, T3FREE, THYROIDAB in the last 72 hours. Anemia Panel: No results for input(s): VITAMINB12, FOLATE, FERRITIN, TIBC, IRON , RETICCTPCT in the last 72 hours. Sepsis Labs: Recent Labs  Lab 09/08/23 1719  LATICACIDVEN 1.0    No results found for this or any previous visit (from the past 240 hours).       Radiology Studies: No results found.      Scheduled Meds:  allopurinol   300 mg Oral Daily   docusate sodium   100 mg Oral BID   ferrous sulfate  325 mg Oral Q breakfast   folic acid   1 mg Oral Daily   leptospermum manuka honey  1 Application Topical Daily   melatonin  3 mg Oral QHS   metoprolol  tartrate  12.5 mg Oral BID   multivitamin with minerals  1 tablet Oral Daily   nystatin    Topical BID   pantoprazole   40 mg Oral Daily   rivaroxaban   20 mg Oral Daily   rosuvastatin   20 mg Oral Daily   thiamine   100 mg Oral Daily   Or   thiamine   100 mg Intravenous Daily   Continuous Infusions:   furosemide  (LASIX ) 200 mg in dextrose  5 % 100 mL (2 mg/mL) infusion 8 mg/hr (09/12/23 1422)     LOS: 5 days    Time spent: 55 minutes    Berl Bonfanti D Mason Sole, DO [facility]  If 7PM-7AM, please contact night-coverage [email or web address] 09/13/2023, 12:04 PM

## 2023-09-14 DIAGNOSIS — I5033 Acute on chronic diastolic (congestive) heart failure: Secondary | ICD-10-CM | POA: Diagnosis not present

## 2023-09-14 LAB — BASIC METABOLIC PANEL WITH GFR
Anion gap: 8 (ref 5–15)
BUN: 23 mg/dL (ref 8–23)
CO2: 32 mmol/L (ref 22–32)
Calcium: 8.9 mg/dL (ref 8.9–10.3)
Chloride: 100 mmol/L (ref 98–111)
Creatinine, Ser: 1.21 mg/dL (ref 0.61–1.24)
GFR, Estimated: >60 mL/min (ref >60)
Glucose, Bld: 111 mg/dL — ABNORMAL HIGH (ref 70–99)
Potassium: 3.7 mmol/L (ref 3.5–5.1)
Sodium: 140 mmol/L (ref 135–145)

## 2023-09-14 LAB — CBC
HCT: 28 % — ABNORMAL LOW (ref 39.0–52.0)
Hemoglobin: 7.1 g/dL — ABNORMAL LOW (ref 13.0–17.0)
MCH: 22 pg — ABNORMAL LOW (ref 26.0–34.0)
MCHC: 25.4 g/dL — ABNORMAL LOW (ref 30.0–36.0)
MCV: 86.7 fL (ref 80.0–100.0)
Platelets: 225 10*3/uL (ref 150–400)
RBC: 3.23 MIL/uL — ABNORMAL LOW (ref 4.22–5.81)
RDW: 22.2 % — ABNORMAL HIGH (ref 11.5–15.5)
WBC: 8.4 10*3/uL (ref 4.0–10.5)
nRBC: 0.8 % — ABNORMAL HIGH (ref 0.0–0.2)

## 2023-09-14 LAB — MAGNESIUM: Magnesium: 2.3 mg/dL (ref 1.7–2.4)

## 2023-09-14 NOTE — Plan of Care (Signed)
   Problem: Education: Goal: Knowledge of General Education information will improve Description: Including pain rating scale, medication(s)/side effects and non-pharmacologic comfort measures Outcome: Progressing   Problem: Clinical Measurements: Goal: Will remain free from infection Outcome: Progressing   Problem: Coping: Goal: Level of anxiety will decrease Outcome: Progressing

## 2023-09-14 NOTE — Progress Notes (Signed)
 PROGRESS NOTE    Frederick Brown  ZOX:[id number] DOB: Feb 05, 1951 DOA: 09/08/2023 PCP: Minus Amel, MD   Brief Narrative:    73 y.o. male with medical history significant for diastolic CHF, alcohol abuse, atrial fibrillation, hypertension, gout, morbid obesity.  Presented with generalized weakness, shortness of breath and worsening leg swelling.  He had not been compliant with his furosemide  at home.  Drinks about 2 glasses of vodka on a daily basis.  He was hospitalized for further management of volume overload and currently remains on Lasix  drip.   Assessment & Plan:   Principal Problem:   Acute on chronic diastolic (congestive) heart failure (HCC) Active Problems:   Alcohol abuse   Atrial fibrillation (HCC)   Gout   Morbid obesity (HCC)   Essential hypertension  Assessment and Plan:  Acute on chronic diastolic CHF/acute respiratory failure with hypoxia Pt admitted to noncomliance with diuretic  Last echo from 2018 showed LVEF of 55%. Repeat echo with normal LVEF but with evidence of vol overload Strict ins and outs and daily weights. Remains grossly vol overloaded Cardiology consulted 6/13 with recommendations to transition to Lasix  drip Follow-up labs in a.m. -Currently on 5 L nasal cannula oxygen and typically on room air at home - Patient has had -1960 mL fluid balance in the last 24 hours with net -7359 mL fluid balance.  Weights appear inaccurate   Alcohol abuse Counseled.  Pt claims that he has only 2 drinks every evening while cooking his meal.  Continue CIWA protocol thiamine  folate multivitamins.  No evidence for withdrawal currently.   Persistent atrial fibrillation Noted to be on metoprolol .  Borderline low blood pressures noted.  Will hold metoprolol  for today and decrease the dose from tomorrow. Anticoagulated with Xarelto .  Low hemoglobin noted.   Normocytic anemia-downtrending Hemoglobin of 7.1 noted.  No bleeding episodes.  Patient denies any black  stools.  Stool was Hemoccult negative.  Patient mentioned to Dr. Lyndon Santiago that he had a colonoscopy within the last 2 to 3 years and which was unremarkable. -On chart review, noted colonoscopy in 2009 with 3 tubular adenomas  Iron  level is low at 16 with sat of 4%. S/p dose of IV iron . On iron  supplementation -Follow-up a.m. CBC, may require transfusion by am if Hgb<7   Chronic kidney disease stage IIIa Renal function is stable with diuresis.  Avoid nephrotoxic agents. Monitor repeat labs each morning   Essential hypertension Monitor blood pressures closely.   History of gout Continue allopurinol .   Morbid obesity Estimated body mass index is 58.01 kg/m as calculated from the following:   Height as of this encounter: 5' 10 (1.778 m).   Weight as of this encounter: 183.4 kg.  DVT prophylaxis: Rivaroxaban  Code Status: Full Family Communication: Neighbor at bedside Disposition Plan:  Status is: Inpatient Remains inpatient appropriate because: Need for IV medications.   Skin Assessment:  I have examined the patient's skin and I agree with the wound assessment as performed by the wound care RN as outlined below:  Pressure Injury 09/08/23 Thigh Posterior;Right;Upper Stage 3 -  Full thickness tissue loss. Subcutaneous fat may be visible but bone, tendon or muscle are NOT exposed. (Active)  09/08/23 2120  Location: Thigh  Location Orientation: Posterior;Right;Upper  Staging: Stage 3 -  Full thickness tissue loss. Subcutaneous fat may be visible but bone, tendon or muscle are NOT exposed.  Wound Description (Comments):   DO NOT USE:  Present on Admission: Yes  Dressing Type Foam - Lift dressing  to assess site every shift 09/14/23 0800     Pressure Injury 09/08/23 Thigh Left;Posterior;Upper Stage 1 -  Intact skin with non-blanchable redness of a localized area usually over a bony prominence. (Active)  09/08/23 2120  Location: Thigh  Location Orientation: Left;Posterior;Upper   Staging: Stage 1 -  Intact skin with non-blanchable redness of a localized area usually over a bony prominence.  Wound Description (Comments):   DO NOT USE:  Present on Admission: Yes  Dressing Type Foam - Lift dressing to assess site every shift 09/14/23 0800    Consultants:  Cardiology  Procedures:  None  Antimicrobials:  None   Subjective: Patient seen and evaluated today with no new acute complaints or concerns. No acute concerns or events noted overnight.  Currently on 3.5 L nasal cannula and continues to diurese.  Objective: Vitals:   09/13/23 1500 09/13/23 2004 09/13/23 2251 09/14/23 0319  BP:  (!) 113/52 (!) 143/113 (!) 110/52  Pulse:  77 72 82  Resp:  20  20  Temp:  98.5 F (36.9 C)  98.7 F (37.1 C)  TempSrc:  Oral  Oral  SpO2: 98% 96%  96%  Weight:    (!) 185.2 kg  Height:        Intake/Output Summary (Last 24 hours) at 09/14/2023 1013 Last data filed at 09/14/2023 0330 Gross per 24 hour  Intake 240 ml  Output 1650 ml  Net -1410 ml   Filed Weights   09/12/23 0552 09/13/23 0500 09/14/23 0319  Weight: (!) 184.1 kg (!) 184.4 kg (!) 185.2 kg    Examination:  General exam: Appears calm and comfortable, morbidly obese Respiratory system: Clear to auscultation. Respiratory effort normal.  3.5 L nasal cannula oxygen. Cardiovascular system: S1 & S2 heard, RRR.  Gastrointestinal system: Abdomen is soft Central nervous system: Alert and awake Extremities: Bilateral lower extremity pitting edema Skin: No significant lesions noted Psychiatry: Flat affect.    Data Reviewed: I have personally reviewed following labs and imaging studies  CBC: Recent Labs  Lab 09/08/23 1719 09/10/23 0404 09/11/23 0432 09/12/23 0425 09/14/23 0235  WBC 6.9 8.2 7.9 8.5 8.4  NEUTROABS 5.3  --   --   --   --   HGB 7.6* 7.2* 7.6* 7.2* 7.1*  HCT 29.6* 29.0* 30.2* 29.5* 28.0*  MCV 83.4 84.1 85.1 83.6 86.7  PLT 300 260 256 257 225   Basic Metabolic Panel: Recent Labs   Lab 09/08/23 1719 09/09/23 0405 09/10/23 0404 09/11/23 0432 09/12/23 0425 09/14/23 0235  NA 143 145 144 143 145 140  K 3.7 4.0 3.8 3.7 4.0 3.7  CL 107 108 106 105 105 100  CO2 28 29 28 30 31  32  GLUCOSE 106* 110* 120* 114* 127* 111*  BUN 21 22 22 21 23 23   CREATININE 1.40* 1.37* 1.26* 1.29* 1.36* 1.21  CALCIUM  9.4 9.1 9.5 9.4 9.2 8.9  MG 2.4  --   --   --   --  2.3   GFR: Estimated Creatinine Clearance: 92 mL/min (by C-G formula based on SCr of 1.21 mg/dL). Liver Function Tests: Recent Labs  Lab 09/08/23 1719  AST 21  ALT 9  ALKPHOS 62  BILITOT 1.0  PROT 6.0*  ALBUMIN 3.0*   No results for input(s): LIPASE, AMYLASE in the last 168 hours. No results for input(s): AMMONIA in the last 168 hours. Coagulation Profile: No results for input(s): INR, PROTIME in the last 168 hours. Cardiac Enzymes: No results for input(s): CKTOTAL, CKMB, CKMBINDEX,  TROPONINI in the last 168 hours. BNP (last 3 results) No results for input(s): PROBNP in the last 8760 hours. HbA1C: No results for input(s): HGBA1C in the last 72 hours. CBG: No results for input(s): GLUCAP in the last 168 hours. Lipid Profile: No results for input(s): CHOL, HDL, LDLCALC, TRIG, CHOLHDL, LDLDIRECT in the last 72 hours. Thyroid  Function Tests: No results for input(s): TSH, T4TOTAL, FREET4, T3FREE, THYROIDAB in the last 72 hours. Anemia Panel: No results for input(s): VITAMINB12, FOLATE, FERRITIN, TIBC, IRON , RETICCTPCT in the last 72 hours. Sepsis Labs: Recent Labs  Lab 09/08/23 1719  LATICACIDVEN 1.0    No results found for this or any previous visit (from the past 240 hours).       Radiology Studies: No results found.      Scheduled Meds:  allopurinol   300 mg Oral Daily   docusate sodium   100 mg Oral BID   ferrous sulfate  325 mg Oral Q breakfast   folic acid   1 mg Oral Daily   leptospermum manuka honey  1 Application Topical Daily    melatonin  3 mg Oral QHS   metoprolol  tartrate  12.5 mg Oral BID   multivitamin with minerals  1 tablet Oral Daily   nystatin    Topical BID   pantoprazole   40 mg Oral Daily   rivaroxaban   20 mg Oral Daily   rosuvastatin   20 mg Oral Daily   thiamine   100 mg Oral Daily   Or   thiamine   100 mg Intravenous Daily   Continuous Infusions:  furosemide  (LASIX ) 200 mg in dextrose  5 % 100 mL (2 mg/mL) infusion 8 mg/hr (09/13/23 1235)     LOS: 6 days    Time spent: 55 minutes    Maleigha Colvard D Mason Sole, DO [facility]  If 7PM-7AM, please contact night-coverage [email or web address] 09/14/2023, 10:13 AM

## 2023-09-15 DIAGNOSIS — D649 Anemia, unspecified: Secondary | ICD-10-CM | POA: Diagnosis not present

## 2023-09-15 DIAGNOSIS — I4821 Permanent atrial fibrillation: Secondary | ICD-10-CM | POA: Diagnosis not present

## 2023-09-15 DIAGNOSIS — I5033 Acute on chronic diastolic (congestive) heart failure: Secondary | ICD-10-CM | POA: Diagnosis not present

## 2023-09-15 LAB — BASIC METABOLIC PANEL WITH GFR
Anion gap: 11 (ref 5–15)
BUN: 25 mg/dL — ABNORMAL HIGH (ref 8–23)
CO2: 34 mmol/L — ABNORMAL HIGH (ref 22–32)
Calcium: 9.4 mg/dL (ref 8.9–10.3)
Chloride: 99 mmol/L (ref 98–111)
Creatinine, Ser: 1.18 mg/dL (ref 0.61–1.24)
GFR, Estimated: >60 mL/min (ref >60)
Glucose, Bld: 115 mg/dL — ABNORMAL HIGH (ref 70–99)
Potassium: 3.5 mmol/L (ref 3.5–5.1)
Sodium: 144 mmol/L (ref 135–145)

## 2023-09-15 LAB — MAGNESIUM: Magnesium: 2.4 mg/dL (ref 1.7–2.4)

## 2023-09-15 LAB — CBC
HCT: 31 % — ABNORMAL LOW (ref 39.0–52.0)
Hemoglobin: 7.7 g/dL — ABNORMAL LOW (ref 13.0–17.0)
MCH: 21.4 pg — ABNORMAL LOW (ref 26.0–34.0)
MCHC: 24.8 g/dL — ABNORMAL LOW (ref 30.0–36.0)
MCV: 86.4 fL (ref 80.0–100.0)
Platelets: 213 10*3/uL (ref 150–400)
RBC: 3.59 MIL/uL — ABNORMAL LOW (ref 4.22–5.81)
RDW: 23 % — ABNORMAL HIGH (ref 11.5–15.5)
WBC: 7.7 10*3/uL (ref 4.0–10.5)
nRBC: 0.5 % — ABNORMAL HIGH (ref 0.0–0.2)

## 2023-09-15 NOTE — Progress Notes (Signed)
 Progress Note  Patient Name: Frederick Brown Date of Encounter: 09/15/2023  Primary Cardiologist: Janelle Mediate, MD  Subjective   Continues to be volume overloaded.  No symptoms.  Inpatient Medications    Scheduled Meds:  allopurinol   300 mg Oral Daily   docusate sodium   100 mg Oral BID   ferrous sulfate  325 mg Oral Q breakfast   folic acid   1 mg Oral Daily   leptospermum manuka honey  1 Application Topical Daily   melatonin  3 mg Oral QHS   metoprolol  tartrate  12.5 mg Oral BID   multivitamin with minerals  1 tablet Oral Daily   nystatin    Topical BID   pantoprazole   40 mg Oral Daily   rivaroxaban   20 mg Oral Daily   rosuvastatin   20 mg Oral Daily   thiamine   100 mg Oral Daily   Or   thiamine   100 mg Intravenous Daily   Continuous Infusions:  furosemide  (LASIX ) 200 mg in dextrose  5 % 100 mL (2 mg/mL) infusion 8 mg/hr (09/14/23 1455)   PRN Meds: acetaminophen  **OR** acetaminophen , guaiFENesin -dextromethorphan , ondansetron  **OR** ondansetron  (ZOFRAN ) IV, polyethylene glycol   Vital Signs    Vitals:   09/14/23 2028 09/14/23 2255 09/15/23 0615 09/15/23 0702  BP: (!) 107/46 (!) 105/58  (!) 136/53  Pulse: 85 80  100  Resp: 19     Temp: 98.9 F (37.2 C)   98.1 F (36.7 C)  TempSrc: Oral   Oral  SpO2: 92%   93%  Weight:   (!) 183.3 kg   Height:        Intake/Output Summary (Last 24 hours) at 09/15/2023 1002 Last data filed at 09/15/2023 0614 Gross per 24 hour  Intake 752 ml  Output 3300 ml  Net -2548 ml   Filed Weights   09/13/23 0500 09/14/23 0319 09/15/23 0615  Weight: (!) 184.4 kg (!) 185.2 kg (!) 183.3 kg    Telemetry     Personally reviewed.  A-fib.  ECG    Not performed today.  Physical Exam   GEN: No acute distress.   Neck: Unable to examine due to body habitus Cardiac: RRR, no murmur, rub, or gallop.  Respiratory: Nonlabored. Clear to auscultation bilaterally. GI: Soft, nontender, bowel sounds present. MS: 3-4+ pitting edema; No  deformity. Neuro:  Nonfocal. Psych: Alert and oriented x 3. Normal affect.  Labs    Chemistry Recent Labs  Lab 09/08/23 1719 09/09/23 0405 09/12/23 0425 09/14/23 0235 09/15/23 0329  NA 143   < > 145 140 144  K 3.7   < > 4.0 3.7 3.5  CL 107   < > 105 100 99  CO2 28   < > 31 32 34*  GLUCOSE 106*   < > 127* 111* 115*  BUN 21   < > 23 23 25*  CREATININE 1.40*   < > 1.36* 1.21 1.18  CALCIUM  9.4   < > 9.2 8.9 9.4  PROT 6.0*  --   --   --   --   ALBUMIN 3.0*  --   --   --   --   AST 21  --   --   --   --   ALT 9  --   --   --   --   ALKPHOS 62  --   --   --   --   BILITOT 1.0  --   --   --   --   [facility]  53*   < > 55* >60 >60  ANIONGAP 8   < > 9 8 11    < > = values in this interval not displayed.     Hematology Recent Labs  Lab 09/12/23 0425 09/14/23 0235 09/15/23 0329  WBC 8.5 8.4 7.7  RBC 3.53* 3.23* 3.59*  HGB 7.2* 7.1* 7.7*  HCT 29.5* 28.0* 31.0*  MCV 83.6 86.7 86.4  MCH 20.4* 22.0* 21.4*  MCHC 24.4* 25.4* 24.8*  RDW 21.5* 22.2* 23.0*  PLT 257 225 213    Cardiac Enzymes Recent Labs  Lab 09/08/23 1719 09/08/23 2128  TROPONINIHS 9 9    BNP Recent Labs  Lab 09/08/23 1719  BNP 875.0*     DDimerNo results for input(s): DDIMER in the last 168 hours.   Radiology    No results found.   Assessment & Plan    Acute on chronic diastolic heart failure/RV failure: Presented with DOE, abdominal distention and bilateral lower EXTR swelling x 3 months.  Noncompliant to home p.o. Lasix  40 mg TID.  Echo this admission showed normal LVEF, IV septum flattening in systole and diastole consistent with RV pressure and volume overload, moderate RV systolic dysfunction, mild enlargement of RV, moderate pulmonary hypertension, mild MR, moderate to severe TR and CVP 15 mmHg.  Currently on Lasix  drip 8 mg/h, continues to be grossly volume overloaded, will increase the drip to 12 mg/h.  He probably will need PICC line to guide IV diuresis in the next few days. heart  failure exacerbation likely exacerbated by acute anemia, s/p IV iron  infusion.  Permanent atrial fibrillation: Rate controlled, continue Lopressor  12.5 mg twice daily and Xarelto  20 mg daily for systemic AC. Hb 7.6 on admission ( Hb 11 around 1 year ago).  Severe symptomatic anemia: Hb 7.6 on admission (11 around 1 year ago).  No active bleeding.  Acute anemia likely exacerbated heart failure suppression.  Likely needs GI workup after volume optimized.   Signed, Jovane Foutz P Lorine Iannaccone, MD  09/15/2023, 10:02 AM

## 2023-09-15 NOTE — Plan of Care (Signed)
   Problem: Education: Goal: Knowledge of General Education information will improve Description: Including pain rating scale, medication(s)/side effects and non-pharmacologic comfort measures Outcome: Progressing   Problem: Clinical Measurements: Goal: Will remain free from infection Outcome: Progressing   Problem: Clinical Measurements: Goal: Respiratory complications will improve Outcome: Progressing

## 2023-09-15 NOTE — Progress Notes (Signed)
 PROGRESS NOTE    Frederick Brown  ZOX:[id number] DOB: 01-Oct-1950 DOA: 09/08/2023 PCP: Minus Amel, MD   Brief Narrative:    74 y.o. male with medical history significant for diastolic CHF, alcohol abuse, atrial fibrillation, hypertension, gout, morbid obesity.  Presented with generalized weakness, shortness of breath and worsening leg swelling.  He had not been compliant with his furosemide  at home.  Drinks about 2 glasses of vodka on a daily basis.  He was hospitalized for further management of volume overload and currently remains on Lasix  drip.   Assessment & Plan:   Principal Problem:   Acute on chronic diastolic (congestive) heart failure (HCC) Active Problems:   Alcohol abuse   Atrial fibrillation (HCC)   Gout   Morbid obesity (HCC)   Essential hypertension  Assessment and Plan:  Acute on chronic diastolic CHF/acute respiratory failure with hypoxia Pt admitted to noncomliance with diuretic  Last echo from 2018 showed LVEF of 55%. Repeat echo with normal LVEF but with evidence of vol overload Strict ins and outs and daily weights. Remains grossly vol overloaded Cardiology consulted 6/13 with recommendations to transition to Lasix  drip Follow-up labs in a.m. -Currently on 5 L nasal cannula oxygen and typically on room air at home - Continues to have aggressive diuresis and Lasix  drip increased to 12mg /h by cardiology on 6/16   Alcohol abuse Counseled.  Pt claims that he has only 2 drinks every evening while cooking his meal.  Continue CIWA protocol thiamine  folate multivitamins.  No evidence for withdrawal currently.   Persistent atrial fibrillation Noted to be on metoprolol .  Borderline low blood pressures noted.  Will hold metoprolol  for today and decrease the dose from tomorrow. Anticoagulated with Xarelto .  Low hemoglobin noted.   Normocytic anemia-downtrending Hemoglobin of 7.1 noted.  No bleeding episodes.  Patient denies any black stools.  Stool was Hemoccult  negative.  Patient mentioned to Dr. Lyndon Santiago that he had a colonoscopy within the last 2 to 3 years and which was unremarkable. -On chart review, noted colonoscopy in 2009 with 3 tubular adenomas  Iron  level is low at 16 with sat of 4%. S/p dose of IV iron . On iron  supplementation -Follow-up a.m. CBC, may require transfusion by am if Hgb<7   Chronic kidney disease stage IIIa Renal function is stable with diuresis.  Avoid nephrotoxic agents. Monitor repeat labs each morning   Essential hypertension Monitor blood pressures closely.   History of gout Continue allopurinol .   Morbid obesity Estimated body mass index is 58.01 kg/m as calculated from the following:   Height as of this encounter: 5' 10 (1.778 m).   Weight as of this encounter: 183.4 kg.  DVT prophylaxis: Rivaroxaban  Code Status: Full Family Communication: Neighbor at bedside Disposition Plan:  Status is: Inpatient Remains inpatient appropriate because: Need for IV medications.   Skin Assessment:  I have examined the patient's skin and I agree with the wound assessment as performed by the wound care RN as outlined below:  Pressure Injury 09/08/23 Thigh Posterior;Right;Upper Stage 3 -  Full thickness tissue loss. Subcutaneous fat may be visible but bone, tendon or muscle are NOT exposed. (Active)  09/08/23 2120  Location: Thigh  Location Orientation: Posterior;Right;Upper  Staging: Stage 3 -  Full thickness tissue loss. Subcutaneous fat may be visible but bone, tendon or muscle are NOT exposed.  Wound Description (Comments):   DO NOT USE:  Present on Admission: Yes  Dressing Type Honey;Foam - Lift dressing to assess site every shift 09/14/23 2100  Pressure Injury 09/08/23 Thigh Left;Posterior;Upper Stage 1 -  Intact skin with non-blanchable redness of a localized area usually over a bony prominence. (Active)  09/08/23 2120  Location: Thigh  Location Orientation: Left;Posterior;Upper  Staging: Stage 1 -  Intact  skin with non-blanchable redness of a localized area usually over a bony prominence.  Wound Description (Comments):   DO NOT USE:  Present on Admission: Yes  Dressing Type Foam - Lift dressing to assess site every shift 09/14/23 2100    Consultants:  Cardiology  Procedures:  None  Antimicrobials:  None   Subjective: Patient seen and evaluated today and is currently somnolent.  He appears to have ongoing aggressive diuresis.  No acute overnight events noted.  Objective: Vitals:   09/14/23 2028 09/14/23 2255 09/15/23 0615 09/15/23 0702  BP: (!) 107/46 (!) 105/58  (!) 136/53  Pulse: 85 80  100  Resp: 19     Temp: 98.9 F (37.2 C)   98.1 F (36.7 C)  TempSrc: Oral   Oral  SpO2: 92%   93%  Weight:   (!) 183.3 kg   Height:        Intake/Output Summary (Last 24 hours) at 09/15/2023 1021 Last data filed at 09/15/2023 8657 Gross per 24 hour  Intake 752 ml  Output 3300 ml  Net -2548 ml   Filed Weights   09/13/23 0500 09/14/23 0319 09/15/23 0615  Weight: (!) 184.4 kg (!) 185.2 kg (!) 183.3 kg    Examination:  General exam: Appears calm and comfortable, morbidly obese Respiratory system: Clear to auscultation. Respiratory effort normal.  3.5 L nasal cannula oxygen. Cardiovascular system: S1 & S2 heard, RRR.  Gastrointestinal system: Abdomen is soft Central nervous system: Alert and awake Extremities: Bilateral lower extremity pitting edema Skin: No significant lesions noted Psychiatry: Flat affect.    Data Reviewed: I have personally reviewed following labs and imaging studies  CBC: Recent Labs  Lab 09/08/23 1719 09/10/23 0404 09/11/23 0432 09/12/23 0425 09/14/23 0235 09/15/23 0329  WBC 6.9 8.2 7.9 8.5 8.4 7.7  NEUTROABS 5.3  --   --   --   --   --   HGB 7.6* 7.2* 7.6* 7.2* 7.1* 7.7*  HCT 29.6* 29.0* 30.2* 29.5* 28.0* 31.0*  MCV 83.4 84.1 85.1 83.6 86.7 86.4  PLT 300 260 256 257 225 213   Basic Metabolic Panel: Recent Labs  Lab 09/08/23 1719  09/09/23 0405 09/10/23 0404 09/11/23 0432 09/12/23 0425 09/14/23 0235 09/15/23 0329  NA 143   < > 144 143 145 140 144  K 3.7   < > 3.8 3.7 4.0 3.7 3.5  CL 107   < > 106 105 105 100 99  CO2 28   < > 28 30 31  32 34*  GLUCOSE 106*   < > 120* 114* 127* 111* 115*  BUN 21   < > 22 21 23 23  25*  CREATININE 1.40*   < > 1.26* 1.29* 1.36* 1.21 1.18  CALCIUM  9.4   < > 9.5 9.4 9.2 8.9 9.4  MG 2.4  --   --   --   --  2.3 2.4   < > = values in this interval not displayed.   GFR: Estimated Creatinine Clearance: 93.7 mL/min (by C-G formula based on SCr of 1.18 mg/dL). Liver Function Tests: Recent Labs  Lab 09/08/23 1719  AST 21  ALT 9  ALKPHOS 62  BILITOT 1.0  PROT 6.0*  ALBUMIN 3.0*   No results for input(s): LIPASE,  AMYLASE in the last 168 hours. No results for input(s): AMMONIA in the last 168 hours. Coagulation Profile: No results for input(s): INR, PROTIME in the last 168 hours. Cardiac Enzymes: No results for input(s): CKTOTAL, CKMB, CKMBINDEX, TROPONINI in the last 168 hours. BNP (last 3 results) No results for input(s): PROBNP in the last 8760 hours. HbA1C: No results for input(s): HGBA1C in the last 72 hours. CBG: No results for input(s): GLUCAP in the last 168 hours. Lipid Profile: No results for input(s): CHOL, HDL, LDLCALC, TRIG, CHOLHDL, LDLDIRECT in the last 72 hours. Thyroid  Function Tests: No results for input(s): TSH, T4TOTAL, FREET4, T3FREE, THYROIDAB in the last 72 hours. Anemia Panel: No results for input(s): VITAMINB12, FOLATE, FERRITIN, TIBC, IRON , RETICCTPCT in the last 72 hours. Sepsis Labs: Recent Labs  Lab 09/08/23 1719  LATICACIDVEN 1.0    No results found for this or any previous visit (from the past 240 hours).       Radiology Studies: No results found.      Scheduled Meds:  allopurinol   300 mg Oral Daily   docusate sodium   100 mg Oral BID   ferrous sulfate  325 mg Oral Q  breakfast   folic acid   1 mg Oral Daily   leptospermum manuka honey  1 Application Topical Daily   melatonin  3 mg Oral QHS   metoprolol  tartrate  12.5 mg Oral BID   multivitamin with minerals  1 tablet Oral Daily   nystatin    Topical BID   pantoprazole   40 mg Oral Daily   rivaroxaban   20 mg Oral Daily   rosuvastatin   20 mg Oral Daily   thiamine   100 mg Oral Daily   Or   thiamine   100 mg Intravenous Daily   Continuous Infusions:  furosemide  (LASIX ) 200 mg in dextrose  5 % 100 mL (2 mg/mL) infusion 12 mg/hr (09/15/23 1009)     LOS: 7 days    Time spent: 55 minutes    Sedrick Tober D Mason Sole, DO [facility]  If 7PM-7AM, please contact night-coverage [email or web address] 09/15/2023, 10:21 AM

## 2023-09-15 NOTE — Plan of Care (Signed)
   Problem: Education: Goal: Knowledge of General Education information will improve Description Including pain rating scale, medication(s)/side effects and non-pharmacologic comfort measures Outcome: Progressing   Problem: Health Behavior/Discharge Planning: Goal: Ability to manage health-related needs will improve Outcome: Progressing

## 2023-09-16 ENCOUNTER — Inpatient Hospital Stay (HOSPITAL_COMMUNITY)

## 2023-09-16 ENCOUNTER — Other Ambulatory Visit: Payer: Self-pay

## 2023-09-16 DIAGNOSIS — D649 Anemia, unspecified: Secondary | ICD-10-CM | POA: Diagnosis not present

## 2023-09-16 DIAGNOSIS — I4821 Permanent atrial fibrillation: Secondary | ICD-10-CM | POA: Diagnosis not present

## 2023-09-16 DIAGNOSIS — I5033 Acute on chronic diastolic (congestive) heart failure: Secondary | ICD-10-CM | POA: Diagnosis not present

## 2023-09-16 LAB — BASIC METABOLIC PANEL WITH GFR
Anion gap: 9 (ref 5–15)
BUN: 26 mg/dL — ABNORMAL HIGH (ref 8–23)
CO2: 37 mmol/L — ABNORMAL HIGH (ref 22–32)
Calcium: 9.2 mg/dL (ref 8.9–10.3)
Chloride: 99 mmol/L (ref 98–111)
Creatinine, Ser: 1.07 mg/dL (ref 0.61–1.24)
GFR, Estimated: >60 mL/min
Glucose, Bld: 128 mg/dL — ABNORMAL HIGH (ref 70–99)
Potassium: 3.5 mmol/L (ref 3.5–5.1)
Sodium: 145 mmol/L (ref 135–145)

## 2023-09-16 LAB — MAGNESIUM: Magnesium: 2.4 mg/dL (ref 1.7–2.4)

## 2023-09-16 MED ORDER — CHLORHEXIDINE GLUCONATE CLOTH 2 % EX PADS
6.0000 | MEDICATED_PAD | Freq: Every day | CUTANEOUS | Status: DC
  Administered 2023-09-16 – 2023-09-20 (×5): 6 via TOPICAL

## 2023-09-16 MED ORDER — SODIUM CHLORIDE 0.9% FLUSH: 10.0000 mL | INTRAVENOUS | Status: DC | PRN

## 2023-09-16 MED ORDER — ACETAZOLAMIDE SODIUM 500 MG IJ SOLR
500.0000 mg | Freq: Every day | INTRAMUSCULAR | Status: AC
  Administered 2023-09-16 – 2023-09-18 (×3): 500 mg via INTRAVENOUS
  Filled 2023-09-16 (×3): qty 500

## 2023-09-16 MED ORDER — SODIUM CHLORIDE 0.9% FLUSH
10.0000 mL | Freq: Two times a day (BID) | INTRAVENOUS | Status: DC
  Administered 2023-09-16 – 2023-09-23 (×13): 10 mL

## 2023-09-16 NOTE — Plan of Care (Signed)
   Problem: Education: Goal: Knowledge of General Education information will improve Description Including pain rating scale, medication(s)/side effects and non-pharmacologic comfort measures Outcome: Progressing   Problem: Health Behavior/Discharge Planning: Goal: Ability to manage health-related needs will improve Outcome: Progressing

## 2023-09-16 NOTE — Progress Notes (Signed)
 Progress Note  Patient Name: Frederick Brown Date of Encounter: 09/16/2023  Primary Cardiologist: Janelle Mediate, MD  Subjective   Continues to be short of breath.  Has leg swelling.  No other complaints.  Inpatient Medications    Scheduled Meds:  acetaZOLAMIDE  500 mg Intravenous Daily   allopurinol   300 mg Oral Daily   docusate sodium   100 mg Oral BID   ferrous sulfate  325 mg Oral Q breakfast   folic acid   1 mg Oral Daily   leptospermum manuka honey  1 Application Topical Daily   melatonin  3 mg Oral QHS   metoprolol  tartrate  12.5 mg Oral BID   multivitamin with minerals  1 tablet Oral Daily   nystatin    Topical BID   pantoprazole   40 mg Oral Daily   rivaroxaban   20 mg Oral Daily   rosuvastatin   20 mg Oral Daily   thiamine   100 mg Oral Daily   Or   thiamine   100 mg Intravenous Daily   Continuous Infusions:  furosemide  (LASIX ) 200 mg in dextrose  5 % 100 mL (2 mg/mL) infusion 12 mg/hr (09/15/23 1009)   PRN Meds: acetaminophen  **OR** acetaminophen , guaiFENesin -dextromethorphan , ondansetron  **OR** ondansetron  (ZOFRAN ) IV, polyethylene glycol   Vital Signs    Vitals:   09/15/23 1950 09/15/23 2150 09/15/23 2151 09/16/23 0434  BP: (!) 110/43 (!) 110/55 (!) 110/55 (!) 129/57  Pulse: 79  79 74  Resp: 20   20  Temp: (!) 97.5 F (36.4 C)   97.8 F (36.6 C)  TempSrc: Oral   Oral  SpO2: 96%   90%  Weight:    (!) 183.4 kg  Height:    5' 10 (1.778 m)    Intake/Output Summary (Last 24 hours) at 09/16/2023 1039 Last data filed at 09/16/2023 0634 Gross per 24 hour  Intake 431.04 ml  Output 1900 ml  Net -1468.96 ml   Filed Weights   09/14/23 0319 09/15/23 0615 09/16/23 0434  Weight: (!) 185.2 kg (!) 183.3 kg (!) 183.4 kg    Telemetry     Personally reviewed.  A-fib, rate controlled.  ECG    Not performed today.  Physical Exam   GEN: No acute distress.   Neck: Unable to examine due to body habitus Cardiac: RRR, no murmur, rub, or gallop.  Respiratory:  Nonlabored. Clear to auscultation bilaterally. GI: Soft, nontender, bowel sounds present. MS: 3-4+ pitting edema; No deformity. Neuro:  Nonfocal. Psych: Alert and oriented x 3. Normal affect.  Labs    Chemistry Recent Labs  Lab 09/14/23 0235 09/15/23 0329 09/16/23 0438  NA 140 144 145  K 3.7 3.5 3.5  CL 100 99 99  CO2 32 34* 37*  GLUCOSE 111* 115* 128*  BUN 23 25* 26*  CREATININE 1.21 1.18 1.07  CALCIUM  8.9 9.4 9.2  GFRNONAA >60 >60 >60  ANIONGAP 8 11 9      Hematology Recent Labs  Lab 09/12/23 0425 09/14/23 0235 09/15/23 0329  WBC 8.5 8.4 7.7  RBC 3.53* 3.23* 3.59*  HGB 7.2* 7.1* 7.7*  HCT 29.5* 28.0* 31.0*  MCV 83.6 86.7 86.4  MCH 20.4* 22.0* 21.4*  MCHC 24.4* 25.4* 24.8*  RDW 21.5* 22.2* 23.0*  PLT 257 225 213    Cardiac Enzymes Recent Labs  Lab 09/08/23 1719 09/08/23 2128  TROPONINIHS 9 9    BNP No results for input(s): BNP, PROBNP in the last 168 hours.    DDimerNo results for input(s): DDIMER in the last 168 hours.  Radiology    US  EKG SITE RITE Result Date: 09/16/2023 If Site Rite image not attached, placement could not be confirmed due to current cardiac rhythm.    Assessment & Plan    Acute on chronic diastolic heart failure/RV failure: Presented with DOE, abdominal distention and bilateral lower EXTR swelling x 3 months.  Noncompliant to home p.o. Lasix  40 mg TID.  Echo this admission showed normal LVEF, IV septum flattening in systole and diastole consistent with RV pressure and volume overload, moderate RV systolic dysfunction, mild enlargement of RV, moderate pulmonary hypertension, mild MR, moderate to severe TR and CVP 15 mmHg.  Lasix  drip was increased from 8 mg/h to 12 mg/h, 1.9L urine output in the last 24 hours with net -1.4 L.  Will continue Lasix  drip at 12 mg/h, obtain PICC line and add IV Diamox 500 mg daily for 3 doses. heart failure exacerbation likely exacerbated by acute anemia, s/p IV iron  infusion.  Permanent  atrial fibrillation: Rate controlled, continue Lopressor  12.5 mg twice daily, Xarelto  20 mg daily for systemic AC.  Hemoglobin 7.6 on admission (hemoglobin 11 around 1 year ago).  No active bleeding.  GI on board.  Severe symptomatic anemia: Hb 7.6 on admission (11 around 1 year ago).  No active bleeding.  GI on board.   Signed, Lasalle Pointer, MD  09/16/2023, 10:39 AM

## 2023-09-16 NOTE — Progress Notes (Signed)
 PROGRESS NOTE    Frederick Brown  NWG:[id number] DOB: Nov 09, 1950 DOA: 09/08/2023 PCP: Minus Amel, MD   Brief Narrative:    73 y.o. male with medical history significant for diastolic CHF, alcohol abuse, atrial fibrillation, hypertension, gout, morbid obesity.  Presented with generalized weakness, shortness of breath and worsening leg swelling.  He had not been compliant with his furosemide  at home.  Drinks about 2 glasses of vodka on a daily basis.  He was hospitalized for further management of volume overload and currently remains on Lasix  drip.  PICC line orders and Diamox orders placed per cardiology on 6/17.  He currently remains significantly volume overloaded, but is diuresing well.  Assessment & Plan:   Principal Problem:   Acute on chronic diastolic (congestive) heart failure (HCC) Active Problems:   Alcohol abuse   Atrial fibrillation (HCC)   Gout   Morbid obesity (HCC)   Essential hypertension  Assessment and Plan:  Acute on chronic diastolic CHF/acute respiratory failure with hypoxia Pt admitted to noncomliance with diuretic  Last echo from 2018 showed LVEF of 55%. Repeat echo with normal LVEF but with evidence of vol overload Strict ins and outs and daily weights. Remains grossly vol overloaded Cardiology consulted 6/13 with recommendations to transition to Lasix  drip Follow-up labs in a.m. -Currently on 3 L nasal cannula oxygen and typically on room air at home - Continues to have aggressive diuresis and Lasix  drip increased to 12mg /h by cardiology on 6/16 -PICC line orders and Diamox per cardiology 6/17   Alcohol abuse Counseled.  Pt claims that he has only 2 drinks every evening while cooking his meal.  Continue CIWA protocol thiamine  folate multivitamins.  No evidence for withdrawal currently.   Persistent atrial fibrillation Noted to be on metoprolol .  Borderline low blood pressures noted.  Will hold metoprolol  for today and decrease the dose from  tomorrow. Anticoagulated with Xarelto .  Low hemoglobin noted.   Normocytic anemia-downtrending Hemoglobin of 7.1 noted.  No bleeding episodes.  Patient denies any black stools.  Stool was Hemoccult negative.  Patient mentioned to Dr. Lyndon Santiago that he had a colonoscopy within the last 2 to 3 years and which was unremarkable. -On chart review, noted colonoscopy in 2009 with 3 tubular adenomas  Iron  level is low at 16 with sat of 4%. S/p dose of IV iron . On iron  supplementation -Follow-up a.m. CBC, may require transfusion by am if Hgb<7   Chronic kidney disease stage IIIa Renal function is stable with diuresis.  Avoid nephrotoxic agents. Monitor repeat labs each morning   Essential hypertension Monitor blood pressures closely.   History of gout Continue allopurinol .   Morbid obesity Estimated body mass index is 58.01 kg/m as calculated from the following:   Height as of this encounter: 5' 10 (1.778 m).   Weight as of this encounter: 183.4 kg.  DVT prophylaxis: Rivaroxaban  Code Status: Full Family Communication: None at bedside Disposition Plan:  Status is: Inpatient Remains inpatient appropriate because: Need for IV medications.   Skin Assessment:  I have examined the patient's skin and I agree with the wound assessment as performed by the wound care RN as outlined below:  Pressure Injury 09/08/23 Thigh Posterior;Right;Upper Stage 3 -  Full thickness tissue loss. Subcutaneous fat may be visible but bone, tendon or muscle are NOT exposed. (Active)  09/08/23 2120  Location: Thigh  Location Orientation: Posterior;Right;Upper  Staging: Stage 3 -  Full thickness tissue loss. Subcutaneous fat may be visible but bone, tendon or muscle are  NOT exposed.  Wound Description (Comments):   DO NOT USE:  Present on Admission: Yes  Dressing Type Foam - Lift dressing to assess site every shift 09/15/23 2150     Pressure Injury 09/08/23 Thigh Left;Posterior;Upper Stage 1 -  Intact skin with  non-blanchable redness of a localized area usually over a bony prominence. (Active)  09/08/23 2120  Location: Thigh  Location Orientation: Left;Posterior;Upper  Staging: Stage 1 -  Intact skin with non-blanchable redness of a localized area usually over a bony prominence.  Wound Description (Comments):   DO NOT USE:  Present on Admission: Yes  Dressing Type None 09/15/23 2150    Consultants:  Cardiology  Procedures:  None  Antimicrobials:  None   Subjective: Patient seen and evaluated today and is currently somnolent.  He is arousable, but continues to be short of breath and has leg swelling.  He overall is diuresing well.  Objective: Vitals:   09/15/23 1950 09/15/23 2150 09/15/23 2151 09/16/23 0434  BP: (!) 110/43 (!) 110/55 (!) 110/55 (!) 129/57  Pulse: 79  79 74  Resp: 20   20  Temp: (!) 97.5 F (36.4 C)   97.8 F (36.6 C)  TempSrc: Oral   Oral  SpO2: 96%   90%  Weight:    (!) 183.4 kg  Height:    5' 10 (1.778 m)    Intake/Output Summary (Last 24 hours) at 09/16/2023 1245 Last data filed at 09/16/2023 1240 Gross per 24 hour  Intake 731.04 ml  Output 2700 ml  Net -1968.96 ml   Filed Weights   09/14/23 0319 09/15/23 0615 09/16/23 0434  Weight: (!) 185.2 kg (!) 183.3 kg (!) 183.4 kg    Examination:  General exam: Appears calm and comfortable, morbidly obese Respiratory system: Clear to auscultation. Respiratory effort normal.  3.5 L nasal cannula oxygen. Cardiovascular system: S1 & S2 heard, RRR.  Gastrointestinal system: Abdomen is soft Central nervous system: Alert and awake Extremities: Bilateral lower extremity pitting edema Skin: No significant lesions noted Psychiatry: Flat affect.    Data Reviewed: I have personally reviewed following labs and imaging studies  CBC: Recent Labs  Lab 09/10/23 0404 09/11/23 0432 09/12/23 0425 09/14/23 0235 09/15/23 0329  WBC 8.2 7.9 8.5 8.4 7.7  HGB 7.2* 7.6* 7.2* 7.1* 7.7*  HCT 29.0* 30.2* 29.5* 28.0*  31.0*  MCV 84.1 85.1 83.6 86.7 86.4  PLT 260 256 257 225 213   Basic Metabolic Panel: Recent Labs  Lab 09/11/23 0432 09/12/23 0425 09/14/23 0235 09/15/23 0329 09/16/23 0438  NA 143 145 140 144 145  K 3.7 4.0 3.7 3.5 3.5  CL 105 105 100 99 99  CO2 30 31 32 34* 37*  GLUCOSE 114* 127* 111* 115* 128*  BUN 21 23 23  25* 26*  CREATININE 1.29* 1.36* 1.21 1.18 1.07  CALCIUM  9.4 9.2 8.9 9.4 9.2  MG  --   --  2.3 2.4 2.4   GFR: Estimated Creatinine Clearance: 103.4 mL/min (by C-G formula based on SCr of 1.07 mg/dL). Liver Function Tests: No results for input(s): AST, ALT, ALKPHOS, BILITOT, PROT, ALBUMIN in the last 168 hours.  No results for input(s): LIPASE, AMYLASE in the last 168 hours. No results for input(s): AMMONIA in the last 168 hours. Coagulation Profile: No results for input(s): INR, PROTIME in the last 168 hours. Cardiac Enzymes: No results for input(s): CKTOTAL, CKMB, CKMBINDEX, TROPONINI in the last 168 hours. BNP (last 3 results) No results for input(s): PROBNP in the last 8760 hours.  HbA1C: No results for input(s): HGBA1C in the last 72 hours. CBG: No results for input(s): GLUCAP in the last 168 hours. Lipid Profile: No results for input(s): CHOL, HDL, LDLCALC, TRIG, CHOLHDL, LDLDIRECT in the last 72 hours. Thyroid  Function Tests: No results for input(s): TSH, T4TOTAL, FREET4, T3FREE, THYROIDAB in the last 72 hours. Anemia Panel: No results for input(s): VITAMINB12, FOLATE, FERRITIN, TIBC, IRON , RETICCTPCT in the last 72 hours. Sepsis Labs: No results for input(s): PROCALCITON, LATICACIDVEN in the last 168 hours.   No results found for this or any previous visit (from the past 240 hours).       Radiology Studies: US  EKG SITE RITE Result Date: 09/16/2023 If Site Rite image not attached, placement could not be confirmed due to current cardiac rhythm.       Scheduled Meds:   acetaZOLAMIDE  500 mg Intravenous Daily   allopurinol   300 mg Oral Daily   docusate sodium   100 mg Oral BID   ferrous sulfate  325 mg Oral Q breakfast   folic acid   1 mg Oral Daily   leptospermum manuka honey  1 Application Topical Daily   melatonin  3 mg Oral QHS   metoprolol  tartrate  12.5 mg Oral BID   multivitamin with minerals  1 tablet Oral Daily   nystatin    Topical BID   pantoprazole   40 mg Oral Daily   rivaroxaban   20 mg Oral Daily   rosuvastatin   20 mg Oral Daily   thiamine   100 mg Oral Daily   Or   thiamine   100 mg Intravenous Daily   Continuous Infusions:  furosemide  (LASIX ) 200 mg in dextrose  5 % 100 mL (2 mg/mL) infusion 12 mg/hr (09/16/23 1149)     LOS: 8 days    Time spent: 55 minutes    Yandriel Boening D Mason Sole, DO [facility]  If 7PM-7AM, please contact night-coverage [email or web address] 09/16/2023, 12:45 PM

## 2023-09-16 NOTE — Plan of Care (Signed)
  Problem: Clinical Measurements: Goal: Cardiovascular complication will be avoided Outcome: Progressing   Problem: Activity: Goal: Risk for activity intolerance will decrease Outcome: Progressing   Problem: Pain Managment: Goal: General experience of comfort will improve and/or be controlled Outcome: Progressing   Problem: Safety: Goal: Ability to remain free from injury will improve Outcome: Progressing   Problem: Skin Integrity: Goal: Risk for impaired skin integrity will decrease Outcome: Progressing   Problem: Education: Goal: Ability to demonstrate management of disease process will improve Outcome: Progressing Goal: Ability to verbalize understanding of medication therapies will improve Outcome: Progressing Goal: Individualized Educational Video(s) Outcome: Progressing   Problem: Activity: Goal: Capacity to carry out activities will improve Outcome: Progressing   Problem: Cardiac: Goal: Ability to achieve and maintain adequate cardiopulmonary perfusion will improve Outcome: Progressing

## 2023-09-16 NOTE — Progress Notes (Signed)
 Peripherally Inserted Central Catheter Placement  The IV Nurse has discussed with the patient and/or persons authorized to consent for the patient, the purpose of this procedure and the potential benefits and risks involved with this procedure.  The benefits include less needle sticks, lab draws from the catheter, and the patient may be discharged home with the catheter. Risks include, but not limited to, infection, bleeding, blood clot (thrombus formation), and puncture of an artery; nerve damage and irregular heartbeat and possibility to perform a PICC exchange if needed/ordered by physician.  Alternatives to this procedure were also discussed.  Bard Power PICC patient education guide, fact sheet on infection prevention and patient information card has been provided to patient /or left at bedside.    PICC Placement Documentation  PICC Double Lumen 09/16/23 Right Basilic 50 cm 0 cm (Active)  Indication for Insertion or Continuance of Line Chronic illness with exacerbations (CF, Sickle Cell, etc.);Vasoactive infusions 09/16/23 1938  Exposed Catheter (cm) 0 cm 09/16/23 1938  Site Assessment Clean, Dry, Intact 09/16/23 1938  Lumen #1 Status Saline locked;Blood return noted 09/16/23 1938  Lumen #2 Status Saline locked;Blood return noted 09/16/23 1938  Dressing Type Transparent;Securing device 09/16/23 1938  Dressing Status Antimicrobial disc/dressing in place;Clean, Dry, Intact 09/16/23 1938  Line Care Connections checked and tightened 09/16/23 1938  Line Adjustment (NICU/IV Team Only) No 09/16/23 1938  Dressing Intervention New dressing 09/16/23 1938  Dressing Change Due 09/23/23 09/16/23 1938       Lulu Sales Chenice 09/16/2023, 7:38 PM

## 2023-09-16 NOTE — Progress Notes (Signed)
 Physical Therapy Treatment Patient Details Name: Frederick Brown MRN: [id number] DOB: [birth year] Today's Date: 09/16/2023   History of Present Illness Frederick Brown is a 73 y.o. male with medical history significant for diastolic CHF, alcohol abuse, atrial fibrillation, hypertension, gout, morbid obesity.  Patient presented to the ED with complaints of generalized weakness, and increased difficulty breathing with exertion with bilateral lower extremity swelling for the past 3 weeks, with abdominal distention.  He supposed to be on Lasix  40 mg TID but has not been compliant.  His alcohol daily-about 2 glasses of vodka daily.  He lives alone and has had difficulty ambulating (due to weakness, dyspnea and leg swelling) to get his alcohol beverages hence he has not had any in the past 3 to 4 days.  Denies hallucinations or shakes. He denies history of alcohol withdrawal.    PT Comments  Patient demonstrates slow labored movement for sitting up at bedside, had most difficulty scooting to EOB and c/o pain in BLE with pressue while assisted to EOB. Patient declined attempting sit to stands due to c/o weakness, SpO2 dropped from 93% to 85% while on 2 LPM and increased back to 4 LPM. Patient tolerated sitting up at bedside after therapy - nurse aware. Patient will benefit from continued skilled physical therapy in hospital and recommended venue below to increase strength, balance, endurance for safe ADLs and gait.      If plan is discharge home, recommend the following: A lot of help with bathing/dressing/bathroom;A lot of help with walking and/or transfers;Help with stairs or ramp for entrance;Assistance with cooking/housework   Can travel by private vehicle     No  Equipment Recommendations  None recommended by PT    Recommendations for Other Services       Precautions / Restrictions Precautions Precautions: Fall Recall of Precautions/Restrictions: Intact Restrictions Weight Bearing  Restrictions Per Provider Order: No     Mobility  Bed Mobility Overal bed mobility: Needs Assistance Bed Mobility: Supine to Sit     Supine to sit: Mod assist     General bed mobility comments: slow labored movement with most diffiuclty scooting to EOB    Transfers                        Ambulation/Gait                   Stairs             Wheelchair Mobility     Tilt Bed    Modified Rankin (Stroke Patients Only)       Balance Overall balance assessment: Needs assistance Sitting-balance support: Feet supported, No upper extremity supported Sitting balance-Leahy Scale: Fair Sitting balance - Comments: fair/good seated at EOB                                    Communication Communication Communication: No apparent difficulties  Cognition Arousal: Alert Behavior During Therapy: WFL for tasks assessed/performed, Anxious   PT - Cognitive impairments: No apparent impairments                         Following commands: Intact      Cueing Cueing Techniques: Verbal cues, Tactile cues  Exercises      General Comments        Pertinent Vitals/Pain Pain Assessment Pain Assessment: Faces Faces  Pain Scale: Hurts little more Pain Location: pressure to legs Pain Descriptors / Indicators: Discomfort, Sore, Grimacing Pain Intervention(s): Limited activity within patient's tolerance, Monitored during session, Repositioned    Home Living                          Prior Function            PT Goals (current goals can now be found in the care plan section) Acute Rehab PT Goals Patient Stated Goal: return home with neighbor, friends to assist PT Goal Formulation: With patient Time For Goal Achievement: 09/23/23 Potential to Achieve Goals: Good Progress towards PT goals: Progressing toward goals    Frequency    Min 3X/week      PT Plan      Co-evaluation              AM-PAC PT 6  Clicks Mobility   Outcome Measure  Help needed turning from your back to your side while in a flat bed without using bedrails?: A Lot Help needed moving from lying on your back to sitting on the side of a flat bed without using bedrails?: A Lot Help needed moving to and from a bed to a chair (including a wheelchair)?: Total Help needed standing up from a chair using your arms (e.g., wheelchair or bedside chair)?: Total Help needed to walk in hospital room?: A Lot Help needed climbing 3-5 steps with a railing? : Total 6 Click Score: 9    End of Session Equipment Utilized During Treatment: Oxygen Activity Tolerance: Patient tolerated treatment well;Patient limited by fatigue Patient left: Other (comment);with bed alarm set (patient left seated at EOB) Nurse Communication: Mobility status PT Visit Diagnosis: Unsteadiness on feet (R26.81);Other abnormalities of gait and mobility (R26.89);Muscle weakness (generalized) (M62.81)     Time: 0454-0981 PT Time Calculation (min) (ACUTE ONLY): 23 min  Charges:    $Therapeutic Activity: 23-37 mins PT General Charges $$ ACUTE PT VISIT: 1 Visit                     3:38 PM, 09/16/23 Walton Guppy, MPT Physical Therapist with [facility] [phone] office [phone] mobile phone

## 2023-09-17 DIAGNOSIS — D649 Anemia, unspecified: Secondary | ICD-10-CM | POA: Diagnosis not present

## 2023-09-17 DIAGNOSIS — I5033 Acute on chronic diastolic (congestive) heart failure: Secondary | ICD-10-CM | POA: Diagnosis not present

## 2023-09-17 DIAGNOSIS — I4821 Permanent atrial fibrillation: Secondary | ICD-10-CM | POA: Diagnosis not present

## 2023-09-17 DIAGNOSIS — I10 Essential (primary) hypertension: Secondary | ICD-10-CM | POA: Diagnosis not present

## 2023-09-17 DIAGNOSIS — L8990 Pressure ulcer of unspecified site, unspecified stage: Secondary | ICD-10-CM | POA: Insufficient documentation

## 2023-09-17 LAB — BASIC METABOLIC PANEL WITH GFR
Anion gap: 10 (ref 5–15)
BUN: 28 mg/dL — ABNORMAL HIGH (ref 8–23)
CO2: 35 mmol/L — ABNORMAL HIGH (ref 22–32)
Calcium: 9.3 mg/dL (ref 8.9–10.3)
Chloride: 100 mmol/L (ref 98–111)
Creatinine, Ser: 1.15 mg/dL (ref 0.61–1.24)
GFR, Estimated: >60 mL/min (ref >60)
Glucose, Bld: 112 mg/dL — ABNORMAL HIGH (ref 70–99)
Potassium: 3.4 mmol/L — ABNORMAL LOW (ref 3.5–5.1)
Sodium: 145 mmol/L (ref 135–145)

## 2023-09-17 LAB — CBC
HCT: 30.3 % — ABNORMAL LOW (ref 39.0–52.0)
Hemoglobin: 7.3 g/dL — ABNORMAL LOW (ref 13.0–17.0)
MCH: 21 pg — ABNORMAL LOW (ref 26.0–34.0)
MCHC: 24.1 g/dL — ABNORMAL LOW (ref 30.0–36.0)
MCV: 87.1 fL (ref 80.0–100.0)
Platelets: 204 10*3/uL (ref 150–400)
RBC: 3.48 MIL/uL — ABNORMAL LOW (ref 4.22–5.81)
RDW: 23.5 % — ABNORMAL HIGH (ref 11.5–15.5)
WBC: 6.6 10*3/uL (ref 4.0–10.5)
nRBC: 0.5 % — ABNORMAL HIGH (ref 0.0–0.2)

## 2023-09-17 LAB — MAGNESIUM: Magnesium: 2.6 mg/dL — ABNORMAL HIGH (ref 1.7–2.4)

## 2023-09-17 MED ORDER — POTASSIUM CHLORIDE CRYS ER 20 MEQ PO TBCR
40.0000 meq | EXTENDED_RELEASE_TABLET | Freq: Every day | ORAL | Status: DC
  Administered 2023-09-17 – 2023-09-18 (×2): 40 meq via ORAL
  Filled 2023-09-17 (×2): qty 2

## 2023-09-17 NOTE — Progress Notes (Signed)
 Orders received for CVP this morning,reached out to Roena Clark RN,AC for support, Plan of care on going.

## 2023-09-17 NOTE — Progress Notes (Signed)
 Progress Note  Patient Name: Frederick Brown Date of Encounter: 09/17/2023  Primary Cardiologist: Janelle Mediate, MD  Subjective   Somnolent but responding to all questions.  Continues to have leg swelling.  Inpatient Medications    Scheduled Meds:  acetaZOLAMIDE  500 mg Intravenous Daily   allopurinol   300 mg Oral Daily   Chlorhexidine  Gluconate Cloth  6 each Topical Daily   docusate sodium   100 mg Oral BID   ferrous sulfate  325 mg Oral Q breakfast   folic acid   1 mg Oral Daily   leptospermum manuka honey  1 Application Topical Daily   melatonin  3 mg Oral QHS   metoprolol  tartrate  12.5 mg Oral BID   multivitamin with minerals  1 tablet Oral Daily   nystatin    Topical BID   pantoprazole   40 mg Oral Daily   potassium chloride   40 mEq Oral Daily   rivaroxaban   20 mg Oral Daily   rosuvastatin   20 mg Oral Daily   sodium chloride  flush  10-40 mL Intracatheter Q12H   thiamine   100 mg Oral Daily   Or   thiamine   100 mg Intravenous Daily   Continuous Infusions:  furosemide  (LASIX ) 200 mg in dextrose  5 % 100 mL (2 mg/mL) infusion 12 mg/hr (09/17/23 0604)   PRN Meds: acetaminophen  **OR** acetaminophen , guaiFENesin -dextromethorphan , ondansetron  **OR** ondansetron  (ZOFRAN ) IV, polyethylene glycol, sodium chloride  flush   Vital Signs    Vitals:   09/16/23 2225 09/17/23 0500 09/17/23 0535 09/17/23 0831  BP: (!) 116/53  (!) 114/41 116/61  Pulse: 70  (!) 53 89  Resp:   20   Temp:   97.8 F (36.6 C) 98.1 F (36.7 C)  TempSrc:   Oral Oral  SpO2:   91% 93%  Weight:  (!) 181.8 kg    Height:        Intake/Output Summary (Last 24 hours) at 09/17/2023 0921 Last data filed at 09/17/2023 0915 Gross per 24 hour  Intake 966.59 ml  Output 1900 ml  Net -933.41 ml   Filed Weights   09/15/23 0615 09/16/23 0434 09/17/23 0500  Weight: (!) 183.3 kg (!) 183.4 kg (!) 181.8 kg    Telemetry     Personally reviewed.  A-fib, rate controlled.  ECG    Not performed  today.  Physical Exam   GEN: No acute distress.   Neck: Unable to examine due to body habitus Cardiac: RRR, no murmur, rub, or gallop.  Respiratory: Nonlabored. Clear to auscultation bilaterally. GI: Soft, nontender, bowel sounds present. MS: 3-4+ pitting edema; No deformity. Neuro:  Nonfocal. Psych: Alert and oriented x 3. Normal affect.  Labs    Chemistry Recent Labs  Lab 09/15/23 0329 09/16/23 0438 09/17/23 0419  NA 144 145 145  K 3.5 3.5 3.4*  CL 99 99 100  CO2 34* 37* 35*  GLUCOSE 115* 128* 112*  BUN 25* 26* 28*  CREATININE 1.18 1.07 1.15  CALCIUM  9.4 9.2 9.3  GFRNONAA >60 >60 >60  ANIONGAP 11 9 10      Hematology Recent Labs  Lab 09/14/23 0235 09/15/23 0329 09/17/23 0419  WBC 8.4 7.7 6.6  RBC 3.23* 3.59* 3.48*  HGB 7.1* 7.7* 7.3*  HCT 28.0* 31.0* 30.3*  MCV 86.7 86.4 87.1  MCH 22.0* 21.4* 21.0*  MCHC 25.4* 24.8* 24.1*  RDW 22.2* 23.0* 23.5*  PLT 225 213 204    Cardiac Enzymes Recent Labs  Lab 09/08/23 1719 09/08/23 2128  TROPONINIHS 9 9  BNP No results for input(s): BNP, PROBNP in the last 168 hours.    DDimerNo results for input(s): DDIMER in the last 168 hours.   Radiology    DG CHEST PORT 1 VIEW Result Date: 09/16/2023 CLINICAL DATA:  PICC placement EXAM: PORTABLE CHEST 1 VIEW COMPARISON:  09/08/2023 FINDINGS: Single frontal view of the chest demonstrates right-sided PICC tip overlying the atriocaval junction. Cardiac silhouette remains enlarged. Persistent vascular congestion. Costophrenic angles are excluded by collimation. No pneumothorax. IMPRESSION: 1. Right-sided PICC tip overlying atriocaval junction. 2. Stable enlarged cardiac silhouette and pulmonary vascular congestion. Electronically Signed   By: Bobbye Burrow M.D.   On: 09/16/2023 20:09   US  EKG SITE RITE Result Date: 09/16/2023 If Site Rite image not attached, placement could not be confirmed due to current cardiac rhythm.    Assessment & Plan    Acute on  chronic diastolic heart failure/RV failure: Presented with DOE, abdominal distention and bilateral lower EXTR swelling x 3 months.  Noncompliant to home p.o. Lasix  40 mg TID.  Echo this admission showed normal LVEF, IV septum flattening in systole and diastole consistent with RV pressure and volume overload, moderate RV systolic dysfunction, mild enlargement of RV, moderate pulmonary hypertension, mild MR, moderate to severe TR and CVP 15 mmHg.  1.9L urine output in the last 24 hours with net -0.9 L on Lasix  drip 12 mg/h and IV Diamox 500 mg.  PICC line placed yesterday and CVP obtained this morning was 16 mmHg.  Will increase Lasix  drip from 12 mg/h to 15 mg/h and continue IV Diamox 500 mg today and tomorrow. heart failure exacerbation likely exacerbated by acute anemia, s/p IV iron  infusion.  Permanent atrial fibrillation: Rate controlled, continue Lopressor  2.5 mg twice daily, Xarelto  20 mg daily for systemic AC.  Hemoglobin 7.6 on admission (hemoglobin 11 around 1 year ago).  No active bleeding.  GI on board.  Severe symptomatic anemia: Hb 7.6 on admission (11 around 1 year ago).  No active bleeding.  GI on board.   Signed, Lasalle Pointer, MD  09/17/2023, 9:21 AM

## 2023-09-17 NOTE — Progress Notes (Signed)
 Nurse at bedside,patient's blood pressure was 102/45,heart rate 63,Dr Lincoln Renshaw notified. Plan of care on going.

## 2023-09-17 NOTE — Care Management Important Message (Signed)
 Important Message  Patient Details  Name: Frederick Brown MRN: [id number] Date of Birth: Jun 08, 1950   Important Message Given:  Yes - Medicare IM     [name] 09/17/2023, 11:22 AM

## 2023-09-17 NOTE — Plan of Care (Signed)
   Problem: Education: Goal: Knowledge of General Education information will improve Description Including pain rating scale, medication(s)/side effects and non-pharmacologic comfort measures Outcome: Progressing   Problem: Education: Goal: Knowledge of General Education information will improve Description Including pain rating scale, medication(s)/side effects and non-pharmacologic comfort measures Outcome: Progressing

## 2023-09-17 NOTE — Progress Notes (Signed)
   09/17/23 1500  ReDS Vest / Clip  Station Marker D  Ruler Value 40  ReDS Value Range 36 - 40  ReDS Actual Value 36

## 2023-09-17 NOTE — Plan of Care (Signed)

## 2023-09-17 NOTE — Progress Notes (Signed)
 PROGRESS NOTE   Frederick Brown  ZOX:[id number] DOB: 1950-07-03 DOA: 09/08/2023 PCP: Minus Amel, MD   Chief Complaint  Patient presents with   Weakness   Level of care: Telemetry  Brief Admission History:  73 y.o. male with medical history significant for diastolic CHF, alcohol abuse, atrial fibrillation, hypertension, gout, morbid obesity.  Presented with generalized weakness, shortness of breath and worsening leg swelling.  He had not been compliant with his furosemide  at home.  Drinks about 2 glasses of vodka on a daily basis.  He was hospitalized for further management of volume overload.    Assessment and Plan:  Acute on chronic diastolic CHF/acute respiratory failure with hypoxia Pt admitted to noncomliance with diuretic Last echo from 2018 showed LVEF of 55%. Repeated echo with normal LVEF but with evidence of volume overload Strict ins and outs and daily weights. Filed Weights   09/15/23 0615 09/16/23 0434 09/17/23 0500  Weight: (!) 183.3 kg (!) 183.4 kg (!) 181.8 kg    Intake/Output Summary (Last 24 hours) at 09/17/2023 1032 Last data filed at 09/17/2023 0915 Gross per 24 hour  Intake 966.59 ml  Output 1900 ml  Net -933.41 ml   CVP is 16   Remains grossly vol overloaded Cardiology consulted 6/13 with recommendations to transition to Lasix  drip -Currently on 3 L nasal cannula oxygen and typically on room air at home - Continues to have aggressive diuresis and Lasix  drip increased to 12mg /h by cardiology on 6/16 -PICC line orders and Diamox per cardiology 6/17 -CVP being monitored by cardiology team now that we have PICC line placed    Alcohol abuse Counseled.  Pt claims that he has only 2 drinks every evening while cooking his meal.  Continue CIWA protocol thiamine  folate multivitamins.  No evidence for withdrawal currently.   Persistent atrial fibrillation Noted to be on metoprolol .  Borderline low blood pressures noted.  Will hold metoprolol  for today and  decrease the dose from tomorrow. Anticoagulated with Xarelto .  Low hemoglobin noted.   Normocytic anemia-downtrending Hemoglobin of 7.1 noted.  No bleeding episodes.  Patient denies any black stools.  Stool was Hemoccult negative.  Patient mentioned to Dr. Lyndon Brown that he had a colonoscopy within the last 2 to 3 years and which was unremarkable. -On chart review, noted colonoscopy in 2009 with 3 tubular adenomas  Iron  level is low at 16 with sat of 4%. S/p dose of IV iron . On iron  supplementation -Follow-up a.m. CBC, may require transfusion by am if Hgb<7   Chronic kidney disease stage IIIa Renal function is stable with diuresis.  Avoid nephrotoxic agents. Monitor repeat labs each morning   Essential hypertension Monitor blood pressures closely.   History of gout Continue allopurinol .   Morbid obesity Estimated body mass index is 58.01 kg/m as calculated from the following:   Height as of this encounter: 5' 10 (1.778 m).   Weight as of this encounter: 183.4 kg.   DVT prophylaxis: rivaroxaban  Code Status: Full  Family Communication:  Disposition: refusing SNF    Consultants:  Cardiology  Procedures:  PICC line placement   Antimicrobials:    Subjective: Pt somnolent today; not sure what his baseline has been;   Objective: Vitals:   09/16/23 2225 09/17/23 0500 09/17/23 0535 09/17/23 0831  BP: (!) 116/53  (!) 114/41 116/61  Pulse: 70  (!) 53 89  Resp:   20   Temp:   97.8 F (36.6 C) 98.1 F (36.7 C)  TempSrc:   Oral Oral  SpO2:   91% 93%  Weight:  (!) 181.8 kg    Height:        Intake/Output Summary (Last 24 hours) at 09/17/2023 1030 Last data filed at 09/17/2023 0915 Gross per 24 hour  Intake 966.59 ml  Output 1900 ml  Net -933.41 ml   Filed Weights   09/15/23 0615 09/16/23 0434 09/17/23 0500  Weight: (!) 183.3 kg (!) 183.4 kg (!) 181.8 kg   Examination:  General exam: Appears calm and comfortable  Respiratory system: Clear to auscultation.  Respiratory effort normal. Cardiovascular system: normal S1 & S2 heard. No JVD, murmurs, rubs, gallops or clicks. No pedal edema. Gastrointestinal system: Abdomen is nondistended, soft and nontender. No organomegaly or masses felt. Normal bowel sounds heard. Central nervous system: Alert and oriented. No focal neurological deficits. Extremities: Symmetric 5 x 5 power. Skin: No rashes, lesions or ulcers. Psychiatry: Judgement and insight appear normal. Mood & affect appropriate.   Data Reviewed: I have personally reviewed following labs and imaging studies  CBC: Recent Labs  Lab 09/11/23 0432 09/12/23 0425 09/14/23 0235 09/15/23 0329 09/17/23 0419  WBC 7.9 8.5 8.4 7.7 6.6  HGB 7.6* 7.2* 7.1* 7.7* 7.3*  HCT 30.2* 29.5* 28.0* 31.0* 30.3*  MCV 85.1 83.6 86.7 86.4 87.1  PLT 256 257 225 213 204    Basic Metabolic Panel: Recent Labs  Lab 09/12/23 0425 09/14/23 0235 09/15/23 0329 09/16/23 0438 09/17/23 0419  NA 145 140 144 145 145  K 4.0 3.7 3.5 3.5 3.4*  CL 105 100 99 99 100  CO2 31 32 34* 37* 35*  GLUCOSE 127* 111* 115* 128* 112*  BUN 23 23 25* 26* 28*  CREATININE 1.36* 1.21 1.18 1.07 1.15  CALCIUM  9.2 8.9 9.4 9.2 9.3  MG  --  2.3 2.4 2.4 2.6*    CBG: No results for input(s): GLUCAP in the last 168 hours.  No results found for this or any previous visit (from the past 240 hours).   Radiology Studies: DG CHEST PORT 1 VIEW Result Date: 09/16/2023 CLINICAL DATA:  PICC placement EXAM: PORTABLE CHEST 1 VIEW COMPARISON:  09/08/2023 FINDINGS: Single frontal view of the chest demonstrates right-sided PICC tip overlying the atriocaval junction. Cardiac silhouette remains enlarged. Persistent vascular congestion. Costophrenic angles are excluded by collimation. No pneumothorax. IMPRESSION: 1. Right-sided PICC tip overlying atriocaval junction. 2. Stable enlarged cardiac silhouette and pulmonary vascular congestion. Electronically Signed   By: Bobbye Burrow M.D.   On: 09/16/2023  20:09   US  EKG SITE RITE Result Date: 09/16/2023 If Site Rite image not attached, placement could not be confirmed due to current cardiac rhythm.   Scheduled Meds:  acetaZOLAMIDE  500 mg Intravenous Daily   allopurinol   300 mg Oral Daily   Chlorhexidine  Gluconate Cloth  6 each Topical Daily   docusate sodium   100 mg Oral BID   ferrous sulfate  325 mg Oral Q breakfast   folic acid   1 mg Oral Daily   leptospermum manuka honey  1 Application Topical Daily   melatonin  3 mg Oral QHS   metoprolol  tartrate  12.5 mg Oral BID   multivitamin with minerals  1 tablet Oral Daily   nystatin    Topical BID   pantoprazole   40 mg Oral Daily   potassium chloride   40 mEq Oral Daily   rivaroxaban   20 mg Oral Daily   rosuvastatin   20 mg Oral Daily   sodium chloride  flush  10-40 mL Intracatheter Q12H   thiamine   100  mg Oral Daily   Or   thiamine   100 mg Intravenous Daily   Continuous Infusions:  furosemide  (LASIX ) 200 mg in dextrose  5 % 100 mL (2 mg/mL) infusion 15 mg/hr (09/17/23 0948)     LOS: 9 days   Time spent: 55 mins  Frederick Shank Lincoln Renshaw, MD How to contact the [facility] Attending or Consulting provider 7A - 7P or covering provider during after hours 7P -7A, for this patient?  Check the care team in [facility] and look for a) attending/consulting TRH provider listed and b) the TRH team listed Log into [email or web address] to find provider on call.  Locate the TRH provider you are looking for under [facility] and page to a number that you can be directly reached. If you still have difficulty reaching the provider, please page the [facility] (Director on Call) for the Hospitalists listed on amion for assistance.  09/17/2023, 10:30 AM

## 2023-09-17 NOTE — Progress Notes (Signed)
 Physical Therapy Treatment Patient Details Name: Frederick Brown MRN: [id number] DOB: [birth year] Today's Date: 09/17/2023   History of Present Illness Frederick Brown is a 73 y.o. male with medical history significant for diastolic CHF, alcohol abuse, atrial fibrillation, hypertension, gout, morbid obesity.  Patient presented to the ED with complaints of generalized weakness, and increased difficulty breathing with exertion with bilateral lower extremity swelling for the past 3 weeks, with abdominal distention.  He supposed to be on Lasix  40 mg TID but has not been compliant.  His alcohol daily-about 2 glasses of vodka daily.  He lives alone and has had difficulty ambulating (due to weakness, dyspnea and leg swelling) to get his alcohol beverages hence he has not had any in the past 3 to 4 days.  Denies hallucinations or shakes. He denies history of alcohol withdrawal.    PT Comments  Patient agreeable for therapy, presents on 4 LPM O2 with SpO2 at 92%.  Patient demonstrates slow labored movement for sitting up at bedside requiring frequent rest breaks due to fatigue and mild difficulty breathing.  Patient demonstrates fair/good return for completing BLE ROM/strengthening exercises while seated at EOB, unable to attempt sit to stands due to c/o fatigue and tolerated sitting up at bedside with tray in front after therapy - nurse notified. Patient will benefit from continued skilled physical therapy in hospital and recommended venue below to increase strength, balance, endurance for safe ADLs and gait.       If plan is discharge home, recommend the following: A lot of help with bathing/dressing/bathroom;A lot of help with walking and/or transfers;Help with stairs or ramp for entrance;Assistance with cooking/housework   Can travel by private vehicle     No  Equipment Recommendations  None recommended by PT    Recommendations for Other Services       Precautions / Restrictions  Precautions Precautions: Fall Recall of Precautions/Restrictions: Intact Restrictions Weight Bearing Restrictions Per Provider Order: No     Mobility  Bed Mobility Overal bed mobility: Needs Assistance Bed Mobility: Supine to Sit     Supine to sit: Mod assist     General bed mobility comments: increased time, labored movement, less pain in BLE with pressure    Transfers                        Ambulation/Gait                   Stairs             Wheelchair Mobility     Tilt Bed    Modified Rankin (Stroke Patients Only)       Balance Overall balance assessment: Needs assistance Sitting-balance support: Feet supported, No upper extremity supported Sitting balance-Leahy Scale: Fair Sitting balance - Comments: fair/good seated at EOB                                    Communication Communication Communication: No apparent difficulties  Cognition Arousal: Alert Behavior During Therapy: WFL for tasks assessed/performed, Anxious   PT - Cognitive impairments: No apparent impairments                         Following commands: Intact      Cueing Cueing Techniques: Verbal cues, Tactile cues  Exercises General Exercises - Lower Extremity Long Arc Quad: Seated, AROM,  Strengthening, Both, 10 reps Hip Flexion/Marching: Seated, AROM, Strengthening, Both, 10 reps Toe Raises: Seated, AROM, Strengthening, Both, 10 reps Heel Raises: Seated, AROM, Strengthening, Both, 10 reps    General Comments        Pertinent Vitals/Pain Pain Assessment Pain Assessment: Faces Faces Pain Scale: Hurts a little bit Pain Location: BLE Pain Descriptors / Indicators: Discomfort, Sore Pain Intervention(s): Limited activity within patient's tolerance, Monitored during session, Repositioned    Home Living                          Prior Function            PT Goals (current goals can now be found in the care plan  section) Acute Rehab PT Goals Patient Stated Goal: return home with neighbor, friends to assist PT Goal Formulation: With patient Time For Goal Achievement: 09/23/23 Potential to Achieve Goals: Good Progress towards PT goals: Progressing toward goals    Frequency    Min 3X/week      PT Plan      Co-evaluation              AM-PAC PT 6 Clicks Mobility   Outcome Measure  Help needed turning from your back to your side while in a flat bed without using bedrails?: A Lot Help needed moving from lying on your back to sitting on the side of a flat bed without using bedrails?: A Lot Help needed moving to and from a bed to a chair (including a wheelchair)?: Total Help needed standing up from a chair using your arms (e.g., wheelchair or bedside chair)?: Total Help needed to walk in hospital room?: Total   6 Click Score: 7    End of Session Equipment Utilized During Treatment: Oxygen Activity Tolerance: Patient tolerated treatment well;Patient limited by fatigue Patient left: in bed;Other (comment) (Patient left seated at bedside with tray in front) Nurse Communication: Mobility status PT Visit Diagnosis: Unsteadiness on feet (R26.81);Other abnormalities of gait and mobility (R26.89);Muscle weakness (generalized) (M62.81)     Time: 5621-3086 PT Time Calculation (min) (ACUTE ONLY): 27 min  Charges:    $Therapeutic Exercise: 8-22 mins $Therapeutic Activity: 8-22 mins PT General Charges $$ ACUTE PT VISIT: 1 Visit                     3:51 PM, 09/17/23 Walton Guppy, MPT Physical Therapist with [facility] [phone] office [phone] mobile phone

## 2023-09-17 NOTE — Progress Notes (Signed)
 CVP 16 mm/hg; MD notified

## 2023-09-18 DIAGNOSIS — D649 Anemia, unspecified: Secondary | ICD-10-CM | POA: Diagnosis not present

## 2023-09-18 DIAGNOSIS — I5033 Acute on chronic diastolic (congestive) heart failure: Secondary | ICD-10-CM | POA: Diagnosis not present

## 2023-09-18 DIAGNOSIS — I4821 Permanent atrial fibrillation: Secondary | ICD-10-CM | POA: Diagnosis not present

## 2023-09-18 DIAGNOSIS — F1010 Alcohol abuse, uncomplicated: Secondary | ICD-10-CM | POA: Diagnosis not present

## 2023-09-18 LAB — CBC
HCT: 31.8 % — ABNORMAL LOW (ref 39.0–52.0)
Hemoglobin: 8.1 g/dL — ABNORMAL LOW (ref 13.0–17.0)
MCH: 22.3 pg — ABNORMAL LOW (ref 26.0–34.0)
MCHC: 25.5 g/dL — ABNORMAL LOW (ref 30.0–36.0)
MCV: 87.4 fL (ref 80.0–100.0)
Platelets: 202 10*3/uL (ref 150–400)
RBC: 3.64 MIL/uL — ABNORMAL LOW (ref 4.22–5.81)
RDW: 23.6 % — ABNORMAL HIGH (ref 11.5–15.5)
WBC: 5.9 10*3/uL (ref 4.0–10.5)
nRBC: 0.3 % — ABNORMAL HIGH (ref 0.0–0.2)

## 2023-09-18 LAB — BASIC METABOLIC PANEL WITH GFR
Anion gap: 9 (ref 5–15)
BUN: 29 mg/dL — ABNORMAL HIGH (ref 8–23)
CO2: 33 mmol/L — ABNORMAL HIGH (ref 22–32)
Calcium: 9.3 mg/dL (ref 8.9–10.3)
Chloride: 99 mmol/L (ref 98–111)
Creatinine, Ser: 1.16 mg/dL (ref 0.61–1.24)
GFR, Estimated: >60 mL/min (ref >60)
Glucose, Bld: 137 mg/dL — ABNORMAL HIGH (ref 70–99)
Potassium: 3.2 mmol/L — ABNORMAL LOW (ref 3.5–5.1)
Sodium: 141 mmol/L (ref 135–145)

## 2023-09-18 MED ORDER — POTASSIUM CHLORIDE 10 MEQ/100ML IV SOLN
10.0000 meq | INTRAVENOUS | Status: AC
  Administered 2023-09-18 (×4): 10 meq via INTRAVENOUS
  Filled 2023-09-18 (×4): qty 100

## 2023-09-18 MED ORDER — POTASSIUM CHLORIDE CRYS ER 20 MEQ PO TBCR
40.0000 meq | EXTENDED_RELEASE_TABLET | Freq: Two times a day (BID) | ORAL | Status: DC
  Administered 2023-09-18 – 2023-09-23 (×10): 40 meq via ORAL
  Filled 2023-09-18 (×10): qty 2

## 2023-09-18 NOTE — Progress Notes (Signed)
 Nurse at bedside,patient irritable,c/o about not having enough to drink.Patient informed again that he was on a fluid restriction,patient was educated,that we have to accurately measure and record all fluid intake.Patient was given the pitcher in the room and instructed that this was about 66 ounces,so he can have one of those pitchers and a cup would about equal what he can have in a full day,patient verbalized understanding.Patient was not happy about this at all. Plan of care on going.

## 2023-09-18 NOTE — Progress Notes (Signed)
 PROGRESS NOTE   Frederick Brown  ZOX:[id number] DOB: [birth year] DOA: 09/08/2023 PCP: Minus Amel, MD   Chief Complaint  Patient presents with   Weakness   Level of care: Telemetry  Brief Admission History:  73 y.o. male with medical history significant for diastolic CHF, alcohol abuse, atrial fibrillation, hypertension, gout, morbid obesity.  Presented with generalized weakness, shortness of breath and worsening leg swelling.  He had not been compliant with his furosemide  at home.  Drinks about 2 glasses of vodka on a daily basis.  He was hospitalized for further management of volume overload.    Assessment and Plan:  Acute on chronic diastolic CHF/acute respiratory failure with hypoxia Pt admitted to noncomliance with diuretic Last echo from 2018 showed LVEF of 55%. Repeated echo with normal LVEF but with evidence of volume overload Strict ins and outs and daily weights. -Pt has not been compliant with fluid restriction  Filed Weights   09/16/23 0434 09/17/23 0500 09/18/23 0301  Weight: (!) 183.4 kg (!) 181.8 kg (!) 180.7 kg    Intake/Output Summary (Last 24 hours) at 09/18/2023 1210 Last data filed at 09/18/2023 1150 Gross per 24 hour  Intake 1356.65 ml  Output 3100 ml  Net -1743.35 ml   CVP is 13 today  Remains grossly vol overloaded but CVP coming down with diuresis  Cardiology consulted 6/13 with recommendations to transition to Lasix  drip which he remains on at this time - Currently on 4 L nasal cannula oxygen and typically on room air at home - Continues to have aggressive diuresis and Lasix  drip increased to 15 mg/h by cardiology on 6/18 -PICC line in place and Diamox per cardiology given 6/17-6/19 -CVP being monitored by cardiology team now that we have PICC line placed    Alcohol abuse Counseled.  Pt claims that he has only 2 drinks every evening while cooking his meal.  Continue CIWA protocol thiamine  folate multivitamins.  No evidence for withdrawal currently.    Persistent atrial fibrillation Noted to be on metoprolol .  Borderline low blood pressures noted.  Will hold metoprolol  for today and decrease the dose from tomorrow. Anticoagulated with Xarelto .  Low hemoglobin noted.   Normocytic anemia Hemoglobin of 8.1 today.  No bleeding episodes.  Patient denies any black stools.  Stool was Hemoccult negative.  Patient reported that he had a colonoscopy within the last 2 to 3 years and which was unremarkable. -On chart review, noted colonoscopy in 2009 with 3 tubular adenomas  Iron  level is low at 16 with sat of 4%. S/p dose of IV iron . On iron  supplementation -Follow-up a.m. CBC, may require transfusion by am if Hgb<7   Chronic kidney disease stage IIIa Renal function is stable with diuresis.  Avoid nephrotoxic agents. Monitor repeat labs each morning   Essential hypertension Monitor blood pressures closely.   History of gout Continue allopurinol .   Morbid obesity Estimated body mass index is 58.01 kg/m as calculated from the following:   Height as of this encounter: 5' 10 (1.778 m).   Weight as of this encounter: 183.4 kg.   DVT prophylaxis: rivaroxaban  Code Status: Full  Communication: discussed plan of care with patient who verbalized understanding Disposition: refusing SNF    Consultants:  Cardiology   Procedures:  PICC line placement   Antimicrobials:    Subjective: Pt says he has been having episodes of intermittent confusion in hospital and he is unsure about rehab  Objective: Vitals:   09/17/23 1521 09/17/23 2154 09/18/23 0301 09/18/23 8119  BP: (!) 102/45 (!) 102/44 103/75 (!) 117/44  Pulse: 77 77 78 82  Resp:  18 20   Temp:  97.7 F (36.5 C) 97.8 F (36.6 C) 97.8 F (36.6 C)  TempSrc:  Oral Oral Oral  SpO2: 94% 97% 98% 98%  Weight:   (!) 180.7 kg   Height:        Intake/Output Summary (Last 24 hours) at 09/18/2023 1210 Last data filed at 09/18/2023 1150 Gross per 24 hour  Intake 1356.65 ml  Output 3100 ml   Net -1743.35 ml   Filed Weights   09/16/23 0434 09/17/23 0500 09/18/23 0301  Weight: (!) 183.4 kg (!) 181.8 kg (!) 180.7 kg   Examination:  General exam: morbidly obese male, awake, alert, oriented x 3, Appears calm and comfortable  Respiratory system: Clear to auscultation. Respiratory effort normal. Cardiovascular system: normal S1 & S2 heard. No JVD, murmurs, rubs, gallops or clicks. No pedal edema. Gastrointestinal system: Abdomen is nondistended, soft and nontender. No organomegaly or masses felt. Normal bowel sounds heard. Central nervous system: Alert and oriented. No focal neurological deficits. Extremities: 3+ edema BLEs (pitting) Symmetric 5 x 5 power. Skin: No rashes, lesions or ulcers. Psychiatry: Judgement and insight appear normal. Mood & affect flat, irritable.   Data Reviewed: I have personally reviewed following labs and imaging studies  CBC: Recent Labs  Lab 09/12/23 0425 09/14/23 0235 09/15/23 0329 09/17/23 0419 09/18/23 0841  WBC 8.5 8.4 7.7 6.6 5.9  HGB 7.2* 7.1* 7.7* 7.3* 8.1*  HCT 29.5* 28.0* 31.0* 30.3* 31.8*  MCV 83.6 86.7 86.4 87.1 87.4  PLT 257 225 213 204 202    Basic Metabolic Panel: Recent Labs  Lab 09/14/23 0235 09/15/23 0329 09/16/23 0438 09/17/23 0419 09/18/23 0841  NA 140 144 145 145 141  K 3.7 3.5 3.5 3.4* 3.2*  CL 100 99 99 100 99  CO2 32 34* 37* 35* 33*  GLUCOSE 111* 115* 128* 112* 137*  BUN 23 25* 26* 28* 29*  CREATININE 1.21 1.18 1.07 1.15 1.16  CALCIUM  8.9 9.4 9.2 9.3 9.3  MG 2.3 2.4 2.4 2.6*  --     CBG: No results for input(s): GLUCAP in the last 168 hours.  No results found for this or any previous visit (from the past 240 hours).   Radiology Studies: DG CHEST PORT 1 VIEW Result Date: 09/16/2023 CLINICAL DATA:  PICC placement EXAM: PORTABLE CHEST 1 VIEW COMPARISON:  09/08/2023 FINDINGS: Single frontal view of the chest demonstrates right-sided PICC tip overlying the atriocaval junction. Cardiac silhouette  remains enlarged. Persistent vascular congestion. Costophrenic angles are excluded by collimation. No pneumothorax. IMPRESSION: 1. Right-sided PICC tip overlying atriocaval junction. 2. Stable enlarged cardiac silhouette and pulmonary vascular congestion. Electronically Signed   By: Bobbye Burrow M.D.   On: 09/16/2023 20:09    Scheduled Meds:  allopurinol   300 mg Oral Daily   Chlorhexidine  Gluconate Cloth  6 each Topical Daily   docusate sodium   100 mg Oral BID   ferrous sulfate  325 mg Oral Q breakfast   folic acid   1 mg Oral Daily   leptospermum manuka honey  1 Application Topical Daily   melatonin  3 mg Oral QHS   metoprolol  tartrate  12.5 mg Oral BID   multivitamin with minerals  1 tablet Oral Daily   nystatin    Topical BID   pantoprazole   40 mg Oral Daily   potassium chloride   40 mEq Oral BID   rivaroxaban   20 mg Oral Daily  rosuvastatin   20 mg Oral Daily   sodium chloride  flush  10-40 mL Intracatheter Q12H   thiamine   100 mg Oral Daily   Or   thiamine   100 mg Intravenous Daily   Continuous Infusions:  furosemide  (LASIX ) 200 mg in dextrose  5 % 100 mL (2 mg/mL) infusion 15 mg/hr (09/18/23 0038)   potassium chloride       LOS: 10 days   Time spent: 55 mins  Diana Armijo Lincoln Renshaw, MD How to contact the [facility] Attending or Consulting provider 7A - 7P or covering provider during after hours 7P -7A, for this patient?  Check the care team in [facility] and look for a) attending/consulting TRH provider listed and b) the TRH team listed Log into [email or web address] to find provider on call.  Locate the TRH provider you are looking for under [facility] and page to a number that you can be directly reached. If you still have difficulty reaching the provider, please page the [facility] (Director on Call) for the Hospitalists listed on amion for assistance.  09/18/2023, 12:10 PM

## 2023-09-18 NOTE — Progress Notes (Signed)
 Patient unable to ambulate for standing weight, patient grew increasingly tired when staff tried assisting him to sit on the side of the bed. Bed weight charted.

## 2023-09-18 NOTE — Plan of Care (Signed)
  Problem: Education: Goal: Knowledge of General Education information will improve Description: Including pain rating scale, medication(s)/side effects and non-pharmacologic comfort measures Outcome: Progressing   Problem: Health Behavior/Discharge Planning: Goal: Ability to manage health-related needs will improve Outcome: Progressing   Problem: Clinical Measurements: Goal: Ability to maintain clinical measurements within normal limits will improve Outcome: Progressing Goal: Will remain free from infection Outcome: Progressing Goal: Diagnostic test results will improve Outcome: Progressing Goal: Respiratory complications will improve Outcome: Progressing Goal: Cardiovascular complication will be avoided Outcome: Progressing   Problem: Activity: Goal: Risk for activity intolerance will decrease Outcome: Progressing   Problem: Nutrition: Goal: Adequate nutrition will be maintained Outcome: Progressing   Problem: Pain Managment: Goal: General experience of comfort will improve and/or be controlled Outcome: Progressing

## 2023-09-18 NOTE — Plan of Care (Signed)
  Problem: Education: Goal: Knowledge of General Education information will improve Description: Including pain rating scale, medication(s)/side effects and non-pharmacologic comfort measures Outcome: Progressing   Problem: Health Behavior/Discharge Planning: Goal: Ability to manage health-related needs will improve Outcome: Progressing   Problem: Clinical Measurements: Goal: Ability to maintain clinical measurements within normal limits will improve Outcome: Progressing Goal: Will remain free from infection Outcome: Progressing Goal: Diagnostic test results will improve Outcome: Progressing Goal: Respiratory complications will improve Outcome: Progressing Goal: Cardiovascular complication will be avoided Outcome: Progressing   Problem: Pain Managment: Goal: General experience of comfort will improve and/or be controlled Outcome: Progressing   Problem: Education: Goal: Ability to demonstrate management of disease process will improve Outcome: Progressing Goal: Ability to verbalize understanding of medication therapies will improve Outcome: Progressing Goal: Individualized Educational Video(s) Outcome: Progressing   Problem: Cardiac: Goal: Ability to achieve and maintain adequate cardiopulmonary perfusion will improve Outcome: Progressing

## 2023-09-18 NOTE — Progress Notes (Signed)
 Progress Note  Patient Name: Frederick Brown Date of Encounter: 09/18/2023  Primary Cardiologist: Janelle Mediate, MD  Subjective   Improved from yesterday.  No symptoms.  Continues to have leg swelling.  Inpatient Medications    Scheduled Meds:  acetaZOLAMIDE  500 mg Intravenous Daily   allopurinol   300 mg Oral Daily   Chlorhexidine  Gluconate Cloth  6 each Topical Daily   docusate sodium   100 mg Oral BID   ferrous sulfate  325 mg Oral Q breakfast   folic acid   1 mg Oral Daily   leptospermum manuka honey  1 Application Topical Daily   melatonin  3 mg Oral QHS   metoprolol  tartrate  12.5 mg Oral BID   multivitamin with minerals  1 tablet Oral Daily   nystatin    Topical BID   pantoprazole   40 mg Oral Daily   potassium chloride   40 mEq Oral Daily   rivaroxaban   20 mg Oral Daily   rosuvastatin   20 mg Oral Daily   sodium chloride  flush  10-40 mL Intracatheter Q12H   thiamine   100 mg Oral Daily   Or   thiamine   100 mg Intravenous Daily   Continuous Infusions:  furosemide  (LASIX ) 200 mg in dextrose  5 % 100 mL (2 mg/mL) infusion 15 mg/hr (09/18/23 0038)   potassium chloride      PRN Meds: acetaminophen  **OR** acetaminophen , guaiFENesin -dextromethorphan , ondansetron  **OR** ondansetron  (ZOFRAN ) IV, polyethylene glycol, sodium chloride  flush   Vital Signs    Vitals:   09/17/23 1521 09/17/23 2154 09/18/23 0301 09/18/23 0908  BP: (!) 102/45 (!) 102/44 103/75 (!) 117/44  Pulse: 77 77 78 82  Resp:  18 20   Temp:  97.7 F (36.5 C) 97.8 F (36.6 C) 97.8 F (36.6 C)  TempSrc:  Oral Oral Oral  SpO2: 94% 97% 98% 98%  Weight:   (!) 180.7 kg   Height:        Intake/Output Summary (Last 24 hours) at 09/18/2023 1101 Last data filed at 09/18/2023 0832 Gross per 24 hour  Intake 1116.65 ml  Output 3100 ml  Net -1983.35 ml   Filed Weights   09/16/23 0434 09/17/23 0500 09/18/23 0301  Weight: (!) 183.4 kg (!) 181.8 kg (!) 180.7 kg    Telemetry     Personally reviewed.  A-fib,  rate controlled.  ECG    Not performed today.  Physical Exam   GEN: No acute distress.   Neck: Unable to examine due to body habitus Cardiac: RRR, no murmur, rub, or gallop.  Respiratory: Nonlabored. Clear to auscultation bilaterally. GI: Soft, nontender, bowel sounds present. MS: 3-4+ pitting edema; No deformity. Neuro:  Nonfocal. Psych: Alert and oriented x 3. Normal affect.  Labs    Chemistry Recent Labs  Lab 09/16/23 0438 09/17/23 0419 09/18/23 0841  NA 145 145 141  K 3.5 3.4* 3.2*  CL 99 100 99  CO2 37* 35* 33*  GLUCOSE 128* 112* 137*  BUN 26* 28* 29*  CREATININE 1.07 1.15 1.16  CALCIUM  9.2 9.3 9.3  GFRNONAA >60 >60 >60  ANIONGAP 9 10 9      Hematology Recent Labs  Lab 09/15/23 0329 09/17/23 0419 09/18/23 0841  WBC 7.7 6.6 5.9  RBC 3.59* 3.48* 3.64*  HGB 7.7* 7.3* 8.1*  HCT 31.0* 30.3* 31.8*  MCV 86.4 87.1 87.4  MCH 21.4* 21.0* 22.3*  MCHC 24.8* 24.1* 25.5*  RDW 23.0* 23.5* 23.6*  PLT 213 204 202    Cardiac Enzymes Recent Labs  Lab 09/08/23 1719  09/08/23 2128  TROPONINIHS 9 9    BNP No results for input(s): BNP, PROBNP in the last 168 hours.    DDimerNo results for input(s): DDIMER in the last 168 hours.   Radiology    DG CHEST PORT 1 VIEW Result Date: 09/16/2023 CLINICAL DATA:  PICC placement EXAM: PORTABLE CHEST 1 VIEW COMPARISON:  09/08/2023 FINDINGS: Single frontal view of the chest demonstrates right-sided PICC tip overlying the atriocaval junction. Cardiac silhouette remains enlarged. Persistent vascular congestion. Costophrenic angles are excluded by collimation. No pneumothorax. IMPRESSION: 1. Right-sided PICC tip overlying atriocaval junction. 2. Stable enlarged cardiac silhouette and pulmonary vascular congestion. Electronically Signed   By: Bobbye Burrow M.D.   On: 09/16/2023 20:09     Assessment & Plan    Acute on chronic diastolic heart failure/RV failure: Presented with DOE, abdominal distention and bilateral lower  EXTR swelling x 3 months.  Noncompliant to home p.o. Lasix  40 mg TID.  Echo this admission showed normal LVEF, IV septum flattening in systole and diastole consistent with RV pressure and volume overload, moderate RV systolic dysfunction, mild enlargement of RV, moderate pulmonary hypertension, mild MR, moderate to severe TR and CVP 15 mmHg.  PICC line was placed on 09/16/2023.  CVP was 16 mmHg (Lasix  drip was increased from 12 mg/h to 15 mg/h) and CVP came down to 13 mmHg this morning.  He made 2.4L urine output in the last 24 hours with net -1.2 L. Continue Lasix  drip at 15 mg/h and IV Diamox 500 mg today. heart failure exacerbation likely exacerbated by acute anemia, s/p IV iron  infusion.  Permanent atrial fibrillation: Rate controlled, continue Lopressor  12.5 mg twice daily and Xarelto  20 mg daily for systemic AC. Hemoglobin 7.6 on admission (hemoglobin 11 around 1 year ago).  No active bleeding.  GI on board.  Severe symptomatic anemia: Hb 7.6 on admission (11 around 1 year ago).  No active bleeding.  GI on board.   Signed, Lenna Hagarty Babs Less, MD  09/18/2023, 11:01 AM

## 2023-09-18 NOTE — Progress Notes (Signed)
 Nurse at bedside,patient alert to self,place and situation,confused to time.Blood pressure was 117/44,heart rate 82,Dr Lincoln Renshaw notified.Plan of care on going.

## 2023-09-19 DIAGNOSIS — E8770 Fluid overload, unspecified: Secondary | ICD-10-CM

## 2023-09-19 DIAGNOSIS — I5033 Acute on chronic diastolic (congestive) heart failure: Secondary | ICD-10-CM | POA: Diagnosis not present

## 2023-09-19 DIAGNOSIS — D649 Anemia, unspecified: Secondary | ICD-10-CM | POA: Diagnosis not present

## 2023-09-19 DIAGNOSIS — F1010 Alcohol abuse, uncomplicated: Secondary | ICD-10-CM | POA: Diagnosis not present

## 2023-09-19 DIAGNOSIS — I4821 Permanent atrial fibrillation: Secondary | ICD-10-CM | POA: Diagnosis not present

## 2023-09-19 LAB — BASIC METABOLIC PANEL WITH GFR
Anion gap: 9 (ref 5–15)
BUN: 26 mg/dL — ABNORMAL HIGH (ref 8–23)
CO2: 35 mmol/L — ABNORMAL HIGH (ref 22–32)
Calcium: 9.5 mg/dL (ref 8.9–10.3)
Chloride: 97 mmol/L — ABNORMAL LOW (ref 98–111)
Creatinine, Ser: 1.1 mg/dL (ref 0.61–1.24)
GFR, Estimated: >60 mL/min (ref >60)
Glucose, Bld: 154 mg/dL — ABNORMAL HIGH (ref 70–99)
Potassium: 3.7 mmol/L (ref 3.5–5.1)
Sodium: 141 mmol/L (ref 135–145)

## 2023-09-19 LAB — CBC
HCT: 29.9 % — ABNORMAL LOW (ref 39.0–52.0)
Hemoglobin: 7.2 g/dL — ABNORMAL LOW (ref 13.0–17.0)
MCH: 21.1 pg — ABNORMAL LOW (ref 26.0–34.0)
MCHC: 24.1 g/dL — ABNORMAL LOW (ref 30.0–36.0)
MCV: 87.7 fL (ref 80.0–100.0)
Platelets: 206 10*3/uL (ref 150–400)
RBC: 3.41 MIL/uL — ABNORMAL LOW (ref 4.22–5.81)
RDW: 23.5 % — ABNORMAL HIGH (ref 11.5–15.5)
WBC: 6.4 10*3/uL (ref 4.0–10.5)
nRBC: 0.3 % — ABNORMAL HIGH (ref 0.0–0.2)

## 2023-09-19 LAB — MAGNESIUM: Magnesium: 2.7 mg/dL — ABNORMAL HIGH (ref 1.7–2.4)

## 2023-09-19 MED ORDER — ACETAZOLAMIDE SODIUM 500 MG IJ SOLR
500.0000 mg | Freq: Every day | INTRAMUSCULAR | Status: DC
  Administered 2023-09-19 – 2023-09-22 (×4): 500 mg via INTRAVENOUS
  Filled 2023-09-19 (×5): qty 500

## 2023-09-19 NOTE — Care Management Important Message (Signed)
 Important Message  Patient Details  Name: Frederick Brown MRN: [id number] Date of Birth: 11-04-1950   Important Message Given:  Yes - Medicare IM     Landis Dowdy L Shameek Nyquist 09/19/2023, 9:41 AM

## 2023-09-19 NOTE — Progress Notes (Signed)
 Occupational Therapy Treatment Patient Details Name: Frederick Brown MRN: [id number] DOB: January 14, 1951 Today's Date: 09/19/2023   History of present illness Frederick Brown is a 73 y.o. male with medical history significant for diastolic CHF, alcohol abuse, atrial fibrillation, hypertension, gout, morbid obesity.  Patient presented to the ED with complaints of generalized weakness, and increased difficulty breathing with exertion with bilateral lower extremity swelling for the past 3 weeks, with abdominal distention.  He supposed to be on Lasix  40 mg TID but has not been compliant.  His alcohol daily-about 2 glasses of vodka daily.  He lives alone and has had difficulty ambulating (due to weakness, dyspnea and leg swelling) to get his alcohol beverages hence he has not had any in the past 3 to 4 days.  Denies hallucinations or shakes. He denies history of alcohol withdrawal.   OT comments  Pt agreeable to OT and PT co-treatment but limited to bed mobility only. Sit to stand briefly attempted using rollator without success. Mod to max A for bed mobility supine to sit and back. Assist to scoot to EOB as well. Pt reporting being very weak. Pt will benefit from continued OT in the hospital and recommended venue below to increase strength, balance, and endurance for safe ADL's.         If plan is discharge home, recommend the following:  Two people to help with walking and/or transfers;A lot of help with bathing/dressing/bathroom;Assistance with cooking/housework;Assist for transportation;Help with stairs or ramp for entrance   Equipment Recommendations  None recommended by OT          Precautions / Restrictions Precautions Precautions: Fall Recall of Precautions/Restrictions: Intact Restrictions Weight Bearing Restrictions Per Provider Order: No       Mobility Bed Mobility Overal bed mobility: Needs Assistance Bed Mobility: Supine to Sit, Sit to Supine     Supine to sit: Mod assist, Max  assist Sit to supine: Max assist, Mod assist   General bed mobility comments: increased time, labored movement    Transfers                   General transfer comment: Attempted once without success.     Balance Overall balance assessment: Needs assistance Sitting-balance support: Feet supported, No upper extremity supported Sitting balance-Leahy Scale: Fair Sitting balance - Comments: fair/good seated at EOB                                    Communication Communication Communication: No apparent difficulties   Cognition Arousal: Alert Behavior During Therapy: WFL for tasks assessed/performed, Anxious Cognition: No apparent impairments                               Following commands: Intact        Cueing   Cueing Techniques: Verbal cues, Tactile cues  Exercises                   Pertinent Vitals/ Pain       Pain Assessment Pain Assessment: Faces Faces Pain Scale: Hurts a little bit Pain Location: general Pain Descriptors / Indicators: Discomfort Pain Intervention(s): Limited activity within patient's tolerance, Monitored during session, Repositioned  Frequency  Min 2X/week        Progress Toward Goals  OT Goals(current goals can now be found in the care plan section)  Progress towards OT goals: Progressing toward goals  Acute Rehab OT Goals Patient Stated Goal: return home OT Goal Formulation: With patient Time For Goal Achievement: 09/24/23 Potential to Achieve Goals: Good ADL Goals Pt Will Perform Upper Body Dressing: with modified independence Pt Will Perform Lower Body Dressing: with modified independence;sitting/lateral leans Pt Will Transfer to Toilet: with min assist;stand pivot transfer;with mod assist Pt Will Perform Toileting - Clothing Manipulation and hygiene: sitting/lateral leans;with contact guard  assist Pt/caregiver will Perform Home Exercise Program: Increased strength;Both right and left upper extremity;Independently  Plan      Co-evaluation    PT/OT/SLP Co-Evaluation/Treatment: Yes Reason for Co-Treatment: To address functional/ADL transfers   OT goals addressed during session: ADL's and self-care                         End of Session Equipment Utilized During Treatment: Rolling walker (2 wheels);Rollator (4 wheels)  OT Visit Diagnosis: Unsteadiness on feet (R26.81);Other abnormalities of gait and mobility (R26.89);Muscle weakness (generalized) (M62.81)   Activity Tolerance Patient limited by fatigue   Patient Left in bed;with call bell/phone within reach   Nurse Communication          Time: 3086-5784 OT Time Calculation (min): 28 min  Charges: OT General Charges $OT Visit: 1 Visit OT Treatments $Therapeutic Exercise: 8-22 mins  Karthik Whittinghill OT, MOT   Thurnell Floss 09/19/2023, 2:15 PM

## 2023-09-19 NOTE — Plan of Care (Signed)

## 2023-09-19 NOTE — Discharge Instructions (Signed)
Food Agency Name: [facility] Address: [address] [place], [place] [address] Phone: [phone] Website: www.adtsrc.org Service(s) Offered: Meals on [employer] and Meals with Friends.  Home care, at home assisted living, volunteer services, Center  for Active Retirement, transportation Agency Name: [facility] Address: Sites vary. Much call first. Food Pantry locations: [address] [place], [place] [address] [employer] Phone: [phone] Website: none Service(s) Offered: Museum/gallery curator, utility assistance if funds available Careers adviser for all of [place], [employer], [employer], Office manager and [employer] for [employer] area only) Walk-in  current Id and current address verification required. WedThurs: 9:30-12:00 Agency Name: [facility] Address: [address] [place], [place] [address] Phone: [phone] or [phone] Website: none Service(s) Offered: Food assistance. Agency Name: [facility] Address: [address] [place], [place] [address] Phone: [phone] Website: BirthdayFever.at Service(s) Offered: Food assistance Monday Nights 5:00PM-5:30PM Agency Name: [employer] Address: [address] [place], [place] [address] Phone: [phone] or [phone] Website: none Service(s) Offered: Serves 1 hot meal a day at 11:00 am Monday-Sunday and 5 pm  on the second and fourth Sunday of each month Agency Name: [facility] Health and Human/Social Services Address: [address], [place], [place] [address] Phone: [phone] Website: [email or web address] Service(s) Offered: Sales executive, WIC program. Agency Name: [place] representative of [facility] Address: [address] [place], [place] / [address] [place], [place]  July 22, 2022 7 Phone: [phone] [place] / [phone] [place] Website:  OpinionTr[employer].tn NetworkAffair.co.za  Service(s) Offered: Civil Service fast streamer, food pantry, soup kitchen ([place]) emergency financial  assistance, thrift stores, showers & hygiene products ([place]),  Christmas Assistance, spiritual help. Agency Name: [facility] Address: [address] [place], [place] [address] Phone: [phone] Website: [email or web address] Service(s) Offered: Food pantry Tuesday, Wednesday and Thursday 9am-11:30am  (need appointment) and health clinic (9:00am-11:00am)

## 2023-09-19 NOTE — Progress Notes (Signed)
 PROGRESS NOTE   Frederick Brown  ZHY:[id number] DOB: [birth year] DOA: 09/08/2023 PCP: Minus Amel, MD   Chief Complaint  Patient presents with   Weakness   Level of care: Telemetry  Brief Admission History:  73 y.o. male with medical history significant for diastolic CHF, alcohol abuse, atrial fibrillation, hypertension, gout, morbid obesity.  Presented with generalized weakness, shortness of breath and worsening leg swelling.  He had not been compliant with his furosemide  at home.  Drinks about 2 glasses of vodka on a daily basis.  He was hospitalized for further management of volume overload.    Assessment and Plan:  Acute on chronic diastolic CHF/acute respiratory failure with hypoxia Pt admitted to noncomliance with diuretic Last echo from 2018 showed LVEF of 55%. Repeated echo with normal LVEF but with evidence of volume overload Strict ins and outs and daily weights. -Pt has not been compliant with fluid restriction  Filed Weights   09/17/23 0500 09/18/23 0301 09/19/23 0500  Weight: (!) 181.8 kg (!) 180.7 kg (!) 175.7 kg    Intake/Output Summary (Last 24 hours) at 09/19/2023 1605 Last data filed at 09/19/2023 1256 Gross per 24 hour  Intake 955.93 ml  Output 2750 ml  Net -1794.07 ml   CVP is 12 today  Remains vol overloaded but CVP coming down with diuresis  Cardiology consulted 6/13 with recommendations to transition to Lasix  drip which he remains on at this time - Currently on 4 L nasal cannula oxygen and typically on room air at home - Continues to have aggressive diuresis and Lasix  drip increased to 15 mg/h by cardiology on 6/18 - PICC line in place and Diamox per cardiology given 6/17-6/19 - CVP being monitored by cardiology team now that we have PICC line placed    Alcohol abuse Counseled.  Pt claims that he has only 2 drinks every evening while cooking his meal.  Continue CIWA protocol thiamine  folate multivitamins.  No evidence for withdrawal currently.    Persistent atrial fibrillation Noted to be on metoprolol .  Borderline low blood pressures noted.   Anticoagulated with Xarelto .  Low hemoglobin noted.   Normocytic anemia Hemoglobin of 8.1 today.  No bleeding episodes.  Patient denies any black stools.  Stool was Hemoccult negative.  Patient reported that he had a colonoscopy within the last 2 to 3 years and which was unremarkable. -On chart review, noted colonoscopy in 2009 with 3 tubular adenomas  Iron  level is low at 16 with sat of 4%. S/p dose of IV iron . On iron  supplementation -Follow-up a.m. CBC, may require transfusion by am if Hgb<7   Chronic kidney disease stage IIIa Renal function is stable with diuresis.  Avoid nephrotoxic agents. Monitor repeat labs each morning   Essential hypertension Monitor blood pressures closely.   History of gout Continue allopurinol .   Morbid obesity Estimated body mass index is 58.01 kg/m as calculated from the following:   Height as of this encounter: 5' 10 (1.778 m).   Weight as of this encounter: 183.4 kg.   DVT prophylaxis: rivaroxaban  Code Status: Full  Communication: discussed plan of care with patient who verbalized understanding Disposition:  SNF when medically cleared   Consultants:  Cardiology   Procedures:  PICC line placement   Antimicrobials:    Subjective: Pt says he is now agreeable to rehab.  He says he feels very weak and not sleeping well.    Objective: Vitals:   09/18/23 2117 09/19/23 0444 09/19/23 0500 09/19/23 1425  BP: (!) 111/48 Aaron Aas)  129/52  (!) 113/54  Pulse:  96  69  Resp:  18  17  Temp:  98.6 F (37 C)  98.7 F (37.1 C)  TempSrc:    Oral  SpO2:  93%  99%  Weight:   (!) 175.7 kg   Height:        Intake/Output Summary (Last 24 hours) at 09/19/2023 1605 Last data filed at 09/19/2023 1256 Gross per 24 hour  Intake 955.93 ml  Output 2750 ml  Net -1794.07 ml   Filed Weights   09/17/23 0500 09/18/23 0301 09/19/23 0500  Weight: (!) 181.8 kg (!)  180.7 kg (!) 175.7 kg   Examination:  General exam: morbidly obese male, awake, alert, oriented x 3, Appears calm and comfortable  Respiratory system: Clear to auscultation. Respiratory effort normal. Cardiovascular system: normal S1 & S2 heard. No JVD, murmurs, rubs, gallops or clicks. No pedal edema. Gastrointestinal system: Abdomen is nondistended, soft and nontender. No organomegaly or masses felt. Normal bowel sounds heard. Central nervous system: Alert and oriented. No focal neurological deficits. Extremities: 3+ edema BLEs (pitting) Symmetric 5 x 5 power. Skin: No rashes, lesions or ulcers. Psychiatry: Judgement and insight appear normal. Mood & affect flat, irritable.   Data Reviewed: I have personally reviewed following labs and imaging studies  CBC: Recent Labs  Lab 09/14/23 0235 09/15/23 0329 09/17/23 0419 09/18/23 0841 09/19/23 0419  WBC 8.4 7.7 6.6 5.9 6.4  HGB 7.1* 7.7* 7.3* 8.1* 7.2*  HCT 28.0* 31.0* 30.3* 31.8* 29.9*  MCV 86.7 86.4 87.1 87.4 87.7  PLT 225 213 204 202 206    Basic Metabolic Panel: Recent Labs  Lab 09/14/23 0235 09/15/23 0329 09/16/23 0438 09/17/23 0419 09/18/23 0841 09/19/23 0419  NA 140 144 145 145 141 141  K 3.7 3.5 3.5 3.4* 3.2* 3.7  CL 100 99 99 100 99 97*  CO2 32 34* 37* 35* 33* 35*  GLUCOSE 111* 115* 128* 112* 137* 154*  BUN 23 25* 26* 28* 29* 26*  CREATININE 1.21 1.18 1.07 1.15 1.16 1.10  CALCIUM  8.9 9.4 9.2 9.3 9.3 9.5  MG 2.3 2.4 2.4 2.6*  --  2.7*    CBG: No results for input(s): GLUCAP in the last 168 hours.  No results found for this or any previous visit (from the past 240 hours).   Radiology Studies: No results found.   Scheduled Meds:  acetaZOLAMIDE  500 mg Intravenous Daily   allopurinol   300 mg Oral Daily   Chlorhexidine  Gluconate Cloth  6 each Topical Daily   docusate sodium   100 mg Oral BID   ferrous sulfate  325 mg Oral Q breakfast   folic acid   1 mg Oral Daily   leptospermum manuka honey  1  Application Topical Daily   melatonin  3 mg Oral QHS   metoprolol  tartrate  12.5 mg Oral BID   multivitamin with minerals  1 tablet Oral Daily   nystatin    Topical BID   pantoprazole   40 mg Oral Daily   potassium chloride   40 mEq Oral BID   rivaroxaban   20 mg Oral Daily   rosuvastatin   20 mg Oral Daily   sodium chloride  flush  10-40 mL Intracatheter Q12H   thiamine   100 mg Oral Daily   Or   thiamine   100 mg Intravenous Daily   Continuous Infusions:  furosemide  (LASIX ) 200 mg in dextrose  5 % 100 mL (2 mg/mL) infusion 15 mg/hr (09/19/23 0637)    LOS: 11 days   Time  spent: 55 mins  Faustino Hook, MD How to contact the [facility] Attending or Consulting provider 7A - 7P or covering provider during after hours 7P -7A, for this patient?  Check the care team in [facility] and look for a) attending/consulting [facility] provider listed and b) the [facility] team listed Log into [email or web address] to find provider on call.  Locate the [facility] provider you are looking for under [facility] and page to a number that you can be directly reached. If you still have difficulty reaching the provider, please page the [facility] (Director on Call) for the Hospitalists listed on amion for assistance.  09/19/2023, 4:05 PM

## 2023-09-19 NOTE — Progress Notes (Signed)
 Physical Therapy Treatment Patient Details Name: Frederick Brown MRN: [id number] DOB: 09/22/1950 Today's Date: 09/19/2023   History of Present Illness Frederick Brown is a 73 y.o. male with medical history significant for diastolic CHF, alcohol abuse, atrial fibrillation, hypertension, gout, morbid obesity.  Patient presented to the ED with complaints of generalized weakness, and increased difficulty breathing with exertion with bilateral lower extremity swelling for the past 3 weeks, with abdominal distention.  He supposed to be on Lasix  40 mg TID but has not been compliant.  His alcohol daily-about 2 glasses of vodka daily.  He lives alone and has had difficulty ambulating (due to weakness, dyspnea and leg swelling) to get his alcohol beverages hence he has not had any in the past 3 to 4 days.  Denies hallucinations or shakes. He denies history of alcohol withdrawal.    PT Comments  Patient demonstrates slow labored movement for sitting up at bedside, once seated able to maintain sitting balance, unable to complete sit to stand due to c/o fatigue/weakness and required Mod/max assist to reposition when put back to bed. Patient will benefit from continued skilled physical therapy in hospital and recommended venue below to increase strength, balance, endurance for safe ADLs and gait.      If plan is discharge home, recommend the following: A lot of help with bathing/dressing/bathroom;A lot of help with walking and/or transfers;Help with stairs or ramp for entrance;Assistance with cooking/housework   Can travel by private vehicle     No  Equipment Recommendations  None recommended by PT    Recommendations for Other Services       Precautions / Restrictions Precautions Precautions: Fall Recall of Precautions/Restrictions: Intact Restrictions Weight Bearing Restrictions Per Provider Order: No     Mobility  Bed Mobility Overal bed mobility: Needs Assistance Bed Mobility: Supine to Sit,  Sit to Supine     Supine to sit: Mod assist, Max assist Sit to supine: Max assist, Mod assist   General bed mobility comments: increased time, labored movement, difficulty moving BLE due to weakness    Transfers                        Ambulation/Gait                   Stairs             Wheelchair Mobility     Tilt Bed    Modified Rankin (Stroke Patients Only)       Balance Overall balance assessment: Needs assistance Sitting-balance support: Feet unsupported, No upper extremity supported Sitting balance-Leahy Scale: Fair Sitting balance - Comments: fair/good seated at EOB                                    Communication Communication Communication: No apparent difficulties  Cognition Arousal: Alert Behavior During Therapy: WFL for tasks assessed/performed, Anxious   PT - Cognitive impairments: No apparent impairments                         Following commands: Intact      Cueing Cueing Techniques: Verbal cues, Tactile cues  Exercises General Exercises - Lower Extremity Ankle Circles/Pumps: 10 reps, Strengthening, AROM, PROM Heel Slides: AROM, Strengthening, Both, 5 reps    General Comments        Pertinent Vitals/Pain Pain Assessment Pain Assessment:  Faces Faces Pain Scale: Hurts a little bit Pain Location: general Pain Descriptors / Indicators: Discomfort Pain Intervention(s): Limited activity within patient's tolerance, Monitored during session, Repositioned    Home Living                          Prior Function            PT Goals (current goals can now be found in the care plan section) Acute Rehab PT Goals Patient Stated Goal: return home with neighbor, friends to assist PT Goal Formulation: With patient Time For Goal Achievement: 09/23/23 Potential to Achieve Goals: Fair Progress towards PT goals: Progressing toward goals    Frequency    Min 3X/week      PT Plan       Co-evaluation PT/OT/SLP Co-Evaluation/Treatment: Yes Reason for Co-Treatment: To address functional/ADL transfers PT goals addressed during session: Mobility/safety with mobility;Balance;Proper use of DME OT goals addressed during session: ADL's and self-care      AM-PAC PT 6 Clicks Mobility   Outcome Measure  Help needed turning from your back to your side while in a flat bed without using bedrails?: A Lot Help needed moving from lying on your back to sitting on the side of a flat bed without using bedrails?: A Lot Help needed moving to and from a bed to a chair (including a wheelchair)?: Total Help needed standing up from a chair using your arms (e.g., wheelchair or bedside chair)?: Total Help needed to walk in hospital room?: Total Help needed climbing 3-5 steps with a railing? : Total 6 Click Score: 8    End of Session Equipment Utilized During Treatment: Oxygen Activity Tolerance: Patient tolerated treatment well;Patient limited by fatigue Patient left: in bed;with call bell/phone within reach Nurse Communication: Mobility status PT Visit Diagnosis: Unsteadiness on feet (R26.81);Other abnormalities of gait and mobility (R26.89);Muscle weakness (generalized) (M62.81)     Time: 1335-1401 PT Time Calculation (min) (ACUTE ONLY): 26 min  Charges:    $Therapeutic Activity: 23-37 mins PT General Charges $$ ACUTE PT VISIT: 1 Visit                     3:01 PM, 09/19/23 Walton Guppy, MPT Physical Therapist with [facility] [phone] office [phone] mobile phone

## 2023-09-19 NOTE — TOC Progression Note (Signed)
 Transition of Care [facility]) - Progression Note    Patient Details  Name: Frederick Brown MRN: [id number] Date of Birth: 11/22/1950  [facility] of [facility]) CM/SW Contact  Ander Katos, [place] Phone Number: 09/19/2023, 11:55 AM  Clinical Narrative:  Pt now agreeable to SNF. LCSW reviewed placement process. He requests [place] if possible. Will initiate bed search. Per MD, pt will not be ready until next week. Will start authorization when appropriate.      Expected Discharge Plan: Home w Home Health Services Barriers to Discharge: Continued Medical Work up  Expected Discharge Plan and Services       Living arrangements for the past 2 months: Single Family Home                           HH Arranged: PT HH Agency: [facility]         Social Determinants of Health (SDOH) Interventions SDOH Screenings   Food Insecurity: Food Insecurity Present (09/08/2023)  Housing: Low Risk  (09/08/2023)  Transportation Needs: No Transportation Needs (09/08/2023)  Utilities: Not At Risk (09/08/2023)  Social Connections: Socially Isolated (09/08/2023)  Tobacco Use: Medium Risk (09/08/2023)    Readmission Risk Interventions     No data to display

## 2023-09-19 NOTE — Plan of Care (Signed)

## 2023-09-19 NOTE — Progress Notes (Signed)
 Progress Note  Patient Name: Frederick Brown Date of Encounter: 09/19/2023  Primary Cardiologist: Janelle Mediate, MD  Subjective   Patient resting.  He says he is feeling down.  Inpatient Medications    Scheduled Meds:  allopurinol   300 mg Oral Daily   Chlorhexidine  Gluconate Cloth  6 each Topical Daily   docusate sodium   100 mg Oral BID   ferrous sulfate  325 mg Oral Q breakfast   folic acid   1 mg Oral Daily   leptospermum manuka honey  1 Application Topical Daily   melatonin  3 mg Oral QHS   metoprolol  tartrate  12.5 mg Oral BID   multivitamin with minerals  1 tablet Oral Daily   nystatin    Topical BID   pantoprazole   40 mg Oral Daily   potassium chloride   40 mEq Oral BID   rivaroxaban   20 mg Oral Daily   rosuvastatin   20 mg Oral Daily   sodium chloride  flush  10-40 mL Intracatheter Q12H   thiamine   100 mg Oral Daily   Or   thiamine   100 mg Intravenous Daily   Continuous Infusions:  furosemide  (LASIX ) 200 mg in dextrose  5 % 100 mL (2 mg/mL) infusion 15 mg/hr (09/19/23 0637)   PRN Meds: acetaminophen  **OR** acetaminophen , guaiFENesin -dextromethorphan , ondansetron  **OR** ondansetron  (ZOFRAN ) IV, polyethylene glycol, sodium chloride  flush   Vital Signs    Vitals:   09/18/23 1900 09/18/23 2117 09/19/23 0444 09/19/23 0500  BP: (!) 111/48 (!) 111/48 (!) 129/52   Pulse: 76  96   Resp: 19  18   Temp: 98.5 F (36.9 C)  98.6 F (37 C)   TempSrc: Oral     SpO2: 93%  93%   Weight:    (!) 175.7 kg  Height:        Intake/Output Summary (Last 24 hours) at 09/19/2023 1011 Last data filed at 09/19/2023 0839 Gross per 24 hour  Intake 1155.93 ml  Output 4050 ml  Net -2894.07 ml   Filed Weights   09/17/23 0500 09/18/23 0301 09/19/23 0500  Weight: (!) 181.8 kg (!) 180.7 kg (!) 175.7 kg    Telemetry     Personally reviewed.  A-fib, rate controlled.  ECG    Not performed today.  Physical Exam   GEN: No acute distress.   Neck: Unable to examine due to body  habitus Cardiac: RRR, no murmur, rub, or gallop.  Respiratory: Nonlabored. Clear to auscultation bilaterally. GI: Soft, nontender, bowel sounds present. MS: 3-4+ pitting edema; No deformity. Neuro:  Nonfocal. Psych: Alert and oriented x 3. Normal affect.  Labs    Chemistry Recent Labs  Lab 09/17/23 0419 09/18/23 0841 09/19/23 0419  NA 145 141 141  K 3.4* 3.2* 3.7  CL 100 99 97*  CO2 35* 33* 35*  GLUCOSE 112* 137* 154*  BUN 28* 29* 26*  CREATININE 1.15 1.16 1.10  CALCIUM  9.3 9.3 9.5  GFRNONAA >60 >60 >60  ANIONGAP 10 9 9      Hematology Recent Labs  Lab 09/17/23 0419 09/18/23 0841 09/19/23 0419  WBC 6.6 5.9 6.4  RBC 3.48* 3.64* 3.41*  HGB 7.3* 8.1* 7.2*  HCT 30.3* 31.8* 29.9*  MCV 87.1 87.4 87.7  MCH 21.0* 22.3* 21.1*  MCHC 24.1* 25.5* 24.1*  RDW 23.5* 23.6* 23.5*  PLT 204 202 206    Cardiac Enzymes Recent Labs  Lab 09/08/23 1719 09/08/23 2128  TROPONINIHS 9 9    BNP No results for input(s): BNP, PROBNP in the last 168  hours.    DDimerNo results for input(s): DDIMER in the last 168 hours.   Radiology    No results found.    Assessment & Plan    Acute on chronic diastolic heart failure/RV failure: Presented with DOE, abdominal distention and bilateral lower EXTR swelling x 3 months.  Noncompliant to home p.o. Lasix  40 mg TID.  Echo this admission showed normal LVEF, IV septum flattening in systole and diastole consistent with RV pressure and volume overload, moderate RV systolic dysfunction, mild enlargement of RV, moderate pulmonary hypertension, mild MR, moderate to severe TR and CVP 15 mmHg.  PICC line was placed on 09/16/2023.  CVP this morning was 12 mmHg (decreased from 30 mmHg yesterday).  4.7L urine output in the last 24 hours with net -3.8 L on Lasix  drip 15 mg/h and IV Diamox 500 mg.  Continue Lasix  drip 15 mg/h and IV Diamox 500 mg once daily.heart failure exacerbation likely exacerbated by acute anemia, s/p IV iron   infusion.  Permanent atrial fibrillation: Rate controlled, continue Lopressor  12.5 mg twice daily and Xarelto  20 mg daily for systemic AC. Hemoglobin 7.6 on admission (hemoglobin 11 around 1 year ago).  No active bleeding.  GI on board.  Severe symptomatic anemia: Hb 7.6 on admission (11 around 1 year ago).  No active bleeding.  GI on board.   Signed, Lasalle Pointer, MD  09/19/2023, 10:11 AM

## 2023-09-19 NOTE — NC FL2 (Signed)
 [place]  MEDICAID FL2 LEVEL OF CARE FORM     IDENTIFICATION  Patient Name: Frederick Brown Birthdate: [birth year] Sex: male Admission Date (Current Location): 09/08/2023  [facility] and [place] Number:  [employer] and Address:  [place],  [address] [place] [address]      Provider Number: [id number]  Attending Physician Name and Address:  Rayfield Cairo, MD  Relative Name and Phone Number:       Current Level of Care: Hospital Recommended Level of Care: Skilled Nursing Facility Prior Approval Number:    Date Approved/Denied:   PASRR Number: [id number]  Discharge Plan: SNF    Current Diagnoses: Patient Active Problem List   Diagnosis Date Noted   Severe anemia 09/17/2023   Pressure injury of skin 09/17/2023   Acute on chronic diastolic (congestive) heart failure (HCC) 09/08/2023   Ureteral calculi 02/22/2015   Atrial fibrillation with RVR (HCC) 02/22/2015   Pyelonephritis 01/17/2015   Hydronephrosis 01/17/2015   Lower extremity weakness 01/17/2015   Essential hypertension 01/17/2015   GERD without esophagitis 01/17/2015   Jaundice 10/07/2013   Morbid obesity (HCC) 10/07/2013   Hyperkalemia 06/23/2013   Hypotension 06/23/2013   Dehydration 06/23/2013   Diastolic dysfunction 06/23/2013   Gout 06/23/2013   Urinary tract infection, site not specified 06/23/2013   AKI (acute kidney injury) (HCC) 06/22/2013   Urticarial rash 05/31/2011   Lower urinary tract infectious disease 05/30/2011   Hypercalcemia 05/29/2011   Fracture of metatarsal of left foot, closed 05/28/2011   Fracture of great toe, left, closed 05/28/2011   Chronic respiratory failure with hypoxia (HCC) 05/28/2011   Cor pulmonale, chronic (HCC) 05/24/2011   Acute diastolic heart failure (HCC) 05/23/2011   Vitamin B12 deficiency 05/22/2011   Bronchospasm 05/21/2011   Atrial fibrillation (HCC) 05/21/2011   Superficial fungal infection of skin 05/21/2011    Hypernatremia 05/21/2011   Macrocytosis 05/21/2011   Alcohol abuse 05/21/2011    Orientation RESPIRATION BLADDER Height & Weight     Self, Situation, Time, Place  O2 (4L) Incontinent Weight: (!) 387 lb 5.6 oz (175.7 kg) Height:  5' 10 (177.8 cm)  BEHAVIORAL SYMPTOMS/MOOD NEUROLOGICAL BOWEL NUTRITION STATUS      Incontinent Diet (See d/c summary)  AMBULATORY STATUS COMMUNICATION OF NEEDS Skin   Extensive Assist Verbally Normal, Other (Comment), Bruising (pressure injury right posterior thigh stage 3 and left posterior thigh stage 1)                       Personal Care Assistance Level of Assistance  Bathing, Feeding, Dressing Bathing Assistance: Maximum assistance Feeding assistance: Limited assistance Dressing Assistance: Maximum assistance     Functional Limitations Info  Sight, Hearing, Speech Sight Info: Adequate Hearing Info: Adequate Speech Info: Adequate    SPECIAL CARE FACTORS FREQUENCY  PT (By licensed PT), OT (By licensed OT)     PT Frequency: 5 times weekly OT Frequency: 5 times weekly            Contractures Contractures Info: Not present    Additional Factors Info  Code Status, Allergies Code Status Info: FULL Allergies Info: NKA           Current Medications (09/19/2023):  This is the current hospital active medication list Current Facility-Administered Medications  Medication Dose Route Frequency Provider Last Rate Last Admin   acetaminophen  (TYLENOL ) tablet 650 mg  650 mg Oral Q6H PRN Emokpae, Ejiroghene E, MD   650 mg at 09/16/23 2225  Or   acetaminophen  (TYLENOL ) suppository 650 mg  650 mg Rectal Q6H PRN Emokpae, Ejiroghene E, MD       acetaZOLAMIDE (DIAMOX) injection 500 mg  500 mg Intravenous Daily Mallipeddi, Vishnu P, MD   500 mg at 09/19/23 1146   allopurinol  (ZYLOPRIM ) tablet 300 mg  300 mg Oral Daily Emokpae, Ejiroghene E, MD   300 mg at 09/19/23 0912   Chlorhexidine  Gluconate Cloth 2 % PADS 6 each  6 each Topical Daily Laurann Pollock, MD   6 each at 09/19/23 1610   docusate sodium  (COLACE) capsule 100 mg  100 mg Oral BID Oral Billings, MD   100 mg at 09/19/23 9604   ferrous sulfate tablet 325 mg  325 mg Oral Q breakfast Oral Billings, MD   325 mg at 09/19/23 0912   folic acid  (FOLVITE ) tablet 1 mg  1 mg Oral Daily Emokpae, Ejiroghene E, MD   1 mg at 09/19/23 0912   furosemide  (LASIX ) 200 mg in dextrose  5 % 100 mL (2 mg/mL) infusion  15 mg/hr Intravenous Continuous Mallipeddi, Vishnu P, MD 7.5 mL/hr at 09/19/23 0637 15 mg/hr at 09/19/23 5409   guaiFENesin -dextromethorphan  (ROBITUSSIN DM) 100-10 MG/5ML syrup 5 mL  5 mL Oral Q4H PRN Oral Billings, MD   5 mL at 09/18/23 0232   leptospermum manuka honey (MEDIHONEY) paste 1 Application  1 Application Topical Daily Krishnan, Gokul, MD   1 Application at 09/19/23 0910   melatonin tablet 3 mg  3 mg Oral QHS Emokpae, Ejiroghene E, MD   3 mg at 09/18/23 2115   metoprolol  tartrate (LOPRESSOR ) tablet 12.5 mg  12.5 mg Oral BID Johnson, Clanford L, MD   12.5 mg at 09/19/23 8119   multivitamin with minerals tablet 1 tablet  1 tablet Oral Daily Emokpae, Ejiroghene E, MD   1 tablet at 09/19/23 0912   nystatin  (MYCOSTATIN /NYSTOP ) topical powder   Topical BID Shah, Pratik D, DO   Given at 09/19/23 1478   ondansetron  (ZOFRAN ) tablet 4 mg  4 mg Oral Q6H PRN Emokpae, Ejiroghene E, MD       Or   ondansetron  (ZOFRAN ) injection 4 mg  4 mg Intravenous Q6H PRN Emokpae, Ejiroghene E, MD       pantoprazole  (PROTONIX ) EC tablet 40 mg  40 mg Oral Daily Krishnan, Gokul, MD   40 mg at 09/19/23 0912   polyethylene glycol (MIRALAX  / GLYCOLAX ) packet 17 g  17 g Oral Daily PRN Emokpae, Ejiroghene E, MD   17 g at 09/13/23 1536   potassium chloride  SA (KLOR-CON  M) CR tablet 40 mEq  40 mEq Oral BID Johnson, Clanford L, MD   40 mEq at 09/19/23 0912   rivaroxaban  (XARELTO ) tablet 20 mg  20 mg Oral Daily Emokpae, Ejiroghene E, MD   20 mg at 09/18/23 1719   rosuvastatin  (CRESTOR ) tablet 20 mg  20 mg Oral  Daily Emokpae, Ejiroghene E, MD   20 mg at 09/19/23 0912   sodium chloride  flush (NS) 0.9 % injection 10-40 mL  10-40 mL Intracatheter Q12H Laurann Pollock, MD   10 mL at 09/19/23 0913   sodium chloride  flush (NS) 0.9 % injection 10-40 mL  10-40 mL Intracatheter PRN Laurann Pollock, MD       thiamine  (VITAMIN B1) tablet 100 mg  100 mg Oral Daily Emokpae, Ejiroghene E, MD   100 mg at 09/19/23 0912   Or   thiamine  (VITAMIN B1) injection 100 mg  100 mg  Intravenous Daily Emokpae, Ejiroghene E, MD         Discharge Medications: Please see discharge summary for a list of discharge medications.  Relevant Imaging Results:  Relevant Lab Results:   Additional Information SSN: [id number]  Ander Katos, LCSW

## 2023-09-20 DIAGNOSIS — I5033 Acute on chronic diastolic (congestive) heart failure: Secondary | ICD-10-CM | POA: Diagnosis not present

## 2023-09-20 DIAGNOSIS — F1010 Alcohol abuse, uncomplicated: Secondary | ICD-10-CM | POA: Diagnosis not present

## 2023-09-20 DIAGNOSIS — D649 Anemia, unspecified: Secondary | ICD-10-CM | POA: Diagnosis not present

## 2023-09-20 LAB — CBC
HCT: 31 % — ABNORMAL LOW (ref 39.0–52.0)
Hemoglobin: 7.5 g/dL — ABNORMAL LOW (ref 13.0–17.0)
MCH: 21.4 pg — ABNORMAL LOW (ref 26.0–34.0)
MCHC: 24.2 g/dL — ABNORMAL LOW (ref 30.0–36.0)
MCV: 88.3 fL (ref 80.0–100.0)
Platelets: 219 10*3/uL (ref 150–400)
RBC: 3.51 MIL/uL — ABNORMAL LOW (ref 4.22–5.81)
RDW: 23.5 % — ABNORMAL HIGH (ref 11.5–15.5)
WBC: 6.4 10*3/uL (ref 4.0–10.5)
nRBC: 0 % (ref 0.0–0.2)

## 2023-09-20 LAB — BASIC METABOLIC PANEL WITH GFR
Anion gap: 5 (ref 5–15)
BUN: 27 mg/dL — ABNORMAL HIGH (ref 8–23)
CO2: 38 mmol/L — ABNORMAL HIGH (ref 22–32)
Calcium: 9.8 mg/dL (ref 8.9–10.3)
Chloride: 101 mmol/L (ref 98–111)
Creatinine, Ser: 1.11 mg/dL (ref 0.61–1.24)
GFR, Estimated: >60 mL/min (ref >60)
Glucose, Bld: 111 mg/dL — ABNORMAL HIGH (ref 70–99)
Potassium: 3.9 mmol/L (ref 3.5–5.1)
Sodium: 144 mmol/L (ref 135–145)

## 2023-09-20 MED ORDER — MILK AND MOLASSES ENEMA
1.0000 | Freq: Once | RECTAL | Status: AC
  Administered 2023-09-20: 150 mL via RECTAL

## 2023-09-20 NOTE — Progress Notes (Signed)
 Pt pulled out picc, catheter intact. PIV placed.

## 2023-09-20 NOTE — Progress Notes (Signed)
 PROGRESS NOTE   Frederick Brown  FMW:[id number] DOB: 1950-09-29 DOA: 09/08/2023 PCP: [name], MD   Chief Complaint  Patient presents with   Weakness   Level of care: Telemetry  Brief Admission History:  73 y.o. male with medical history significant for diastolic CHF, alcohol abuse, atrial fibrillation, hypertension, gout, morbid obesity.  Presented with generalized weakness, shortness of breath and worsening leg swelling.  He had not been compliant with his furosemide  at home.  Drinks about 2 glasses of vodka on a daily basis.  He was hospitalized for further management of volume overload.    Assessment and Plan:  Acute on chronic diastolic CHF/acute respiratory failure with hypoxia Pt admitted to noncomliance with diuretic Last echo from 2018 showed LVEF of 55%. Repeated echo with normal LVEF but with evidence of volume overload Strict ins and outs and daily weights. -Pt has not been compliant with fluid restriction  Filed Weights   09/18/23 0301 09/19/23 0500 09/20/23 0443  Weight: (!) 180.7 kg (!) 175.7 kg (!) 174 kg    Intake/Output Summary (Last 24 hours) at 09/20/2023 1710 Last data filed at 09/20/2023 1403 Gross per 24 hour  Intake 1440 ml  Output 2150 ml  Net -710 ml   CVP is 14 today  Remains vol overloaded but CVP coming down with diuresis and weight is coming down  Cardiology consulted 6/13 with recommendations to transition to Lasix  drip which he remains on at this time - Currently on 4 L nasal cannula oxygen  and typically on room air at home - Continues to have aggressive diuresis and Lasix  drip increased to 15 mg/h by cardiology on 6/18 - PICC line in place and Diamox  per cardiology - CVP being monitored by cardiology team now that we have PICC line placed    Alcohol abuse Counseled.  Pt claims that he has only 2 drinks every evening while cooking his meal.  Continue CIWA protocol thiamine  folate multivitamins.  No evidence for withdrawal currently.    Persistent atrial fibrillation Noted to be on metoprolol .  Borderline low blood pressures noted.   Anticoagulated with Xarelto .  Low hemoglobin noted.   Normocytic anemia Hemoglobin of 8.1 today.  No bleeding episodes.  Patient denies any black stools.  Stool was Hemoccult negative.  Patient reported that he had a colonoscopy within the last 2 to 3 years and which was unremarkable. -On chart review, noted colonoscopy in 2009 with 3 tubular adenomas  Iron  level is low at 16 with sat of 4%. S/p dose of IV iron . On iron  supplementation -Follow-up a.m. CBC, may require transfusion by am if Hgb<7   Chronic kidney disease stage IIIa Renal function is stable with diuresis.  Avoid nephrotoxic agents. Monitor repeat labs each morning   Essential hypertension Monitor blood pressures closely.   History of gout Continue allopurinol .   Morbid obesity Estimated body mass index is 58.01 kg/m as calculated from the following:   Height as of this encounter: 5' 10 (1.778 m).   Weight as of this encounter: 183.4 kg.   DVT prophylaxis: rivaroxaban  Code Status: Full  Communication: discussed plan of care with patient who verbalized understanding Disposition:  SNF when medically cleared   Consultants:  Cardiology   Procedures:  PICC line placement   Antimicrobials:    Subjective: Pt says he has been intermittently confused after being in hospital for so many days.      Objective: Vitals:   09/19/23 2027 09/20/23 0443 09/20/23 0603 09/20/23 1402  BP: ROLLEN)  113/51 (!) 111/32  114/63  Pulse: 80 83  74  Resp: 16 17    Temp: 98.5 F (36.9 C) 97.8 F (36.6 C)  98.3 F (36.8 C)  TempSrc: Oral   Axillary  SpO2: 99%  91% 96%  Weight:  (!) 174 kg    Height:        Intake/Output Summary (Last 24 hours) at 09/20/2023 1710 Last data filed at 09/20/2023 1403 Gross per 24 hour  Intake 1440 ml  Output 2150 ml  Net -710 ml   Filed Weights   09/18/23 0301 09/19/23 0500 09/20/23 0443   Weight: (!) 180.7 kg (!) 175.7 kg (!) 174 kg   Examination:  General exam: morbidly obese male, awake, alert, oriented x 3, Appears calm and comfortable  Respiratory system: Clear to auscultation. Respiratory effort normal. Cardiovascular system: normal S1 & S2 heard. No JVD, murmurs, rubs, gallops or clicks. No pedal edema. Gastrointestinal system: Abdomen is nondistended, soft and nontender. No organomegaly or masses felt. Normal bowel sounds heard. Central nervous system: Alert and oriented. No focal neurological deficits. Extremities: 2+ edema BLEs (pitting) Symmetric 5 x 5 power. Skin: No rashes, lesions or ulcers. Psychiatry: Judgement and insight appear normal. Mood & affect flat, irritable.   Data Reviewed: I have personally reviewed following labs and imaging studies  CBC: Recent Labs  Lab 09/15/23 0329 09/17/23 0419 09/18/23 0841 09/19/23 0419 09/20/23 0405  WBC 7.7 6.6 5.9 6.4 6.4  HGB 7.7* 7.3* 8.1* 7.2* 7.5*  HCT 31.0* 30.3* 31.8* 29.9* 31.0*  MCV 86.4 87.1 87.4 87.7 88.3  PLT 213 204 202 206 219    Basic Metabolic Panel: Recent Labs  Lab 09/14/23 0235 09/15/23 0329 09/16/23 0438 09/17/23 0419 09/18/23 0841 09/19/23 0419 09/20/23 0405  NA 140 144 145 145 141 141 144  K 3.7 3.5 3.5 3.4* 3.2* 3.7 3.9  CL 100 99 99 100 99 97* 101  CO2 32 34* 37* 35* 33* 35* 38*  GLUCOSE 111* 115* 128* 112* 137* 154* 111*  BUN 23 25* 26* 28* 29* 26* 27*  CREATININE 1.21 1.18 1.07 1.15 1.16 1.10 1.11  CALCIUM  8.9 9.4 9.2 9.3 9.3 9.5 9.8  MG 2.3 2.4 2.4 2.6*  --  2.7*  --     CBG: No results for input(s): GLUCAP in the last 168 hours.  No results found for this or any previous visit (from the past 240 hours).   Radiology Studies: No results found.   Scheduled Meds:  acetaZOLAMIDE   500 mg Intravenous Daily   allopurinol   300 mg Oral Daily   Chlorhexidine  Gluconate Cloth  6 each Topical Daily   docusate sodium   100 mg Oral BID   ferrous sulfate   325 mg Oral Q  breakfast   folic acid   1 mg Oral Daily   leptospermum manuka honey  1 Application Topical Daily   melatonin  3 mg Oral QHS   metoprolol  tartrate  12.5 mg Oral BID   milk and molasses  1 enema Rectal Once   multivitamin with minerals  1 tablet Oral Daily   nystatin    Topical BID   pantoprazole   40 mg Oral Daily   potassium chloride   40 mEq Oral BID   rivaroxaban   20 mg Oral Daily   rosuvastatin   20 mg Oral Daily   sodium chloride  flush  10-40 mL Intracatheter Q12H   thiamine   100 mg Oral Daily   Or   thiamine   100 mg Intravenous Daily   Continuous Infusions:  furosemide  (LASIX ) 200 mg in dextrose  5 % 100 mL (2 mg/mL) infusion 15 mg/hr (09/20/23 1219)    LOS: 12 days   Time spent: 55 mins  Frederick Lux Vicci, MD How to contact the [facility] Attending or Consulting provider 7A - 7P or covering provider during after hours 7P -7A, for this patient?  Check the care team in [facility] and look for a) attending/consulting TRH provider listed and b) the TRH team listed Log into [email or web address] to find provider on call.  Locate the TRH provider you are looking for under [facility] and page to a number that you can be directly reached. If you still have difficulty reaching the provider, please page the [facility] (Director on Call) for the Hospitalists listed on amion for assistance.  09/20/2023, 5:10 PM

## 2023-09-20 NOTE — Progress Notes (Signed)
 Milk of molasses with milk enema given. Pt finally had a BM after 12 days.

## 2023-09-20 NOTE — Progress Notes (Signed)
 CVP 14mm/hg

## 2023-09-20 NOTE — Plan of Care (Signed)

## 2023-09-21 DIAGNOSIS — I5033 Acute on chronic diastolic (congestive) heart failure: Secondary | ICD-10-CM | POA: Diagnosis not present

## 2023-09-21 DIAGNOSIS — D649 Anemia, unspecified: Secondary | ICD-10-CM | POA: Diagnosis not present

## 2023-09-21 DIAGNOSIS — F1010 Alcohol abuse, uncomplicated: Secondary | ICD-10-CM | POA: Diagnosis not present

## 2023-09-21 LAB — BASIC METABOLIC PANEL WITH GFR
Anion gap: 6 (ref 5–15)
BUN: 28 mg/dL — ABNORMAL HIGH (ref 8–23)
CO2: 36 mmol/L — ABNORMAL HIGH (ref 22–32)
Calcium: 9.7 mg/dL (ref 8.9–10.3)
Chloride: 101 mmol/L (ref 98–111)
Creatinine, Ser: 1.1 mg/dL (ref 0.61–1.24)
GFR, Estimated: >60 mL/min (ref >60)
Glucose, Bld: 111 mg/dL — ABNORMAL HIGH (ref 70–99)
Potassium: 4.1 mmol/L (ref 3.5–5.1)
Sodium: 143 mmol/L (ref 135–145)

## 2023-09-21 LAB — CBC
HCT: 30.2 % — ABNORMAL LOW (ref 39.0–52.0)
Hemoglobin: 7.3 g/dL — ABNORMAL LOW (ref 13.0–17.0)
MCH: 21.3 pg — ABNORMAL LOW (ref 26.0–34.0)
MCHC: 24.2 g/dL — ABNORMAL LOW (ref 30.0–36.0)
MCV: 88 fL (ref 80.0–100.0)
Platelets: 225 10*3/uL (ref 150–400)
RBC: 3.43 MIL/uL — ABNORMAL LOW (ref 4.22–5.81)
RDW: 23.5 % — ABNORMAL HIGH (ref 11.5–15.5)
WBC: 5.9 10*3/uL (ref 4.0–10.5)
nRBC: 0 % (ref 0.0–0.2)

## 2023-09-21 NOTE — Progress Notes (Addendum)
 PROGRESS NOTE   Frederick Brown  FMW:[id number] DOB: Dec 29, 1950 DOA: 09/08/2023 PCP: [name], MD   Chief Complaint  Patient presents with   Weakness   Level of care: Telemetry  Brief Admission History:  73 y.o. male with medical history significant for diastolic CHF, alcohol abuse, atrial fibrillation, hypertension, gout, morbid obesity.  Presented with generalized weakness, shortness of breath and worsening leg swelling.  He had not been compliant with his furosemide  at home.  Drinks about 2 glasses of vodka on a daily basis.  He was hospitalized for further management of volume overload.    Assessment and Plan:  Acute on chronic diastolic CHF/acute respiratory failure with hypoxia Pt admitted to noncomliance with diuretic Last echo from 2018 showed LVEF of 55%. Repeated echo with normal LVEF but with evidence of volume overload Strict ins and outs and daily weights. -Pt has not been compliant with fluid restriction  Filed Weights   09/19/23 0500 09/20/23 0443 09/21/23 0450  Weight: (!) 175.7 kg (!) 174 kg (!) 172.1 kg    Intake/Output Summary (Last 24 hours) at 09/21/2023 1607 Last data filed at 09/21/2023 0800 Gross per 24 hour  Intake 600 ml  Output 1950 ml  Net -1350 ml   CVP not being measured anymore because patient pulled out PICC line  Remains vol overloaded but CVP coming down with diuresis and weight is coming down  Cardiology consulted 6/13 with recommendations to transition to Lasix  drip which he remains on at this time - Currently on 4 L nasal cannula oxygen  and typically on room air at home - Continues to have aggressive diuresis and Lasix  drip increased to 15 mg/h by cardiology on 6/18 - PICC line pulled out by patient, IV lasix  infusion and Diamox  dosing per cardiology - CVP was monitored by cardiology team now via PICC line however PICC was pulled out by patient    Alcohol abuse Counseled.  Pt claims that he has only 2 drinks every evening while  cooking his meal.  Continue CIWA protocol thiamine  folate multivitamins.  No evidence for withdrawal currently.   Persistent atrial fibrillation Noted to be on metoprolol .  Borderline low blood pressures noted.   Anticoagulated with Xarelto .  Low hemoglobin noted.   Normocytic anemia Hemoglobin of 8.1 today.  No bleeding episodes.  Patient denies any black stools.  Stool was Hemoccult negative.  Patient reported that he had a colonoscopy within the last 2 to 3 years and which was unremarkable. -On chart review, noted colonoscopy in 2009 with 3 tubular adenomas  Iron  level is low at 16 with sat of 4%. S/p dose of IV iron . On iron  supplementation -Follow-up a.m. CBC, may require transfusion by am if Hgb<7   Chronic kidney disease stage IIIa Renal function is stable with diuresis.  Avoid nephrotoxic agents. Monitor repeat labs each morning   Essential hypertension Monitor blood pressures closely.   History of gout Continue allopurinol .   Morbid obesity Estimated body mass index is 58.01 kg/m as calculated from the following:   Height as of this encounter: 5' 10 (1.778 m).   Weight as of this encounter: 183.4 kg.   DVT prophylaxis: rivaroxaban  Code Status: Full  Communication: discussed plan of care with patient who verbalized understanding Disposition:  SNF when ok with cardiology team   Consultants:  Cardiology   Procedures:  PICC line placement   Antimicrobials:    Subjective: Pt says that he is very confused at times in the hospital. He has a large  BM after enema yesterday.      Objective: Vitals:   09/20/23 2136 09/21/23 0450 09/21/23 0947 09/21/23 1333  BP: (!) 102/51 (!) 100/43 (!) 99/51 (!) 97/58  Pulse: 73 84 64 81  Resp:  18  20  Temp:  97.7 F (36.5 C)  (!) 97.5 F (36.4 C)  TempSrc:  Oral  Oral  SpO2:  95%  96%  Weight:  (!) 172.1 kg    Height:        Intake/Output Summary (Last 24 hours) at 09/21/2023 1607 Last data filed at 09/21/2023 0800 Gross  per 24 hour  Intake 600 ml  Output 1950 ml  Net -1350 ml   Filed Weights   09/19/23 0500 09/20/23 0443 09/21/23 0450  Weight: (!) 175.7 kg (!) 174 kg (!) 172.1 kg   Examination:  General exam: morbidly obese male, awake, alert, oriented x 3, Appears calm and comfortable  Respiratory system: Clear to auscultation. Respiratory effort normal. Cardiovascular system: normal S1 & S2 heard. No JVD, murmurs, rubs, gallops or clicks. No pedal edema. Gastrointestinal system: Abdomen is nondistended, soft and nontender. No organomegaly or masses felt. Normal bowel sounds heard. Central nervous system: Alert and oriented. No focal neurological deficits. Extremities: 2+ edema BLEs (pitting) Symmetric 5 x 5 power. Skin: No rashes, lesions or ulcers. Psychiatry: Judgement and insight appear normal. Mood & affect flat, irritable.   Data Reviewed: I have personally reviewed following labs and imaging studies  CBC: Recent Labs  Lab 09/17/23 0419 09/18/23 0841 09/19/23 0419 09/20/23 0405 09/21/23 0442  WBC 6.6 5.9 6.4 6.4 5.9  HGB 7.3* 8.1* 7.2* 7.5* 7.3*  HCT 30.3* 31.8* 29.9* 31.0* 30.2*  MCV 87.1 87.4 87.7 88.3 88.0  PLT 204 202 206 219 225    Basic Metabolic Panel: Recent Labs  Lab 09/15/23 0329 09/16/23 0438 09/17/23 0419 09/18/23 0841 09/19/23 0419 09/20/23 0405 09/21/23 0442  NA 144 145 145 141 141 144 143  K 3.5 3.5 3.4* 3.2* 3.7 3.9 4.1  CL 99 99 100 99 97* 101 101  CO2 34* 37* 35* 33* 35* 38* 36*  GLUCOSE 115* 128* 112* 137* 154* 111* 111*  BUN 25* 26* 28* 29* 26* 27* 28*  CREATININE 1.18 1.07 1.15 1.16 1.10 1.11 1.10  CALCIUM  9.4 9.2 9.3 9.3 9.5 9.8 9.7  MG 2.4 2.4 2.6*  --  2.7*  --   --     CBG: No results for input(s): GLUCAP in the last 168 hours.  No results found for this or any previous visit (from the past 240 hours).   Radiology Studies: No results found.   Scheduled Meds:  acetaZOLAMIDE   500 mg Intravenous Daily   allopurinol   300 mg Oral Daily    docusate sodium   100 mg Oral BID   ferrous sulfate   325 mg Oral Q breakfast   folic acid   1 mg Oral Daily   leptospermum manuka honey  1 Application Topical Daily   melatonin  3 mg Oral QHS   metoprolol  tartrate  12.5 mg Oral BID   multivitamin with minerals  1 tablet Oral Daily   nystatin    Topical BID   pantoprazole   40 mg Oral Daily   potassium chloride   40 mEq Oral BID   rivaroxaban   20 mg Oral Daily   rosuvastatin   20 mg Oral Daily   sodium chloride  flush  10-40 mL Intracatheter Q12H   thiamine   100 mg Oral Daily   Or   thiamine   100 mg  Intravenous Daily   Continuous Infusions:  furosemide  (LASIX ) 200 mg in dextrose  5 % 100 mL (2 mg/mL) infusion 15 mg/hr (09/21/23 0532)    LOS: 13 days   Time spent: 55 mins  Lesbia Ottaway Vicci, MD How to contact the [facility] Attending or Consulting provider 7A - 7P or covering provider during after hours 7P -7A, for this patient?  Check the care team in [facility] and look for a) attending/consulting TRH provider listed and b) the TRH team listed Log into [email or web address] to find provider on call.  Locate the TRH provider you are looking for under [facility] and page to a number that you can be directly reached. If you still have difficulty reaching the provider, please page the [facility] (Director on Call) for the Hospitalists listed on amion for assistance.  09/21/2023, 4:07 PM

## 2023-09-21 NOTE — TOC CM/SW Note (Signed)
 Auth started Member ID [id number] Pending ref # [id number] Supporting documents sent via fax: [phone] Fax confirmation received.

## 2023-09-22 DIAGNOSIS — I5033 Acute on chronic diastolic (congestive) heart failure: Secondary | ICD-10-CM | POA: Diagnosis not present

## 2023-09-22 DIAGNOSIS — I4821 Permanent atrial fibrillation: Secondary | ICD-10-CM | POA: Diagnosis not present

## 2023-09-22 DIAGNOSIS — I10 Essential (primary) hypertension: Secondary | ICD-10-CM | POA: Diagnosis not present

## 2023-09-22 DIAGNOSIS — D649 Anemia, unspecified: Secondary | ICD-10-CM | POA: Diagnosis not present

## 2023-09-22 LAB — RPR: RPR Ser Ql: NONREACTIVE

## 2023-09-22 LAB — CBC
HCT: 33.1 % — ABNORMAL LOW (ref 39.0–52.0)
Hemoglobin: 8.3 g/dL — ABNORMAL LOW (ref 13.0–17.0)
MCH: 22.3 pg — ABNORMAL LOW (ref 26.0–34.0)
MCHC: 25.1 g/dL — ABNORMAL LOW (ref 30.0–36.0)
MCV: 88.7 fL (ref 80.0–100.0)
Platelets: 252 10*3/uL (ref 150–400)
RBC: 3.73 MIL/uL — ABNORMAL LOW (ref 4.22–5.81)
RDW: 23.9 % — ABNORMAL HIGH (ref 11.5–15.5)
WBC: 7.2 10*3/uL (ref 4.0–10.5)
nRBC: 0 % (ref 0.0–0.2)

## 2023-09-22 LAB — BASIC METABOLIC PANEL WITH GFR
Anion gap: 9 (ref 5–15)
BUN: 29 mg/dL — ABNORMAL HIGH (ref 8–23)
CO2: 35 mmol/L — ABNORMAL HIGH (ref 22–32)
Calcium: 9.9 mg/dL (ref 8.9–10.3)
Chloride: 98 mmol/L (ref 98–111)
Creatinine, Ser: 1.12 mg/dL (ref 0.61–1.24)
GFR, Estimated: >60 mL/min (ref >60)
Glucose, Bld: 99 mg/dL (ref 70–99)
Potassium: 3.9 mmol/L (ref 3.5–5.1)
Sodium: 142 mmol/L (ref 135–145)

## 2023-09-22 LAB — SEDIMENTATION RATE: Sed Rate: 48 mm/h — ABNORMAL HIGH (ref 0–20)

## 2023-09-22 LAB — MAGNESIUM: Magnesium: 3 mg/dL — ABNORMAL HIGH (ref 1.7–2.4)

## 2023-09-22 NOTE — Progress Notes (Addendum)
 PROGRESS NOTE   Frederick Brown  FMW:[id number] DOB: 1951/03/20 DOA: 09/08/2023 PCP: [name], MD   Chief Complaint  Patient presents with   Weakness   Level of care: Telemetry  Brief Admission History:  73 y.o. male with medical history significant for diastolic CHF, alcohol abuse, atrial fibrillation, hypertension, gout, morbid obesity.  Presented with generalized weakness, shortness of breath and worsening leg swelling.  He had not been compliant with his furosemide  at home.  Drinks about 2 glasses of vodka on a daily basis.  He was hospitalized for further management of volume overload.    Assessment and Plan:  Acute on chronic diastolic CHF/acute respiratory failure with hypoxia Pt admitted to noncomliance with diuretic Last echo from 2018 showed LVEF of 55%. Repeated echo with normal LVEF but with evidence of volume overload Strict ins and outs and daily weights. -Pt has not been compliant with fluid restriction  Filed Weights   09/20/23 0443 09/21/23 0450 09/22/23 0506  Weight: (!) 174 kg (!) 172.1 kg (!) 172.1 kg    Intake/Output Summary (Last 24 hours) at 09/22/2023 1134 Last data filed at 09/22/2023 1100 Gross per 24 hour  Intake 1680 ml  Output 3750 ml  Net -2070 ml   CVP not being measured anymore because patient pulled out PICC line  Remains vol overloaded but CVP coming down with diuresis and weight is coming down  Cardiology consulted 6/13 with recommendations to transition to Lasix  drip which he remains on at this time - Currently on 4 L nasal cannula oxygen  and typically on room air at home - Continues to have aggressive diuresis and Lasix  drip increased to 18 mg/h by cardiology on 6/23 - PICC line pulled out by patient, IV lasix  infusion and Diamox  dosing per cardiology - CVP was monitored by cardiology team now via PICC line however PICC was pulled out by patient    Alcohol abuse Counseled.  Pt claims that he has only 2 drinks every evening while  cooking his meal.  Continue CIWA protocol thiamine  folate multivitamins.  No evidence for withdrawal.   Persistent atrial fibrillation Noted to be on metoprolol .  Borderline low blood pressures noted.   Anticoagulated with Xarelto .  Low hemoglobin noted.  Chronic Constipation - s/p molasses enema with large BM    Normocytic anemia Hemoglobin of 8.1 today.  No bleeding episodes.  Patient denies any black stools.  Stool was Hemoccult negative.  Patient reported that he had a colonoscopy within the last 2 to 3 years and which was unremarkable. -On chart review, noted colonoscopy in 2009 with 3 tubular adenomas  Iron  level is low at 16 with sat of 4%. S/p dose of IV iron . On iron  supplementation -Follow-up a.m. CBC, may require transfusion by am if Hgb<7   Chronic kidney disease stage IIIa Renal function is stable with diuresis.  Avoid nephrotoxic agents. Monitor repeat labs each morning   Essential hypertension Monitor blood pressures closely.   History of gout Continue allopurinol .   Morbid obesity Estimated body mass index is 58.01 kg/m as calculated from the following:   Height as of this encounter: 5' 10 (1.778 m).   Weight as of this encounter: 183.4 kg.   DVT prophylaxis: rivaroxaban  Code Status: Full  Communication: discussed plan of care with patient who verbalized understanding Disposition:  SNF when ok with cardiology team   Consultants:  Cardiology   Procedures:  PICC line placement   Antimicrobials:    Subjective: Pt remains intermittently confused.  Objective: Vitals:   09/21/23 1333 09/21/23 1919 09/21/23 1953 09/22/23 0506  BP: (!) 97/58 108/75 108/75 (!) 125/40  Pulse: 81 80 80 65  Resp: 20 18 18 18   Temp: (!) 97.5 F (36.4 C) 97.8 F (36.6 C) 97.8 F (36.6 C) (!) 97.5 F (36.4 C)  TempSrc: Oral Oral Oral Oral  SpO2: 96% 93% 94% 93%  Weight:    (!) 172.1 kg  Height:        Intake/Output Summary (Last 24 hours) at 09/22/2023 1134 Last  data filed at 09/22/2023 1100 Gross per 24 hour  Intake 1680 ml  Output 3750 ml  Net -2070 ml   Filed Weights   09/20/23 0443 09/21/23 0450 09/22/23 0506  Weight: (!) 174 kg (!) 172.1 kg (!) 172.1 kg   Examination:  General exam: morbidly obese male, awake, alert, oriented x 3, Appears calm and comfortable  Respiratory system: Clear to auscultation. Respiratory effort normal. Cardiovascular system: normal S1 & S2 heard. No JVD, murmurs, rubs, gallops or clicks. No pedal edema. Gastrointestinal system: Abdomen is nondistended, soft and nontender. No organomegaly or masses felt. Normal bowel sounds heard. Central nervous system: Alert and oriented. No focal neurological deficits. Extremities: 2+ edema BLEs (pitting) Symmetric 5 x 5 power. Skin: No rashes, lesions or ulcers. Psychiatry: Judgement and insight appear normal. Mood & affect flat, irritable.   Data Reviewed: I have personally reviewed following labs and imaging studies  CBC: Recent Labs  Lab 09/18/23 0841 09/19/23 0419 09/20/23 0405 09/21/23 0442 09/22/23 0304  WBC 5.9 6.4 6.4 5.9 7.2  HGB 8.1* 7.2* 7.5* 7.3* 8.3*  HCT 31.8* 29.9* 31.0* 30.2* 33.1*  MCV 87.4 87.7 88.3 88.0 88.7  PLT 202 206 219 225 252    Basic Metabolic Panel: Recent Labs  Lab 09/16/23 0438 09/17/23 0419 09/18/23 0841 09/19/23 0419 09/20/23 0405 09/21/23 0442 09/22/23 0304  NA 145 145 141 141 144 143 142  K 3.5 3.4* 3.2* 3.7 3.9 4.1 3.9  CL 99 100 99 97* 101 101 98  CO2 37* 35* 33* 35* 38* 36* 35*  GLUCOSE 128* 112* 137* 154* 111* 111* 99  BUN 26* 28* 29* 26* 27* 28* 29*  CREATININE 1.07 1.15 1.16 1.10 1.11 1.10 1.12  CALCIUM  9.2 9.3 9.3 9.5 9.8 9.7 9.9  MG 2.4 2.6*  --  2.7*  --   --  3.0*    CBG: No results for input(s): GLUCAP in the last 168 hours.  No results found for this or any previous visit (from the past 240 hours).   Radiology Studies: No results found.   Scheduled Meds:  allopurinol   300 mg Oral Daily    docusate sodium   100 mg Oral BID   ferrous sulfate   325 mg Oral Q breakfast   folic acid   1 mg Oral Daily   leptospermum manuka honey  1 Application Topical Daily   melatonin  3 mg Oral QHS   metoprolol  tartrate  12.5 mg Oral BID   multivitamin with minerals  1 tablet Oral Daily   nystatin    Topical BID   pantoprazole   40 mg Oral Daily   potassium chloride   40 mEq Oral BID   rivaroxaban   20 mg Oral Daily   rosuvastatin   20 mg Oral Daily   sodium chloride  flush  10-40 mL Intracatheter Q12H   thiamine   100 mg Oral Daily   Or   thiamine   100 mg Intravenous Daily   Continuous Infusions:  furosemide  (LASIX ) 200 mg in  dextrose  5 % 100 mL (2 mg/mL) infusion 18 mg/hr (09/22/23 1038)    LOS: 14 days   Time spent: 55 mins  Kaliah Haddaway Vicci, MD How to contact the [facility] Attending or Consulting provider 7A - 7P or covering provider during after hours 7P -7A, for this patient?  Check the care team in [facility] and look for a) attending/consulting TRH provider listed and b) the TRH team listed Log into [email or web address] to find provider on call.  Locate the TRH provider you are looking for under [facility] and page to a number that you can be directly reached. If you still have difficulty reaching the provider, please page the [facility] (Director on Call) for the Hospitalists listed on amion for assistance.  09/22/2023, 11:34 AM

## 2023-09-22 NOTE — Progress Notes (Signed)
 Rounding Note   Patient Name: Frederick Brown Date of Encounter: 09/22/2023  [facility] Cardiologist: Maude Emmer, MD   Subjective Some ongoing SOB  Scheduled Meds:  acetaZOLAMIDE   500 mg Intravenous Daily   allopurinol   300 mg Oral Daily   docusate sodium   100 mg Oral BID   ferrous sulfate   325 mg Oral Q breakfast   folic acid   1 mg Oral Daily   leptospermum manuka honey  1 Application Topical Daily   melatonin  3 mg Oral QHS   metoprolol  tartrate  12.5 mg Oral BID   multivitamin with minerals  1 tablet Oral Daily   nystatin    Topical BID   pantoprazole   40 mg Oral Daily   potassium chloride   40 mEq Oral BID   rivaroxaban   20 mg Oral Daily   rosuvastatin   20 mg Oral Daily   sodium chloride  flush  10-40 mL Intracatheter Q12H   thiamine   100 mg Oral Daily   Or   thiamine   100 mg Intravenous Daily   Continuous Infusions:  furosemide  (LASIX ) 200 mg in dextrose  5 % 100 mL (2 mg/mL) infusion 15 mg/hr (09/21/23 0532)   PRN Meds: acetaminophen  **OR** acetaminophen , guaiFENesin -dextromethorphan , ondansetron  **OR** ondansetron  (ZOFRAN ) IV, polyethylene glycol, sodium chloride  flush   Vital Signs  Vitals:   09/21/23 1333 09/21/23 1919 09/21/23 1953 09/22/23 0506  BP: (!) 97/58 108/75 108/75 (!) 125/40  Pulse: 81 80 80 65  Resp: 20 18 18 18   Temp: (!) 97.5 F (36.4 C) 97.8 F (36.6 C) 97.8 F (36.6 C) (!) 97.5 F (36.4 C)  TempSrc: Oral Oral Oral Oral  SpO2: 96% 93% 94% 93%  Weight:    (!) 172.1 kg  Height:        Intake/Output Summary (Last 24 hours) at 09/22/2023 1004 Last data filed at 09/22/2023 0900 Gross per 24 hour  Intake 1680 ml  Output 4200 ml  Net -2520 ml      09/22/2023    5:06 AM 09/21/2023    4:50 AM 09/20/2023    4:43 AM  Last 3 Weights  Weight (lbs) 379 lb 6.6 oz 379 lb 6.6 oz 383 lb 9.6 oz  Weight (kg) 172.1 kg 172.1 kg 174 kg      Telemetry Rate controlled afib - Personally Reviewed  ECG  N/a - Personally Reviewed  Physical  Exam  GEN: No acute distress.   Neck: + JVD Cardiac: irreg Respiratory: Clear to auscultation bilaterally. GI: Soft, nontender, non-distended  MS: 2+ bilateral LE edema Neuro:  Nonfocal  Psych: Normal affect   Labs High Sensitivity Troponin:   Recent Labs  Lab 09/08/23 1719 09/08/23 2128  TROPONINIHS 9 9     Chemistry Recent Labs  Lab 09/17/23 0419 09/18/23 0841 09/19/23 0419 09/20/23 0405 09/21/23 0442 09/22/23 0304  NA 145   < > 141 144 143 142  K 3.4*   < > 3.7 3.9 4.1 3.9  CL 100   < > 97* 101 101 98  CO2 35*   < > 35* 38* 36* 35*  GLUCOSE 112*   < > 154* 111* 111* 99  BUN 28*   < > 26* 27* 28* 29*  CREATININE 1.15   < > 1.10 1.11 1.10 1.12  CALCIUM  9.3   < > 9.5 9.8 9.7 9.9  MG 2.6*  --  2.7*  --   --  3.0*  GFRNONAA >60   < > >60 >60 >60 >60  ANIONGAP 10   < >  9 5 6 9    < > = values in this interval not displayed.    Lipids No results for input(s): CHOL, TRIG, HDL, LABVLDL, LDLCALC, CHOLHDL in the last 168 hours.  Hematology Recent Labs  Lab 09/20/23 0405 09/21/23 0442 09/22/23 0304  WBC 6.4 5.9 7.2  RBC 3.51* 3.43* 3.73*  HGB 7.5* 7.3* 8.3*  HCT 31.0* 30.2* 33.1*  MCV 88.3 88.0 88.7  MCH 21.4* 21.3* 22.3*  MCHC 24.2* 24.2* 25.1*  RDW 23.5* 23.5* 23.9*  PLT 219 225 252   Thyroid  No results for input(s): TSH, FREET4 in the last 168 hours.  BNPNo results for input(s): BNP, PROBNP in the last 168 hours.  DDimer No results for input(s): DDIMER in the last 168 hours.   Radiology  No results found.    Patient Profile   Frederick Brown is a 73 y.o. male with a hx of permanent atrial fibrillation, chronic HFpEF, HTN, HLD, alcohol use and OSA who is being seen 09/12/2023 for the evaluation of acute HFpEF at the request of Dr. Cindy.   Assessment & Plan  1.Acute on chronic HFpEF/RV failure - 08/2023 echo: LVEF 55-60%, no WMAs, indet diastolic function, D shaped septum, mod RV dysfunction, mod pulm HTN, mild LAE, mild MR, mod to  severe TR  -BNP 875, CXR +interstitial edema, small right and trace left pleural effusion - exacerbation in diuretic noncompliance at home  -on lasix  gtt at 15mg /hr, acetazolamide  500mg  daily. Negative 2L [place], 21L since admission. Renal function is stable. Not able to stand for weights, bed weights 407-->379 lbs - net diuresis slowing down, increase lasix  gtt to 18mg /hr. Remains massively volume overloaded.  - consider SGLT2i closer to discharge.     - suspect pulmonary HTN and RV dysfunction related to LV diastolic dysfunction, OSA not treated. Very likely given BMI may have a component of OHS.  - Would plan on outpatient PFTs, sleep study, VQ scan.    2.Chronic afib - rate controlled, on xarelto  for stroke prevention       3. Anemia - admit Hgb 7.6, 1 year ago Hgb 11 - ferritin 5, FOBT neg - given IV iron  For questions or updates, please contact [facility] Please consult [email or web address] for contact info under     Signed, Alvan Carrier, MD  09/22/2023, 10:04 AM

## 2023-09-22 NOTE — Progress Notes (Signed)
 Blister filled with serous fluid on right posterior upper thigh opened. Placed foam dressing.

## 2023-09-23 DIAGNOSIS — I5033 Acute on chronic diastolic (congestive) heart failure: Secondary | ICD-10-CM | POA: Diagnosis not present

## 2023-09-23 DIAGNOSIS — I10 Essential (primary) hypertension: Secondary | ICD-10-CM | POA: Diagnosis not present

## 2023-09-23 DIAGNOSIS — K5909 Other constipation: Secondary | ICD-10-CM

## 2023-09-23 DIAGNOSIS — E8770 Fluid overload, unspecified: Secondary | ICD-10-CM | POA: Diagnosis not present

## 2023-09-23 LAB — CBC
HCT: 33.9 % — ABNORMAL LOW (ref 39.0–52.0)
Hemoglobin: 8.3 g/dL — ABNORMAL LOW (ref 13.0–17.0)
MCH: 22.2 pg — ABNORMAL LOW (ref 26.0–34.0)
MCHC: 24.5 g/dL — ABNORMAL LOW (ref 30.0–36.0)
MCV: 90.6 fL (ref 80.0–100.0)
Platelets: 266 10*3/uL (ref 150–400)
RBC: 3.74 MIL/uL — ABNORMAL LOW (ref 4.22–5.81)
RDW: 23.9 % — ABNORMAL HIGH (ref 11.5–15.5)
WBC: 6.3 10*3/uL (ref 4.0–10.5)
nRBC: 0 % (ref 0.0–0.2)

## 2023-09-23 LAB — BLOOD GAS, VENOUS
Acid-Base Excess: 18.7 mmol/L — ABNORMAL HIGH (ref 0.0–2.0)
Bicarbonate: 47.8 mmol/L — ABNORMAL HIGH (ref 20.0–28.0)
Drawn by: 442
O2 Saturation: 40.1 %
Patient temperature: 36.2
pCO2, Ven: 92 mmHg (ref 44–60)
pH, Ven: 7.32 (ref 7.25–7.43)
pO2, Ven: <31 mmHg — CL (ref 32–45)

## 2023-09-23 LAB — BASIC METABOLIC PANEL WITH GFR
Anion gap: 10 (ref 5–15)
BUN: 26 mg/dL — ABNORMAL HIGH (ref 8–23)
CO2: 37 mmol/L — ABNORMAL HIGH (ref 22–32)
Calcium: 9.7 mg/dL (ref 8.9–10.3)
Chloride: 97 mmol/L — ABNORMAL LOW (ref 98–111)
Creatinine, Ser: 1.12 mg/dL (ref 0.61–1.24)
GFR, Estimated: >60 mL/min (ref >60)
Glucose, Bld: 109 mg/dL — ABNORMAL HIGH (ref 70–99)
Potassium: 4.1 mmol/L (ref 3.5–5.1)
Sodium: 144 mmol/L (ref 135–145)

## 2023-09-23 LAB — AMMONIA: Ammonia: 29 umol/L (ref 9–35)

## 2023-09-23 MED ORDER — POTASSIUM CHLORIDE CRYS ER 20 MEQ PO TBCR
40.0000 meq | EXTENDED_RELEASE_TABLET | Freq: Every day | ORAL | Status: DC
  Administered 2023-09-24 – 2023-09-28 (×5): 40 meq via ORAL
  Filled 2023-09-23 (×5): qty 2

## 2023-09-23 MED ORDER — METOLAZONE 5 MG PO TABS
2.5000 mg | ORAL_TABLET | Freq: Once | ORAL | Status: AC
  Administered 2023-09-23: 2.5 mg via ORAL
  Filled 2023-09-23: qty 1

## 2023-09-23 NOTE — Progress Notes (Signed)
 Rounding Note   Patient Name: Frederick Brown Date of Encounter: 09/23/2023  [facility] Cardiologist: Maude Emmer, MD   Subjective Some ongoing SOB  Scheduled Meds:  allopurinol   300 mg Oral Daily   docusate sodium   100 mg Oral BID   ferrous sulfate   325 mg Oral Q breakfast   folic acid   1 mg Oral Daily   leptospermum manuka honey  1 Application Topical Daily   melatonin  3 mg Oral QHS   metoprolol  tartrate  12.5 mg Oral BID   multivitamin with minerals  1 tablet Oral Daily   nystatin    Topical BID   pantoprazole   40 mg Oral Daily   potassium chloride   40 mEq Oral BID   rivaroxaban   20 mg Oral Daily   rosuvastatin   20 mg Oral Daily   sodium chloride  flush  10-40 mL Intracatheter Q12H   thiamine   100 mg Oral Daily   Or   thiamine   100 mg Intravenous Daily   Continuous Infusions:  furosemide  (LASIX ) 200 mg in dextrose  5 % 100 mL (2 mg/mL) infusion 18 mg/hr (09/23/23 0826)   PRN Meds: acetaminophen  **OR** acetaminophen , guaiFENesin -dextromethorphan , ondansetron  **OR** ondansetron  (ZOFRAN ) IV, polyethylene glycol, sodium chloride  flush   Vital Signs  Vitals:   09/22/23 1748 09/22/23 2015 09/23/23 0320 09/23/23 0427  BP: (!) 122/42 120/62 (!) 108/56   Pulse:  79 71   Resp:  16 18   Temp:  97.6 F (36.4 C)    TempSrc:  Oral    SpO2:  98%    Weight:    (!) 142 kg  Height:        Intake/Output Summary (Last 24 hours) at 09/23/2023 0828 Last data filed at 09/23/2023 0641 Gross per 24 hour  Intake 840 ml  Output 2750 ml  Net -1910 ml      09/23/2023    4:27 AM 09/22/2023    5:06 AM 09/21/2023    4:50 AM  Last 3 Weights  Weight (lbs) 313 lb 0.9 oz 379 lb 6.6 oz 379 lb 6.6 oz  Weight (kg) 142 kg 172.1 kg 172.1 kg      Telemetry Rate controlled afib - Personally Reviewed  ECG  N/a - Personally Reviewed  Physical Exam  GEN: No acute distress.   Neck: + JVD Cardiac: irreg Respiratory: crackles bilateral bases GI: Soft, nontender, non-distended   MS: 1-2+ bilateral LE edema Neuro:  Nonfocal  Psych: Normal affect   Labs High Sensitivity Troponin:   Recent Labs  Lab 09/08/23 1719 09/08/23 2128  TROPONINIHS 9 9     Chemistry Recent Labs  Lab 09/17/23 0419 09/18/23 0841 09/19/23 0419 09/20/23 0405 09/21/23 0442 09/22/23 0304 09/23/23 0432  NA 145   < > 141   < > 143 142 144  K 3.4*   < > 3.7   < > 4.1 3.9 4.1  CL 100   < > 97*   < > 101 98 97*  CO2 35*   < > 35*   < > 36* 35* 37*  GLUCOSE 112*   < > 154*   < > 111* 99 109*  BUN 28*   < > 26*   < > 28* 29* 26*  CREATININE 1.15   < > 1.10   < > 1.10 1.12 1.12  CALCIUM  9.3   < > 9.5   < > 9.7 9.9 9.7  MG 2.6*  --  2.7*  --   --  3.0*  --  GFRNONAA >60   < > >60   < > >60 >60 >60  ANIONGAP 10   < > 9   < > 6 9 10    < > = values in this interval not displayed.    Lipids No results for input(s): CHOL, TRIG, HDL, LABVLDL, LDLCALC, CHOLHDL in the last 168 hours.  Hematology Recent Labs  Lab 09/21/23 0442 09/22/23 0304 09/23/23 0432  WBC 5.9 7.2 6.3  RBC 3.43* 3.73* 3.74*  HGB 7.3* 8.3* 8.3*  HCT 30.2* 33.1* 33.9*  MCV 88.0 88.7 90.6  MCH 21.3* 22.3* 22.2*  MCHC 24.2* 25.1* 24.5*  RDW 23.5* 23.9* 23.9*  PLT 225 252 266   Thyroid  No results for input(s): TSH, FREET4 in the last 168 hours.  BNPNo results for input(s): BNP, PROBNP in the last 168 hours.  DDimer No results for input(s): DDIMER in the last 168 hours.   Radiology  No results found.  Cardiac Studies   Patient Profile   Frederick Brown is a 73 y.o. male with a hx of permanent atrial fibrillation, chronic HFpEF, HTN, HLD, alcohol use and OSA who is being seen 09/12/2023 for the evaluation of acute HFpEF at the request of Dr. Cindy.     Assessment & Plan  1.Acute on chronic HFpEF/RV failure - 08/2023 echo: LVEF 55-60%, no WMAs, indet diastolic function, D shaped septum, mod RV dysfunction, mod pulm HTN, mild LAE, mild MR, mod to severe TR  -BNP 875, CXR +interstitial edema,  small right and trace left pleural effusion - exacerbation in diuretic noncompliance at home   -on lasix  gtt at 18mg /hr, Negative 2.7L [place], 24L since admission. Renal function is stable. Not able to stand for weights, bed weights 407-->379 lbs yesterday, 313 lbs documented today looks to be an error, asking nursing to clarify.  - add metolazone  2.5mg  x 1 today, continue lasix  gtt.  - consider SGLT2i closer to discharge.     - suspect pulmonary HTN and RV dysfunction related to LV diastolic dysfunction, OSA not treated. Very likely given BMI may have a component of OHS.  - Would plan on outpatient PFTs, sleep study, VQ scan.    2.Chronic afib - rate controlled, on xarelto  for stroke prevention       3. Anemia - admit Hgb 7.6, 1 year ago Hgb 11 - ferritin 5, FOBT neg - given IV iron     For questions or updates, please contact [facility] Please consult [email or web address] for contact info under     Signed, Alvan Carrier, MD  09/23/2023, 8:28 AM

## 2023-09-23 NOTE — Progress Notes (Signed)
 Date and time results received: 09/23/23 1130  (use smartphrase .now to insert current time)  Test: Pco2 and Po2 VEN Critical Value: 92, <31   Name of Provider Notified: Vicci Pen MD  Orders Received? Or Actions Taken?: CPAP Ordered

## 2023-09-23 NOTE — Plan of Care (Signed)
   Problem: Activity: Goal: Risk for activity intolerance will decrease Outcome: Progressing   Problem: Coping: Goal: Level of anxiety will decrease Outcome: Progressing   Problem: Safety: Goal: Ability to remain free from injury will improve Outcome: Progressing

## 2023-09-23 NOTE — Progress Notes (Addendum)
 PROGRESS NOTE   Frederick Brown  FMW:[id number] DOB: 10-03-1950 DOA: 09/08/2023 PCP: [name], MD   Chief Complaint  Patient presents with   Weakness   Level of care: Telemetry  Brief Admission History:  73 y.o. male with medical history significant for diastolic CHF, alcohol abuse, atrial fibrillation, hypertension, gout, morbid obesity.  Presented with generalized weakness, shortness of breath and worsening leg swelling.  He had not been compliant with his furosemide  at home.  Drinks about 2 glasses of vodka on a daily basis.  He was hospitalized for further management of volume overload.    Assessment and Plan:  Acute on chronic diastolic CHF/acute respiratory failure with hypoxia and hypercarbia Pt admitted to noncomliance with diuretic Last echo from 2018 showed LVEF of 55%. Repeated echo with normal LVEF but with evidence of volume overload Strict ins and outs and daily weights. -Pt has not been compliant with fluid restriction  Filed Weights   09/21/23 0450 09/22/23 0506 09/23/23 0427  Weight: (!) 172.1 kg (!) 172.1 kg (!) 142 kg    Intake/Output Summary (Last 24 hours) at 09/23/2023 2319 Last data filed at 09/23/2023 2137 Gross per 24 hour  Intake 1040 ml  Output 6750 ml  Net -5710 ml   CVP not being measured anymore because patient pulled out PICC line  Remains vol overloaded but CVP coming down with diuresis and weight is coming down  Cardiology consulted 6/13 with recommendations to transition to Lasix  drip which he remains on at this time - Currently on 4 L nasal cannula oxygen  and typically on room air at home - Continues to have aggressive diuresis and Lasix  drip increased to 18 mg/h by cardiology on 6/23 - PICC line pulled out by patient, IV lasix  infusion and Diamox /metolazone  dosing per cardiology - CVP was monitored by cardiology team now via PICC line however PICC was pulled out by patient    Alcohol abuse Counseled.  Pt claims that he has only 2  drinks every evening while cooking his meal.  Continue CIWA protocol thiamine  folate multivitamins.  No evidence for withdrawal.   Persistent atrial fibrillation Noted to be on metoprolol .  Borderline low blood pressures noted.   Anticoagulated with Xarelto .  Low hemoglobin noted but stable. S/p IV iron   Chronic Constipation - s/p molasses enema with large BM    Normocytic anemia Hemoglobin of 8.1 today.  No bleeding episodes.  Patient denies any black stools.  Stool was Hemoccult negative.  Patient reported that he had a colonoscopy within the last 2 to 3 years and which was unremarkable. -On chart review, noted colonoscopy in 2009 with 3 tubular adenomas  Iron  level is low at 16 with sat of 4%. S/p dose of IV iron . On iron  supplementation -Follow-up a.m. CBC, may require transfusion by am if Hgb<7   Chronic kidney disease stage IIIa Renal function is stable with diuresis.  Avoid nephrotoxic agents. Monitor repeat labs each morning   Essential hypertension Monitor blood pressures closely.   History of gout Continue allopurinol .   Morbid obesity Estimated body mass index is 58.01 kg/m as calculated from the following:   Height as of this encounter: 5' 10 (1.778 m).   Weight as of this encounter: 183.4 kg.   Obesity Hypoventilation Syndrome Ordered CPAP use while sleeping however patient refuses to use it  DVT prophylaxis: rivaroxaban  Code Status: Full  Communication: discussed plan of care with patient who verbalized understanding Disposition:  SNF when ok with cardiology team   Consultants:  Cardiology   Procedures:  PICC line placement (pt pulled out PICC)  Antimicrobials:    Subjective: Pt confused, says he feels better in bariatric bed.          Objective: Vitals:   09/23/23 0320 09/23/23 0427 09/23/23 1351 09/23/23 2022  BP: (!) 108/56  96/69 106/78  Pulse: 71  87 75  Resp: 18  19 20   Temp:   98 F (36.7 C) 97.8 F (36.6 C)  TempSrc:    Oral  SpO2:    95% 98%  Weight:  (!) 142 kg    Height:        Intake/Output Summary (Last 24 hours) at 09/23/2023 2319 Last data filed at 09/23/2023 2137 Gross per 24 hour  Intake 1040 ml  Output 6750 ml  Net -5710 ml   Filed Weights   09/21/23 0450 09/22/23 0506 09/23/23 0427  Weight: (!) 172.1 kg (!) 172.1 kg (!) 142 kg   Examination:  General exam: morbidly obese male, awake, alert, oriented x 2, Appears calm and comfortable  Respiratory system: Clear to auscultation. Respiratory effort normal. Cardiovascular system: normal S1 & S2 heard. No JVD, murmurs, rubs, gallops or clicks. No pedal edema. Gastrointestinal system: Abdomen is nondistended, soft and nontender. No organomegaly or masses felt. Normal bowel sounds heard. Central nervous system: Alert and oriented to person, place only. No focal neurological deficits. Extremities: 2+ edema BLEs (pitting) Symmetric 5 x 5 power. Skin: No rashes, lesions or ulcers. Psychiatry: Judgement and insight appear normal. Mood & affect flat, irritable.   Data Reviewed: I have personally reviewed following labs and imaging studies  CBC: Recent Labs  Lab 09/19/23 0419 09/20/23 0405 09/21/23 0442 09/22/23 0304 09/23/23 0432  WBC 6.4 6.4 5.9 7.2 6.3  HGB 7.2* 7.5* 7.3* 8.3* 8.3*  HCT 29.9* 31.0* 30.2* 33.1* 33.9*  MCV 87.7 88.3 88.0 88.7 90.6  PLT 206 219 225 252 266    Basic Metabolic Panel: Recent Labs  Lab 09/17/23 0419 09/18/23 0841 09/19/23 0419 09/20/23 0405 09/21/23 0442 09/22/23 0304 09/23/23 0432  NA 145   < > 141 144 143 142 144  K 3.4*   < > 3.7 3.9 4.1 3.9 4.1  CL 100   < > 97* 101 101 98 97*  CO2 35*   < > 35* 38* 36* 35* 37*  GLUCOSE 112*   < > 154* 111* 111* 99 109*  BUN 28*   < > 26* 27* 28* 29* 26*  CREATININE 1.15   < > 1.10 1.11 1.10 1.12 1.12  CALCIUM  9.3   < > 9.5 9.8 9.7 9.9 9.7  MG 2.6*  --  2.7*  --   --  3.0*  --    < > = values in this interval not displayed.    CBG: No results for input(s): GLUCAP in  the last 168 hours.  No results found for this or any previous visit (from the past 240 hours).   Radiology Studies: No results found.   Scheduled Meds:  allopurinol   300 mg Oral Daily   docusate sodium   100 mg Oral BID   ferrous sulfate   325 mg Oral Q breakfast   folic acid   1 mg Oral Daily   leptospermum manuka honey  1 Application Topical Daily   melatonin  3 mg Oral QHS   metoprolol  tartrate  12.5 mg Oral BID   multivitamin with minerals  1 tablet Oral Daily   nystatin    Topical BID   pantoprazole   40 mg Oral Daily   [START ON 09/24/2023] potassium chloride   40 mEq Oral Daily   rivaroxaban   20 mg Oral Daily   rosuvastatin   20 mg Oral Daily   thiamine   100 mg Oral Daily   Or   thiamine   100 mg Intravenous Daily   Continuous Infusions:  furosemide  (LASIX ) 200 mg in dextrose  5 % 100 mL (2 mg/mL) infusion 18 mg/hr (09/23/23 1947)    LOS: 15 days   Time spent: 50 mins  Lennart Gladish Vicci, MD How to contact the [facility] Attending or Consulting provider 7A - 7P or covering provider during after hours 7P -7A, for this patient?  Check the care team in [facility] and look for a) attending/consulting TR[facility] provider listed and b) the TRH team listed Log into [email or web address] to find provider on call.  Locate the TRH provider you are looking for under [facility] and page to a number that you can be directly reached. If you still have difficulty reaching the provider, please page the [facility] (Director on Call) for the Hospitalists listed on amion for assistance.  09/23/2023, 11:19 PM

## 2023-09-23 NOTE — Progress Notes (Signed)
 PT Cancellation Note  Patient Details Name: Frederick Brown MRN: [id number] DOB: October 31, 1950   Cancelled Treatment:    Reason Eval/Treat Not Completed: Patient declined, no reason specified. Patient declined therapy and states he does not want to get up.  Patient encouraged to at least sit up at bedside to eat lunch, but continued to decline - nurse, MD notified.  2:16 PM, 09/23/23 Lynwood Music, MPT Physical Therapist with [facility] [phone] office [phone] mobile phone

## 2023-09-23 NOTE — Plan of Care (Signed)
  Problem: Education: Goal: Knowledge of General Education information will improve Description: Including pain rating scale, medication(s)/side effects and non-pharmacologic comfort measures Outcome: Progressing   Problem: Clinical Measurements: Goal: Diagnostic test results will improve Outcome: Progressing   

## 2023-09-23 NOTE — Progress Notes (Signed)
 RT Note: Spoke with pt about wearing CPAP tonight. He stated he would try but he could not tolerate last time he tried it. Pt placed on Auto Bipap via nasal mask. Pt wore about 5 minutes before stating it would not work. Offered pt a full face mask to try, after it was set up he declined. Pt placed back on 4L [place]. Machine in room on standby.

## 2023-09-24 ENCOUNTER — Encounter (HOSPITAL_COMMUNITY): Payer: Self-pay | Admitting: Internal Medicine

## 2023-09-24 DIAGNOSIS — Z515 Encounter for palliative care: Secondary | ICD-10-CM | POA: Diagnosis not present

## 2023-09-24 DIAGNOSIS — I4821 Permanent atrial fibrillation: Secondary | ICD-10-CM | POA: Diagnosis not present

## 2023-09-24 DIAGNOSIS — Z7189 Other specified counseling: Secondary | ICD-10-CM | POA: Diagnosis not present

## 2023-09-24 DIAGNOSIS — I5033 Acute on chronic diastolic (congestive) heart failure: Secondary | ICD-10-CM | POA: Diagnosis not present

## 2023-09-24 LAB — BASIC METABOLIC PANEL WITH GFR
Anion gap: 10 (ref 5–15)
BUN: 28 mg/dL — ABNORMAL HIGH (ref 8–23)
CO2: 40 mmol/L — ABNORMAL HIGH (ref 22–32)
Calcium: 9.7 mg/dL (ref 8.9–10.3)
Chloride: 91 mmol/L — ABNORMAL LOW (ref 98–111)
Creatinine, Ser: 1.15 mg/dL (ref 0.61–1.24)
GFR, Estimated: >60 mL/min (ref >60)
Glucose, Bld: 104 mg/dL — ABNORMAL HIGH (ref 70–99)
Potassium: 3.3 mmol/L — ABNORMAL LOW (ref 3.5–5.1)
Sodium: 141 mmol/L (ref 135–145)

## 2023-09-24 LAB — CBC
HCT: 30.7 % — ABNORMAL LOW (ref 39.0–52.0)
Hemoglobin: 7.8 g/dL — ABNORMAL LOW (ref 13.0–17.0)
MCH: 22.3 pg — ABNORMAL LOW (ref 26.0–34.0)
MCHC: 25.4 g/dL — ABNORMAL LOW (ref 30.0–36.0)
MCV: 88 fL (ref 80.0–100.0)
Platelets: 275 10*3/uL (ref 150–400)
RBC: 3.49 MIL/uL — ABNORMAL LOW (ref 4.22–5.81)
RDW: 23.9 % — ABNORMAL HIGH (ref 11.5–15.5)
WBC: 6.9 10*3/uL (ref 4.0–10.5)
nRBC: 0 % (ref 0.0–0.2)

## 2023-09-24 LAB — MAGNESIUM: Magnesium: 2.8 mg/dL — ABNORMAL HIGH (ref 1.7–2.4)

## 2023-09-24 MED ORDER — ACETAZOLAMIDE SODIUM 500 MG IJ SOLR
500.0000 mg | Freq: Once | INTRAMUSCULAR | Status: AC
  Administered 2023-09-24: 500 mg via INTRAVENOUS
  Filled 2023-09-24: qty 500

## 2023-09-24 MED ORDER — METOLAZONE 5 MG PO TABS
2.5000 mg | ORAL_TABLET | Freq: Once | ORAL | Status: AC
  Administered 2023-09-24: 2.5 mg via ORAL
  Filled 2023-09-24: qty 1

## 2023-09-24 MED ORDER — SENNA 8.6 MG PO TABS
2.0000 | ORAL_TABLET | Freq: Every day | ORAL | Status: DC
  Administered 2023-09-24 – 2023-09-28 (×5): 17.2 mg via ORAL
  Filled 2023-09-24 (×4): qty 2

## 2023-09-24 MED ORDER — POTASSIUM CHLORIDE CRYS ER 20 MEQ PO TBCR
40.0000 meq | EXTENDED_RELEASE_TABLET | Freq: Once | ORAL | Status: AC
  Administered 2023-09-24: 40 meq via ORAL
  Filled 2023-09-24: qty 2

## 2023-09-24 MED ORDER — POLYETHYLENE GLYCOL 3350 17 G PO PACK
17.0000 g | PACK | Freq: Every day | ORAL | Status: DC
  Administered 2023-09-24 – 2023-09-28 (×5): 17 g via ORAL
  Filled 2023-09-24 (×5): qty 1

## 2023-09-24 NOTE — Consult Note (Signed)
 Consultation Note Date: 09/24/2023   Patient Name: Frederick Brown  DOB: 11/08/1950  MRN: [id number]  Age / Sex: 73 y.o., male  PCP: Frederick Rush, MD Referring Physician: Evonnie Lenis, MD  Reason for Consultation: Establishing goals of care  HPI/Patient Profile: 73 y.o. male  with past medical history of permanent atrial fibrillation, chronic HFpEF, HTN, HLD, alcohol use and morbid obesity and OSA  admitted on 09/08/2023 with acute on chronic heart failure with preserved EF/RV failure on Lasix  infusion.   Clinical Assessment and Goals of Care: I have reviewed medical records including EPIC notes, labs and imaging, received report from RN, assessed the patient.  Frederick Brown is lying quietly in bed.  He appears acutely/chronically ill and frail, morbidly obese.  He greets me, making and mostly keeping eye contact.  He is alert and oriented, able to make his needs known.  There is no family at bedside at this time.  Face-to-face discussion with bedside nursing staff and transition of care team related to patient condition, needs.  We meet at the bedside to discuss diagnosis prognosis, GOC, EOL wishes, disposition and options.  I introduced Palliative Medicine as specialized medical care for people living with serious illness. It focuses on providing relief from the symptoms and stress of a serious illness. The goal is to improve quality of life for both the patient and the family.  We discussed a brief life review of the patient.  Frederick Brown tells me that he is unmarried and does not have children.  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  He retired at age 25, he states, due to neuropathy in his feet and legs.  He has been living independently, no longer drives.  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  We then focused on their current illness.  We talked about Frederick Brown acute on chronic heart failure and the treatment plan.  We talked about fluid overload, fluid restrictions.  We talked about approximately 40 pounds or more of fluid removed, but he has still more to go.  The natural disease trajectory and expectations at EOL were discussed.  I attempted to elicit values and goals of care important to the patient.  The difference between aggressive medical intervention and comfort care was considered in light of the patient's goals of care.   Advanced directives, concepts specific to code status, artifical feeding and hydration, and rehospitalization were considered and discussed.  We talked about concept of  treat the treatable, but allow a natural passing.  [name] that his health have not been bad, and he has never thought about this.  I share that, things are looking different now.  I encouraged him to think about, pray about what he does and does not want, what he wants this time to look like and feel like.  I encouraged him to share his wishes with his HCPOA.    Hospice and Palliative Care services outpatient were explained and offered.  We  talked about the benefits of outpatient palliative/hospice care.  We talked about what is and is not provided.  We discussed provider choice.  At this point Frederick Brown is open to outpatient palliative services, provider of choice [facility].   Discussed the importance of continued conversation with family and the medical providers regarding overall plan of care and treatment options, ensuring decisions are within the context of the patient's values and GOCs.  Questions and concerns were addressed.  Hard Choices booklet left for review. The family was encouraged to call with questions or concerns.  PMT will continue to support holistically.  Conference with attending, bedside nursing staff, transition of care team related to patient condition, needs, goals of care, disposition.    HCPOA  HCPOA -Frederick Brown  tells me that his friend and neighbor, Frederick Brown, is his legal healthcare power of attorney.  I encouraged him to have detailed discussions about what he does and does not want.    SUMMARY OF RECOMMENDATIONS   Continue to treat the treatable, considering CODE STATUS.  Would prefer to return home if able, shared that he must prove his ability to care for self.  Out patient palliative with [facility].    Code Status/Advance Care Planning: Full code - We talked about concept of  treat the treatable, but allow a natural passing.  Frederick Brown states that his health have not been bad, and he has never thought about this.  I share that, things are looking different now.  I encouraged him to think about, pray about what he does and does not want, what he wants this time to look like and feel like.  I encouraged him to share his wishes with his HCPOA.  Symptom Management:  Per hospitalist, no additional needs at this time.  Palliative Prophylaxis:  Oral Care and Turn Reposition  Additional Recommendations (Limitations, Scope, Preferences): Full Scope Treatment  Psycho-social/Spiritual:  Desire for further Chaplaincy support:no Additional Recommendations: Caregiving  Support/Resources  Prognosis:  Unable to determine, based on outcomes.  12 months or less would be anticipated based on chronic illness burden, morbid obesity, decreasing functional status, lack of home support  Discharge Planning: To be determined, shared with Frederick Brown that he must be able to care for himself before discharging home.  We talked about the benefits of short-term rehab.  He is agreeable to outpatient palliative services with [facility].      Primary Diagnoses: Present on Admission:  Acute on chronic diastolic (congestive) heart failure (HCC)  Alcohol abuse  Atrial fibrillation (HCC)  Essential hypertension  Gout  Morbid obesity (HCC)   I have reviewed the medical record, interviewed the patient and family,  and examined the patient. The following aspects are pertinent.  Past Medical History:  Diagnosis Date   A-fib [facility])    Alcohol abuse    CHF (congestive heart failure) (HCC)    Chronic respiratory failure with hypoxia (HCC) 05/28/2011   Cor pulmonale, chronic (HCC) February 2013   Degenerative joint disease    Fracture of great toe, left, closed 05/28/2011   Fracture of metatarsal of left foot, closed 05/28/2011   Gastroesophageal reflux disease    Hypertension    Obesity hypoventilation syndrome (HCC) February 2013   Obesity, morbid (more than 100 lbs over ideal weight or BMI > 40) (HCC)    Pickwickian syndrome (HCC)    Umbilical hernia    Urticarial rash 05/31/2011   From Cipro    Vitamin B12 deficiency 05/22/2011   Social History  Socioeconomic History   Marital status: Divorced    Spouse name: Not on file   Number of children: Not on file   Years of education: Not on file   Highest education level: Not on file  Occupational History   Not on file  Tobacco Use   Smoking status: Former    Current packs/day: 0.00    Types: Cigarettes    Quit date: 04/02/1981    Years since quitting: 42.5   Smokeless tobacco: Never  Vaping Use   Vaping status: Never Used  Substance and Sexual Activity   Alcohol use: Yes    Alcohol/week: 1.0 standard drink of alcohol    Types: 1 Shots of liquor per week    Comment: 2-3 drinks daily   Drug use: No   Sexual activity: Not Currently  Other Topics Concern   Not on file  Social History Narrative   Not on file   Social Drivers of Health   Financial Resource Strain: Not on file  Food Insecurity: Food Insecurity Present (09/08/2023)   Hunger Vital Sign    Worried About Running Out of Food in the Last Year: Sometimes true    Ran Out of Food in the Last Year: Sometimes true  Transportation Needs: No Transportation Needs (09/08/2023)   PRAPARE - Administrator, Civil Service (Medical): No    Lack of Transportation (Non-Medical): No   Physical Activity: Not on file  Stress: Not on file  Social Connections: Socially Isolated (09/08/2023)   Social Connection and Isolation Panel    Frequency of Communication with Friends and Family: More than three times a week    Frequency of Social Gatherings with Friends and Family: Three times a week    Attends Religious Services: Never    Active Member of Clubs or Organizations: No    Attends Engineer, structural: Never    Marital Status: Divorced   Family History  Problem Relation Age of Onset   Heart attack Father    Heart failure Father    Stroke Father    Hypertension Father    Scheduled Meds:  allopurinol   300 mg Oral Daily   docusate sodium   100 mg Oral BID   ferrous sulfate   325 mg Oral Q breakfast   folic acid   1 mg Oral Daily   leptospermum manuka honey  1 Application Topical Daily   melatonin  3 mg Oral QHS   metoprolol  tartrate  12.5 mg Oral BID   multivitamin with minerals  1 tablet Oral Daily   nystatin    Topical BID   pantoprazole   40 mg Oral Daily   potassium chloride   40 mEq Oral Daily   potassium chloride   40 mEq Oral Once   rivaroxaban   20 mg Oral Daily   rosuvastatin   20 mg Oral Daily   thiamine   100 mg Oral Daily   Or   thiamine   100 mg Intravenous Daily   Continuous Infusions:  furosemide  (LASIX ) 200 mg in dextrose  5 % 100 mL (2 mg/mL) infusion 18 mg/hr (09/24/23 0724)   PRN Meds:.acetaminophen  **OR** acetaminophen , guaiFENesin -dextromethorphan , ondansetron  **OR** ondansetron  (ZOFRAN ) IV, polyethylene glycol Medications Prior to Admission:  Prior to Admission medications   Medication Sig Start Date End Date Taking? Authorizing Provider  acetaminophen  (TYLENOL ) 500 MG tablet Take 1,000 mg by mouth every 8 (eight) hours as needed for mild pain (pain score 1-3) or headache.   Yes [provider]  allopurinol  (ZYLOPRIM ) 300 MG tablet Take 300 mg  by mouth daily.   Yes [provider]  Cholecalciferol (VITAMIN D -3 PO) Take 1  tablet by mouth daily.   Yes [provider]  furosemide  (LASIX ) 40 MG tablet Take 40 mg by mouth 2 (two) times daily. 10/10/13  Yes [provider]  Melatonin 10 MG TABS Take 1 tablet by mouth at bedtime.   Yes [provider]  metoprolol  tartrate (LOPRESSOR ) 25 MG tablet Take 2 tablets (50 mg total) by mouth 2 (two) times daily. 02/25/15  Yes Ladora Coy, MD  omeprazole (PRILOSEC) 40 MG capsule Take 40 mg by mouth 3 (three) times a week. 06/14/23  Yes [provider]  rivaroxaban  (XARELTO ) 20 MG TABS tablet TAKE 1 TABLET(20 MG) BY MOUTH DAILY WITH SUPPER 01/22/23  Yes Delford Coy BROCKS, MD  rosuvastatin  (CRESTOR ) 20 MG tablet Take 1 tablet (20 mg total) by mouth daily. 11/05/22  Yes Delford Coy BROCKS, MD   No Known Allergies Review of Systems  Unable to perform ROS: Other    Physical Exam Vitals and nursing note reviewed.  Constitutional:      General: He is not in acute distress.    Appearance: He is obese. He is ill-appearing.   Cardiovascular:     Rate and Rhythm: Normal rate.  Pulmonary:     Effort: Pulmonary effort is normal. No respiratory distress.   Skin:    General: Skin is warm and dry.   Neurological:     Mental Status: He is alert and oriented to person, place, and time.   Psychiatric:        Mood and Affect: Mood normal.        Behavior: Behavior normal.     Vital Signs: BP 110/79 (BP Location: Left Arm)   Pulse 78   Temp 97.8 F (36.6 C) (Oral)   Resp 18   Ht 5' 10 (1.778 m)   Wt (!) 166 kg   SpO2 98%   BMI 52.51 kg/m  Pain Scale: 0-10 POSS *See Group Information*: 1-Acceptable,Awake and alert Pain Score: 0-No pain   SpO2: SpO2: 98 % O2 Device:SpO2: 98 % O2 Flow Rate: .O2 Flow Rate (L/min): 4 L/min  IO: Intake/output summary:  Intake/Output Summary (Last 24 hours) at 09/24/2023 9070 Last data filed at 09/24/2023 0900 Gross per 24 hour  Intake 1797.05 ml  Output 9150 ml  Net -7352.95 ml    LBM: Last BM Date :  09/20/23 Baseline Weight: Weight: (!) 185 kg Most recent weight: Weight: (!) 166 kg     Palliative Assessment/Data:     Time In: 1000    Time Out: 1115 Time Total: 75 minutes  Greater than 50%  of this time was spent counseling and coordinating care related to the above assessment and plan.  Signed by: Lorenza DELENA Birkenhead, NP   Please contact Palliative Medicine Team phone at [phone] for questions and concerns.  For individual provider: See Tracey

## 2023-09-24 NOTE — Progress Notes (Signed)
 Not wearing CPAP

## 2023-09-24 NOTE — Plan of Care (Signed)

## 2023-09-24 NOTE — TOC Progression Note (Signed)
 Transition of Care [facility]) - Progression Note    Patient Details  Name: Frederick Brown MRN: [id number] Date of Birth: Jan 10, 1951  [name] CM/SW Contact  Noreen KATHEE [place], [place] Phone Number: 09/24/2023, 12:55 PM  Clinical Narrative:     CSW spoke with patient at bedside to discuss his disposition for Friday. Patient stated that he was going [facility] with [facility] with [facility] and will arrange his own ride. TOC to follow.   Expected Discharge Plan: Home w Home Health Services Barriers to Discharge: Continued Medical Work up  Expected Discharge Plan and Services       Living arrangements for the past 2 months: Single Family Home                           HH Arranged: PT HH Agency: [facility]         Social Determinants of Health (SDOH) Interventions SDOH Screenings   Food Insecurity: Food Insecurity Present (09/08/2023)  Housing: Low Risk  (09/08/2023)  Transportation Needs: No Transportation Needs (09/08/2023)  Utilities: Not At Risk (09/08/2023)  Social Connections: Socially Isolated (09/08/2023)  Tobacco Use: Medium Risk (09/24/2023)    Readmission Risk Interventions    09/24/2023   12:54 PM 09/23/2023    9:39 AM  Readmission Risk Prevention Plan  Transportation Screening Complete Complete  Home Care Screening Complete Complete  Medication Review (RN CM) Complete Complete

## 2023-09-24 NOTE — Progress Notes (Signed)
 PROGRESS NOTE  Frederick Brown FMW:[id number] DOB: 12-10-1950 DOA: 09/08/2023 PCP: [name], MD  Brief History:  73 y.o. male with medical history significant for diastolic CHF, alcohol abuse, atrial fibrillation, hypertension, gout, morbid obesity.  Presented with generalized weakness, shortness of breath and worsening leg swelling.  He had not been compliant with his furosemide  at home.  Drinks about 2 glasses of vodka on a daily basis.  He was hospitalized for further management of volume overload.  There's been no signs of alcohol withdrawal.  Hospitalization has been prolonged due to massive fluid overload requiring IV lasix  continuous infustion.  Cardiology was consulted    Assessment/Plan:  Acute on chronic diastolic CHF/acute respiratory failure with hypoxia and hypercarbia Pt admitted to noncomliance with diuretic Last echo from 2018 showed LVEF of 55%. - 09/10/23 Echo EF 55-60%, no WMA, IV septum flattening Strict ins and outs and daily weights. -Pt has not been compliant with fluid restriction  -Negative 32L -CVP not being measured anymore because patient pulled out PICC line -Remains vol overloaded  -Cardiology consulted 6/13 with recommendations to transition to Lasix  drip which he remains on at this time - Currently on 4 L nasal cannula oxygen  and typically on room air at home - Continues to have aggressive diuresis and Lasix  drip increased to 18 mg/h by cardiology on 6/23 - PICC line pulled out by patient, IV lasix  infusion and Diamox /metolazone  dosing per cardiology - CVP was monitored by cardiology team now via PICC line however PICC was pulled out by patient    Alcohol abuse Counseled.  Pt claims that he has only 2 drinks every evening while cooking his meal.  Continue CIWA protocol thiamine  folate multivitamins.   -No signs of withdrawal.   Persistent atrial fibrillation -continue Xarelto  -continue metoprolol  tartrate   Chronic Constipation - s/p  molasses enema with large BM - start daily miralax  and senna    Normocytic anemia Hemoglobin of 8.1 today.  No bleeding episodes.  Patient denies any black stools.  Stool was Hemoccult negative.  Patient reported that he had a colonoscopy within the last 2 to 3 years and which was unremarkable. -On chart review, noted colonoscopy in 2009 with 3 tubular adenomas  Iron  level is low at 16 with sat of 4%. S/p dose of IV iron . On iron  supplementation -Hgb remains stable   Chronic kidney disease stage IIIa -baseline creatinine 1.1-1.4 -Renal function is stable with diuresis.     Essential hypertension -well controlled   History of gout Continue allopurinol .   Morbid obesity Estimated body mass index is 58.01 kg/m as calculated from the following:   Height as of this encounter: 5' 10 (1.778 m).   Weight as of this encounter: 183.4 kg.   Obesity Hypoventilation Syndrome Ordered CPAP use while sleeping however patient refuses to use it       Family Communication:  no Family at bedside  Consultants:  cardiology  Code Status:  FULL   DVT Prophylaxis: Xarelto   Procedures: As Listed in Progress Note Above  Antibiotics: None    Subjective: Pt is breathing better.  Denies cp, n/v/d, abd pain  Objective: Vitals:   09/23/23 1351 09/23/23 2022 09/24/23 0510 09/24/23 1351  BP: 96/69 106/78 110/79 (!) 110/40  Pulse: 87 75 78 77  Resp: 19 20 18 17   Temp: 98 F (36.7 C) 97.8 F (36.6 C) 97.8 F (36.6 C) 97.9 F (36.6 C)  TempSrc:  Oral Oral  Oral  SpO2: 95% 98%  98%  Weight:   (!) 166 kg   Height:        Intake/Output Summary (Last 24 hours) at 09/24/2023 1746 Last data filed at 09/24/2023 1738 Gross per 24 hour  Intake 1737.05 ml  Output 89949 ml  Net -8312.95 ml   Weight change: 24 kg Exam:  General:  Pt is alert, follows commands appropriately, not in acute distress HEENT: No icterus, No thrush, No neck mass, [place]/AT Cardiovascular: RRR, S1/S2, no rubs, no  gallops Respiratory: bibasilar crackles Abdomen: Soft/+BS, non tender, non distended, no guarding Extremities: 1 + LE edema, No lymphangitis, No petechiae, No rashes, no synovitis   Data Reviewed: I have personally reviewed following labs and imaging studies Basic Metabolic Panel: Recent Labs  Lab 09/19/23 0419 09/20/23 0405 09/21/23 0442 09/22/23 0304 09/23/23 0432 09/24/23 0440  NA 141 144 143 142 144 141  K 3.7 3.9 4.1 3.9 4.1 3.3*  CL 97* 101 101 98 97* 91*  CO2 35* 38* 36* 35* 37* 40*  GLUCOSE 154* 111* 111* 99 109* 104*  BUN 26* 27* 28* 29* 26* 28*  CREATININE 1.10 1.11 1.10 1.12 1.12 1.15  CALCIUM  9.5 9.8 9.7 9.9 9.7 9.7  MG 2.7*  --   --  3.0*  --  2.8*   Liver Function Tests: No results for input(s): AST, ALT, ALKPHOS, BILITOT, PROT, ALBUMIN in the last 168 hours. No results for input(s): LIPASE, AMYLASE in the last 168 hours. Recent Labs  Lab 09/23/23 1243  AMMONIA 29   Coagulation Profile: No results for input(s): INR, PROTIME in the last 168 hours. CBC: Recent Labs  Lab 09/20/23 0405 09/21/23 0442 09/22/23 0304 09/23/23 0432 09/24/23 0440  WBC 6.4 5.9 7.2 6.3 6.9  HGB 7.5* 7.3* 8.3* 8.3* 7.8*  HCT 31.0* 30.2* 33.1* 33.9* 30.7*  MCV 88.3 88.0 88.7 90.6 88.0  PLT 219 225 252 266 275   Cardiac Enzymes: No results for input(s): CKTOTAL, CKMB, CKMBINDEX, TROPONINI in the last 168 hours. BNP: Invalid input(s): POCBNP CBG: No results for input(s): GLUCAP in the last 168 hours. HbA1C: No results for input(s): HGBA1C in the last 72 hours. Urine analysis:    Component Value Date/Time   COLORURINE YELLOW 01/17/2015 1637   APPEARANCEUR HAZY (A) 01/17/2015 1637   LABSPEC 1.015 01/17/2015 1637   PHURINE 7.0 01/17/2015 1637   GLUCOSEU NEGATIVE 01/17/2015 1637   HGBUR SMALL (A) 01/17/2015 1637   BILIRUBINUR SMALL (A) 01/17/2015 1637   KETONESUR NEGATIVE 01/17/2015 1637   PROTEINUR 30 (A) 01/17/2015 1637    UROBILINOGEN 1.0 01/17/2015 1637   NITRITE NEGATIVE 01/17/2015 1637   LEUKOCYTESUR LARGE (A) 01/17/2015 1637   Sepsis Labs: @LABRCNTIP (procalcitonin:4,lacticidven:4) )No results found for this or any previous visit (from the past 240 hours).   Scheduled Meds:  allopurinol   300 mg Oral Daily   docusate sodium   100 mg Oral BID   ferrous sulfate   325 mg Oral Q breakfast   folic acid   1 mg Oral Daily   leptospermum manuka honey  1 Application Topical Daily   melatonin  3 mg Oral QHS   metoprolol  tartrate  12.5 mg Oral BID   multivitamin with minerals  1 tablet Oral Daily   nystatin    Topical BID   pantoprazole   40 mg Oral Daily   polyethylene glycol  17 g Oral Daily   potassium chloride   40 mEq Oral Daily   potassium chloride   40 mEq Oral Once   rivaroxaban   20 mg Oral Daily   rosuvastatin   20 mg Oral Daily   senna  2 tablet Oral Daily   thiamine   100 mg Oral Daily   Or   thiamine   100 mg Intravenous Daily   Continuous Infusions:  furosemide  (LASIX ) 200 mg in dextrose  5 % 100 mL (2 mg/mL) infusion 18 mg/hr (09/24/23 0724)    Procedures/Studies: DG CHEST PORT 1 VIEW Result Date: 09/16/2023 CLINICAL DATA:  PICC placement EXAM: PORTABLE CHEST 1 VIEW COMPARISON:  09/08/2023 FINDINGS: Single frontal view of the chest demonstrates right-sided PICC tip overlying the atriocaval junction. Cardiac silhouette remains enlarged. Persistent vascular congestion. Costophrenic angles are excluded by collimation. No pneumothorax. IMPRESSION: 1. Right-sided PICC tip overlying atriocaval junction. 2. Stable enlarged cardiac silhouette and pulmonary vascular congestion. Electronically Signed   By: Ozell Daring M.D.   On: 09/16/2023 20:09   US  EKG SITE RITE Result Date: 09/16/2023 If Site Rite image not attached, placement could not be confirmed due to current cardiac rhythm.  ECHOCARDIOGRAM COMPLETE Result Date: 09/10/2023    ECHOCARDIOGRAM REPORT   Patient Name:   Frederick Brown Date of Exam:  09/10/2023 Medical Rec #:  [id number]       Height:       70.0 in Accession #:    [id number]      Weight:       405.4 lb Date of Birth:  26-Feb-1951       BSA:          2.818 m Patient Age:    73 years        BP:           129/58 mmHg Patient Gender: M               HR:           67 bpm. Exam Location:  [facility] Procedure: 2D Echo, Cardiac Doppler and Color Doppler (Both Spectral and Color            Flow Doppler were utilized during procedure). Indications:    Congestive Heart Failure I50.9  History:        Patient has prior history of Echocardiogram examinations, most                 recent 01/07/2017. CHF; Risk Factors:Hypertension.  Sonographer:    Koleen Popper RDCS Referring Phys: [id number] EJIROGHENE E EMOKPAE IMPRESSIONS  1. Left ventricular ejection fraction, by estimation, is 55 to 60%. The left ventricle has normal function. The left ventricle has no regional wall motion abnormalities. The left ventricular internal cavity size was mildly dilated. There is mild concentric left ventricular hypertrophy. Left ventricular diastolic parameters are indeterminate. There is the interventricular septum is flattened in systole and diastole, consistent with right ventricular pressure and volume overload.  2. Right ventricular systolic function is moderately reduced. The right ventricular size is mildly enlarged with prominent apical trabeculation. There is moderately elevated pulmonary artery systolic pressure. The estimated right ventricular systolic pressure is 58.0 mmHg.  3. Left atrial size was mildly dilated.  4. Right atrial size was mildly dilated.  5. The mitral valve is degenerative. Mild mitral valve regurgitation.  6. Tricuspid valve regurgitation is moderate to severe.  7. The aortic valve was not well visualized. There is moderate calcification of the aortic valve. Aortic valve regurgitation is not visualized. Aortic valve sclerosis/calcification is present, without any evidence of aortic stenosis.  8. The  inferior vena cava is dilated in size with <50%  respiratory variability, suggesting right atrial pressure of 15 mmHg. Comparison(s): Prior images unable to be directly viewed. FINDINGS  Left Ventricle: Left ventricular ejection fraction, by estimation, is 55 to 60%. The left ventricle has normal function. The left ventricle has no regional wall motion abnormalities. The left ventricular internal cavity size was mildly dilated. There is  mild concentric left ventricular hypertrophy. The interventricular septum is flattened in systole and diastole, consistent with right ventricular pressure and volume overload. Left ventricular diastolic function could not be evaluated due to atrial fibrillation. Left ventricular diastolic parameters are indeterminate. Right Ventricle: The right ventricular size is mildly enlarged. No increase in right ventricular wall thickness. Right ventricular systolic function is moderately reduced. There is moderately elevated pulmonary artery systolic pressure. The tricuspid regurgitant velocity is 3.28 m/s, and with an assumed right atrial pressure of 15 mmHg, the estimated right ventricular systolic pressure is 58.0 mmHg. Left Atrium: Left atrial size was mildly dilated. Right Atrium: Right atrial size was mildly dilated. Pericardium: There is no evidence of pericardial effusion. Presence of epicardial fat layer. Mitral Valve: The mitral valve is degenerative in appearance. Mild mitral annular calcification. Mild mitral valve regurgitation, with posteriorly-directed jet. Tricuspid Valve: The tricuspid valve is grossly normal. Tricuspid valve regurgitation is moderate to severe. Aortic Valve: The aortic valve was not well visualized. There is moderate calcification of the aortic valve. There is moderate aortic valve annular calcification. Aortic valve regurgitation is not visualized. Aortic valve sclerosis/calcification is present, without any evidence of aortic stenosis. Pulmonic Valve: The  pulmonic valve was grossly normal. Pulmonic valve regurgitation is trivial. Aorta: The aortic root is normal in size and structure. Venous: The inferior vena cava is dilated in size with less than 50% respiratory variability, suggesting right atrial pressure of 15 mmHg. IAS/Shunts: No atrial level shunt detected by color flow Doppler. Additional Comments: 3D was performed not requiring image post processing on an independent workstation and was indeterminate.  LEFT VENTRICLE PLAX 2D LVIDd:         6.30 cm   Diastology LVIDs:         4.30 cm   LV e' medial:  12.00 cm/s LV PW:         1.10 cm   LV e' lateral: 14.10 cm/s LV IVS:        1.00 cm LVOT diam:     2.10 cm LV SV:         59 LV SV Index:   21 LVOT Area:     3.46 cm  RIGHT VENTRICLE RV Basal diam:  5.85 cm RV S prime:     14.00 cm/s TAPSE (M-mode): 1.5 cm LEFT ATRIUM            Index        RIGHT ATRIUM           Index LA diam:      6.80 cm  2.41 cm/m   RA Area:     30.10 cm LA Vol (A4C): 102.0 ml 36.19 ml/m  RA Volume:   90.10 ml  31.97 ml/m  AORTIC VALVE LVOT Vmax:   89.70 cm/s LVOT Vmean:  56.900 cm/s LVOT VTI:    0.171 m  AORTA Ao Root diam: 3.70 cm TRICUSPID VALVE TR Peak grad:   43.0 mmHg TR Vmax:        328.00 cm/s  SHUNTS Systemic VTI:  0.17 m Systemic Diam: 2.10 cm Jayson Sierras MD Electronically signed by Jayson Sierras MD Signature Date/Time: 09/10/2023/2:52:12  PM    Final    DG Chest Port 1 View Result Date: 09/08/2023 CLINICAL DATA:  sob EXAM: PORTABLE CHEST - 1 VIEW COMPARISON:  None available. FINDINGS: Diffuse bilateral perihilar interstitial opacities. Blunting of the right costophrenic angle with hazy airspace attenuation, likely reflecting a small effusion and atelectasis. Trace left pleural effusion. Cardiomegaly. Tortuous aorta with aortic atherosclerosis. No acute fracture or destructive lesions. Multilevel thoracic osteophytosis. IMPRESSION: Cardiomegaly with findings of interstitial edema. Small right and trace left pleural  effusions. Electronically Signed   By: Rogelia Myers M.D.   On: 09/08/2023 18:02    Alm Schneider, DO  [facility]  If 7PM-7AM, please contact night-coverage [email or web address] Password [email or web address] 09/24/2023, 5:46 PM   LOS: 16 days

## 2023-09-24 NOTE — Progress Notes (Signed)
 Rounding Note   Patient Name: Frederick Brown Date of Encounter: 09/24/2023  [facility] Cardiologist: Maude Emmer, MD   Subjective Ongoing SOB  Scheduled Meds:  allopurinol   300 mg Oral Daily   docusate sodium   100 mg Oral BID   ferrous sulfate   325 mg Oral Q breakfast   folic acid   1 mg Oral Daily   leptospermum manuka honey  1 Application Topical Daily   melatonin  3 mg Oral QHS   metoprolol  tartrate  12.5 mg Oral BID   multivitamin with minerals  1 tablet Oral Daily   nystatin    Topical BID   pantoprazole   40 mg Oral Daily   potassium chloride   40 mEq Oral Daily   rivaroxaban   20 mg Oral Daily   rosuvastatin   20 mg Oral Daily   thiamine   100 mg Oral Daily   Or   thiamine   100 mg Intravenous Daily   Continuous Infusions:  furosemide  (LASIX ) 200 mg in dextrose  5 % 100 mL (2 mg/mL) infusion 18 mg/hr (09/24/23 0724)   PRN Meds: acetaminophen  **OR** acetaminophen , guaiFENesin -dextromethorphan , ondansetron  **OR** ondansetron  (ZOFRAN ) IV, polyethylene glycol   Vital Signs  Vitals:   09/23/23 0427 09/23/23 1351 09/23/23 2022 09/24/23 0510  BP:  96/69 106/78 110/79  Pulse:  87 75 78  Resp:  19 20 18   Temp:  98 F (36.7 C) 97.8 F (36.6 C) 97.8 F (36.6 C)  TempSrc:   Oral Oral  SpO2:  95% 98%   Weight: (!) 142 kg   (!) 166 kg  Height:        Intake/Output Summary (Last 24 hours) at 09/24/2023 0851 Last data filed at 09/24/2023 0841 Gross per 24 hour  Intake 1997.05 ml  Output 8150 ml  Net -6152.95 ml      09/24/2023    5:10 AM 09/23/2023    4:27 AM 09/22/2023    5:06 AM  Last 3 Weights  Weight (lbs) 365 lb 15.4 oz 313 lb 0.9 oz 379 lb 6.6 oz  Weight (kg) 166 kg 142 kg 172.1 kg      Telemetry Rate controlled afib - Personally Reviewed  ECG  N/a - Personally Reviewed  Physical Exam  GEN: No acute distress.   Neck: + JVD Cardiac: irreg  Respiratory: crackles bilaterally GI: Soft, nontender, non-distended  MS: 1+ bilateral LE  edema Neuro:  Nonfocal  Psych: Normal affect   Labs High Sensitivity Troponin:   Recent Labs  Lab 09/08/23 1719 09/08/23 2128  TROPONINIHS 9 9     Chemistry Recent Labs  Lab 09/19/23 0419 09/20/23 0405 09/22/23 0304 09/23/23 0432 09/24/23 0440  NA 141   < > 142 144 141  K 3.7   < > 3.9 4.1 3.3*  CL 97*   < > 98 97* 91*  CO2 35*   < > 35* 37* 40*  GLUCOSE 154*   < > 99 109* 104*  BUN 26*   < > 29* 26* 28*  CREATININE 1.10   < > 1.12 1.12 1.15  CALCIUM  9.5   < > 9.9 9.7 9.7  MG 2.7*  --  3.0*  --  2.8*  GFRNONAA >60   < > >60 >60 >60  ANIONGAP 9   < > 9 10 10    < > = values in this interval not displayed.    Lipids No results for input(s): CHOL, TRIG, HDL, LABVLDL, LDLCALC, CHOLHDL in the last 168 hours.  Hematology Recent Labs  Lab  09/22/23 0304 09/23/23 0432 09/24/23 0440  WBC 7.2 6.3 6.9  RBC 3.73* 3.74* 3.49*  HGB 8.3* 8.3* 7.8*  HCT 33.1* 33.9* 30.7*  MCV 88.7 90.6 88.0  MCH 22.3* 22.2* 22.3*  MCHC 25.1* 24.5* 25.4*  RDW 23.9* 23.9* 23.9*  PLT 252 266 275   Thyroid  No results for input(s): TSH, FREET4 in the last 168 hours.  BNPNo results for input(s): BNP, PROBNP in the last 168 hours.  DDimer No results for input(s): DDIMER in the last 168 hours.   Radiology  No results found.    Patient Profile   PHILANDER AKE is a 73 y.o. male with a hx of permanent atrial fibrillation, chronic HFpEF, HTN, HLD, alcohol use and OSA who is being seen 09/12/2023 for the evaluation of acute HFpEF at the request of Dr. Cindy.   Assessment & Plan  1.Acute on chronic HFpEF/RV failure - 08/2023 echo: LVEF 55-60%, no WMAs, indet diastolic function, D shaped septum, mod RV dysfunction, mod pulm HTN, mild LAE, mild MR, mod to severe TR  -BNP 875, CXR +interstitial edema, small right and trace left pleural effusion - exacerbation in diuretic noncompliance at home   -on lasix  gtt at 18mg /hr, got oral metolazone  2.5mg  yesterday. Negative 5.7 L  yesterdy, 28L since admission. Renal function is stable. Not able to stand for weights, bed weights 407-->365 lbs yesterday, bed weights not able to stand for weights.  - remains fluid overloaded. Dose acetazolamide  due to alkalosis, metolazone  2.5mg  today.  - consider SGLT2i closer to discharge.     - suspect pulmonary HTN and RV dysfunction related to LV diastolic dysfunction, OSA not treated. Very likely given BMI may have a component of OHS.  - Would plan on outpatient PFTs, sleep study, VQ scan.    2.Chronic afib - rate controlled, on xarelto  for stroke prevention       3. Anemia - admit Hgb 7.6, 1 year ago Hgb 11 - ferritin 5, FOBT neg - given IV iron      For questions or updates, please contact [facility] Please consult [email or web address] for contact info under     Signed, Alvan Carrier, MD  09/24/2023, 8:51 AM

## 2023-09-25 DIAGNOSIS — Z515 Encounter for palliative care: Secondary | ICD-10-CM | POA: Diagnosis not present

## 2023-09-25 DIAGNOSIS — Z7189 Other specified counseling: Secondary | ICD-10-CM | POA: Diagnosis not present

## 2023-09-25 DIAGNOSIS — I4819 Other persistent atrial fibrillation: Secondary | ICD-10-CM | POA: Diagnosis not present

## 2023-09-25 DIAGNOSIS — I5033 Acute on chronic diastolic (congestive) heart failure: Secondary | ICD-10-CM | POA: Diagnosis not present

## 2023-09-25 LAB — BASIC METABOLIC PANEL WITH GFR
Anion gap: 12 (ref 5–15)
BUN: 30 mg/dL — ABNORMAL HIGH (ref 8–23)
CO2: 42 mmol/L — ABNORMAL HIGH (ref 22–32)
Calcium: 10.4 mg/dL — ABNORMAL HIGH (ref 8.9–10.3)
Chloride: 86 mmol/L — ABNORMAL LOW (ref 98–111)
Creatinine, Ser: 1.03 mg/dL (ref 0.61–1.24)
GFR, Estimated: >60 mL/min (ref >60)
Glucose, Bld: 96 mg/dL (ref 70–99)
Potassium: 3.3 mmol/L — ABNORMAL LOW (ref 3.5–5.1)
Sodium: 140 mmol/L (ref 135–145)

## 2023-09-25 LAB — MAGNESIUM: Magnesium: 2.6 mg/dL — ABNORMAL HIGH (ref 1.7–2.4)

## 2023-09-25 MED ORDER — POTASSIUM CHLORIDE CRYS ER 20 MEQ PO TBCR: 40.0000 meq | EXTENDED_RELEASE_TABLET | Freq: Once | ORAL | Status: DC

## 2023-09-25 MED ORDER — POTASSIUM CHLORIDE CRYS ER 20 MEQ PO TBCR
40.0000 meq | EXTENDED_RELEASE_TABLET | Freq: Four times a day (QID) | ORAL | Status: AC
  Administered 2023-09-25 (×2): 40 meq via ORAL
  Filled 2023-09-25 (×2): qty 2

## 2023-09-25 MED ORDER — ACETAZOLAMIDE SODIUM 500 MG IJ SOLR
500.0000 mg | Freq: Once | INTRAMUSCULAR | Status: AC
  Administered 2023-09-25: 500 mg via INTRAVENOUS
  Filled 2023-09-25: qty 500

## 2023-09-25 NOTE — Progress Notes (Signed)
 Palliative: [name] is lying quietly in bed.  He appears chronically ill and frail, obese.  He greets me, making and mostly keeping eye contact.  He is alert and oriented, able to make his basic needs known.  There is no family at bedside at this time.  We talked about his acute illness and the treatment plan.  [name] states that he understands he will need to stay in the hospital a few more days for diuresis.  [name] now states that he wants to try short-term rehab after speaking with his neighbor/HCPOA, Hosey.  We briefly talked about how this process works and I shared that will pass his request onto transition of care team. I shared that if he is offered a bed that he does not want OR not offered a bed, he would return home.  We again today talk about CODE STATUS.  [name] states that this is a lot to consider.  I encouraged him to think about, pray about, what he does and does not want and share these choices with his legal HCPOA, neighbor Hosey.  Conference with attending, bedside nursing staff, transition of care team related to patient condition, needs, goals of care, disposition.  Plan:  Continue full scope/full code, considering CODE STATUS.  Open to short-term rehab if choice of bed offer is met, otherwise home with home health.  Outpatient palliative with [facility]  35 minutes Roi Jafari, NP Palliative medicine team Team phone [phone]

## 2023-09-25 NOTE — Progress Notes (Signed)
 Rounding Note   Patient Name: Frederick Brown Date of Encounter: 09/25/2023  [facility] Cardiologist: Maude Emmer, MD   Subjective Some ongoing SOB.   Scheduled Meds:  acetaZOLAMIDE   500 mg Intravenous Once   allopurinol   300 mg Oral Daily   docusate sodium   100 mg Oral BID   ferrous sulfate   325 mg Oral Q breakfast   folic acid   1 mg Oral Daily   leptospermum manuka honey  1 Application Topical Daily   melatonin  3 mg Oral QHS   metoprolol  tartrate  12.5 mg Oral BID   multivitamin with minerals  1 tablet Oral Daily   nystatin    Topical BID   pantoprazole   40 mg Oral Daily   polyethylene glycol  17 g Oral Daily   potassium chloride   40 mEq Oral Daily   potassium chloride   40 mEq Oral Q6H   rivaroxaban   20 mg Oral Daily   rosuvastatin   20 mg Oral Daily   senna  2 tablet Oral Daily   thiamine   100 mg Oral Daily   Or   thiamine   100 mg Intravenous Daily   Continuous Infusions:  furosemide  (LASIX ) 200 mg in dextrose  5 % 100 mL (2 mg/mL) infusion 18 mg/hr (09/25/23 0704)   PRN Meds: acetaminophen  **OR** acetaminophen , guaiFENesin -dextromethorphan , ondansetron  **OR** ondansetron  (ZOFRAN ) IV, polyethylene glycol   Vital Signs  Vitals:   09/24/23 0510 09/24/23 1351 09/24/23 1957 09/25/23 0555  BP: 110/79 (!) 110/40 (!) 112/54 (!) 113/52  Pulse: 78 77 68 87  Resp: 18 17 16 16   Temp: 97.8 F (36.6 C) 97.9 F (36.6 C) 98 F (36.7 C) 98.4 F (36.9 C)  TempSrc: Oral Oral Oral Oral  SpO2:  98%  95%  Weight: (!) 166 kg     Height:        Intake/Output Summary (Last 24 hours) at 09/25/2023 0845 Last data filed at 09/25/2023 0816 Gross per 24 hour  Intake 1059.38 ml  Output 89399 ml  Net -9540.62 ml      09/24/2023    5:10 AM 09/23/2023    4:27 AM 09/22/2023    5:06 AM  Last 3 Weights  Weight (lbs) 365 lb 15.4 oz 313 lb 0.9 oz 379 lb 6.6 oz  Weight (kg) 166 kg 142 kg 172.1 kg      Telemetry Rate controlled afib - Personally Reviewed  ECG  N/a -  Personally Reviewed  Physical Exam GEN: No acute distress.   Neck: No JVD Cardiac: irreg Respiratory: faint crackles bilaterlaly GI: Soft, nontender, non-distended  MS: 1+ bilateral edema Neuro:  Nonfocal  Psych: Normal affect   Labs High Sensitivity Troponin:   Recent Labs  Lab 09/08/23 1719 09/08/23 2128  TROPONINIHS 9 9     Chemistry Recent Labs  Lab 09/22/23 0304 09/23/23 0432 09/24/23 0440 09/25/23 0420  NA 142 144 141 140  K 3.9 4.1 3.3* 3.3*  CL 98 97* 91* 86*  CO2 35* 37* 40* 42*  GLUCOSE 99 109* 104* 96  BUN 29* 26* 28* 30*  CREATININE 1.12 1.12 1.15 1.03  CALCIUM  9.9 9.7 9.7 10.4*  MG 3.0*  --  2.8* 2.6*  GFRNONAA >60 >60 >60 >60  ANIONGAP 9 10 10 12     Lipids No results for input(s): CHOL, TRIG, HDL, LABVLDL, LDLCALC, CHOLHDL in the last 168 hours.  Hematology Recent Labs  Lab 09/22/23 0304 09/23/23 0432 09/24/23 0440  WBC 7.2 6.3 6.9  RBC 3.73* 3.74* 3.49*  HGB  8.3* 8.3* 7.8*  HCT 33.1* 33.9* 30.7*  MCV 88.7 90.6 88.0  MCH 22.3* 22.2* 22.3*  MCHC 25.1* 24.5* 25.4*  RDW 23.9* 23.9* 23.9*  PLT 252 266 275   Thyroid  No results for input(s): TSH, FREET4 in the last 168 hours.  BNPNo results for input(s): BNP, PROBNP in the last 168 hours.  DDimer No results for input(s): DDIMER in the last 168 hours.   Radiology  No results found.  Cardiac Studies   Patient Profile   Frederick Brown is a 73 y.o. male with a hx of permanent atrial fibrillation, chronic HFpEF, HTN, HLD, alcohol use and OSA who is being seen 09/12/2023 for the evaluation of acute HFpEF at the request of Dr. Cindy.   Assessment & Plan   1.Acute on chronic HFpEF/RV failure - 08/2023 echo: LVEF 55-60%, no WMAs, indet diastolic function, D shaped septum, mod RV dysfunction, mod pulm HTN, mild LAE, mild MR, mod to severe TR  -BNP 875, CXR +interstitial edema, small right and trace left pleural effusion - exacerbation in diuretic noncompliance at home   -on  lasix  gtt at 18mg /hr, got oral metolazone  2.5mg  yesterday. Negative 9 L yesterdy though I/Os somewhat incomplete, neg 36L since admission. Renal function is stable. Not able to stand for weights, bed weights 407-->365 lbs yesterday, bed weights not able to stand for weights, weights are pending today - continue lasix  gtt today, acetazolamide  dose. Very strong response to oral metolazone  2.5mg  last 2 days but with progressing alkalosis and decreasing chloride level hold on today.      - suspect pulmonary HTN and RV dysfunction related to LV diastolic dysfunction, OSA not treated. Very likely given BMI may have a component of OHS.  - Would plan on outpatient PFTs, sleep study, VQ scan.    2.Chronic afib - rate controlled, on xarelto  for stroke prevention       3. Anemia - admit Hgb 7.6, 1 year ago Hgb 11 - ferritin 5, FOBT neg - given IV iron         For questions or updates, please contact [facility] Please consult [email or web address] for contact info under     Signed, Alvan Carrier, MD  09/25/2023, 8:45 AM

## 2023-09-25 NOTE — Plan of Care (Signed)
   Problem: Education: Goal: Knowledge of General Education information will improve Description Including pain rating scale, medication(s)/side effects and non-pharmacologic comfort measures Outcome: Progressing

## 2023-09-25 NOTE — Plan of Care (Signed)
   Problem: Activity: Goal: Risk for activity intolerance will decrease Outcome: Progressing   Problem: Coping: Goal: Level of anxiety will decrease Outcome: Progressing   Problem: Safety: Goal: Ability to remain free from injury will improve Outcome: Progressing

## 2023-09-25 NOTE — Progress Notes (Addendum)
 PROGRESS NOTE  [name] FMW:[id number] DOB: 1950-08-16 DOA: 09/08/2023 PCP: Frederick Rush, MD  Brief History:  73 y.o. male with medical history significant for diastolic CHF, alcohol abuse, atrial fibrillation, hypertension, gout, morbid obesity.  Presented with generalized weakness, shortness of breath and worsening leg swelling.  He had not been compliant with his furosemide  at home.  Drinks about 2 glasses of vodka on a daily basis.  He was hospitalized for further management of volume overload.  There's been no signs of alcohol withdrawal.  Hospitalization has been prolonged due to massive fluid overload requiring IV lasix  continuous infustion.  Cardiology was consulted    Assessment/Plan: Acute on chronic diastolic CHF/acute respiratory failure with hypoxia and hypercarbia Pt admitted to noncomliance with diuretic Last echo from 2018 showed LVEF of 55%. - 09/10/23 Echo EF 55-60%, no WMA, IV septum flattening Strict ins and outs and daily weights. -Pt has not been compliant with fluid restriction  -Negative 38L -CVP not being measured anymore because patient pulled out PICC line -Remains vol overloaded  -Cardiology consulted 6/13 with recommendations to transition to Lasix  drip which he remains on at this time - Currently on 4 L nasal cannula oxygen  and typically on room air at home - Continues to have aggressive diuresis and Lasix  drip increased to 18 mg/h by cardiology on 6/23 - PICC line pulled out by patient, IV lasix  infusion and Diamox /metolazone  dosing per cardiology - CVP was monitored by cardiology team now via PICC line however PICC was pulled out by patient  - now has PIV   Alcohol abuse Counseled.  Pt claims that he has only 2 drinks every evening while cooking his meal.  Continue CIWA protocol thiamine  folate multivitamins.   -No signs of withdrawal.   Persistent atrial fibrillation -continue Xarelto  -continue metoprolol  tartrate   Chronic  Constipation - s/p molasses enema with large BM - start daily miralax  and senna    Normocytic anemia Hemoglobin of 8.1 today.  No bleeding episodes.  Patient denies any black stools.  Stool was Hemoccult negative.  Patient reported that he had a colonoscopy within the last 2 to 3 years and which was unremarkable. -On chart review, noted colonoscopy in 2009 with 3 tubular adenomas  Iron  level is low at 16 with sat of 4%. S/p dose of IV iron . On iron  supplementation -Hgb remains stable   Chronic kidney disease stage IIIa -baseline creatinine 1.1-1.4 -Renal function is stable with diuresis.     Essential hypertension -well controlled   History of gout Continue allopurinol .   Morbid obesity Estimated body mass index is 58.01 kg/m as calculated from the following:   Height as of this encounter: 5' 10 (1.778 m).   Weight as of this encounter: 183.4 kg.   Obesity Hypoventilation Syndrome Ordered CPAP use while sleeping however patient refuses to use it   Hypokalemia -repleting         Family Communication:  no Family at bedside   Consultants:  cardiology   Code Status:  FULL    DVT Prophylaxis: Xarelto    Procedures: As Listed in Progress Note Above   Antibiotics: None          Subjective: Pt is breathing better overall with improving edema.  Denies cp, sob, nv/d, abd pain  Objective: Vitals:   09/24/23 1957 09/25/23 0555 09/25/23 0900 09/25/23 1320  BP: (!) 112/54 (!) 113/52  (!) 100/52  Pulse: 68 87  83  Resp:  16 16  18   Temp: 98 F (36.7 C) 98.4 F (36.9 C)  98.2 F (36.8 C)  TempSrc: Oral Oral    SpO2:  95%  91%  Weight:   133 kg   Height:        Intake/Output Summary (Last 24 hours) at 09/25/2023 1756 Last data filed at 09/25/2023 1709 Gross per 24 hour  Intake 931.75 ml  Output 7850 ml  Net -6918.25 ml   Weight change:  Exam:  General:  Pt is alert, follows commands appropriately, not in acute distress HEENT: No icterus, No thrush, No  neck mass, [place]/AT Cardiovascular: IRRR, S1/S2, no rubs, no gallops Respiratory: bibasilar rales. No wheeze Abdomen: Soft/+BS, non tender, non distended, no guarding Extremities: 1 + LE edema, No lymphangitis, No petechiae, No rashes, no synovitis   Data Reviewed: I have personally reviewed following labs and imaging studies Basic Metabolic Panel: Recent Labs  Lab 09/19/23 0419 09/20/23 0405 09/21/23 0442 09/22/23 0304 09/23/23 0432 09/24/23 0440 09/25/23 0420  NA 141   < > 143 142 144 141 140  K 3.7   < > 4.1 3.9 4.1 3.3* 3.3*  CL 97*   < > 101 98 97* 91* 86*  CO2 35*   < > 36* 35* 37* 40* 42*  GLUCOSE 154*   < > 111* 99 109* 104* 96  BUN 26*   < > 28* 29* 26* 28* 30*  CREATININE 1.10   < > 1.10 1.12 1.12 1.15 1.03  CALCIUM  9.5   < > 9.7 9.9 9.7 9.7 10.4*  MG 2.7*  --   --  3.0*  --  2.8* 2.6*   < > = values in this interval not displayed.   Liver Function Tests: No results for input(s): AST, ALT, ALKPHOS, BILITOT, PROT, ALBUMIN in the last 168 hours. No results for input(s): LIPASE, AMYLASE in the last 168 hours. Recent Labs  Lab 09/23/23 1243  AMMONIA 29   Coagulation Profile: No results for input(s): INR, PROTIME in the last 168 hours. CBC: Recent Labs  Lab 09/20/23 0405 09/21/23 0442 09/22/23 0304 09/23/23 0432 09/24/23 0440  WBC 6.4 5.9 7.2 6.3 6.9  HGB 7.5* 7.3* 8.3* 8.3* 7.8*  HCT 31.0* 30.2* 33.1* 33.9* 30.7*  MCV 88.3 88.0 88.7 90.6 88.0  PLT 219 225 252 266 275   Cardiac Enzymes: No results for input(s): CKTOTAL, CKMB, CKMBINDEX, TROPONINI in the last 168 hours. BNP: Invalid input(s): POCBNP CBG: No results for input(s): GLUCAP in the last 168 hours. HbA1C: No results for input(s): HGBA1C in the last 72 hours. Urine analysis:    Component Value Date/Time   COLORURINE YELLOW 01/17/2015 1637   APPEARANCEUR HAZY (A) 01/17/2015 1637   LABSPEC 1.015 01/17/2015 1637   PHURINE 7.0 01/17/2015 1637   GLUCOSEU  NEGATIVE 01/17/2015 1637   HGBUR SMALL (A) 01/17/2015 1637   BILIRUBINUR SMALL (A) 01/17/2015 1637   KETONESUR NEGATIVE 01/17/2015 1637   PROTEINUR 30 (A) 01/17/2015 1637   UROBILINOGEN 1.0 01/17/2015 1637   NITRITE NEGATIVE 01/17/2015 1637   LEUKOCYTESUR LARGE (A) 01/17/2015 1637   Sepsis Labs: @LABRCNTIP (procalcitonin:4,lacticidven:4) )No results found for this or any previous visit (from the past 240 hours).   Scheduled Meds:  allopurinol   300 mg Oral Daily   docusate sodium   100 mg Oral BID   ferrous sulfate   325 mg Oral Q breakfast   folic acid   1 mg Oral Daily   leptospermum manuka honey  1 Application Topical Daily   melatonin  3 mg Oral QHS   metoprolol  tartrate  12.5 mg Oral BID   multivitamin with minerals  1 tablet Oral Daily   nystatin    Topical BID   pantoprazole   40 mg Oral Daily   polyethylene glycol  17 g Oral Daily   potassium chloride   40 mEq Oral Daily   potassium chloride   40 mEq Oral Q6H   rivaroxaban   20 mg Oral Daily   rosuvastatin   20 mg Oral Daily   senna  2 tablet Oral Daily   thiamine   100 mg Oral Daily   Or   thiamine   100 mg Intravenous Daily   Continuous Infusions:  furosemide  (LASIX ) 200 mg in dextrose  5 % 100 mL (2 mg/mL) infusion 18 mg/hr (09/25/23 1709)    Procedures/Studies: DG CHEST PORT 1 VIEW Result Date: 09/16/2023 CLINICAL DATA:  PICC placement EXAM: PORTABLE CHEST 1 VIEW COMPARISON:  09/08/2023 FINDINGS: Single frontal view of the chest demonstrates right-sided PICC tip overlying the atriocaval junction. Cardiac silhouette remains enlarged. Persistent vascular congestion. Costophrenic angles are excluded by collimation. No pneumothorax. IMPRESSION: 1. Right-sided PICC tip overlying atriocaval junction. 2. Stable enlarged cardiac silhouette and pulmonary vascular congestion. Electronically Signed   By: Ozell Daring M.D.   On: 09/16/2023 20:09   US  EKG SITE RITE Result Date: 09/16/2023 If Site Rite image not attached, placement could  not be confirmed due to current cardiac rhythm.  ECHOCARDIOGRAM COMPLETE Result Date: 09/10/2023    ECHOCARDIOGRAM REPORT   Patient Name:   Frederick Brown Date of Exam: 09/10/2023 Medical Rec #:  [id number]       Height:       70.0 in Accession #:    [id number]      Weight:       405.4 lb Date of Birth:  [birth year]       BSA:          2.818 m Patient Age:    73 years        BP:           129/58 mmHg Patient Gender: M               HR:           67 bpm. Exam Location:  [facility] Procedure: 2D Echo, Cardiac Doppler and Color Doppler (Both Spectral and Color            Flow Doppler were utilized during procedure). Indications:    Congestive Heart Failure I50.9  History:        Patient has prior history of Echocardiogram examinations, most                 recent 01/07/2017. CHF; Risk Factors:Hypertension.  Sonographer:    Koleen Popper RDCS Referring Phys: [id number] EJIROGHENE E EMOKPAE IMPRESSIONS  1. Left ventricular ejection fraction, by estimation, is 55 to 60%. The left ventricle has normal function. The left ventricle has no regional wall motion abnormalities. The left ventricular internal cavity size was mildly dilated. There is mild concentric left ventricular hypertrophy. Left ventricular diastolic parameters are indeterminate. There is the interventricular septum is flattened in systole and diastole, consistent with right ventricular pressure and volume overload.  2. Right ventricular systolic function is moderately reduced. The right ventricular size is mildly enlarged with prominent apical trabeculation. There is moderately elevated pulmonary artery systolic pressure. The estimated right ventricular systolic pressure is 58.0 mmHg.  3. Left atrial size was mildly dilated.  4. Right atrial size was  mildly dilated.  5. The mitral valve is degenerative. Mild mitral valve regurgitation.  6. Tricuspid valve regurgitation is moderate to severe.  7. The aortic valve was not well visualized. There is moderate  calcification of the aortic valve. Aortic valve regurgitation is not visualized. Aortic valve sclerosis/calcification is present, without any evidence of aortic stenosis.  8. The inferior vena cava is dilated in size with <50% respiratory variability, suggesting right atrial pressure of 15 mmHg. Comparison(s): Prior images unable to be directly viewed. FINDINGS  Left Ventricle: Left ventricular ejection fraction, by estimation, is 55 to 60%. The left ventricle has normal function. The left ventricle has no regional wall motion abnormalities. The left ventricular internal cavity size was mildly dilated. There is  mild concentric left ventricular hypertrophy. The interventricular septum is flattened in systole and diastole, consistent with right ventricular pressure and volume overload. Left ventricular diastolic function could not be evaluated due to atrial fibrillation. Left ventricular diastolic parameters are indeterminate. Right Ventricle: The right ventricular size is mildly enlarged. No increase in right ventricular wall thickness. Right ventricular systolic function is moderately reduced. There is moderately elevated pulmonary artery systolic pressure. The tricuspid regurgitant velocity is 3.28 m/s, and with an assumed right atrial pressure of 15 mmHg, the estimated right ventricular systolic pressure is 58.0 mmHg. Left Atrium: Left atrial size was mildly dilated. Right Atrium: Right atrial size was mildly dilated. Pericardium: There is no evidence of pericardial effusion. Presence of epicardial fat layer. Mitral Valve: The mitral valve is degenerative in appearance. Mild mitral annular calcification. Mild mitral valve regurgitation, with posteriorly-directed jet. Tricuspid Valve: The tricuspid valve is grossly normal. Tricuspid valve regurgitation is moderate to severe. Aortic Valve: The aortic valve was not well visualized. There is moderate calcification of the aortic valve. There is moderate aortic valve  annular calcification. Aortic valve regurgitation is not visualized. Aortic valve sclerosis/calcification is present, without any evidence of aortic stenosis. Pulmonic Valve: The pulmonic valve was grossly normal. Pulmonic valve regurgitation is trivial. Aorta: The aortic root is normal in size and structure. Venous: The inferior vena cava is dilated in size with less than 50% respiratory variability, suggesting right atrial pressure of 15 mmHg. IAS/Shunts: No atrial level shunt detected by color flow Doppler. Additional Comments: 3D was performed not requiring image post processing on an independent workstation and was indeterminate.  LEFT VENTRICLE PLAX 2D LVIDd:         6.30 cm   Diastology LVIDs:         4.30 cm   LV e' medial:  12.00 cm/s LV PW:         1.10 cm   LV e' lateral: 14.10 cm/s LV IVS:        1.00 cm LVOT diam:     2.10 cm LV SV:         59 LV SV Index:   21 LVOT Area:     3.46 cm  RIGHT VENTRICLE RV Basal diam:  5.85 cm RV S prime:     14.00 cm/s TAPSE (M-mode): 1.5 cm LEFT ATRIUM            Index        RIGHT ATRIUM           Index LA diam:      6.80 cm  2.41 cm/m   RA Area:     30.10 cm LA Vol (A4C): 102.0 ml 36.19 ml/m  RA Volume:   90.10 ml  31.97 ml/m  AORTIC  VALVE LVOT Vmax:   89.70 cm/s LVOT Vmean:  56.900 cm/s LVOT VTI:    0.171 m  AORTA Ao Root diam: 3.70 cm TRICUSPID VALVE TR Peak grad:   43.0 mmHg TR Vmax:        328.00 cm/s  SHUNTS Systemic VTI:  0.17 m Systemic Diam: 2.10 cm Jayson Sierras MD Electronically signed by Jayson Sierras MD Signature Date/Time: 09/10/2023/2:52:12 PM    Final    DG Chest Port 1 View Result Date: 09/08/2023 CLINICAL DATA:  sob EXAM: PORTABLE CHEST - 1 VIEW COMPARISON:  None available. FINDINGS: Diffuse bilateral perihilar interstitial opacities. Blunting of the right costophrenic angle with hazy airspace attenuation, likely reflecting a small effusion and atelectasis. Trace left pleural effusion. Cardiomegaly. Tortuous aorta with aortic atherosclerosis.  No acute fracture or destructive lesions. Multilevel thoracic osteophytosis. IMPRESSION: Cardiomegaly with findings of interstitial edema. Small right and trace left pleural effusions. Electronically Signed   By: Rogelia Myers M.D.   On: 09/08/2023 18:02    Alm Schneider, DO  [facility]  If 7PM-7AM, please contact night-coverage [email or web address] Password [facility] 09/25/2023, 5:56 PM   LOS: 17 days

## 2023-09-26 DIAGNOSIS — F1010 Alcohol abuse, uncomplicated: Secondary | ICD-10-CM | POA: Diagnosis not present

## 2023-09-26 DIAGNOSIS — I5033 Acute on chronic diastolic (congestive) heart failure: Secondary | ICD-10-CM | POA: Diagnosis not present

## 2023-09-26 LAB — BASIC METABOLIC PANEL WITH GFR
Anion gap: 13 (ref 5–15)
BUN: 34 mg/dL — ABNORMAL HIGH (ref 8–23)
CO2: 38 mmol/L — ABNORMAL HIGH (ref 22–32)
Calcium: 10.6 mg/dL — ABNORMAL HIGH (ref 8.9–10.3)
Chloride: 88 mmol/L — ABNORMAL LOW (ref 98–111)
Creatinine, Ser: 1.21 mg/dL (ref 0.61–1.24)
GFR, Estimated: >60 mL/min (ref >60)
Glucose, Bld: 102 mg/dL — ABNORMAL HIGH (ref 70–99)
Potassium: 3.5 mmol/L (ref 3.5–5.1)
Sodium: 139 mmol/L (ref 135–145)

## 2023-09-26 LAB — IRON AND TIBC
Iron: 31 ug/dL — ABNORMAL LOW (ref 45–182)
Saturation Ratios: 7 % — ABNORMAL LOW (ref 17.9–39.5)
TIBC: 433 ug/dL (ref 250–450)
UIBC: 402 ug/dL

## 2023-09-26 LAB — FERRITIN: Ferritin: 16 ng/mL — ABNORMAL LOW (ref 24–336)

## 2023-09-26 LAB — VITAMIN B12: Vitamin B-12: 247 pg/mL (ref 180–914)

## 2023-09-26 LAB — FOLATE: Folate: 36.8 ng/mL (ref >5.9)

## 2023-09-26 MED ORDER — DAPAGLIFLOZIN PROPANEDIOL 10 MG PO TABS
10.0000 mg | ORAL_TABLET | Freq: Every day | ORAL | Status: DC
  Administered 2023-09-26 – 2023-09-28 (×3): 10 mg via ORAL
  Filled 2023-09-26 (×3): qty 1

## 2023-09-26 MED ORDER — TORSEMIDE 20 MG PO TABS
80.0000 mg | ORAL_TABLET | Freq: Two times a day (BID) | ORAL | Status: DC
  Administered 2023-09-26 – 2023-09-28 (×4): 80 mg via ORAL
  Filled 2023-09-26 (×4): qty 4

## 2023-09-26 NOTE — Care Management Important Message (Signed)
 Important Message  Patient Details  Name: RAUL TORRANCE MRN: [id number] Date of Birth: [birth year]   Important Message Given:  Yes - Medicare IM     Marquavious Nazar L Shuronda Santino 09/26/2023, 11:55 AM

## 2023-09-26 NOTE — Plan of Care (Signed)

## 2023-09-26 NOTE — Progress Notes (Signed)
 Rounding Note   Patient Name: Frederick Brown Date of Encounter: 09/26/2023  [facility] Cardiologist: Maude Emmer, MD   Subjective SOB improving.   Scheduled Meds:  allopurinol   300 mg Oral Daily   docusate sodium   100 mg Oral BID   ferrous sulfate   325 mg Oral Q breakfast   folic acid   1 mg Oral Daily   leptospermum manuka honey  1 Application Topical Daily   melatonin  3 mg Oral QHS   metoprolol  tartrate  12.5 mg Oral BID   multivitamin with minerals  1 tablet Oral Daily   nystatin    Topical BID   pantoprazole   40 mg Oral Daily   polyethylene glycol  17 g Oral Daily   potassium chloride   40 mEq Oral Daily   rivaroxaban   20 mg Oral Daily   rosuvastatin   20 mg Oral Daily   senna  2 tablet Oral Daily   thiamine   100 mg Oral Daily   Or   thiamine   100 mg Intravenous Daily   Continuous Infusions:  furosemide  (LASIX ) 200 mg in dextrose  5 % 100 mL (2 mg/mL) infusion 18 mg/hr (09/25/23 1938)   PRN Meds: acetaminophen  **OR** acetaminophen , guaiFENesin -dextromethorphan , ondansetron  **OR** ondansetron  (ZOFRAN ) IV, polyethylene glycol   Vital Signs  Vitals:   09/25/23 0900 09/25/23 1320 09/25/23 2100 09/26/23 0500  BP:  (!) 100/52 (!) 105/49 (!) 107/56  Pulse:  83 86 80  Resp:  18 18 16   Temp:  98.2 F (36.8 C) 99.5 F (37.5 C) 98.4 F (36.9 C)  TempSrc:   Oral Oral  SpO2:  91% 91% 91%  Weight: 133 kg   127.5 kg  Height:        Intake/Output Summary (Last 24 hours) at 09/26/2023 0836 Last data filed at 09/26/2023 0600 Gross per 24 hour  Intake 707.99 ml  Output 4450 ml  Net -3742.01 ml      09/26/2023    5:00 AM 09/25/2023    9:00 AM 09/24/2023    5:10 AM  Last 3 Weights  Weight (lbs) 281 lb 1.4 oz 293 lb 3.4 oz 365 lb 15.4 oz  Weight (kg) 127.5 kg 132.999 kg 166 kg      Telemetry Rate controlled afib - Personally Reviewed  ECG  N/a - Personally Reviewed  Physical Exam  GEN: No acute distress.   Neck: No JVD Cardiac: irreg Respiratory:  Clear to auscultation bilaterally. GI: Soft, nontender, non-distended  MS: No edema; No deformity. Neuro:  Nonfocal  Psych: Normal affect   Labs High Sensitivity Troponin:   Recent Labs  Lab 09/08/23 1719 09/08/23 2128  TROPONINIHS 9 9     Chemistry Recent Labs  Lab 09/22/23 0304 09/23/23 0432 09/24/23 0440 09/25/23 0420 09/26/23 0400  NA 142   < > 141 140 139  K 3.9   < > 3.3* 3.3* 3.5  CL 98   < > 91* 86* 88*  CO2 35*   < > 40* 42* 38*  GLUCOSE 99   < > 104* 96 102*  BUN 29*   < > 28* 30* 34*  CREATININE 1.12   < > 1.15 1.03 1.21  CALCIUM  9.9   < > 9.7 10.4* 10.6*  MG 3.0*  --  2.8* 2.6*  --   GFRNONAA >60   < > >60 >60 >60  ANIONGAP 9   < > 10 12 13    < > = values in this interval not displayed.    Lipids No  results for input(s): CHOL, TRIG, HDL, LABVLDL, LDLCALC, CHOLHDL in the last 168 hours.  Hematology Recent Labs  Lab 09/22/23 0304 09/23/23 0432 09/24/23 0440  WBC 7.2 6.3 6.9  RBC 3.73* 3.74* 3.49*  HGB 8.3* 8.3* 7.8*  HCT 33.1* 33.9* 30.7*  MCV 88.7 90.6 88.0  MCH 22.3* 22.2* 22.3*  MCHC 25.1* 24.5* 25.4*  RDW 23.9* 23.9* 23.9*  PLT 252 266 275   Thyroid  No results for input(s): TSH, FREET4 in the last 168 hours.  BNPNo results for input(s): BNP, PROBNP in the last 168 hours.  DDimer No results for input(s): DDIMER in the last 168 hours.   Radiology  No results found.    Patient Profile   Frederick Brown is a 73 y.o. male with a hx of permanent atrial fibrillation, chronic HFpEF, HTN, HLD, alcohol use and OSA who is being seen 09/12/2023 for the evaluation of acute HFpEF at the request of Dr. Cindy.   Assessment & Plan   1.Acute on chronic HFpEF/RV failure - 08/2023 echo: LVEF 55-60%, no WMAs, indet diastolic function, D shaped septum, mod RV dysfunction, mod pulm HTN, mild LAE, mild MR, mod to severe TR  -BNP 875, CXR +interstitial edema, small right and trace left pleural effusion - exacerbation in diuretic  noncompliance at home   -on lasix  gtt at 18mg /hr, received acetazolamide  500mg  x 1 yesterday. Negative 4.6 L yesterday negative 39 L since admission . Renal function is stable. Not able to stand for weights, bed weights 407-->365 lbs yesterday, bed weights last few days appear inaccurate.  - significant diuresis during admission, at a point where can transition to oral diuretic. Start oral torsemide 80mg  bid. Follow as outpatient, pending status and labs may need once weekly metolazone  added in the future.  - start farxiga 10mg  daily.      - suspect pulmonary HTN and RV dysfunction related to LV diastolic dysfunction, OSA not treated. Very likely given BMI may have a component of OHS.  - Would plan on outpatient PFTs, sleep study, VQ scan.    2.Chronic afib - rate controlled, on xarelto  for stroke prevention       3. Anemia - admit Hgb 7.6, 1 year ago Hgb 11 - ferritin 5, FOBT neg - given IV iron   We will sign off inpatient care, arrange outpatient follow up .    For questions or updates, please contact [facility] Please consult [email or web address] for contact info under     Signed, Alvan Carrier, MD  09/26/2023, 8:36 AM

## 2023-09-26 NOTE — TOC Progression Note (Addendum)
 Transition of Care [facility]) - Progression Note    Patient Details  Name: Frederick Brown MRN: [id number] Date of Birth: 1950/05/27  Transition of Care [facility]) CM/SW Contact  Sharlyne Stabs, RN Phone Number: 09/26/2023, 11:26 AM  Clinical Narrative:   Patient is agreeable to [facility]. Shara has been started. PT will add an updated note today. TOC following.  Addendum : CMA has to restart AUTH for [facility] with correct ID [id number]. TOC following.  Expected Discharge Plan: Skilled Nursing Facility Barriers to Discharge: Insurance Authorization  Expected Discharge Plan and Services       Living arrangements for the past 2 months: Single Family Home                           HH Arranged: PT HH Agency: [facility]         Social Determinants of Health (SDOH) Interventions SDOH Screenings   Food Insecurity: Food Insecurity Present (09/08/2023)  Housing: Low Risk  (09/08/2023)  Transportation Needs: No Transportation Needs (09/08/2023)  Utilities: Not At Risk (09/08/2023)  Social Connections: Socially Isolated (09/08/2023)  Tobacco Use: Medium Risk (09/24/2023)    Readmission Risk Interventions    09/24/2023   12:54 PM 09/23/2023    9:39 AM  Readmission Risk Prevention Plan  Transportation Screening Complete Complete  Home Care Screening Complete Complete  Medication Review (RN CM) Complete Complete

## 2023-09-26 NOTE — Progress Notes (Signed)
 Physical Therapy Treatment Patient Details Name: Frederick Brown MRN: [id number] DOB: [birth year] Today's Date: 09/26/2023   History of Present Illness Frederick Brown is a 73 y.o. male with medical history significant for diastolic CHF, alcohol abuse, atrial fibrillation, hypertension, gout, morbid obesity.  Patient presented to the ED with complaints of generalized weakness, and increased difficulty breathing with exertion with bilateral lower extremity swelling for the past 3 weeks, with abdominal distention.  He supposed to be on Lasix  40 mg TID but has not been compliant.  His alcohol daily-about 2 glasses of vodka daily.  He lives alone and has had difficulty ambulating (due to weakness, dyspnea and leg swelling) to get his alcohol beverages hence he has not had any in the past 3 to 4 days.  Denies hallucinations or shakes. He denies history of alcohol withdrawal.    PT Comments  Patient agreeable for therapy. Patient demonstrates slow labored movement for sitting up at bedside requiring HOB raised and use of bed rails for sitting up at bedside and once seated abe to complete BLE exercises with verbal cueing. Patient attempted repeated sit to stands from bedside, but unable to lift bottom off bed due to c/o BLE weakness and fatigue. Patient put back to bed with fair/good carryover for pulling self up with bed in head down position. Patient will benefit from continued skilled physical therapy in hospital and recommended venue below to increase strength, balance, endurance for safe ADLs and gait.      If plan is discharge home, recommend the following: A lot of help with bathing/dressing/bathroom;A lot of help with walking and/or transfers;Help with stairs or ramp for entrance;Assistance with cooking/housework   Can travel by private vehicle     No  Equipment Recommendations  None recommended by PT    Recommendations for Other Services       Precautions / Restrictions  Precautions Precautions: Fall Recall of Precautions/Restrictions: Intact Restrictions Weight Bearing Restrictions Per Provider Order: No     Mobility  Bed Mobility Overal bed mobility: Needs Assistance Bed Mobility: Supine to Sit, Sit to Supine     Supine to sit: Mod assist, HOB elevated Sit to supine: Mod assist, Max assist   General bed mobility comments: slow labored movement requiring HOB raised during supine to sitting, and had difficulty moving legs when put back to bed    Transfers                        Ambulation/Gait                   Stairs             Wheelchair Mobility     Tilt Bed    Modified Rankin (Stroke Patients Only)       Balance Overall balance assessment: Needs assistance Sitting-balance support: Feet supported, No upper extremity supported Sitting balance-Leahy Scale: Fair Sitting balance - Comments: fair/good seated at EOB                                    Communication Communication Communication: No apparent difficulties  Cognition Arousal: Alert Behavior During Therapy: WFL for tasks assessed/performed, Anxious   PT - Cognitive impairments: No apparent impairments                         Following commands: Intact  Cueing Cueing Techniques: Verbal cues, Tactile cues  Exercises General Exercises - Lower Extremity Long Arc Quad: Seated, AROM, Strengthening, Both, 10 reps Hip Flexion/Marching: Seated, AROM, Strengthening, Both, 10 reps Toe Raises: Seated, AROM, Strengthening, Both, 20 reps Heel Raises: Seated, AROM, Strengthening, Both, 20 reps    General Comments        Pertinent Vitals/Pain Pain Assessment Pain Assessment: Faces Faces Pain Scale: Hurts little more Pain Location: bilateral lower legs Pain Descriptors / Indicators: Discomfort, Grimacing, Sore Pain Intervention(s): Limited activity within patient's tolerance, Monitored during session, Repositioned     Home Living                          Prior Function            PT Goals (current goals can now be found in the care plan section) Acute Rehab PT Goals Patient Stated Goal: return home with neighbor, friends to assist PT Goal Formulation: With patient Time For Goal Achievement: 10/09/23 Potential to Achieve Goals: Good Progress towards PT goals: Progressing toward goals    Frequency    Min 3X/week      PT Plan      Co-evaluation              AM-PAC PT 6 Clicks Mobility   Outcome Measure  Help needed turning from your back to your side while in a flat bed without using bedrails?: A Lot Help needed moving from lying on your back to sitting on the side of a flat bed without using bedrails?: A Lot Help needed moving to and from a bed to a chair (including a wheelchair)?: Total Help needed standing up from a chair using your arms (e.g., wheelchair or bedside chair)?: Total Help needed to walk in hospital room?: Total Help needed climbing 3-5 steps with a railing? : Total 6 Click Score: 8    End of Session Equipment Utilized During Treatment: Oxygen  Activity Tolerance: Patient tolerated treatment well;Patient limited by fatigue;Patient limited by pain Patient left: in bed;with call bell/phone within reach Nurse Communication: Mobility status PT Visit Diagnosis: Unsteadiness on feet (R26.81);Other abnormalities of gait and mobility (R26.89);Muscle weakness (generalized) (M62.81)     Time: 8887-8861 PT Time Calculation (min) (ACUTE ONLY): 26 min  Charges:    $Therapeutic Exercise: 8-22 mins $Therapeutic Activity: 8-22 mins PT General Charges $$ ACUTE PT VISIT: 1 Visit                     2:58 PM, 09/26/23 Lynwood Music, MPT Physical Therapist with [facility] [phone] office [phone] mobile phone

## 2023-09-26 NOTE — Progress Notes (Addendum)
 PROGRESS NOTE  Frederick Brown FMW:[id number] DOB: 1950-06-14 DOA: 09/08/2023 PCP: [name], MD  Brief History:  73 y.o. male with medical history significant for diastolic CHF, alcohol abuse, atrial fibrillation, hypertension, gout, morbid obesity.  Presented with generalized weakness, shortness of breath and worsening leg swelling.  He had not been compliant with his furosemide  at home.  Drinks about 2 glasses of vodka on a daily basis.  He was hospitalized for further management of volume overload.  There's been no signs of alcohol withdrawal.  Hospitalization has been prolonged due to massive fluid overload requiring IV lasix  continuous infustion.  Cardiology was consulted    Assessment/Plan: Acute on chronic diastolic CHF/acute respiratory failure with hypoxia and hypercarbia Pt admitted to noncomliance with diuretic Last echo from 2018 showed LVEF of 55%. - 09/10/23 Echo EF 55-60%, no WMA, IV septum flattening Strict ins and outs and daily weights. -Pt has not been compliant with fluid restriction  -Negative 38L -CVP not being measured anymore because patient pulled out PICC line -Remains vol overloaded  -Cardiology consulted 6/13 with recommendations to transition to Lasix  drip which he remains on at this time - Currently on 4 L nasal cannula oxygen  and typically on room air at home - Continues to have aggressive diuresis and Lasix  drip increased to 18 mg/h by cardiology on 6/23 - PICC line pulled out by patient, IV lasix  infusion and Diamox /metolazone  dosing per cardiology - CVP was monitored by cardiology team now via PICC line however PICC was pulled out by patient  - now has PIV -09/26/23--discussed with Dr. Walt to torsemide 80 mg bid   Alcohol abuse Counseled.  Pt claims that he has only 2 drinks every evening while cooking his meal.  Continue CIWA protocol thiamine  folate multivitamins.   -No signs of withdrawal.   Persistent atrial  fibrillation -continue Xarelto  -continue metoprolol  tartrate   Chronic Constipation - s/p molasses enema with large BM - start daily miralax  and senna    Normocytic anemia Hemoglobin of 8.1 today.  No bleeding episodes.  Patient denies any black stools.  Stool was Hemoccult negative.  Patient reported that he had a colonoscopy within the last 2 to 3 years and which was unremarkable. -On chart review, noted colonoscopy in 2009 with 3 tubular adenomas  Iron  level is low at 16 with sat of 4%. S/p dose of IV iron . On iron  supplementation -Hgb remains stable   Chronic kidney disease stage IIIa -baseline creatinine 1.1-1.4 -Renal function is stable with diuresis.     Essential hypertension -well controlled   History of gout Continue allopurinol .   Morbid obesity Estimated body mass index is 58.01 kg/m as calculated from the following:   Height as of this encounter: 5' 10 (1.778 m).   Weight as of this encounter: 183.4 kg.   Obesity Hypoventilation Syndrome Ordered CPAP use while sleeping however patient refuses to use it   Hypokalemia -repleting      Family Communication:  no Family at bedside   Consultants:  cardiology   Code Status:  FULL    DVT Prophylaxis: Xarelto    Procedures: As Listed in Progress Note Above   Antibiotics: None         Subjective: Patient denies fevers, chills, headache, chest pain, dyspnea, nausea, vomiting, diarrhea, abdominal pain, dysuria, hematuria, hematochezia, and melena.   Objective: Vitals:   09/25/23 2100 09/26/23 0500 09/26/23 0937 09/26/23 1300  BP: (!) 105/49 (!) 107/56 (!) 109/53 ROLLEN)  108/47  Pulse: 86 80 88 70  Resp: 18 16  16   Temp: 99.5 F (37.5 C) 98.4 F (36.9 C)  98 F (36.7 C)  TempSrc: Oral Oral  Oral  SpO2: 91% 91%  98%  Weight:  127.5 kg    Height:        Intake/Output Summary (Last 24 hours) at 09/26/2023 1854 Last data filed at 09/26/2023 1704 Gross per 24 hour  Intake 635.2 ml  Output 2800 ml   Net -2164.8 ml   Weight change:  Exam:  General:  Pt is alert, follows commands appropriately, not in acute distress HEENT: No icterus, No thrush, No neck mass, [place]/AT Cardiovascular: RRR, S1/S2, no rubs, no gallops Respiratory: CTA bilaterally, no wheezing, no crackles, no rhonchi Abdomen: Soft/+BS, non tender, non distended, no guarding Extremities: trace LE edema, No lymphangitis, No petechiae, No rashes, no synovitis   Data Reviewed: I have personally reviewed following labs and imaging studies Basic Metabolic Panel: Recent Labs  Lab 09/22/23 0304 09/23/23 0432 09/24/23 0440 09/25/23 0420 09/26/23 0400  NA 142 144 141 140 139  K 3.9 4.1 3.3* 3.3* 3.5  CL 98 97* 91* 86* 88*  CO2 35* 37* 40* 42* 38*  GLUCOSE 99 109* 104* 96 102*  BUN 29* 26* 28* 30* 34*  CREATININE 1.12 1.12 1.15 1.03 1.21  CALCIUM  9.9 9.7 9.7 10.4* 10.6*  MG 3.0*  --  2.8* 2.6*  --    Liver Function Tests: No results for input(s): AST, ALT, ALKPHOS, BILITOT, PROT, ALBUMIN in the last 168 hours. No results for input(s): LIPASE, AMYLASE in the last 168 hours. Recent Labs  Lab 09/23/23 1243  AMMONIA 29   Coagulation Profile: No results for input(s): INR, PROTIME in the last 168 hours. CBC: Recent Labs  Lab 09/20/23 0405 09/21/23 0442 09/22/23 0304 09/23/23 0432 09/24/23 0440  WBC 6.4 5.9 7.2 6.3 6.9  HGB 7.5* 7.3* 8.3* 8.3* 7.8*  HCT 31.0* 30.2* 33.1* 33.9* 30.7*  MCV 88.3 88.0 88.7 90.6 88.0  PLT 219 225 252 266 275   Cardiac Enzymes: No results for input(s): CKTOTAL, CKMB, CKMBINDEX, TROPONINI in the last 168 hours. BNP: Invalid input(s): POCBNP CBG: No results for input(s): GLUCAP in the last 168 hours. HbA1C: No results for input(s): HGBA1C in the last 72 hours. Urine analysis:    Component Value Date/Time   COLORURINE YELLOW 01/17/2015 1637   APPEARANCEUR HAZY (A) 01/17/2015 1637   LABSPEC 1.015 01/17/2015 1637   PHURINE 7.0 01/17/2015 1637    GLUCOSEU NEGATIVE 01/17/2015 1637   HGBUR SMALL (A) 01/17/2015 1637   BILIRUBINUR SMALL (A) 01/17/2015 1637   KETONESUR NEGATIVE 01/17/2015 1637   PROTEINUR 30 (A) 01/17/2015 1637   UROBILINOGEN 1.0 01/17/2015 1637   NITRITE NEGATIVE 01/17/2015 1637   LEUKOCYTESUR LARGE (A) 01/17/2015 1637   Sepsis Labs: @LABRCNTIP (procalcitonin:4,lacticidven:4) )No results found for this or any previous visit (from the past 240 hours).   Scheduled Meds:  allopurinol   300 mg Oral Daily   dapagliflozin propanediol  10 mg Oral Daily   docusate sodium   100 mg Oral BID   ferrous sulfate   325 mg Oral Q breakfast   folic acid   1 mg Oral Daily   leptospermum manuka honey  1 Application Topical Daily   melatonin  3 mg Oral QHS   metoprolol  tartrate  12.5 mg Oral BID   multivitamin with minerals  1 tablet Oral Daily   nystatin    Topical BID   pantoprazole   40 mg Oral  Daily   polyethylene glycol  17 g Oral Daily   potassium chloride   40 mEq Oral Daily   rivaroxaban   20 mg Oral Daily   rosuvastatin   20 mg Oral Daily   senna  2 tablet Oral Daily   thiamine   100 mg Oral Daily   Or   thiamine   100 mg Intravenous Daily   torsemide  80 mg Oral BID   Continuous Infusions:  Procedures/Studies: DG CHEST PORT 1 VIEW Result Date: 09/16/2023 CLINICAL DATA:  PICC placement EXAM: PORTABLE CHEST 1 VIEW COMPARISON:  09/08/2023 FINDINGS: Single frontal view of the chest demonstrates right-sided PICC tip overlying the atriocaval junction. Cardiac silhouette remains enlarged. Persistent vascular congestion. Costophrenic angles are excluded by collimation. No pneumothorax. IMPRESSION: 1. Right-sided PICC tip overlying atriocaval junction. 2. Stable enlarged cardiac silhouette and pulmonary vascular congestion. Electronically Signed   By: Ozell Daring M.D.   On: 09/16/2023 20:09   US  EKG SITE RITE Result Date: 09/16/2023 If Site Rite image not attached, placement could not be confirmed due to current cardiac  rhythm.  ECHOCARDIOGRAM COMPLETE Result Date: 09/10/2023    ECHOCARDIOGRAM REPORT   Patient Name:   Frederick Brown Date of Exam: 09/10/2023 Medical Rec #:  [id number]       Height:       70.0 in Accession #:    [id number]      Weight:       405.4 lb Date of Birth:  1951/01/31       BSA:          2.818 m Patient Age:    73 years        BP:           129/58 mmHg Patient Gender: M               HR:           67 bpm. Exam Location:  [facility] Procedure: 2D Echo, Cardiac Doppler and Color Doppler (Both Spectral and Color            Flow Doppler were utilized during procedure). Indications:    Congestive Heart Failure I50.9  History:        Patient has prior history of Echocardiogram examinations, most                 recent 01/07/2017. CHF; Risk Factors:Hypertension.  Sonographer:    Koleen Popper RDCS Referring Phys: [id number] EJIROGHENE E EMOKPAE IMPRESSIONS  1. Left ventricular ejection fraction, by estimation, is 55 to 60%. The left ventricle has normal function. The left ventricle has no regional wall motion abnormalities. The left ventricular internal cavity size was mildly dilated. There is mild concentric left ventricular hypertrophy. Left ventricular diastolic parameters are indeterminate. There is the interventricular septum is flattened in systole and diastole, consistent with right ventricular pressure and volume overload.  2. Right ventricular systolic function is moderately reduced. The right ventricular size is mildly enlarged with prominent apical trabeculation. There is moderately elevated pulmonary artery systolic pressure. The estimated right ventricular systolic pressure is 58.0 mmHg.  3. Left atrial size was mildly dilated.  4. Right atrial size was mildly dilated.  5. The mitral valve is degenerative. Mild mitral valve regurgitation.  6. Tricuspid valve regurgitation is moderate to severe.  7. The aortic valve was not well visualized. There is moderate calcification of the aortic valve. Aortic valve  regurgitation is not visualized. Aortic valve sclerosis/calcification is present, without any evidence of aortic stenosis.  8. The inferior vena cava is dilated in size with <50% respiratory variability, suggesting right atrial pressure of 15 mmHg. Comparison(s): Prior images unable to be directly viewed. FINDINGS  Left Ventricle: Left ventricular ejection fraction, by estimation, is 55 to 60%. The left ventricle has normal function. The left ventricle has no regional wall motion abnormalities. The left ventricular internal cavity size was mildly dilated. There is  mild concentric left ventricular hypertrophy. The interventricular septum is flattened in systole and diastole, consistent with right ventricular pressure and volume overload. Left ventricular diastolic function could not be evaluated due to atrial fibrillation. Left ventricular diastolic parameters are indeterminate. Right Ventricle: The right ventricular size is mildly enlarged. No increase in right ventricular wall thickness. Right ventricular systolic function is moderately reduced. There is moderately elevated pulmonary artery systolic pressure. The tricuspid regurgitant velocity is 3.28 m/s, and with an assumed right atrial pressure of 15 mmHg, the estimated right ventricular systolic pressure is 58.0 mmHg. Left Atrium: Left atrial size was mildly dilated. Right Atrium: Right atrial size was mildly dilated. Pericardium: There is no evidence of pericardial effusion. Presence of epicardial fat layer. Mitral Valve: The mitral valve is degenerative in appearance. Mild mitral annular calcification. Mild mitral valve regurgitation, with posteriorly-directed jet. Tricuspid Valve: The tricuspid valve is grossly normal. Tricuspid valve regurgitation is moderate to severe. Aortic Valve: The aortic valve was not well visualized. There is moderate calcification of the aortic valve. There is moderate aortic valve annular calcification. Aortic valve regurgitation  is not visualized. Aortic valve sclerosis/calcification is present, without any evidence of aortic stenosis. Pulmonic Valve: The pulmonic valve was grossly normal. Pulmonic valve regurgitation is trivial. Aorta: The aortic root is normal in size and structure. Venous: The inferior vena cava is dilated in size with less than 50% respiratory variability, suggesting right atrial pressure of 15 mmHg. IAS/Shunts: No atrial level shunt detected by color flow Doppler. Additional Comments: 3D was performed not requiring image post processing on an independent workstation and was indeterminate.  LEFT VENTRICLE PLAX 2D LVIDd:         6.30 cm   Diastology LVIDs:         4.30 cm   LV e' medial:  12.00 cm/s LV PW:         1.10 cm   LV e' lateral: 14.10 cm/s LV IVS:        1.00 cm LVOT diam:     2.10 cm LV SV:         59 LV SV Index:   21 LVOT Area:     3.46 cm  RIGHT VENTRICLE RV Basal diam:  5.85 cm RV S prime:     14.00 cm/s TAPSE (M-mode): 1.5 cm LEFT ATRIUM            Index        RIGHT ATRIUM           Index LA diam:      6.80 cm  2.41 cm/m   RA Area:     30.10 cm LA Vol (A4C): 102.0 ml 36.19 ml/m  RA Volume:   90.10 ml  31.97 ml/m  AORTIC VALVE LVOT Vmax:   89.70 cm/s LVOT Vmean:  56.900 cm/s LVOT VTI:    0.171 m  AORTA Ao Root diam: 3.70 cm TRICUSPID VALVE TR Peak grad:   43.0 mmHg TR Vmax:        328.00 cm/s  SHUNTS Systemic VTI:  0.17 m Systemic Diam: 2.10 cm Jayson  Mcdowell MD Electronically signed by Jayson Sierras MD Signature Date/Time: 09/10/2023/2:52:12 PM    Final    DG Chest Port 1 View Result Date: 09/08/2023 CLINICAL DATA:  sob EXAM: PORTABLE CHEST - 1 VIEW COMPARISON:  None available. FINDINGS: Diffuse bilateral perihilar interstitial opacities. Blunting of the right costophrenic angle with hazy airspace attenuation, likely reflecting a small effusion and atelectasis. Trace left pleural effusion. Cardiomegaly. Tortuous aorta with aortic atherosclerosis. No acute fracture or destructive lesions.  Multilevel thoracic osteophytosis. IMPRESSION: Cardiomegaly with findings of interstitial edema. Small right and trace left pleural effusions. Electronically Signed   By: Rogelia Myers M.D.   On: 09/08/2023 18:02    Alm Schneider, DO  [facility]  If 7PM-7AM, please contact night-coverage [email or web address] Password [email or web address] 09/26/2023, 6:54 PM   LOS: 18 days

## 2023-09-26 NOTE — Progress Notes (Signed)
 Pt sat on side of the bed to eat breakfast this morning. He sat up for approx. 20 minutes.

## 2023-09-27 DIAGNOSIS — F1010 Alcohol abuse, uncomplicated: Secondary | ICD-10-CM | POA: Diagnosis not present

## 2023-09-27 DIAGNOSIS — I10 Essential (primary) hypertension: Secondary | ICD-10-CM | POA: Diagnosis not present

## 2023-09-27 DIAGNOSIS — I5033 Acute on chronic diastolic (congestive) heart failure: Secondary | ICD-10-CM | POA: Diagnosis not present

## 2023-09-27 LAB — BASIC METABOLIC PANEL WITH GFR
Anion gap: 15 (ref 5–15)
BUN: 39 mg/dL — ABNORMAL HIGH (ref 8–23)
CO2: 36 mmol/L — ABNORMAL HIGH (ref 22–32)
Calcium: 10.8 mg/dL — ABNORMAL HIGH (ref 8.9–10.3)
Chloride: 88 mmol/L — ABNORMAL LOW (ref 98–111)
Creatinine, Ser: 1.2 mg/dL (ref 0.61–1.24)
GFR, Estimated: >60 mL/min (ref >60)
Glucose, Bld: 110 mg/dL — ABNORMAL HIGH (ref 70–99)
Potassium: 3.3 mmol/L — ABNORMAL LOW (ref 3.5–5.1)
Sodium: 139 mmol/L (ref 135–145)

## 2023-09-27 LAB — MAGNESIUM: Magnesium: 2.7 mg/dL — ABNORMAL HIGH (ref 1.7–2.4)

## 2023-09-27 MED ORDER — CYANOCOBALAMIN 1000 MCG/ML IJ SOLN
1000.0000 ug | Freq: Once | INTRAMUSCULAR | Status: AC
  Administered 2023-09-27: 1000 ug via INTRAMUSCULAR
  Filled 2023-09-27: qty 1

## 2023-09-27 MED ORDER — POLYETHYLENE GLYCOL 3350 17 G PO PACK
17.0000 g | PACK | Freq: Every day | ORAL | Status: AC
Start: 2023-09-28 — End: ?

## 2023-09-27 MED ORDER — VITAMIN B-1 100 MG PO TABS
100.0000 mg | ORAL_TABLET | Freq: Every day | ORAL | Status: AC
Start: 2023-09-28 — End: ?

## 2023-09-27 MED ORDER — METOPROLOL TARTRATE 25 MG PO TABS
12.5000 mg | ORAL_TABLET | Freq: Two times a day (BID) | ORAL | Status: AC
Start: 2023-09-27 — End: ?

## 2023-09-27 MED ORDER — POTASSIUM CHLORIDE CRYS ER 20 MEQ PO TBCR: 40.0000 meq | EXTENDED_RELEASE_TABLET | Freq: Every day | ORAL | Status: AC

## 2023-09-27 MED ORDER — POLYETHYLENE GLYCOL 3350 17 G PO PACK: 17.0000 g | PACK | Freq: Every day | ORAL | Status: AC | PRN

## 2023-09-27 MED ORDER — FERROUS SULFATE 325 (65 FE) MG PO TABS: 325.0000 mg | ORAL_TABLET | Freq: Two times a day (BID) | ORAL | Status: AC

## 2023-09-27 MED ORDER — DAPAGLIFLOZIN PROPANEDIOL 10 MG PO TABS
10.0000 mg | ORAL_TABLET | Freq: Every day | ORAL | Status: AC
Start: 2023-09-28 — End: ?

## 2023-09-27 MED ORDER — TORSEMIDE 40 MG PO TABS: 80.0000 mg | ORAL_TABLET | Freq: Two times a day (BID) | ORAL | Status: AC

## 2023-09-27 MED ORDER — VITAMIN B-12 100 MCG PO TABS
500.0000 ug | ORAL_TABLET | Freq: Every day | ORAL | Status: DC
  Administered 2023-09-28: 500 ug via ORAL
  Filled 2023-09-27: qty 5

## 2023-09-27 MED ORDER — FOLIC ACID 1 MG PO TABS
1.0000 mg | ORAL_TABLET | Freq: Every day | ORAL | Status: AC
Start: 2023-09-28 — End: ?

## 2023-09-27 MED ORDER — CYANOCOBALAMIN 500 MCG PO TABS: 500.0000 ug | ORAL_TABLET | Freq: Every day | ORAL | Status: AC

## 2023-09-27 NOTE — Discharge Summary (Signed)
 Physician Discharge Summary   Patient: Frederick Brown MRN: [id number] DOB: [birth year]  Admit date:     09/08/2023  Discharge date: 09/27/23  Discharge Physician: Alm Inessa Wardrop   PCP: [name], MD   Recommendations at discharge:   Please follow up with primary care provider within 1-2 weeks  Please repeat BMP and CBC in one week 3      Currently on 3-4L [place].  Wean off to RA for saturation >92%   Hospital Course: 73 y.o. male with medical history significant for diastolic CHF, alcohol abuse, atrial fibrillation, hypertension, gout, morbid obesity.  Presented with generalized weakness, shortness of breath and worsening leg swelling.  He had not been compliant with his furosemide  at home.  Drinks about 2 glasses of vodka on a daily basis.  He was hospitalized for further management of volume overload.  There's been no signs of alcohol withdrawal.  Hospitalization has been prolonged due to massive fluid overload requiring IV lasix  continuous infustion.  Cardiology was consulted.  Metolazone  was given intermittently.  He diuresed 39L.  He improved clinically.  He was transitioned to torsemide 80 mg bid with daily KCl supplementation.  Assessment and Plan: Acute on chronic diastolic CHF/acute respiratory failure with hypoxia and hypercarbia Pt admitted to noncomliance with diuretic Last echo from 2018 showed LVEF of 55%. - 09/10/23 Echo EF 55-60%, no WMA, IV septum flattening Strict ins and outs and daily weights. -Pt had not been compliant with fluid restriction  -Negative 39L;  weights have not bee accurate -CVP not being measured anymore because patient pulled out PICC line -Remains vol overloaded  -Cardiology consulted 6/13 with recommendations to transition to Lasix  drip which he remains on at this time - Currently on 4 L nasal cannula oxygen  and typically on room air at home - Continues to have aggressive diuresis and Lasix  drip increased to 18 mg/h by cardiology on 6/23 - PICC line  pulled out by patient, IV lasix  infusion and Diamox /metolazone  dosing per cardiology - CVP was monitored by cardiology team now via PICC line however PICC was pulled out by patient  - now has PIV -09/26/23--discussed with Dr. Walt to torsemide 80 mg bid   Alcohol abuse Counseled.  Pt claims that he has only 2 drinks every evening while cooking his meal.   -Continue CIWA protocol thiamine  folate multivitamins.   -No signs of withdrawal.   Persistent atrial fibrillation -continue Xarelto  -continue metoprolol  tartrate   Chronic Constipation - s/p molasses enema with large BM - start daily miralax  and senna during hospitalization - d/c with daily miralax    Normocytic anemia Hemoglobin of 8.1 today.  No bleeding episodes.  Patient denies any black stools.  Stool was Hemoccult negative.  Patient reported that he had a colonoscopy within the last 2 to 3 years and which was unremarkable. -On chart review, noted colonoscopy in 2009 with 3 tubular adenomas  Iron  level is low at 16 with sat of 4%. S/p dose of IV iron . On iron  supplementation>>increase to bid -Hgb remains stable -give folic acid  and B12 supplements as they were low normal   Chronic kidney disease stage IIIa -baseline creatinine 1.1-1.4 -Renal function is stable with diuresis.   -serum creatinine 1.20 on day of dc   Essential hypertension -well controlled   History of gout Continue allopurinol .   Morbid obesity Estimated body mass index is 58.01 kg/m as calculated from the following:   Height as of this encounter: 5' 10 (1.778 m).   Weight as of  this encounter: 183.4 kg.   Obesity Hypoventilation Syndrome Ordered CPAP use while sleeping however patient refuses to use it   Hypokalemia -repleting       Consultants: cardiology Procedures performed: none  Disposition: Skilled nursing facility Diet recommendation:  Cardiac diet DISCHARGE MEDICATION: Allergies as of 09/27/2023   No Known  Allergies      Medication List     STOP taking these medications    furosemide  40 MG tablet Commonly known as: LASIX        TAKE these medications    acetaminophen  500 MG tablet Commonly known as: TYLENOL  Take 1,000 mg by mouth every 8 (eight) hours as needed for mild pain (pain score 1-3) or headache.   allopurinol  300 MG tablet Commonly known as: ZYLOPRIM  Take 300 mg by mouth daily.   cyanocobalamin  500 MCG tablet Commonly known as: VITAMIN B12 Take 1 tablet (500 mcg total) by mouth daily. Start taking on: September 28, 2023   dapagliflozin propanediol 10 MG Tabs tablet Commonly known as: FARXIGA Take 1 tablet (10 mg total) by mouth daily. Start taking on: September 28, 2023   ferrous sulfate  325 (65 FE) MG tablet Take 1 tablet (325 mg total) by mouth 2 (two) times daily with a meal.   folic acid  1 MG tablet Commonly known as: FOLVITE  Take 1 tablet (1 mg total) by mouth daily. Start taking on: September 28, 2023   Melatonin 10 MG Tabs Take 1 tablet by mouth at bedtime.   metoprolol  tartrate 25 MG tablet Commonly known as: LOPRESSOR  Take 0.5 tablets (12.5 mg total) by mouth 2 (two) times daily. What changed: how much to take   omeprazole 40 MG capsule Commonly known as: PRILOSEC Take 40 mg by mouth 3 (three) times a week.   polyethylene glycol 17 g packet Commonly known as: MIRALAX  / GLYCOLAX  Take 17 g by mouth daily as needed for mild constipation.   polyethylene glycol 17 g packet Commonly known as: MIRALAX  / GLYCOLAX  Take 17 g by mouth daily. Start taking on: September 28, 2023   potassium chloride  SA 20 MEQ tablet Commonly known as: KLOR-CON  M Take 2 tablets (40 mEq total) by mouth daily. Start taking on: September 28, 2023   rosuvastatin  20 MG tablet Commonly known as: CRESTOR  Take 1 tablet (20 mg total) by mouth daily.   thiamine  100 MG tablet Commonly known as: Vitamin B-1 Take 1 tablet (100 mg total) by mouth daily. Start taking on: September 28, 2023    Torsemide 40 MG Tabs Take 80 mg by mouth 2 (two) times daily.   VITAMIN D -3 PO Take 1 tablet by mouth daily.   Xarelto  20 MG Tabs tablet Generic drug: rivaroxaban  TAKE 1 TABLET(20 MG) BY MOUTH DAILY WITH SUPPER        Contact information for follow-up providers     Care, [facility] Follow up.   Specialty: Home Health Services Why: Will contact you to schedule home health visits. Contact information: [address] [place] [address] [phone]         Miriam Norris, NP Follow up on 11/03/2023.   Specialty: Cardiology Why: Cardiology Hospital Follow-up on 11/03/2023 at 10:00 AM. Will be at the office in [place]. Contact information: [address] [place] [employer] [place] [address] [phone]              Contact information for after-discharge care     Destination     [facility] .  Service: Skilled Nursing Contact information: [address] [place]  [address] [phone]                    Discharge Exam: Filed Weights   09/25/23 0900 09/26/23 0500 09/27/23 0500  Weight: 133 kg 127.5 kg (!) 153.3 kg   HEENT:  [place]/AT, No thrush, no icterus CV:  RRR, no rub, no S3, no S4 Lung:  fine bibasilar crackles. No wheeze Abd:  soft/+BS, NT Ext:  trace LE edema, no lymphangitis, no synovitis, no rash   Condition at discharge: stable  The results of significant diagnostics from this hospitalization (including imaging, microbiology, ancillary and laboratory) are listed below for reference.   Imaging Studies: DG CHEST PORT 1 VIEW Result Date: 09/16/2023 CLINICAL DATA:  PICC placement EXAM: PORTABLE CHEST 1 VIEW COMPARISON:  09/08/2023 FINDINGS: Single frontal view of the chest demonstrates right-sided PICC tip overlying the atriocaval junction. Cardiac silhouette remains enlarged. Persistent vascular congestion. Costophrenic angles are excluded by collimation. No pneumothorax.  IMPRESSION: 1. Right-sided PICC tip overlying atriocaval junction. 2. Stable enlarged cardiac silhouette and pulmonary vascular congestion. Electronically Signed   By: Ozell Daring M.D.   On: 09/16/2023 20:09   US  EKG SITE RITE Result Date: 09/16/2023 If Site Rite image not attached, placement could not be confirmed due to current cardiac rhythm.  ECHOCARDIOGRAM COMPLETE Result Date: 09/10/2023    ECHOCARDIOGRAM REPORT   Patient Name:   SUVAN STCYR Date of Exam: 09/10/2023 Medical Rec #:  [id number]       Height:       70.0 in Accession #:    [id number]      Weight:       405.4 lb Date of Birth:  Nov 26, 1950       BSA:          2.818 m Patient Age:    73 years        BP:           129/58 mmHg Patient Gender: M               HR:           67 bpm. Exam Location:  [facility] Procedure: 2D Echo, Cardiac Doppler and Color Doppler (Both Spectral and Color            Flow Doppler were utilized during procedure). Indications:    Congestive Heart Failure I50.9  History:        Patient has prior history of Echocardiogram examinations, most                 recent 01/07/2017. CHF; Risk Factors:Hypertension.  Sonographer:    Koleen Popper RDCS Referring Phys: [id number] EJIROGHENE E EMOKPAE IMPRESSIONS  1. Left ventricular ejection fraction, by estimation, is 55 to 60%. The left ventricle has normal function. The left ventricle has no regional wall motion abnormalities. The left ventricular internal cavity size was mildly dilated. There is mild concentric left ventricular hypertrophy. Left ventricular diastolic parameters are indeterminate. There is the interventricular septum is flattened in systole and diastole, consistent with right ventricular pressure and volume overload.  2. Right ventricular systolic function is moderately reduced. The right ventricular size is mildly enlarged with prominent apical trabeculation. There is moderately elevated pulmonary artery systolic pressure. The estimated right ventricular systolic  pressure is 58.0 mmHg.  3. Left atrial size was mildly dilated.  4. Right atrial size was mildly dilated.  5. The mitral valve is degenerative. Mild  mitral valve regurgitation.  6. Tricuspid valve regurgitation is moderate to severe.  7. The aortic valve was not well visualized. There is moderate calcification of the aortic valve. Aortic valve regurgitation is not visualized. Aortic valve sclerosis/calcification is present, without any evidence of aortic stenosis.  8. The inferior vena cava is dilated in size with <50% respiratory variability, suggesting right atrial pressure of 15 mmHg. Comparison(s): Prior images unable to be directly viewed. FINDINGS  Left Ventricle: Left ventricular ejection fraction, by estimation, is 55 to 60%. The left ventricle has normal function. The left ventricle has no regional wall motion abnormalities. The left ventricular internal cavity size was mildly dilated. There is  mild concentric left ventricular hypertrophy. The interventricular septum is flattened in systole and diastole, consistent with right ventricular pressure and volume overload. Left ventricular diastolic function could not be evaluated due to atrial fibrillation. Left ventricular diastolic parameters are indeterminate. Right Ventricle: The right ventricular size is mildly enlarged. No increase in right ventricular wall thickness. Right ventricular systolic function is moderately reduced. There is moderately elevated pulmonary artery systolic pressure. The tricuspid regurgitant velocity is 3.28 m/s, and with an assumed right atrial pressure of 15 mmHg, the estimated right ventricular systolic pressure is 58.0 mmHg. Left Atrium: Left atrial size was mildly dilated. Right Atrium: Right atrial size was mildly dilated. Pericardium: There is no evidence of pericardial effusion. Presence of epicardial fat layer. Mitral Valve: The mitral valve is degenerative in appearance. Mild mitral annular calcification. Mild mitral  valve regurgitation, with posteriorly-directed jet. Tricuspid Valve: The tricuspid valve is grossly normal. Tricuspid valve regurgitation is moderate to severe. Aortic Valve: The aortic valve was not well visualized. There is moderate calcification of the aortic valve. There is moderate aortic valve annular calcification. Aortic valve regurgitation is not visualized. Aortic valve sclerosis/calcification is present, without any evidence of aortic stenosis. Pulmonic Valve: The pulmonic valve was grossly normal. Pulmonic valve regurgitation is trivial. Aorta: The aortic root is normal in size and structure. Venous: The inferior vena cava is dilated in size with less than 50% respiratory variability, suggesting right atrial pressure of 15 mmHg. IAS/Shunts: No atrial level shunt detected by color flow Doppler. Additional Comments: 3D was performed not requiring image post processing on an independent workstation and was indeterminate.  LEFT VENTRICLE PLAX 2D LVIDd:         6.30 cm   Diastology LVIDs:         4.30 cm   LV e' medial:  12.00 cm/s LV PW:         1.10 cm   LV e' lateral: 14.10 cm/s LV IVS:        1.00 cm LVOT diam:     2.10 cm LV SV:         59 LV SV Index:   21 LVOT Area:     3.46 cm  RIGHT VENTRICLE RV Basal diam:  5.85 cm RV S prime:     14.00 cm/s TAPSE (M-mode): 1.5 cm LEFT ATRIUM            Index        RIGHT ATRIUM           Index LA diam:      6.80 cm  2.41 cm/m   RA Area:     30.10 cm LA Vol (A4C): 102.0 ml 36.19 ml/m  RA Volume:   90.10 ml  31.97 ml/m  AORTIC VALVE LVOT Vmax:   89.70 cm/s LVOT Vmean:  56.900  cm/s LVOT VTI:    0.171 m  AORTA Ao Root diam: 3.70 cm TRICUSPID VALVE TR Peak grad:   43.0 mmHg TR Vmax:        328.00 cm/s  SHUNTS Systemic VTI:  0.17 m Systemic Diam: 2.10 cm Jayson Sierras MD Electronically signed by Jayson Sierras MD Signature Date/Time: 09/10/2023/2:52:12 PM    Final    DG Chest Port 1 View Result Date: 09/08/2023 CLINICAL DATA:  sob EXAM: PORTABLE CHEST - 1 VIEW  COMPARISON:  None available. FINDINGS: Diffuse bilateral perihilar interstitial opacities. Blunting of the right costophrenic angle with hazy airspace attenuation, likely reflecting a small effusion and atelectasis. Trace left pleural effusion. Cardiomegaly. Tortuous aorta with aortic atherosclerosis. No acute fracture or destructive lesions. Multilevel thoracic osteophytosis. IMPRESSION: Cardiomegaly with findings of interstitial edema. Small right and trace left pleural effusions. Electronically Signed   By: Rogelia Myers M.D.   On: 09/08/2023 18:02    Microbiology: Results for orders placed or performed during the hospital encounter of 02/22/15  Stone analysis     Status: None   Collection Time: 02/22/15 11:50 AM  Result Value Ref Range Status   Composition Comment  Corrected    Comment: Percentage (Represents the % composition)   Nidus No Nidus visualized  Corrected   Stone Weight KSTONE 11.2 mg Final   Color Tan  Final   Size Comment mm Final    Comment: Specimen received as fragments.   Ca Oxalate,Monohydr. 10 % Corrected   Ca phos cry stone ql IR 90 % Corrected   Photo Comment  Corrected    Comment: Photograph will follow under separate cover.   Comment: Comment  Corrected    Comment: (NOTE) Physician questions regarding Calculi Analysis contact [facility] at: [phone].    PLEASE NOTE: Comment  Corrected    Comment: (NOTE) Calculi report with photograph will follow via computer, mail or courier delivery. Performed at [facility]     Labs: CBC: Recent Labs  Lab 09/21/23 0442 09/22/23 0304 09/23/23 0432 09/24/23 0440  WBC 5.9 7.2 6.3 6.9  HGB 7.3* 8.3* 8.3* 7.8*  HCT 30.2* 33.1* 33.9* 30.7*  MCV 88.0 88.7 90.6 88.0  PLT 225 252 266 275   Basic Metabolic Panel: Recent Labs  Lab 09/22/23 0304 09/23/23 0432 09/24/23 0440 09/25/23 0420 09/26/23 0400 09/27/23 0741  NA 142 144 141 140 139 139  K 3.9 4.1 3.3* 3.3* 3.5 3.3*  CL 98 97* 91*  86* 88* 88*  CO2 35* 37* 40* 42* 38* 36*  GLUCOSE 99 109* 104* 96 102* 110*  BUN 29* 26* 28* 30* 34* 39*  CREATININE 1.12 1.12 1.15 1.03 1.21 1.20  CALCIUM  9.9 9.7 9.7 10.4* 10.6* 10.8*  MG 3.0*  --  2.8* 2.6*  --  2.7*   Liver Function Tests: No results for input(s): AST, ALT, ALKPHOS, BILITOT, PROT, ALBUMIN in the last 168 hours. CBG: No results for input(s): GLUCAP in the last 168 hours.  Discharge time spent: greater than 30 minutes.  Signed: Alm Schneider, MD [facility] 09/27/2023

## 2023-09-27 NOTE — Progress Notes (Signed)
   09/27/23 0900  ReDS Vest / Clip  Station Marker D  Ruler Value 45  ReDS Value Range < 36  ReDS Actual Value 23

## 2023-09-27 NOTE — Plan of Care (Signed)

## 2023-09-27 NOTE — Plan of Care (Signed)
  Problem: Education: Goal: Knowledge of General Education information will improve Description: Including pain rating scale, medication(s)/side effects and non-pharmacologic comfort measures Outcome: Progressing   Problem: Clinical Measurements: Goal: Diagnostic test results will improve Outcome: Progressing   Problem: Pain Managment: Goal: General experience of comfort will improve and/or be controlled Outcome: Progressing   Problem: Skin Integrity: Goal: Risk for impaired skin integrity will decrease Outcome: Progressing

## 2023-09-27 NOTE — Progress Notes (Signed)
 Report called to [facility], LPN

## 2023-09-27 NOTE — TOC Transition Note (Signed)
 Transition of Care [facility]) - Discharge Note   Patient Details  Name: Frederick Brown MRN: [id number] Date of Birth: [birth year]  Transition of Care [facility]) CM/SW Contact:  Nena LITTIE Coffee, RN Phone Number: 09/27/2023, 12:50 PM   Clinical Narrative:    Pt to dc to [facility] #A6, bed 2 this afternoon.   Final next level of care: Skilled Nursing Facility [facility] #A6 bed 2.) Barriers to Discharge: Barriers Resolved   Patient Goals and CMS Choice Patient states their goals for this hospitalization and ongoing recovery are:: Agreeable to SNF CMS Medicare.gov Compare Post Acute Care list provided to:: Patient        Discharge Placement                       Discharge Plan and Services Additional resources added to the After Visit Summary for                            [facility] Arranged: PT HH Agency: [facility]        Social Drivers of Health (SDOH) Interventions SDOH Screenings   Food Insecurity: Food Insecurity Present (09/08/2023)  Housing: Low Risk  (09/08/2023)  Transportation Needs: No Transportation Needs (09/08/2023)  Utilities: Not At Risk (09/08/2023)  Social Connections: Socially Isolated (09/08/2023)  Tobacco Use: Medium Risk (09/24/2023)     Readmission Risk Interventions    09/24/2023   12:54 PM 09/23/2023    9:39 AM  Readmission Risk Prevention Plan  Transportation Screening Complete Complete  Home Care Screening Complete Complete  Medication Review (RN CM) Complete Complete

## 2023-09-28 DIAGNOSIS — E8770 Fluid overload, unspecified: Secondary | ICD-10-CM | POA: Diagnosis not present

## 2023-09-28 DIAGNOSIS — I4891 Unspecified atrial fibrillation: Secondary | ICD-10-CM | POA: Diagnosis not present

## 2023-09-28 DIAGNOSIS — J9601 Acute respiratory failure with hypoxia: Secondary | ICD-10-CM | POA: Diagnosis not present

## 2023-09-28 DIAGNOSIS — I5033 Acute on chronic diastolic (congestive) heart failure: Secondary | ICD-10-CM | POA: Diagnosis not present

## 2023-09-28 NOTE — Plan of Care (Signed)
  Problem: Clinical Measurements: Goal: Ability to maintain clinical measurements within normal limits will improve Outcome: Progressing Goal: Will remain free from infection Outcome: Progressing Goal: Diagnostic test results will improve Outcome: Progressing Goal: Respiratory complications will improve Outcome: Progressing   Problem: Elimination: Goal: Will not experience complications related to bowel motility Outcome: Progressing Goal: Will not experience complications related to urinary retention Outcome: Progressing   Problem: Safety: Goal: Ability to remain free from injury will improve Outcome: Progressing   Problem: Skin Integrity: Goal: Risk for impaired skin integrity will decrease Outcome: Progressing

## 2023-09-28 NOTE — Discharge Summary (Signed)
 Physician Discharge Summary   Patient: Frederick Brown MRN: [id number] DOB: 03/29/1951  Admit date:     09/08/2023  Discharge date: 09/28/23  Discharge Physician: Alm Jeryn Cerney   PCP: [name], MD   Recommendations at discharge:   Please follow up with primary care provider within 1-2 weeks  Please repeat BMP and CBC in one week 3      Currently on 3L [place].  Wean off to RA for saturation >92%   Hospital Course: 73 y.o. male with medical history significant for diastolic CHF, alcohol abuse, atrial fibrillation, hypertension, gout, morbid obesity.  Presented with generalized weakness, shortness of breath and worsening leg swelling.  He had not been compliant with his furosemide  at home.  Drinks about 2 glasses of vodka on a daily basis.  He was hospitalized for further management of volume overload.  There's been no signs of alcohol withdrawal.  Hospitalization has been prolonged due to massive fluid overload requiring IV lasix  continuous infustion.  Cardiology was consulted.  Metolazone  was given intermittently.  He diuresed 39L.  He improved clinically.  He was transitioned to torsemide 80 mg bid with daily KCl supplementation. His discharge was delayed due to logistics regarding transport to SNF. He remained stable for d/c on 09/28/23  Assessment and Plan: Acute on chronic diastolic CHF/acute respiratory failure with hypoxia and hypercarbia Pt admitted to noncomliance with diuretic Last echo from 2018 showed LVEF of 55%. - 09/10/23 Echo EF 55-60%, no WMA, IV septum flattening Strict ins and outs and daily weights. -Pt had not been compliant with fluid restriction  -Negative 39L;  weights have not bee accurate -CVP not being measured anymore because patient pulled out PICC line -Remains vol overloaded  -Cardiology consulted 6/13 with recommendations to transition to Lasix  drip which he remains on at this time - Currently on 4 L nasal cannula oxygen  and typically on room air at home -  Continues to have aggressive diuresis and Lasix  drip increased to 18 mg/h by cardiology on 6/23 - PICC line pulled out by patient, IV lasix  infusion and Diamox /metolazone  dosing per cardiology - CVP was monitored by cardiology team now via PICC line however PICC was pulled out by patient  - now has PIV -09/26/23--discussed with Dr. Walt to torsemide 80 mg bid -09/28/23--remains stable for d/c   Alcohol abuse Counseled.  Pt claims that he has only 2 drinks every evening while cooking his meal.   -Continue CIWA protocol thiamine  folate multivitamins.   -No signs of withdrawal.   Persistent atrial fibrillation -continue Xarelto  -continue metoprolol  tartrate -rate controlled   Chronic Constipation - s/p molasses enema with large BM - start daily miralax  and senna during hospitalization - d/c with daily miralax    Normocytic anemia Hemoglobin of 8.1 today.  No bleeding episodes.  Patient denies any black stools.  Stool was Hemoccult negative.  Patient reported that he had a colonoscopy within the last 2 to 3 years and which was unremarkable. -On chart review, noted colonoscopy in 2009 with 3 tubular adenomas  Iron  level is low at 16 with sat of 4%. S/p dose of IV iron . On iron  supplementation>>increase to bid -Hgb remains stable -give folic acid  and B12 supplements as they were low normal   Chronic kidney disease stage IIIa -baseline creatinine 1.1-1.4 -Renal function is stable with diuresis.   -serum creatinine 1.20 on day of dc   Essential hypertension -well controlled   History of gout -Continue allopurinol .   Morbid obesity Estimated body mass index  is 58.01 kg/m as calculated from the following:   Height as of this encounter: 5' 10 (1.778 m).   Weight as of this encounter: 183.4 kg.   Obesity Hypoventilation Syndrome -Ordered CPAP use while sleeping however patient refuses to use it   Hypokalemia -repleting           Consultants:  cardiology Procedures performed: none  Disposition: Home Diet recommendation:  Cardiac diet DISCHARGE MEDICATION: Allergies as of 09/28/2023   No Known Allergies      Medication List     STOP taking these medications    furosemide  40 MG tablet Commonly known as: LASIX        TAKE these medications    acetaminophen  500 MG tablet Commonly known as: TYLENOL  Take 1,000 mg by mouth every 8 (eight) hours as needed for mild pain (pain score 1-3) or headache.   allopurinol  300 MG tablet Commonly known as: ZYLOPRIM  Take 300 mg by mouth daily.   cyanocobalamin  500 MCG tablet Commonly known as: VITAMIN B12 Take 1 tablet (500 mcg total) by mouth daily.   dapagliflozin propanediol 10 MG Tabs tablet Commonly known as: FARXIGA Take 1 tablet (10 mg total) by mouth daily.   ferrous sulfate  325 (65 FE) MG tablet Take 1 tablet (325 mg total) by mouth 2 (two) times daily with a meal.   folic acid  1 MG tablet Commonly known as: FOLVITE  Take 1 tablet (1 mg total) by mouth daily.   Melatonin 10 MG Tabs Take 1 tablet by mouth at bedtime.   metoprolol  tartrate 25 MG tablet Commonly known as: LOPRESSOR  Take 0.5 tablets (12.5 mg total) by mouth 2 (two) times daily. What changed: how much to take   omeprazole 40 MG capsule Commonly known as: PRILOSEC Take 40 mg by mouth 3 (three) times a week.   polyethylene glycol 17 g packet Commonly known as: MIRALAX  / GLYCOLAX  Take 17 g by mouth daily as needed for mild constipation.   polyethylene glycol 17 g packet Commonly known as: MIRALAX  / GLYCOLAX  Take 17 g by mouth daily.   potassium chloride  SA 20 MEQ tablet Commonly known as: KLOR-CON  M Take 2 tablets (40 mEq total) by mouth daily.   rosuvastatin  20 MG tablet Commonly known as: CRESTOR  Take 1 tablet (20 mg total) by mouth daily.   thiamine  100 MG tablet Commonly known as: Vitamin B-1 Take 1 tablet (100 mg total) by mouth daily.   Torsemide 40 MG Tabs Take 80 mg by mouth  2 (two) times daily.   VITAMIN D -3 PO Take 1 tablet by mouth daily.   Xarelto  20 MG Tabs tablet Generic drug: rivaroxaban  TAKE 1 TABLET(20 MG) BY MOUTH DAILY WITH SUPPER        Contact information for follow-up providers     Care, [facility] Follow up.   Specialty: Home Health Services Why: Will contact you to schedule home health visits. Contact information: [address] [place] [address] [phone]         Miriam Norris, NP Follow up on 11/03/2023.   Specialty: Cardiology Why: Cardiology Hospital Follow-up on 11/03/2023 at 10:00 AM. Will be at the office in [place]. Contact information: [address] [employer] [place] [address] [phone]              Contact information for after-discharge care     Destination     [facility] .   Service: Skilled Nursing Contact information: [address] [place]  [address] [phone]                    Discharge Exam: Filed Weights   09/26/23 0500 09/27/23 0500 09/28/23 0500  Weight: 127.5 kg (!) 153.3 kg 129.6 kg   HEENT:  [place]/AT, No thrush, no icterus CV:  RRR, no rub, no S3, no S4 Lung:  bibasilar crackles.  No wheeze Abd:  soft/+BS, NT Ext:  trace LE edema, no lymphangitis, no synovitis, no rash   Condition at discharge: stable  The results of significant diagnostics from this hospitalization (including imaging, microbiology, ancillary and laboratory) are listed below for reference.   Imaging Studies: DG CHEST PORT 1 VIEW Result Date: 09/16/2023 CLINICAL DATA:  PICC placement EXAM: PORTABLE CHEST 1 VIEW COMPARISON:  09/08/2023 FINDINGS: Single frontal view of the chest demonstrates right-sided PICC tip overlying the atriocaval junction. Cardiac silhouette remains enlarged. Persistent vascular congestion. Costophrenic angles are excluded by collimation. No pneumothorax. IMPRESSION: 1. Right-sided PICC tip overlying  atriocaval junction. 2. Stable enlarged cardiac silhouette and pulmonary vascular congestion. Electronically Signed   By: Ozell Daring M.D.   On: 09/16/2023 20:09   US  EKG SITE RITE Result Date: 09/16/2023 If Site Rite image not attached, placement could not be confirmed due to current cardiac rhythm.  ECHOCARDIOGRAM COMPLETE Result Date: 09/10/2023    ECHOCARDIOGRAM REPORT   Patient Name:   Frederick Brown Date of Exam: 09/10/2023 Medical Rec #:  [id number]       Height:       70.0 in Accession #:    [id number]      Weight:       405.4 lb Date of Birth:  05-26-1950       BSA:          2.818 m Patient Age:    73 years        BP:           129/58 mmHg Patient Gender: M               HR:           67 bpm. Exam Location:  [facility] Procedure: 2D Echo, Cardiac Doppler and Color Doppler (Both Spectral and Color            Flow Doppler were utilized during procedure). Indications:    Congestive Heart Failure I50.9  History:        Patient has prior history of Echocardiogram examinations, most                 recent 01/07/2017. CHF; Risk Factors:Hypertension.  Sonographer:    Koleen Popper RDCS Referring Phys: [id number] EJIROGHENE E EMOKPAE IMPRESSIONS  1. Left ventricular ejection fraction, by estimation, is 55 to 60%. The left ventricle has normal function. The left ventricle has no regional wall motion abnormalities. The left ventricular internal cavity size was mildly dilated. There is mild concentric left ventricular hypertrophy. Left ventricular diastolic parameters are indeterminate. There is the interventricular septum is flattened in systole and diastole, consistent with right ventricular pressure and volume overload.  2. Right ventricular systolic function is moderately reduced. The right ventricular size is mildly enlarged with prominent apical trabeculation. There is moderately elevated pulmonary artery systolic pressure. The estimated right ventricular systolic pressure is 58.0 mmHg.  3. Left atrial size was  mildly dilated.  4. Right atrial size was mildly dilated.  5. The mitral valve is degenerative. Mild mitral valve regurgitation.  6. Tricuspid valve  regurgitation is moderate to severe.  7. The aortic valve was not well visualized. There is moderate calcification of the aortic valve. Aortic valve regurgitation is not visualized. Aortic valve sclerosis/calcification is present, without any evidence of aortic stenosis.  8. The inferior vena cava is dilated in size with <50% respiratory variability, suggesting right atrial pressure of 15 mmHg. Comparison(s): Prior images unable to be directly viewed. FINDINGS  Left Ventricle: Left ventricular ejection fraction, by estimation, is 55 to 60%. The left ventricle has normal function. The left ventricle has no regional wall motion abnormalities. The left ventricular internal cavity size was mildly dilated. There is  mild concentric left ventricular hypertrophy. The interventricular septum is flattened in systole and diastole, consistent with right ventricular pressure and volume overload. Left ventricular diastolic function could not be evaluated due to atrial fibrillation. Left ventricular diastolic parameters are indeterminate. Right Ventricle: The right ventricular size is mildly enlarged. No increase in right ventricular wall thickness. Right ventricular systolic function is moderately reduced. There is moderately elevated pulmonary artery systolic pressure. The tricuspid regurgitant velocity is 3.28 m/s, and with an assumed right atrial pressure of 15 mmHg, the estimated right ventricular systolic pressure is 58.0 mmHg. Left Atrium: Left atrial size was mildly dilated. Right Atrium: Right atrial size was mildly dilated. Pericardium: There is no evidence of pericardial effusion. Presence of epicardial fat layer. Mitral Valve: The mitral valve is degenerative in appearance. Mild mitral annular calcification. Mild mitral valve regurgitation, with posteriorly-directed jet.  Tricuspid Valve: The tricuspid valve is grossly normal. Tricuspid valve regurgitation is moderate to severe. Aortic Valve: The aortic valve was not well visualized. There is moderate calcification of the aortic valve. There is moderate aortic valve annular calcification. Aortic valve regurgitation is not visualized. Aortic valve sclerosis/calcification is present, without any evidence of aortic stenosis. Pulmonic Valve: The pulmonic valve was grossly normal. Pulmonic valve regurgitation is trivial. Aorta: The aortic root is normal in size and structure. Venous: The inferior vena cava is dilated in size with less than 50% respiratory variability, suggesting right atrial pressure of 15 mmHg. IAS/Shunts: No atrial level shunt detected by color flow Doppler. Additional Comments: 3D was performed not requiring image post processing on an independent workstation and was indeterminate.  LEFT VENTRICLE PLAX 2D LVIDd:         6.30 cm   Diastology LVIDs:         4.30 cm   LV e' medial:  12.00 cm/s LV PW:         1.10 cm   LV e' lateral: 14.10 cm/s LV IVS:        1.00 cm LVOT diam:     2.10 cm LV SV:         59 LV SV Index:   21 LVOT Area:     3.46 cm  RIGHT VENTRICLE RV Basal diam:  5.85 cm RV S prime:     14.00 cm/s TAPSE (M-mode): 1.5 cm LEFT ATRIUM            Index        RIGHT ATRIUM           Index LA diam:      6.80 cm  2.41 cm/m   RA Area:     30.10 cm LA Vol (A4C): 102.0 ml 36.19 ml/m  RA Volume:   90.10 ml  31.97 ml/m  AORTIC VALVE LVOT Vmax:   89.70 cm/s LVOT Vmean:  56.900 cm/s LVOT VTI:    0.171  m  AORTA Ao Root diam: 3.70 cm TRICUSPID VALVE TR Peak grad:   43.0 mmHg TR Vmax:        328.00 cm/s  SHUNTS Systemic VTI:  0.17 m Systemic Diam: 2.10 cm Jayson Sierras MD Electronically signed by Jayson Sierras MD Signature Date/Time: 09/10/2023/2:52:12 PM    Final    DG Chest Port 1 View Result Date: 09/08/2023 CLINICAL DATA:  sob EXAM: PORTABLE CHEST - 1 VIEW COMPARISON:  None available. FINDINGS: Diffuse  bilateral perihilar interstitial opacities. Blunting of the right costophrenic angle with hazy airspace attenuation, likely reflecting a small effusion and atelectasis. Trace left pleural effusion. Cardiomegaly. Tortuous aorta with aortic atherosclerosis. No acute fracture or destructive lesions. Multilevel thoracic osteophytosis. IMPRESSION: Cardiomegaly with findings of interstitial edema. Small right and trace left pleural effusions. Electronically Signed   By: Rogelia Myers M.D.   On: 09/08/2023 18:02    Microbiology: Results for orders placed or performed during the hospital encounter of 02/22/15  Stone analysis     Status: None   Collection Time: 02/22/15 11:50 AM  Result Value Ref Range Status   Composition Comment  Corrected    Comment: Percentage (Represents the % composition)   Nidus No Nidus visualized  Corrected   Stone Weight KSTONE 11.2 mg Final   Color Tan  Final   Size Comment mm Final    Comment: Specimen received as fragments.   Ca Oxalate,Monohydr. 10 % Corrected   Ca phos cry stone ql IR 90 % Corrected   Photo Comment  Corrected    Comment: Photograph will follow under separate cover.   Comment: Comment  Corrected    Comment: (NOTE) Physician questions regarding Calculi Analysis contact [facility] at: [phone].    PLEASE NOTE: Comment  Corrected    Comment: (NOTE) Calculi report with photograph will follow via computer, mail or courier delivery. Performed at [facility]     Labs: CBC: Recent Labs  Lab 09/22/23 0304 09/23/23 0432 09/24/23 0440  WBC 7.2 6.3 6.9  HGB 8.3* 8.3* 7.8*  HCT 33.1* 33.9* 30.7*  MCV 88.7 90.6 88.0  PLT 252 266 275   Basic Metabolic Panel: Recent Labs  Lab 09/22/23 0304 09/23/23 0432 09/24/23 0440 09/25/23 0420 09/26/23 0400 09/27/23 0741  NA 142 144 141 140 139 139  K 3.9 4.1 3.3* 3.3* 3.5 3.3*  CL 98 97* 91* 86* 88* 88*  CO2 35* 37* 40* 42* 38* 36*  GLUCOSE 99 109* 104* 96 102* 110*  BUN 29*  26* 28* 30* 34* 39*  CREATININE 1.12 1.12 1.15 1.03 1.21 1.20  CALCIUM  9.9 9.7 9.7 10.4* 10.6* 10.8*  MG 3.0*  --  2.8* 2.6*  --  2.7*   Liver Function Tests: No results for input(s): AST, ALT, ALKPHOS, BILITOT, PROT, ALBUMIN in the last 168 hours. CBG: No results for input(s): GLUCAP in the last 168 hours.  Discharge time spent: greater than 30 minutes.  Signed: Alm Schneider, MD [facility] 09/28/2023

## 2023-09-28 NOTE — Progress Notes (Signed)
   09/28/23 0846  TOC Barriers to Discharge  Barriers to Discharge SNF Pending transportation ([facility] is not running a convalescent truck this weekend. They are transporting pts in between EMS calls. Spoke c/dispatcher this morning to verify pt is still on list from yesterday.)

## 2023-09-29 DIAGNOSIS — I4891 Unspecified atrial fibrillation: Secondary | ICD-10-CM | POA: Diagnosis not present

## 2023-09-29 DIAGNOSIS — I5033 Acute on chronic diastolic (congestive) heart failure: Secondary | ICD-10-CM | POA: Diagnosis not present

## 2023-09-29 DIAGNOSIS — E8770 Fluid overload, unspecified: Secondary | ICD-10-CM | POA: Diagnosis not present

## 2023-10-01 DIAGNOSIS — I10 Essential (primary) hypertension: Secondary | ICD-10-CM | POA: Diagnosis not present

## 2023-10-01 DIAGNOSIS — I5033 Acute on chronic diastolic (congestive) heart failure: Secondary | ICD-10-CM | POA: Diagnosis not present

## 2023-10-01 DIAGNOSIS — E8770 Fluid overload, unspecified: Secondary | ICD-10-CM | POA: Diagnosis not present

## 2023-10-02 DIAGNOSIS — F4321 Adjustment disorder with depressed mood: Secondary | ICD-10-CM | POA: Diagnosis not present

## 2023-10-06 DIAGNOSIS — F4322 Adjustment disorder with anxiety: Secondary | ICD-10-CM | POA: Diagnosis not present

## 2023-10-13 DIAGNOSIS — F4322 Adjustment disorder with anxiety: Secondary | ICD-10-CM | POA: Diagnosis not present

## 2023-10-20 DIAGNOSIS — F4322 Adjustment disorder with anxiety: Secondary | ICD-10-CM | POA: Diagnosis not present

## 2023-10-23 DIAGNOSIS — E8770 Fluid overload, unspecified: Secondary | ICD-10-CM | POA: Diagnosis not present

## 2023-10-23 DIAGNOSIS — J9600 Acute respiratory failure, unspecified whether with hypoxia or hypercapnia: Secondary | ICD-10-CM | POA: Diagnosis not present

## 2023-10-30 DIAGNOSIS — I4891 Unspecified atrial fibrillation: Secondary | ICD-10-CM | POA: Diagnosis not present

## 2023-10-30 DIAGNOSIS — E8770 Fluid overload, unspecified: Secondary | ICD-10-CM | POA: Diagnosis not present

## 2023-10-30 DIAGNOSIS — J189 Pneumonia, unspecified organism: Secondary | ICD-10-CM | POA: Diagnosis not present

## 2023-10-30 DIAGNOSIS — I5032 Chronic diastolic (congestive) heart failure: Secondary | ICD-10-CM | POA: Diagnosis not present

## 2023-10-31 DIAGNOSIS — D638 Anemia in other chronic diseases classified elsewhere: Secondary | ICD-10-CM | POA: Diagnosis not present

## 2023-10-31 DIAGNOSIS — K5909 Other constipation: Secondary | ICD-10-CM | POA: Diagnosis not present

## 2023-10-31 DIAGNOSIS — I272 Pulmonary hypertension, unspecified: Secondary | ICD-10-CM | POA: Diagnosis not present

## 2023-10-31 DIAGNOSIS — K219 Gastro-esophageal reflux disease without esophagitis: Secondary | ICD-10-CM | POA: Diagnosis not present

## 2023-10-31 DIAGNOSIS — G4709 Other insomnia: Secondary | ICD-10-CM | POA: Diagnosis not present

## 2023-10-31 DIAGNOSIS — F4323 Adjustment disorder with mixed anxiety and depressed mood: Secondary | ICD-10-CM | POA: Diagnosis not present

## 2023-10-31 DIAGNOSIS — E876 Hypokalemia: Secondary | ICD-10-CM | POA: Diagnosis not present

## 2023-10-31 DIAGNOSIS — Z741 Need for assistance with personal care: Secondary | ICD-10-CM | POA: Diagnosis not present

## 2023-10-31 DIAGNOSIS — F4321 Adjustment disorder with depressed mood: Secondary | ICD-10-CM | POA: Diagnosis not present

## 2023-10-31 DIAGNOSIS — I4891 Unspecified atrial fibrillation: Secondary | ICD-10-CM | POA: Diagnosis not present

## 2023-10-31 DIAGNOSIS — R278 Other lack of coordination: Secondary | ICD-10-CM | POA: Diagnosis not present

## 2023-10-31 DIAGNOSIS — J189 Pneumonia, unspecified organism: Secondary | ICD-10-CM | POA: Diagnosis not present

## 2023-10-31 DIAGNOSIS — Z6841 Body Mass Index (BMI) 40.0 and over, adult: Secondary | ICD-10-CM | POA: Diagnosis not present

## 2023-10-31 DIAGNOSIS — I10 Essential (primary) hypertension: Secondary | ICD-10-CM | POA: Diagnosis not present

## 2023-10-31 DIAGNOSIS — M6281 Muscle weakness (generalized): Secondary | ICD-10-CM | POA: Diagnosis not present

## 2023-10-31 DIAGNOSIS — R059 Cough, unspecified: Secondary | ICD-10-CM | POA: Diagnosis not present

## 2023-10-31 DIAGNOSIS — I5033 Acute on chronic diastolic (congestive) heart failure: Secondary | ICD-10-CM | POA: Diagnosis not present

## 2023-10-31 DIAGNOSIS — F4322 Adjustment disorder with anxiety: Secondary | ICD-10-CM | POA: Diagnosis not present

## 2023-10-31 DIAGNOSIS — E8770 Fluid overload, unspecified: Secondary | ICD-10-CM | POA: Diagnosis not present

## 2023-10-31 DIAGNOSIS — I482 Chronic atrial fibrillation, unspecified: Secondary | ICD-10-CM | POA: Diagnosis not present

## 2023-10-31 DIAGNOSIS — I5032 Chronic diastolic (congestive) heart failure: Secondary | ICD-10-CM | POA: Diagnosis not present

## 2023-10-31 DIAGNOSIS — J9600 Acute respiratory failure, unspecified whether with hypoxia or hypercapnia: Secondary | ICD-10-CM | POA: Diagnosis not present

## 2023-11-03 ENCOUNTER — Encounter: Payer: Self-pay | Admitting: Nurse Practitioner

## 2023-11-03 ENCOUNTER — Ambulatory Visit: Attending: Nurse Practitioner | Admitting: Nurse Practitioner

## 2023-11-03 VITALS — BP 126/70 | HR 76 | Ht 70.0 in | Wt 330.0 lb

## 2023-11-03 DIAGNOSIS — I10 Essential (primary) hypertension: Secondary | ICD-10-CM | POA: Diagnosis not present

## 2023-11-03 DIAGNOSIS — E785 Hyperlipidemia, unspecified: Secondary | ICD-10-CM

## 2023-11-03 DIAGNOSIS — I272 Pulmonary hypertension, unspecified: Secondary | ICD-10-CM | POA: Diagnosis not present

## 2023-11-03 DIAGNOSIS — I5033 Acute on chronic diastolic (congestive) heart failure: Secondary | ICD-10-CM

## 2023-11-03 DIAGNOSIS — I5032 Chronic diastolic (congestive) heart failure: Secondary | ICD-10-CM

## 2023-11-03 DIAGNOSIS — G4709 Other insomnia: Secondary | ICD-10-CM | POA: Diagnosis not present

## 2023-11-03 DIAGNOSIS — F1010 Alcohol abuse, uncomplicated: Secondary | ICD-10-CM

## 2023-11-03 DIAGNOSIS — E876 Hypokalemia: Secondary | ICD-10-CM

## 2023-11-03 DIAGNOSIS — F4323 Adjustment disorder with mixed anxiety and depressed mood: Secondary | ICD-10-CM | POA: Diagnosis not present

## 2023-11-03 DIAGNOSIS — I482 Chronic atrial fibrillation, unspecified: Secondary | ICD-10-CM | POA: Diagnosis not present

## 2023-11-03 DIAGNOSIS — F4321 Adjustment disorder with depressed mood: Secondary | ICD-10-CM | POA: Diagnosis not present

## 2023-11-03 MED ORDER — SPIRONOLACTONE 25 MG PO TABS: 12.5000 mg | ORAL_TABLET | Freq: Every day | ORAL | 1 refills | Status: AC

## 2023-11-03 NOTE — Patient Instructions (Addendum)
 Medication Instructions:  Your physician has recommended you make the following change in your medication:  Please start Spironolactone  12.5 Mg daily   Labwork: In 1-2 weeks with [facility]   Testing/Procedures: None   Follow-Up: Your physician recommends that you schedule a follow-up appointment in: 6-8 weeks   Any Other Special Instructions Will Be Listed Below (If Applicable).  If you need a refill on your cardiac medications before your next appointment, please call your pharmacy.

## 2023-11-03 NOTE — Progress Notes (Unsigned)
 Cardiology Office Note   Date:  11/03/2023 ID:  Frederick Brown, DOB 1950/08/27, MRN [id number] PCP: [name], MD  [facility] Providers Cardiologist:  Maude Emmer, MD     History of Present Illness Frederick Brown is a 73 y.o. male with a PMH of HFpEF, chronic A-fib, HTN, OSA (intolerant to CPAP), history of alcohol abuse, anemia, history of gout, edema, HLD, and morbid obesity and obesity hypoventilation syndrome, who presents today for hospital follow-up.   Last seen by Dr. Emmer on May 17, 2021. Was overall doing well at the time. Mixing vodka and OJ and drinking 2-3 of these per day.   05/2022 - he had a proximal fibular fracture and a tibial plateau fracture on left side. Was not a surgical candidate, was candidate for knee immobilizer and nonweightbearing. Was placed in nursing home.   Hospitalized in June 2025 due to acute on chronic CHF and acute respiratory failure.  Did require IV Lasix  continuous infusion.  Cardiology was consulted.  Metolazone  was given intermittently, diuresed well.  Echocardiogram revealed EF 55 to 60%, no wall motion abnormalities, IV septum flattening.  Received IV iron  and supplementation while in hospital.  Today presents for hospital follow-up.  He states he is doing well.  Tolerating his medications well. Denies any chest pain, shortness of breath, palpitations, syncope, presyncope, dizziness, orthopnea, PND, swelling or significant weight changes, acute bleeding, or claudication. Continues to work with therapy at nursing facility.  Continues to drink 2-3 large vodka drinks each evening.  ROS: Negative. See HPI.   Studies Reviewed      EKG: EKG is not ordered today.   Echo 08/2023:  1. Left ventricular ejection fraction, by estimation, is 55 to 60%. The  left ventricle has normal function. The left ventricle has no regional  wall motion abnormalities. The left ventricular internal cavity size was  mildly dilated. There is mild   concentric left ventricular hypertrophy. Left ventricular diastolic  parameters are indeterminate. There is the interventricular septum is  flattened in systole and diastole, consistent with right ventricular  pressure and volume overload.   2. Right ventricular systolic function is moderately reduced. The right  ventricular size is mildly enlarged with prominent apical trabeculation.  There is moderately elevated pulmonary artery systolic pressure. The  estimated right ventricular systolic  pressure is 58.0 mmHg.   3. Left atrial size was mildly dilated.   4. Right atrial size was mildly dilated.   5. The mitral valve is degenerative. Mild mitral valve regurgitation.   6. Tricuspid valve regurgitation is moderate to severe.   7. The aortic valve was not well visualized. There is moderate  calcification of the aortic valve. Aortic valve regurgitation is not  visualized. Aortic valve sclerosis/calcification is present, without any  evidence of aortic stenosis.   8. The inferior vena cava is dilated in size with <50% respiratory  variability, suggesting right atrial pressure of 15 mmHg.   Comparison(s): Prior images unable to be directly viewed.      Physical Exam VS:  BP 126/70   Pulse 76   Ht 5' 10 (1.778 m)   Wt (!) 330 lb (149.7 kg)   SpO2 96%   BMI 47.35 kg/m        Wt Readings from Last 3 Encounters:  11/03/23 (!) 330 lb (149.7 kg)  09/28/23 285 lb 11.5 oz (129.6 kg)  05/23/22 (!) 366 lb (166 kg)    GEN: Morbidly obese, 73 year old male in no acute distress  NECK: No JVD; No carotid bruits CARDIAC: S1/S2, irregularly irregular rhythm, no murmurs, rubs, gallops RESPIRATORY:  Clear to auscultation without rales, wheezing or rhonchi  ABDOMEN: Soft, non-tender, non-distended EXTREMITIES:  No edema; No deformity   ASSESSMENT AND PLAN  HFpEF, pulmonary hypertension Stage C, NYHA class I-II symptoms.  EF 55 to 60%, mild LVH, indeterminate left ventricular diastolic  parameters, findings consistent with right ventricular pressure and volume overload with moderately elevated PASP, estimated right ventricular systolic pressure at 58.0 mmHg.  WHO group 2/3.  Has history of OSA and intolerant to CPAP.  Also has history of HFpEF.  Currently tolerating medications well.  He was diuresed well in the hospital.  Continue Farxiga , Lopressor , and torsemide  with potassium supplement.  Because of his hypokalemia, will begin Aldactone  12.5 mg daily and recheck BMET in 1-2 weeks. Low sodium diet, fluid restriction <2L, and daily weights encouraged. Educated to contact our office for weight gain of 2 lbs overnight or 5 lbs in one week.  Chronic A-fib Denies any tachycardia or palpitations.  Heart rate is well-controlled today.  Continue Lopressor .  Continue Xarelto  for stroke prevention and he is on appropriate dosage and denies any bleeding issues.  Did discuss avoiding triggers to A-fib.  HTN Blood pressure is well-controlled.  Will initiate low-dose Aldactone  as noted above.  Will recheck BMET in 1 to 2 weeks.  No other medication changes at this time. Discussed to monitor BP at home at least 2 hours after medications and sitting for 5-10 minutes. Heart healthy diet and regular cardiovascular exercise encouraged.   Alcohol abuse Did discuss risk of alcohol abuse and discussed importance of weaning self off alcohol.  Continue current medication regimen.  Recommended to discuss further with PCP for further management.  HLD LDL from last fall was 47.  He is at goal.  Continue rosuvastatin . Heart healthy diet and regular cardiovascular exercise encouraged.   Morbid obesity Weight loss via diet and exercise encouraged. Discussed the impact being overweight would have on cardiovascular risk.  7.  Hypokalemia Most recent potassium level was 3.3.  Starting Aldactone  as noted above and will recheck BMET in 1 to 2 weeks.   Dispo: Follow-up with MD/APP in 6 to 8 weeks or sooner if  any changes.  Signed, Almarie Crate, NP

## 2023-11-04 DIAGNOSIS — I4891 Unspecified atrial fibrillation: Secondary | ICD-10-CM | POA: Diagnosis not present

## 2023-11-04 DIAGNOSIS — I5032 Chronic diastolic (congestive) heart failure: Secondary | ICD-10-CM | POA: Diagnosis not present

## 2023-11-04 DIAGNOSIS — I10 Essential (primary) hypertension: Secondary | ICD-10-CM | POA: Diagnosis not present

## 2023-11-04 DIAGNOSIS — D638 Anemia in other chronic diseases classified elsewhere: Secondary | ICD-10-CM | POA: Diagnosis not present

## 2023-11-05 DIAGNOSIS — I4891 Unspecified atrial fibrillation: Secondary | ICD-10-CM | POA: Diagnosis not present

## 2023-11-05 DIAGNOSIS — E8770 Fluid overload, unspecified: Secondary | ICD-10-CM | POA: Diagnosis not present

## 2023-11-05 DIAGNOSIS — I5033 Acute on chronic diastolic (congestive) heart failure: Secondary | ICD-10-CM | POA: Diagnosis not present

## 2023-11-05 DIAGNOSIS — E876 Hypokalemia: Secondary | ICD-10-CM | POA: Diagnosis not present

## 2023-11-10 DIAGNOSIS — E8770 Fluid overload, unspecified: Secondary | ICD-10-CM | POA: Diagnosis not present

## 2023-11-10 DIAGNOSIS — K5909 Other constipation: Secondary | ICD-10-CM | POA: Diagnosis not present

## 2023-11-10 DIAGNOSIS — K219 Gastro-esophageal reflux disease without esophagitis: Secondary | ICD-10-CM | POA: Diagnosis not present

## 2023-11-13 DIAGNOSIS — K5909 Other constipation: Secondary | ICD-10-CM | POA: Diagnosis not present

## 2023-11-13 DIAGNOSIS — E8770 Fluid overload, unspecified: Secondary | ICD-10-CM | POA: Diagnosis not present

## 2023-11-13 DIAGNOSIS — K219 Gastro-esophageal reflux disease without esophagitis: Secondary | ICD-10-CM | POA: Diagnosis not present

## 2023-11-14 DIAGNOSIS — R059 Cough, unspecified: Secondary | ICD-10-CM | POA: Diagnosis not present

## 2023-11-17 DIAGNOSIS — E8770 Fluid overload, unspecified: Secondary | ICD-10-CM | POA: Diagnosis not present

## 2023-11-17 DIAGNOSIS — K5909 Other constipation: Secondary | ICD-10-CM | POA: Diagnosis not present

## 2023-11-17 DIAGNOSIS — K219 Gastro-esophageal reflux disease without esophagitis: Secondary | ICD-10-CM | POA: Diagnosis not present

## 2023-11-19 DIAGNOSIS — I5032 Chronic diastolic (congestive) heart failure: Secondary | ICD-10-CM | POA: Diagnosis not present

## 2023-11-19 DIAGNOSIS — E8770 Fluid overload, unspecified: Secondary | ICD-10-CM | POA: Diagnosis not present

## 2023-11-19 DIAGNOSIS — J189 Pneumonia, unspecified organism: Secondary | ICD-10-CM | POA: Diagnosis not present

## 2023-11-19 DIAGNOSIS — I4891 Unspecified atrial fibrillation: Secondary | ICD-10-CM | POA: Diagnosis not present

## 2023-11-20 DIAGNOSIS — I5032 Chronic diastolic (congestive) heart failure: Secondary | ICD-10-CM | POA: Diagnosis not present

## 2023-11-20 DIAGNOSIS — J9600 Acute respiratory failure, unspecified whether with hypoxia or hypercapnia: Secondary | ICD-10-CM | POA: Diagnosis not present

## 2023-11-20 DIAGNOSIS — E8770 Fluid overload, unspecified: Secondary | ICD-10-CM | POA: Diagnosis not present

## 2023-11-26 DIAGNOSIS — K219 Gastro-esophageal reflux disease without esophagitis: Secondary | ICD-10-CM | POA: Diagnosis not present

## 2023-11-26 DIAGNOSIS — E8770 Fluid overload, unspecified: Secondary | ICD-10-CM | POA: Diagnosis not present

## 2023-11-26 DIAGNOSIS — K5909 Other constipation: Secondary | ICD-10-CM | POA: Diagnosis not present

## 2023-11-27 DIAGNOSIS — E8770 Fluid overload, unspecified: Secondary | ICD-10-CM | POA: Diagnosis not present

## 2023-11-27 DIAGNOSIS — E876 Hypokalemia: Secondary | ICD-10-CM | POA: Diagnosis not present

## 2023-11-27 DIAGNOSIS — I5032 Chronic diastolic (congestive) heart failure: Secondary | ICD-10-CM | POA: Diagnosis not present

## 2023-12-03 DIAGNOSIS — I5032 Chronic diastolic (congestive) heart failure: Secondary | ICD-10-CM | POA: Diagnosis not present

## 2023-12-03 DIAGNOSIS — E8770 Fluid overload, unspecified: Secondary | ICD-10-CM | POA: Diagnosis not present

## 2023-12-03 DIAGNOSIS — E876 Hypokalemia: Secondary | ICD-10-CM | POA: Diagnosis not present

## 2023-12-05 DIAGNOSIS — I5032 Chronic diastolic (congestive) heart failure: Secondary | ICD-10-CM | POA: Diagnosis not present

## 2023-12-05 DIAGNOSIS — E8770 Fluid overload, unspecified: Secondary | ICD-10-CM | POA: Diagnosis not present

## 2023-12-05 DIAGNOSIS — E876 Hypokalemia: Secondary | ICD-10-CM | POA: Diagnosis not present

## 2023-12-09 DIAGNOSIS — Z7984 Long term (current) use of oral hypoglycemic drugs: Secondary | ICD-10-CM | POA: Diagnosis not present

## 2023-12-09 DIAGNOSIS — I5032 Chronic diastolic (congestive) heart failure: Secondary | ICD-10-CM | POA: Diagnosis not present

## 2023-12-09 DIAGNOSIS — M1990 Unspecified osteoarthritis, unspecified site: Secondary | ICD-10-CM | POA: Diagnosis not present

## 2023-12-09 DIAGNOSIS — E662 Morbid (severe) obesity with alveolar hypoventilation: Secondary | ICD-10-CM | POA: Diagnosis not present

## 2023-12-09 DIAGNOSIS — I480 Paroxysmal atrial fibrillation: Secondary | ICD-10-CM | POA: Diagnosis not present

## 2023-12-09 DIAGNOSIS — Z6841 Body Mass Index (BMI) 40.0 and over, adult: Secondary | ICD-10-CM | POA: Diagnosis not present

## 2023-12-09 DIAGNOSIS — Z7901 Long term (current) use of anticoagulants: Secondary | ICD-10-CM | POA: Diagnosis not present

## 2023-12-09 DIAGNOSIS — I110 Hypertensive heart disease with heart failure: Secondary | ICD-10-CM | POA: Diagnosis not present

## 2023-12-09 DIAGNOSIS — Z9181 History of falling: Secondary | ICD-10-CM | POA: Diagnosis not present

## 2023-12-09 DIAGNOSIS — D649 Anemia, unspecified: Secondary | ICD-10-CM | POA: Diagnosis not present

## 2023-12-09 DIAGNOSIS — M109 Gout, unspecified: Secondary | ICD-10-CM | POA: Diagnosis not present

## 2023-12-10 DIAGNOSIS — Z9181 History of falling: Secondary | ICD-10-CM | POA: Diagnosis not present

## 2023-12-10 DIAGNOSIS — Z7984 Long term (current) use of oral hypoglycemic drugs: Secondary | ICD-10-CM | POA: Diagnosis not present

## 2023-12-10 DIAGNOSIS — I110 Hypertensive heart disease with heart failure: Secondary | ICD-10-CM | POA: Diagnosis not present

## 2023-12-10 DIAGNOSIS — Z6841 Body Mass Index (BMI) 40.0 and over, adult: Secondary | ICD-10-CM | POA: Diagnosis not present

## 2023-12-10 DIAGNOSIS — I5032 Chronic diastolic (congestive) heart failure: Secondary | ICD-10-CM | POA: Diagnosis not present

## 2023-12-10 DIAGNOSIS — Z7901 Long term (current) use of anticoagulants: Secondary | ICD-10-CM | POA: Diagnosis not present

## 2023-12-10 DIAGNOSIS — E662 Morbid (severe) obesity with alveolar hypoventilation: Secondary | ICD-10-CM | POA: Diagnosis not present

## 2023-12-10 DIAGNOSIS — I480 Paroxysmal atrial fibrillation: Secondary | ICD-10-CM | POA: Diagnosis not present

## 2023-12-10 DIAGNOSIS — M109 Gout, unspecified: Secondary | ICD-10-CM | POA: Diagnosis not present

## 2023-12-10 DIAGNOSIS — D649 Anemia, unspecified: Secondary | ICD-10-CM | POA: Diagnosis not present

## 2023-12-10 DIAGNOSIS — M1990 Unspecified osteoarthritis, unspecified site: Secondary | ICD-10-CM | POA: Diagnosis not present

## 2023-12-11 DIAGNOSIS — Z7984 Long term (current) use of oral hypoglycemic drugs: Secondary | ICD-10-CM | POA: Diagnosis not present

## 2023-12-11 DIAGNOSIS — I110 Hypertensive heart disease with heart failure: Secondary | ICD-10-CM | POA: Diagnosis not present

## 2023-12-11 DIAGNOSIS — M1990 Unspecified osteoarthritis, unspecified site: Secondary | ICD-10-CM | POA: Diagnosis not present

## 2023-12-11 DIAGNOSIS — I5032 Chronic diastolic (congestive) heart failure: Secondary | ICD-10-CM | POA: Diagnosis not present

## 2023-12-11 DIAGNOSIS — D649 Anemia, unspecified: Secondary | ICD-10-CM | POA: Diagnosis not present

## 2023-12-11 DIAGNOSIS — Z7901 Long term (current) use of anticoagulants: Secondary | ICD-10-CM | POA: Diagnosis not present

## 2023-12-11 DIAGNOSIS — E662 Morbid (severe) obesity with alveolar hypoventilation: Secondary | ICD-10-CM | POA: Diagnosis not present

## 2023-12-11 DIAGNOSIS — M109 Gout, unspecified: Secondary | ICD-10-CM | POA: Diagnosis not present

## 2023-12-11 DIAGNOSIS — Z6841 Body Mass Index (BMI) 40.0 and over, adult: Secondary | ICD-10-CM | POA: Diagnosis not present

## 2023-12-11 DIAGNOSIS — I480 Paroxysmal atrial fibrillation: Secondary | ICD-10-CM | POA: Diagnosis not present

## 2023-12-11 DIAGNOSIS — Z9181 History of falling: Secondary | ICD-10-CM | POA: Diagnosis not present

## 2023-12-23 DIAGNOSIS — I110 Hypertensive heart disease with heart failure: Secondary | ICD-10-CM | POA: Diagnosis not present

## 2023-12-24 DIAGNOSIS — Z7901 Long term (current) use of anticoagulants: Secondary | ICD-10-CM | POA: Diagnosis not present

## 2023-12-24 DIAGNOSIS — M1990 Unspecified osteoarthritis, unspecified site: Secondary | ICD-10-CM | POA: Diagnosis not present

## 2023-12-24 DIAGNOSIS — M109 Gout, unspecified: Secondary | ICD-10-CM | POA: Diagnosis not present

## 2023-12-24 DIAGNOSIS — E662 Morbid (severe) obesity with alveolar hypoventilation: Secondary | ICD-10-CM | POA: Diagnosis not present

## 2023-12-24 DIAGNOSIS — D649 Anemia, unspecified: Secondary | ICD-10-CM | POA: Diagnosis not present

## 2023-12-24 DIAGNOSIS — Z9181 History of falling: Secondary | ICD-10-CM | POA: Diagnosis not present

## 2023-12-24 DIAGNOSIS — Z6841 Body Mass Index (BMI) 40.0 and over, adult: Secondary | ICD-10-CM | POA: Diagnosis not present

## 2023-12-24 DIAGNOSIS — Z7984 Long term (current) use of oral hypoglycemic drugs: Secondary | ICD-10-CM | POA: Diagnosis not present

## 2023-12-24 DIAGNOSIS — I480 Paroxysmal atrial fibrillation: Secondary | ICD-10-CM | POA: Diagnosis not present

## 2023-12-24 DIAGNOSIS — I5032 Chronic diastolic (congestive) heart failure: Secondary | ICD-10-CM | POA: Diagnosis not present

## 2023-12-24 DIAGNOSIS — I110 Hypertensive heart disease with heart failure: Secondary | ICD-10-CM | POA: Diagnosis not present

## 2023-12-26 DIAGNOSIS — Z6841 Body Mass Index (BMI) 40.0 and over, adult: Secondary | ICD-10-CM | POA: Diagnosis not present

## 2023-12-26 DIAGNOSIS — M109 Gout, unspecified: Secondary | ICD-10-CM | POA: Diagnosis not present

## 2023-12-26 DIAGNOSIS — Z7901 Long term (current) use of anticoagulants: Secondary | ICD-10-CM | POA: Diagnosis not present

## 2023-12-26 DIAGNOSIS — I110 Hypertensive heart disease with heart failure: Secondary | ICD-10-CM | POA: Diagnosis not present

## 2023-12-26 DIAGNOSIS — I480 Paroxysmal atrial fibrillation: Secondary | ICD-10-CM | POA: Diagnosis not present

## 2023-12-26 DIAGNOSIS — Z9181 History of falling: Secondary | ICD-10-CM | POA: Diagnosis not present

## 2023-12-26 DIAGNOSIS — Z7984 Long term (current) use of oral hypoglycemic drugs: Secondary | ICD-10-CM | POA: Diagnosis not present

## 2023-12-26 DIAGNOSIS — I5032 Chronic diastolic (congestive) heart failure: Secondary | ICD-10-CM | POA: Diagnosis not present

## 2023-12-26 DIAGNOSIS — D649 Anemia, unspecified: Secondary | ICD-10-CM | POA: Diagnosis not present

## 2023-12-26 DIAGNOSIS — M1990 Unspecified osteoarthritis, unspecified site: Secondary | ICD-10-CM | POA: Diagnosis not present

## 2023-12-26 DIAGNOSIS — E662 Morbid (severe) obesity with alveolar hypoventilation: Secondary | ICD-10-CM | POA: Diagnosis not present

## 2023-12-29 ENCOUNTER — Ambulatory Visit: Admitting: Nurse Practitioner

## 2023-12-30 DIAGNOSIS — Z7984 Long term (current) use of oral hypoglycemic drugs: Secondary | ICD-10-CM | POA: Diagnosis not present

## 2023-12-30 DIAGNOSIS — Z6841 Body Mass Index (BMI) 40.0 and over, adult: Secondary | ICD-10-CM | POA: Diagnosis not present

## 2023-12-30 DIAGNOSIS — Z7901 Long term (current) use of anticoagulants: Secondary | ICD-10-CM | POA: Diagnosis not present

## 2023-12-30 DIAGNOSIS — I110 Hypertensive heart disease with heart failure: Secondary | ICD-10-CM | POA: Diagnosis not present

## 2023-12-30 DIAGNOSIS — Z9181 History of falling: Secondary | ICD-10-CM | POA: Diagnosis not present

## 2023-12-30 DIAGNOSIS — M109 Gout, unspecified: Secondary | ICD-10-CM | POA: Diagnosis not present

## 2023-12-30 DIAGNOSIS — I5032 Chronic diastolic (congestive) heart failure: Secondary | ICD-10-CM | POA: Diagnosis not present

## 2023-12-30 DIAGNOSIS — I480 Paroxysmal atrial fibrillation: Secondary | ICD-10-CM | POA: Diagnosis not present

## 2023-12-30 DIAGNOSIS — M1990 Unspecified osteoarthritis, unspecified site: Secondary | ICD-10-CM | POA: Diagnosis not present

## 2023-12-30 DIAGNOSIS — E662 Morbid (severe) obesity with alveolar hypoventilation: Secondary | ICD-10-CM | POA: Diagnosis not present

## 2023-12-30 DIAGNOSIS — D649 Anemia, unspecified: Secondary | ICD-10-CM | POA: Diagnosis not present

## 2024-01-01 DIAGNOSIS — E662 Morbid (severe) obesity with alveolar hypoventilation: Secondary | ICD-10-CM | POA: Diagnosis not present

## 2024-01-01 DIAGNOSIS — I480 Paroxysmal atrial fibrillation: Secondary | ICD-10-CM | POA: Diagnosis not present

## 2024-01-01 DIAGNOSIS — M109 Gout, unspecified: Secondary | ICD-10-CM | POA: Diagnosis not present

## 2024-01-01 DIAGNOSIS — M1990 Unspecified osteoarthritis, unspecified site: Secondary | ICD-10-CM | POA: Diagnosis not present

## 2024-01-01 DIAGNOSIS — Z6841 Body Mass Index (BMI) 40.0 and over, adult: Secondary | ICD-10-CM | POA: Diagnosis not present

## 2024-01-01 DIAGNOSIS — I110 Hypertensive heart disease with heart failure: Secondary | ICD-10-CM | POA: Diagnosis not present

## 2024-01-01 DIAGNOSIS — Z7984 Long term (current) use of oral hypoglycemic drugs: Secondary | ICD-10-CM | POA: Diagnosis not present

## 2024-01-01 DIAGNOSIS — D649 Anemia, unspecified: Secondary | ICD-10-CM | POA: Diagnosis not present

## 2024-01-01 DIAGNOSIS — Z9181 History of falling: Secondary | ICD-10-CM | POA: Diagnosis not present

## 2024-01-01 DIAGNOSIS — I5032 Chronic diastolic (congestive) heart failure: Secondary | ICD-10-CM | POA: Diagnosis not present

## 2024-01-01 DIAGNOSIS — Z7901 Long term (current) use of anticoagulants: Secondary | ICD-10-CM | POA: Diagnosis not present

## 2024-01-05 DIAGNOSIS — Z6841 Body Mass Index (BMI) 40.0 and over, adult: Secondary | ICD-10-CM | POA: Diagnosis not present

## 2024-01-05 DIAGNOSIS — I5032 Chronic diastolic (congestive) heart failure: Secondary | ICD-10-CM | POA: Diagnosis not present

## 2024-01-05 DIAGNOSIS — M109 Gout, unspecified: Secondary | ICD-10-CM | POA: Diagnosis not present

## 2024-01-05 DIAGNOSIS — Z7901 Long term (current) use of anticoagulants: Secondary | ICD-10-CM | POA: Diagnosis not present

## 2024-01-05 DIAGNOSIS — D649 Anemia, unspecified: Secondary | ICD-10-CM | POA: Diagnosis not present

## 2024-01-05 DIAGNOSIS — Z9181 History of falling: Secondary | ICD-10-CM | POA: Diagnosis not present

## 2024-01-05 DIAGNOSIS — Z7984 Long term (current) use of oral hypoglycemic drugs: Secondary | ICD-10-CM | POA: Diagnosis not present

## 2024-01-05 DIAGNOSIS — M1990 Unspecified osteoarthritis, unspecified site: Secondary | ICD-10-CM | POA: Diagnosis not present

## 2024-01-05 DIAGNOSIS — E662 Morbid (severe) obesity with alveolar hypoventilation: Secondary | ICD-10-CM | POA: Diagnosis not present

## 2024-01-05 DIAGNOSIS — I110 Hypertensive heart disease with heart failure: Secondary | ICD-10-CM | POA: Diagnosis not present

## 2024-01-05 DIAGNOSIS — I480 Paroxysmal atrial fibrillation: Secondary | ICD-10-CM | POA: Diagnosis not present

## 2024-01-06 DIAGNOSIS — Z9181 History of falling: Secondary | ICD-10-CM | POA: Diagnosis not present

## 2024-01-06 DIAGNOSIS — D649 Anemia, unspecified: Secondary | ICD-10-CM | POA: Diagnosis not present

## 2024-01-06 DIAGNOSIS — Z6841 Body Mass Index (BMI) 40.0 and over, adult: Secondary | ICD-10-CM | POA: Diagnosis not present

## 2024-01-06 DIAGNOSIS — I480 Paroxysmal atrial fibrillation: Secondary | ICD-10-CM | POA: Diagnosis not present

## 2024-01-06 DIAGNOSIS — Z7901 Long term (current) use of anticoagulants: Secondary | ICD-10-CM | POA: Diagnosis not present

## 2024-01-06 DIAGNOSIS — I110 Hypertensive heart disease with heart failure: Secondary | ICD-10-CM | POA: Diagnosis not present

## 2024-01-06 DIAGNOSIS — Z7984 Long term (current) use of oral hypoglycemic drugs: Secondary | ICD-10-CM | POA: Diagnosis not present

## 2024-01-06 DIAGNOSIS — E662 Morbid (severe) obesity with alveolar hypoventilation: Secondary | ICD-10-CM | POA: Diagnosis not present

## 2024-01-06 DIAGNOSIS — M1990 Unspecified osteoarthritis, unspecified site: Secondary | ICD-10-CM | POA: Diagnosis not present

## 2024-01-06 DIAGNOSIS — I5032 Chronic diastolic (congestive) heart failure: Secondary | ICD-10-CM | POA: Diagnosis not present

## 2024-01-06 DIAGNOSIS — M109 Gout, unspecified: Secondary | ICD-10-CM | POA: Diagnosis not present

## 2024-01-07 DIAGNOSIS — I480 Paroxysmal atrial fibrillation: Secondary | ICD-10-CM | POA: Diagnosis not present

## 2024-01-07 DIAGNOSIS — I110 Hypertensive heart disease with heart failure: Secondary | ICD-10-CM | POA: Diagnosis not present

## 2024-01-07 DIAGNOSIS — I5032 Chronic diastolic (congestive) heart failure: Secondary | ICD-10-CM | POA: Diagnosis not present

## 2024-01-07 DIAGNOSIS — D649 Anemia, unspecified: Secondary | ICD-10-CM | POA: Diagnosis not present

## 2024-01-07 DIAGNOSIS — Z7984 Long term (current) use of oral hypoglycemic drugs: Secondary | ICD-10-CM | POA: Diagnosis not present

## 2024-01-07 DIAGNOSIS — Z7901 Long term (current) use of anticoagulants: Secondary | ICD-10-CM | POA: Diagnosis not present

## 2024-01-07 DIAGNOSIS — E662 Morbid (severe) obesity with alveolar hypoventilation: Secondary | ICD-10-CM | POA: Diagnosis not present

## 2024-01-07 DIAGNOSIS — M109 Gout, unspecified: Secondary | ICD-10-CM | POA: Diagnosis not present

## 2024-01-07 DIAGNOSIS — M1990 Unspecified osteoarthritis, unspecified site: Secondary | ICD-10-CM | POA: Diagnosis not present

## 2024-01-07 DIAGNOSIS — Z9181 History of falling: Secondary | ICD-10-CM | POA: Diagnosis not present

## 2024-01-07 DIAGNOSIS — Z6841 Body Mass Index (BMI) 40.0 and over, adult: Secondary | ICD-10-CM | POA: Diagnosis not present

## 2024-01-08 ENCOUNTER — Ambulatory Visit: Admitting: Nurse Practitioner

## 2024-01-08 NOTE — Progress Notes (Deleted)
 Cardiology Office Note   Date:  11/03/2023 Frederick Brown, DOB December 01, 1950, MRN [id number] PCP: [name], MD  [facility] Providers Cardiologist:  Maude Emmer, MD     History of Present Illness Frederick Brown is a 73 y.o. male with a PMH of HFpEF, chronic A-fib, HTN, OSA (intolerant to CPAP), history of alcohol abuse, anemia, history of gout, edema, HLD, and morbid obesity and obesity hypoventilation syndrome, who presents today for hospital follow-up.   Last seen by Dr. Emmer on May 17, 2021. Was overall doing well at the time. Mixing vodka and OJ and drinking 2-3 of these per day.   05/2022 - he had a proximal fibular fracture and a tibial plateau fracture on left side. Was not a surgical candidate, was candidate for knee immobilizer and nonweightbearing. Was placed in nursing home.   Hospitalized in June 2025 due to acute on chronic CHF and acute respiratory failure.  Did require IV Lasix  continuous infusion.  Cardiology was consulted.  Metolazone  was given intermittently, diuresed well.  Echocardiogram revealed EF 55 to 60%, no wall motion abnormalities, IV septum flattening.  Received IV iron  and supplementation while in hospital.  Today presents for hospital follow-up.  He states he is doing well.  Tolerating his medications well. Denies any chest pain, shortness of breath, palpitations, syncope, presyncope, dizziness, orthopnea, PND, swelling or significant weight changes, acute bleeding, or claudication. Continues to work with therapy at nursing facility.  Continues to drink 2-3 large vodka drinks each evening.  ROS: Negative. See HPI.   Studies Reviewed      EKG: EKG is not ordered today.   Echo 08/2023:  1. Left ventricular ejection fraction, by estimation, is 55 to 60%. The  left ventricle has normal function. The left ventricle has no regional  wall motion abnormalities. The left ventricular internal cavity size was  mildly dilated. There is mild   concentric left ventricular hypertrophy. Left ventricular diastolic  parameters are indeterminate. There is the interventricular septum is  flattened in systole and diastole, consistent with right ventricular  pressure and volume overload.   2. Right ventricular systolic function is moderately reduced. The right  ventricular size is mildly enlarged with prominent apical trabeculation.  There is moderately elevated pulmonary artery systolic pressure. The  estimated right ventricular systolic  pressure is 58.0 mmHg.   3. Left atrial size was mildly dilated.   4. Right atrial size was mildly dilated.   5. The mitral valve is degenerative. Mild mitral valve regurgitation.   6. Tricuspid valve regurgitation is moderate to severe.   7. The aortic valve was not well visualized. There is moderate  calcification of the aortic valve. Aortic valve regurgitation is not  visualized. Aortic valve sclerosis/calcification is present, without any  evidence of aortic stenosis.   8. The inferior vena cava is dilated in size with <50% respiratory  variability, suggesting right atrial pressure of 15 mmHg.   Comparison(s): Prior images unable to be directly viewed.      Physical Exam VS:  There were no vitals taken for this visit.       Wt Readings from Last 3 Encounters:  11/03/23 (!) 330 lb (149.7 kg)  09/28/23 285 lb 11.5 oz (129.6 kg)  05/23/22 (!) 366 lb (166 kg)    GEN: Morbidly obese, 73 year old male in no acute distress NECK: No JVD; No carotid bruits CARDIAC: S1/S2, irregularly irregular rhythm, no murmurs, rubs, gallops RESPIRATORY:  Clear to auscultation without rales, wheezing  or rhonchi  ABDOMEN: Soft, non-tender, non-distended EXTREMITIES:  No edema; No deformity   ASSESSMENT AND PLAN  HFpEF, pulmonary hypertension Stage C, NYHA class I-II symptoms.  EF 55 to 60%, mild LVH, indeterminate left ventricular diastolic parameters, findings consistent with right ventricular pressure  and volume overload with moderately elevated PASP, estimated right ventricular systolic pressure at 58.0 mmHg.  WHO group 2/3.  Has history of OSA and intolerant to CPAP.  Also has history of HFpEF.  Currently tolerating medications well.  He was diuresed well in the hospital.  Continue Farxiga , Lopressor , and torsemide  with potassium supplement.  Because of his hypokalemia, will begin Aldactone  12.5 mg daily and recheck BMET in 1-2 weeks. Low sodium diet, fluid restriction <2L, and daily weights encouraged. Educated to contact our office for weight gain of 2 lbs overnight or 5 lbs in one week.  Chronic A-fib Denies any tachycardia or palpitations.  Heart rate is well-controlled today.  Continue Lopressor .  Continue Xarelto  for stroke prevention and he is on appropriate dosage and denies any bleeding issues.  Did discuss avoiding triggers to A-fib.  HTN Blood pressure is well-controlled.  Will initiate low-dose Aldactone  as noted above.  Will recheck BMET in 1 to 2 weeks.  No other medication changes at this time. Discussed to monitor BP at home at least 2 hours after medications and sitting for 5-10 minutes. Heart healthy diet and regular cardiovascular exercise encouraged.   Alcohol abuse Did discuss risk of alcohol abuse and discussed importance of weaning self off alcohol.  Continue current medication regimen.  Recommended to discuss further with PCP for further management.  HLD LDL from last fall was 47.  He is at goal.  Continue rosuvastatin . Heart healthy diet and regular cardiovascular exercise encouraged.   Morbid obesity Weight loss via diet and exercise encouraged. Discussed the impact being overweight would have on cardiovascular risk.  7.  Hypokalemia Most recent potassium level was 3.3.  Starting Aldactone  as noted above and will recheck BMET in 1 to 2 weeks.   Dispo: Follow-up with MD/APP in 6 to 8 weeks or sooner if any changes.  Signed, Almarie Crate, NP

## 2024-01-12 DIAGNOSIS — I110 Hypertensive heart disease with heart failure: Secondary | ICD-10-CM | POA: Diagnosis not present

## 2024-01-13 DIAGNOSIS — Z7984 Long term (current) use of oral hypoglycemic drugs: Secondary | ICD-10-CM | POA: Diagnosis not present

## 2024-01-13 DIAGNOSIS — I480 Paroxysmal atrial fibrillation: Secondary | ICD-10-CM | POA: Diagnosis not present

## 2024-01-13 DIAGNOSIS — M1990 Unspecified osteoarthritis, unspecified site: Secondary | ICD-10-CM | POA: Diagnosis not present

## 2024-01-13 DIAGNOSIS — I110 Hypertensive heart disease with heart failure: Secondary | ICD-10-CM | POA: Diagnosis not present

## 2024-01-13 DIAGNOSIS — Z7901 Long term (current) use of anticoagulants: Secondary | ICD-10-CM | POA: Diagnosis not present

## 2024-01-13 DIAGNOSIS — M109 Gout, unspecified: Secondary | ICD-10-CM | POA: Diagnosis not present

## 2024-01-13 DIAGNOSIS — Z6841 Body Mass Index (BMI) 40.0 and over, adult: Secondary | ICD-10-CM | POA: Diagnosis not present

## 2024-01-13 DIAGNOSIS — E662 Morbid (severe) obesity with alveolar hypoventilation: Secondary | ICD-10-CM | POA: Diagnosis not present

## 2024-01-13 DIAGNOSIS — I5032 Chronic diastolic (congestive) heart failure: Secondary | ICD-10-CM | POA: Diagnosis not present

## 2024-01-13 DIAGNOSIS — Z9181 History of falling: Secondary | ICD-10-CM | POA: Diagnosis not present

## 2024-01-13 DIAGNOSIS — D649 Anemia, unspecified: Secondary | ICD-10-CM | POA: Diagnosis not present

## 2024-01-14 DIAGNOSIS — E662 Morbid (severe) obesity with alveolar hypoventilation: Secondary | ICD-10-CM | POA: Diagnosis not present

## 2024-01-14 DIAGNOSIS — I480 Paroxysmal atrial fibrillation: Secondary | ICD-10-CM | POA: Diagnosis not present

## 2024-01-14 DIAGNOSIS — Z6841 Body Mass Index (BMI) 40.0 and over, adult: Secondary | ICD-10-CM | POA: Diagnosis not present

## 2024-01-14 DIAGNOSIS — I5032 Chronic diastolic (congestive) heart failure: Secondary | ICD-10-CM | POA: Diagnosis not present

## 2024-01-14 DIAGNOSIS — M109 Gout, unspecified: Secondary | ICD-10-CM | POA: Diagnosis not present

## 2024-01-14 DIAGNOSIS — I110 Hypertensive heart disease with heart failure: Secondary | ICD-10-CM | POA: Diagnosis not present

## 2024-01-14 DIAGNOSIS — Z7901 Long term (current) use of anticoagulants: Secondary | ICD-10-CM | POA: Diagnosis not present

## 2024-01-14 DIAGNOSIS — D649 Anemia, unspecified: Secondary | ICD-10-CM | POA: Diagnosis not present

## 2024-01-14 DIAGNOSIS — M1990 Unspecified osteoarthritis, unspecified site: Secondary | ICD-10-CM | POA: Diagnosis not present

## 2024-01-14 DIAGNOSIS — Z9181 History of falling: Secondary | ICD-10-CM | POA: Diagnosis not present

## 2024-01-14 DIAGNOSIS — Z7984 Long term (current) use of oral hypoglycemic drugs: Secondary | ICD-10-CM | POA: Diagnosis not present

## 2024-01-20 DIAGNOSIS — M109 Gout, unspecified: Secondary | ICD-10-CM | POA: Diagnosis not present

## 2024-01-20 DIAGNOSIS — I110 Hypertensive heart disease with heart failure: Secondary | ICD-10-CM | POA: Diagnosis not present

## 2024-01-20 DIAGNOSIS — E662 Morbid (severe) obesity with alveolar hypoventilation: Secondary | ICD-10-CM | POA: Diagnosis not present

## 2024-01-20 DIAGNOSIS — Z6841 Body Mass Index (BMI) 40.0 and over, adult: Secondary | ICD-10-CM | POA: Diagnosis not present

## 2024-01-20 DIAGNOSIS — D649 Anemia, unspecified: Secondary | ICD-10-CM | POA: Diagnosis not present

## 2024-01-20 DIAGNOSIS — I5032 Chronic diastolic (congestive) heart failure: Secondary | ICD-10-CM | POA: Diagnosis not present

## 2024-01-20 DIAGNOSIS — Z9181 History of falling: Secondary | ICD-10-CM | POA: Diagnosis not present

## 2024-01-20 DIAGNOSIS — Z7984 Long term (current) use of oral hypoglycemic drugs: Secondary | ICD-10-CM | POA: Diagnosis not present

## 2024-01-20 DIAGNOSIS — M1990 Unspecified osteoarthritis, unspecified site: Secondary | ICD-10-CM | POA: Diagnosis not present

## 2024-01-20 DIAGNOSIS — I480 Paroxysmal atrial fibrillation: Secondary | ICD-10-CM | POA: Diagnosis not present

## 2024-01-20 DIAGNOSIS — Z7901 Long term (current) use of anticoagulants: Secondary | ICD-10-CM | POA: Diagnosis not present

## 2024-02-16 DIAGNOSIS — I110 Hypertensive heart disease with heart failure: Secondary | ICD-10-CM | POA: Diagnosis not present

## 2024-02-18 DIAGNOSIS — K219 Gastro-esophageal reflux disease without esophagitis: Secondary | ICD-10-CM | POA: Diagnosis not present

## 2024-02-18 DIAGNOSIS — I10 Essential (primary) hypertension: Secondary | ICD-10-CM | POA: Diagnosis not present

## 2024-02-18 DIAGNOSIS — R2681 Unsteadiness on feet: Secondary | ICD-10-CM | POA: Diagnosis not present

## 2024-02-18 DIAGNOSIS — E782 Mixed hyperlipidemia: Secondary | ICD-10-CM | POA: Diagnosis not present

## 2024-02-18 DIAGNOSIS — I509 Heart failure, unspecified: Secondary | ICD-10-CM | POA: Diagnosis not present

## 2024-02-18 DIAGNOSIS — I110 Hypertensive heart disease with heart failure: Secondary | ICD-10-CM | POA: Diagnosis not present

## 2024-04-02 ENCOUNTER — Ambulatory Visit: Admitting: Nurse Practitioner

## 2024-04-16 ENCOUNTER — Ambulatory Visit: Payer: Self-pay | Admitting: Family Medicine

## 2024-07-27 ENCOUNTER — Ambulatory Visit: Payer: Self-pay
